# Patient Record
Sex: Male | Born: 1935
Health system: Southern US, Community
[De-identification: ages and names within clinical notes are randomized; demographics above are authoritative.]

## PROBLEM LIST (undated history)

## (undated) ENCOUNTER — Emergency Department (HOSPITAL_COMMUNITY): Discharge status: Absent without leave | Payer: Medicare Other

## (undated) DIAGNOSIS — I10 Essential (primary) hypertension: Secondary | ICD-10-CM

## (undated) DIAGNOSIS — I2510 Atherosclerotic heart disease of native coronary artery without angina pectoris: Secondary | ICD-10-CM

## (undated) DIAGNOSIS — I5022 Chronic systolic (congestive) heart failure: Secondary | ICD-10-CM

## (undated) DIAGNOSIS — I4819 Other persistent atrial fibrillation: Secondary | ICD-10-CM

## (undated) DIAGNOSIS — E785 Hyperlipidemia, unspecified: Secondary | ICD-10-CM

## (undated) DIAGNOSIS — Z9289 Personal history of other medical treatment: Secondary | ICD-10-CM

## (undated) DIAGNOSIS — J301 Allergic rhinitis due to pollen: Secondary | ICD-10-CM

## (undated) HISTORY — DX: Hyperlipidemia, unspecified: E78.5

## (undated) HISTORY — DX: Personal history of other medical treatment: Z92.89

## (undated) HISTORY — PX: CORONARY ANGIOPLASTY WITH STENT PLACEMENT: SHX49

---
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
## 2007-10-10 ENCOUNTER — Emergency Department (HOSPITAL_COMMUNITY): Admission: EM | Admit: 2007-10-10 | Discharge: 2007-10-10 | Payer: Self-pay | Admitting: Family Medicine

## 2010-12-20 ENCOUNTER — Ambulatory Visit: Payer: Self-pay | Admitting: Family Medicine

## 2011-01-04 ENCOUNTER — Ambulatory Visit (INDEPENDENT_AMBULATORY_CARE_PROVIDER_SITE_OTHER): Payer: Medicare Other | Admitting: Family Medicine

## 2011-01-04 ENCOUNTER — Encounter: Payer: Self-pay | Admitting: Family Medicine

## 2011-01-04 VITALS — BP 205/105 | HR 72 | Temp 98.0°F | Resp 12 | Ht 66.75 in | Wt 203.0 lb

## 2011-01-04 DIAGNOSIS — Z7189 Other specified counseling: Secondary | ICD-10-CM

## 2011-01-04 DIAGNOSIS — E785 Hyperlipidemia, unspecified: Secondary | ICD-10-CM

## 2011-01-04 DIAGNOSIS — R251 Tremor, unspecified: Secondary | ICD-10-CM

## 2011-01-04 DIAGNOSIS — R259 Unspecified abnormal involuntary movements: Secondary | ICD-10-CM

## 2011-01-04 DIAGNOSIS — I10 Essential (primary) hypertension: Secondary | ICD-10-CM

## 2011-01-04 LAB — LIPID PANEL
HDL: 60.8 mg/dL (ref >39.00)
Triglycerides: 75 mg/dL (ref 0.0–149.0)

## 2011-01-04 LAB — BASIC METABOLIC PANEL
CO2: 26 mEq/L (ref 19–32)
Calcium: 8.8 mg/dL (ref 8.4–10.5)
Sodium: 140 mEq/L (ref 135–145)

## 2011-01-04 LAB — TSH: TSH: 2.73 u[IU]/mL (ref 0.35–5.50)

## 2011-01-04 MED ORDER — LISINOPRIL-HYDROCHLOROTHIAZIDE 10-12.5 MG PO TABS: 1.0000 | ORAL_TABLET | Freq: Every day | ORAL | Status: DC

## 2011-01-04 NOTE — Patient Instructions (Signed)

## 2011-01-04 NOTE — Progress Notes (Signed)
  Subjective:    Patient ID: Ricardo Valencia, male    DOB: January 09, 1929, 77 y.o.   MRN: [id number]  HPI Patient is seen to establish care. Rarely sees a physician. Takes no medications. No prior surgeries other than appendectomy 1947. No known drug allergies.  Patient had fairly severe allergic reaction after exposure to hay about 2 months ago when visiting daughter. EMS was called. Had elevated blood pressure that time but never followed up. He is just now getting around to having followup. Never diagnosed with hypertension or treated for this. Denies any headaches or dizziness. No home blood pressure monitoring.  No lab work in several years apparently  History of tremor mostly involving upper extremities.  Present for several years.  Worse with fine motor skills.  ?family history of trremor.  Family history significant for father with colon cancer. Mother had breast cancer. Patient has never had colonoscopy screening. Drinks about 2-3 ounces of alcohol per night. Nonsmoker   Review of Systems  Constitutional: Negative for fever, chills, appetite change, fatigue and unexpected weight change.  HENT: Negative for congestion, sore throat, neck pain and neck stiffness.   Respiratory: Negative for cough and shortness of breath.   Cardiovascular: Negative for chest pain, palpitations and leg swelling.  Gastrointestinal: Negative for abdominal pain, diarrhea and constipation.  Genitourinary: Negative for dysuria.  Skin: Negative for rash.  Neurological: Positive for tremors. Negative for dizziness and headaches.  Hematological: Negative for adenopathy. Does not bruise/bleed easily.  Psychiatric/Behavioral: Negative for confusion and agitation.       Objective:   Physical Exam  Constitutional: He is oriented to person, place, and time. He appears well-developed and well-nourished. No distress.  HENT:  Right Ear: External ear normal.  Left Ear: External ear normal.  Mouth/Throat: Oropharynx  is clear and moist. No oropharyngeal exudate.  Eyes: Pupils are equal, round, and reactive to light.       Fundi benign. No hemorrhages or exudates  Neck: Neck supple. No thyromegaly present.  Cardiovascular: Normal rate and normal heart sounds.  Exam reveals no gallop.   Pulmonary/Chest: Effort normal and breath sounds normal. No respiratory distress. He has no wheezes. He has no rales.  Abdominal: Soft. He exhibits no distension. There is no tenderness. There is no rebound and no guarding.  Musculoskeletal: He exhibits no edema.  Lymphadenopathy:    He has no cervical adenopathy.  Neurological: He is alert and oriented to person, place, and time. No cranial nerve deficit.       Patient has resting tremor mostly upper extremities. No focal weakness  Skin: No rash noted.  Psychiatric: He has a normal mood and affect.          Assessment & Plan:  #1 new-onset hypertension. At least this has never been diagnosed previously. Start with lab work with basic metabolic panel, lipid panel, and TSH. Baseline EKG. Start lisinopril HCTZ 10/12.5 one daily and reassess 2 weeks. Dietary issues discussed. Alcohol in moderation. 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

## 2011-01-05 ENCOUNTER — Other Ambulatory Visit: Payer: Self-pay | Admitting: Family Medicine

## 2011-01-05 MED ORDER — ATORVASTATIN CALCIUM 20 MG PO TABS: 20.0000 mg | ORAL_TABLET | Freq: Every day | ORAL | Status: DC

## 2011-01-05 NOTE — Progress Notes (Signed)
Quick Note:  LMTCB, med sent to pt pharmacy ______

## 2011-01-09 NOTE — Progress Notes (Signed)
Quick Note:  Pt wife informed and she voiced her understanding. Will call for repeat lipid and hepatic panel in 6 weeks ______

## 2011-01-18 ENCOUNTER — Encounter: Payer: Self-pay | Admitting: Family Medicine

## 2011-01-18 ENCOUNTER — Ambulatory Visit (INDEPENDENT_AMBULATORY_CARE_PROVIDER_SITE_OTHER): Payer: Medicare Other | Admitting: Family Medicine

## 2011-01-18 DIAGNOSIS — E785 Hyperlipidemia, unspecified: Secondary | ICD-10-CM

## 2011-01-18 DIAGNOSIS — I10 Essential (primary) hypertension: Secondary | ICD-10-CM

## 2011-01-18 NOTE — Progress Notes (Signed)
  Subjective:    Patient ID: Ricardo Valencia, male    DOB: 1929-03-31, 77 y.o.   MRN: [id number]  HPI Patient seen for followup. Recently initiated care here with elevated blood pressure and fairly severe hyperlipidemia. Initiated Lipitor 20 mg daily and lisinopril HCTZ which he is tolerating with no side effects. Initially had some very mild lightheadedness which has resolved. No headaches or dizziness. No chest pains. No cough. Tolerating Lipitor with no myalgias. He has made diet changes with reduction of sodium and calories and has already lost about 7 pounds.   Review of Systems  Respiratory: Negative for cough and shortness of breath.   Cardiovascular: Negative for chest pain, palpitations and leg swelling.  Neurological: Negative for dizziness and headaches.       Objective:   Physical Exam  Constitutional: He appears well-developed and well-nourished.  Cardiovascular: Normal rate, regular rhythm and normal heart sounds.   Pulmonary/Chest: Effort normal and breath sounds normal. He has no wheezes. He has no rales.  Musculoskeletal: He exhibits no edema.          Assessment & Plan:  #1 hypertension improved. Not quite to goal but significant lifestyle changes. Reassess one month and titrate it if necessary. #2 hyperlipidemia. Recheck fasting lipid and hepatic at followup in one month

## 2011-02-22 ENCOUNTER — Ambulatory Visit: Payer: Medicare Other | Admitting: Family Medicine

## 2011-03-15 ENCOUNTER — Ambulatory Visit: Payer: Medicare Other | Admitting: Family Medicine

## 2011-04-05 ENCOUNTER — Ambulatory Visit: Payer: Medicare Other | Admitting: Family Medicine

## 2011-05-31 ENCOUNTER — Ambulatory Visit: Payer: Medicare Other | Admitting: Family Medicine

## 2011-07-05 ENCOUNTER — Ambulatory Visit: Payer: Medicare Other | Admitting: Family Medicine

## 2011-08-16 ENCOUNTER — Encounter: Payer: Self-pay | Admitting: Family Medicine

## 2011-08-16 ENCOUNTER — Ambulatory Visit (INDEPENDENT_AMBULATORY_CARE_PROVIDER_SITE_OTHER): Payer: Medicare Other | Admitting: Family Medicine

## 2011-08-16 VITALS — BP 160/90 | Temp 98.4°F | Wt 196.0 lb

## 2011-08-16 DIAGNOSIS — I10 Essential (primary) hypertension: Secondary | ICD-10-CM

## 2011-08-16 DIAGNOSIS — E785 Hyperlipidemia, unspecified: Secondary | ICD-10-CM

## 2011-08-16 MED ORDER — LISINOPRIL-HYDROCHLOROTHIAZIDE 10-12.5 MG PO TABS: 1.0000 | ORAL_TABLET | Freq: Every day | ORAL | Status: DC

## 2011-08-16 MED ORDER — ATORVASTATIN CALCIUM 20 MG PO TABS: 20.0000 mg | ORAL_TABLET | Freq: Every day | ORAL | Status: DC

## 2011-08-16 NOTE — Patient Instructions (Signed)
Get back on daily use of both medications. Let me know if you change your mind regarding flu vaccine.

## 2011-08-16 NOTE — Progress Notes (Signed)
  Subjective:    Patient ID: Ricardo Valencia, male    DOB: 1928-10-28, 77 y.o.   MRN: [id number]  HPI  Medical followup. Patient has not been compliant with medications. History of hyperlipidemia and hypertension. Stopped taking lisinopril HCTZ. Reportedly had some nausea though he had not reported  last visit. Has not taken in several months now. He is willing to consider repeat trial this time. Denies any cough or other side effects. Not clear why he quit taking Lipitor. Denied any side effects. No history of CAD or peripheral vascular disease. Nonsmoker. Denies any recent chest pains. Patient also takes aspirin and vitamin C. Denies any headaches or dizziness.  Past Medical History  Diagnosis Date  . Allergy     hay fever, allergies  . Hyperlipidemia    Past Surgical History  Procedure Date  . Appendectomy 1947    reports that he has never smoked. He does not have any smokeless tobacco history on file. His alcohol and drug histories not on file. family history includes Cancer in his father and mother. No Known Allergies    Review of Systems  Constitutional: Negative for fatigue.  HENT: Negative for trouble swallowing.   Eyes: Negative for visual disturbance.  Respiratory: Negative for cough, chest tightness and shortness of breath.   Cardiovascular: Negative for chest pain, palpitations and leg swelling.  Gastrointestinal: Negative for abdominal pain.  Genitourinary: Negative for dysuria.  Neurological: Negative for dizziness, syncope, weakness, light-headedness and headaches.       Objective:   Physical Exam  Constitutional: He is oriented to person, place, and time. He appears well-developed and well-nourished. No distress.  Neck: Neck supple. No thyromegaly present.  Cardiovascular: Normal rate and regular rhythm.   Pulmonary/Chest: Effort normal and breath sounds normal. No respiratory distress. He has no wheezes. He has no rales.  Musculoskeletal: He exhibits no  edema.  Neurological: He is alert and oriented to person, place, and time.  Psychiatric: He has a normal mood and affect.          Assessment & Plan:  #1 hypertension. Elevated today but patient not on medication. Get back on lisinopril HCTZ and reassess 6 weeks. If he has recurrent nausea discontinue medication and will start alternative medication #2 hyperlipidemia. Resume Lipitor 20 mg daily and repeat labs at followup  #3 health maintenance. Flu vaccine encouraged. Patient refuses.

## 2011-09-27 ENCOUNTER — Ambulatory Visit: Payer: Medicare Other | Admitting: Family Medicine

## 2011-10-18 ENCOUNTER — Ambulatory Visit: Payer: Medicare Other | Admitting: Family Medicine

## 2011-11-15 ENCOUNTER — Ambulatory Visit: Payer: Medicare Other | Admitting: Family Medicine

## 2011-12-13 ENCOUNTER — Ambulatory Visit: Payer: Medicare Other | Admitting: Family Medicine

## 2012-01-10 ENCOUNTER — Ambulatory Visit: Payer: Medicare Other | Admitting: Family Medicine

## 2012-02-14 ENCOUNTER — Ambulatory Visit: Payer: Medicare Other | Admitting: Family Medicine

## 2012-02-14 DIAGNOSIS — Z0289 Encounter for other administrative examinations: Secondary | ICD-10-CM

## 2014-01-07 ENCOUNTER — Ambulatory Visit (INDEPENDENT_AMBULATORY_CARE_PROVIDER_SITE_OTHER): Payer: Medicare Other | Admitting: Family Medicine

## 2014-01-07 DIAGNOSIS — Z23 Encounter for immunization: Secondary | ICD-10-CM

## 2014-11-07 ENCOUNTER — Inpatient Hospital Stay (HOSPITAL_COMMUNITY)
Admission: EM | Admit: 2014-11-07 | Discharge: 2014-11-12 | DRG: 246 | Disposition: A | Discharge status: Home / Self Care | Payer: Medicare Other | Attending: Cardiovascular Disease | Admitting: Cardiovascular Disease

## 2014-11-07 ENCOUNTER — Emergency Department (HOSPITAL_COMMUNITY): Payer: Medicare Other

## 2014-11-07 ENCOUNTER — Encounter (HOSPITAL_COMMUNITY): Payer: Self-pay | Admitting: *Deleted

## 2014-11-07 DIAGNOSIS — I472 Ventricular tachycardia: Secondary | ICD-10-CM | POA: Diagnosis not present

## 2014-11-07 DIAGNOSIS — Z955 Presence of coronary angioplasty implant and graft: Secondary | ICD-10-CM

## 2014-11-07 DIAGNOSIS — I4891 Unspecified atrial fibrillation: Secondary | ICD-10-CM | POA: Diagnosis not present

## 2014-11-07 DIAGNOSIS — I429 Cardiomyopathy, unspecified: Secondary | ICD-10-CM | POA: Diagnosis present

## 2014-11-07 DIAGNOSIS — J90 Pleural effusion, not elsewhere classified: Secondary | ICD-10-CM | POA: Diagnosis not present

## 2014-11-07 DIAGNOSIS — Z7982 Long term (current) use of aspirin: Secondary | ICD-10-CM

## 2014-11-07 DIAGNOSIS — R0602 Shortness of breath: Secondary | ICD-10-CM | POA: Diagnosis not present

## 2014-11-07 DIAGNOSIS — E785 Hyperlipidemia, unspecified: Secondary | ICD-10-CM | POA: Diagnosis present

## 2014-11-07 DIAGNOSIS — T82897A Other specified complication of cardiac prosthetic devices, implants and grafts, initial encounter: Secondary | ICD-10-CM | POA: Diagnosis not present

## 2014-11-07 DIAGNOSIS — I10 Essential (primary) hypertension: Secondary | ICD-10-CM | POA: Diagnosis not present

## 2014-11-07 DIAGNOSIS — I248 Other forms of acute ischemic heart disease: Secondary | ICD-10-CM | POA: Diagnosis not present

## 2014-11-07 DIAGNOSIS — I509 Heart failure, unspecified: Secondary | ICD-10-CM | POA: Diagnosis not present

## 2014-11-07 DIAGNOSIS — R7989 Other specified abnormal findings of blood chemistry: Secondary | ICD-10-CM | POA: Diagnosis not present

## 2014-11-07 DIAGNOSIS — I5021 Acute systolic (congestive) heart failure: Secondary | ICD-10-CM | POA: Diagnosis not present

## 2014-11-07 DIAGNOSIS — J811 Chronic pulmonary edema: Secondary | ICD-10-CM | POA: Diagnosis not present

## 2014-11-07 DIAGNOSIS — I340 Nonrheumatic mitral (valve) insufficiency: Secondary | ICD-10-CM | POA: Diagnosis present

## 2014-11-07 DIAGNOSIS — Y712 Prosthetic and other implants, materials and accessory cardiovascular devices associated with adverse incidents: Secondary | ICD-10-CM | POA: Diagnosis not present

## 2014-11-07 DIAGNOSIS — I2510 Atherosclerotic heart disease of native coronary artery without angina pectoris: Secondary | ICD-10-CM | POA: Diagnosis present

## 2014-11-07 DIAGNOSIS — Z9119 Patient's noncompliance with other medical treatment and regimen: Secondary | ICD-10-CM | POA: Diagnosis present

## 2014-11-07 DIAGNOSIS — Z79899 Other long term (current) drug therapy: Secondary | ICD-10-CM

## 2014-11-07 HISTORY — DX: Atherosclerotic heart disease of native coronary artery without angina pectoris: I25.10

## 2014-11-07 HISTORY — DX: Essential (primary) hypertension: I10

## 2014-11-07 LAB — CBC WITH DIFFERENTIAL/PLATELET
BASOS ABS: 0 10*3/uL (ref 0.0–0.1)
BASOS PCT: 0 % (ref 0–1)
EOS ABS: 0.1 10*3/uL (ref 0.0–0.7)
Eosinophils Relative: 2 % (ref 0–5)
HEMATOCRIT: 44.6 % (ref 39.0–52.0)
Hemoglobin: 15.1 g/dL (ref 13.0–17.0)
LYMPHS PCT: 10 % — AB (ref 12–46)
Lymphs Abs: 0.7 10*3/uL (ref 0.7–4.0)
MCH: 28.9 pg (ref 26.0–34.0)
MCHC: 33.9 g/dL (ref 30.0–36.0)
MCV: 85.4 fL (ref 78.0–100.0)
MONO ABS: 0.4 10*3/uL (ref 0.1–1.0)
MONOS PCT: 5 % (ref 3–12)
NEUTROS ABS: 5.5 10*3/uL (ref 1.7–7.7)
Neutrophils Relative %: 83 % — ABNORMAL HIGH (ref 43–77)
Platelets: 143 10*3/uL — ABNORMAL LOW (ref 150–400)
RBC: 5.22 MIL/uL (ref 4.22–5.81)
RDW: 14.4 % (ref 11.5–15.5)
WBC: 6.6 10*3/uL (ref 4.0–10.5)

## 2014-11-07 LAB — BASIC METABOLIC PANEL
ANION GAP: 7 (ref 5–15)
BUN: 8 mg/dL (ref 6–23)
CO2: 26 mmol/L (ref 19–32)
Calcium: 8.5 mg/dL (ref 8.4–10.5)
Chloride: 106 mmol/L (ref 96–112)
Creatinine, Ser: 1.24 mg/dL (ref 0.50–1.35)
GFR calc Af Amer: 60 mL/min — ABNORMAL LOW (ref >90)
GFR, EST NON AFRICAN AMERICAN: 52 mL/min — AB (ref >90)
GLUCOSE: 107 mg/dL — AB (ref 70–99)
POTASSIUM: 4 mmol/L (ref 3.5–5.1)
SODIUM: 139 mmol/L (ref 135–145)

## 2014-11-07 LAB — TROPONIN I
TROPONIN I: 0.06 ng/mL — AB (ref <0.031)
Troponin I: 0.08 ng/mL — ABNORMAL HIGH (ref <0.031)
Troponin I: 0.08 ng/mL — ABNORMAL HIGH (ref <0.031)

## 2014-11-07 LAB — TSH: TSH: 3.733 u[IU]/mL (ref 0.350–4.500)

## 2014-11-07 LAB — MAGNESIUM: Magnesium: 2 mg/dL (ref 1.5–2.5)

## 2014-11-07 LAB — HEPARIN LEVEL (UNFRACTIONATED): Heparin Unfractionated: 0.36 IU/mL (ref 0.30–0.70)

## 2014-11-07 LAB — APTT: APTT: 32 s (ref 24–37)

## 2014-11-07 LAB — PROTIME-INR
INR: 1.29 (ref 0.00–1.49)
PROTHROMBIN TIME: 16.2 s — AB (ref 11.6–15.2)

## 2014-11-07 LAB — BRAIN NATRIURETIC PEPTIDE: B Natriuretic Peptide: 464.5 pg/mL — ABNORMAL HIGH (ref 0.0–100.0)

## 2014-11-07 MED ORDER — VITAMIN C 250 MG PO TABS
250.0000 mg | ORAL_TABLET | Freq: Every day | ORAL | Status: DC
  Administered 2014-11-07 – 2014-11-12 (×6): 250 mg via ORAL
  Filled 2014-11-07 (×6): qty 1

## 2014-11-07 MED ORDER — VITAMIN C 100 MG PO TABS: 100.0000 mg | ORAL_TABLET | Freq: Every day | ORAL | Status: DC

## 2014-11-07 MED ORDER — DILTIAZEM HCL 25 MG/5ML IV SOLN
10.0000 mg | Freq: Once | INTRAVENOUS | Status: DC
  Filled 2014-11-07: qty 5

## 2014-11-07 MED ORDER — DILTIAZEM HCL 100 MG IV SOLR
5.0000 mg/h | INTRAVENOUS | Status: DC
  Administered 2014-11-07: 5 mg/h via INTRAVENOUS
  Filled 2014-11-07: qty 100

## 2014-11-07 MED ORDER — DILTIAZEM LOAD VIA INFUSION
10.0000 mg | Freq: Once | INTRAVENOUS | Status: AC
  Administered 2014-11-07: 10 mg via INTRAVENOUS
  Filled 2014-11-07: qty 10

## 2014-11-07 MED ORDER — HEPARIN (PORCINE) IN NACL 100-0.45 UNIT/ML-% IJ SOLN
1150.0000 [IU]/h | INTRAMUSCULAR | Status: DC
  Administered 2014-11-07: 1150 [IU]/h via INTRAVENOUS
  Filled 2014-11-07 (×2): qty 250

## 2014-11-07 MED ORDER — ONDANSETRON HCL 4 MG/2ML IJ SOLN
4.0000 mg | Freq: Four times a day (QID) | INTRAMUSCULAR | Status: DC | PRN
  Administered 2014-11-09: 4 mg via INTRAVENOUS
  Filled 2014-11-07: qty 2

## 2014-11-07 MED ORDER — DILTIAZEM HCL 60 MG PO TABS
60.0000 mg | ORAL_TABLET | Freq: Four times a day (QID) | ORAL | Status: DC
  Administered 2014-11-07 – 2014-11-08 (×3): 60 mg via ORAL
  Filled 2014-11-07 (×7): qty 1

## 2014-11-07 MED ORDER — HEPARIN BOLUS VIA INFUSION
4000.0000 [IU] | Freq: Once | INTRAVENOUS | Status: AC
  Administered 2014-11-07: 4000 [IU] via INTRAVENOUS
  Filled 2014-11-07: qty 4000

## 2014-11-07 MED ORDER — ACETAMINOPHEN 325 MG PO TABS: 650.0000 mg | ORAL_TABLET | ORAL | Status: DC | PRN

## 2014-11-07 MED ORDER — FUROSEMIDE 10 MG/ML IJ SOLN
20.0000 mg | Freq: Once | INTRAMUSCULAR | Status: AC
  Administered 2014-11-07: 20 mg via INTRAVENOUS
  Filled 2014-11-07: qty 2

## 2014-11-07 NOTE — ED Provider Notes (Signed)
Medical screening examination/treatment/procedure(s) were conducted as a shared visit with non-physician practitioner(s) and myself.  I personally evaluated the patient during the encounter.   EKG Interpretation   Date/Time:  Sunday November 07 2014 06:50:21 EST Ventricular Rate:  128 PR Interval:    QRS Duration: 95 QT Interval:  339 QTC Calculation: 495 R Axis:   98 Text Interpretation:  Age not entered, assumed to be  81 years old for  purpose of ECG interpretation Atrial fibrillation Borderline right axis  deviation Borderline low voltage, extremity leads Consider anterior  infarct No old tracing to compare Confirmed by Addilyne Backs,  DO, Careem Yasui ([id number])  on 11/07/2014 6:54:13 AM      Pt is a 81 y.o. male with history of hypertension, hyperlipidemia who presents to the emergency department shortness of breath that started yesterday worse with exertion worse with lying flat. Patient denies chest pain or chest discomfort. No fevers or cough. No lower extremity swelling or pain. On exam, patient is tachycardic, irregularly irregular, lung sounds are clear, abdomen soft, no lower extremity swelling or pain. His prior history of PE or DVT. No history of atrial fibrillation. States he is supposed to be a blood pressure medication has taken himself off. He is not anticoagulation. EKG shows atrial fibrillation with RVR.  He is also hypertensive. We'll obtain cardiac workup, chest x-ray. We'll start diltiazem drip and give aspirin. Anticipate admission.  [place], DO 11/07/14 [phone]

## 2014-11-07 NOTE — ED Notes (Addendum)
PA Tysinger at bedside with the patient.    Breakfast tray ordered for patient.

## 2014-11-07 NOTE — Progress Notes (Signed)
ANTICOAGULATION CONSULT NOTE - Initial Consult  Pharmacy Consult for Heparin Indication: atrial fibrillation  No Known Allergies  Patient Measurements: Height: 5\' 9"  (175.3 cm) Weight: 185 lb (83.915 kg) IBW/kg (Calculated) : 70.7  Vital Signs: Temp: 98.5 F (36.9 C) (02/21 0653) Temp Source: Oral (02/21 0653) BP: 106/82 mmHg (02/21 0915) Pulse Rate: 109 (02/21 0900)  Labs:  Recent Labs  11/07/14 0707  HGB 15.1  HCT 44.6  PLT 143*  CREATININE 1.24  TROPONINI 0.06*    Estimated Creatinine Clearance: 45.1 mL/min (by C-G formula based on Cr of 1.24).   Medical History: Past Medical History  Diagnosis Date  . Allergy     hay fever, allergies  . Hyperlipidemia   . Hypertension     Medications:  Infusions:  . diltiazem (CARDIZEM) infusion 15 mg/hr (11/07/14 0834)    Assessment: 81 year old male admitted with atrial fibrillation to begin anticoagulation with Heparin.  He is not taking any medications prior to admission.  Note his CHADS2-VASc score is 3 or 4 (depending on his EF) so I anticipate he will require long-term anticoagulation.  Goal of Therapy:  Heparin level 0.3-0.7 units/ml Monitor platelets by anticoagulation protocol: Yes   Plan:  Heparin 4000 units IV bolus Start Heparin infusion at 1150 units/hr Check heparin level in 8 hours Daily Heparin level and CBC Follow for long-term anticoagulation plans  Legrand Como, Pharm.D., BCPS, AAHIVP Clinical Pharmacist Phone: [phone] or [phone] 11/07/2014, 9:23 AM

## 2014-11-07 NOTE — Progress Notes (Signed)
Pharmacy: Re- heparin  Patient is an 81 y.o M on heparin for Afib.  First heparin level now back therapeutic at 0.36 (goal 0.3-0.7).  No bleeding documented.  Plan: - continue  Heparin drip at 1150 units/hr - f/u with AM labs and adjust rate if needed  Dia Sitter, PharmD, BCPS

## 2014-11-07 NOTE — ED Notes (Signed)
Pt arrived by [facility]. Reports sob since yesterday, had gotten progressively worse, difficulty lying down last night. afib rvr pta, rate 120-160. Given 324mg  asa pta.

## 2014-11-07 NOTE — H&P (Signed)
Patient ID: Ricardo Valencia MRN: [id number], DOB/AGE: 79-05-1929   Admit date: 11/07/2014   Primary Physician: Eulas Post, MD Primary Cardiologist: New  Pt. Profile:  Ricardo ARGUIJO is a 81 y.o. male with a history of HTN and HLD who presented to [facility] ED today with SOB and found to be in new onset afib with RVR.   Patient has a history of noncompliance and has not been seen by PCP and a couple years. He is not taking any medicines currently. He was in his usual state of health until yesterday when he started to feel mildly short of breath. This became worse and last night he could not sleep at all due to orthopnea and PND.  He also admits to fatigue and mild dizziness. No lower extremity swelling or chest pain. He has no history of exertional chest pain or shortness of breath. No family history of coronary artery disease. He has never been seen by cardiologist before.  In the emergency department he was noted to be in A. fib with RVR and started on diltiazem drip. CXR with pulm edema. BNP ~400s and troponin 0.06.    Problem List  Past Medical History  Diagnosis Date  . Allergy     hay fever, allergies  . Hyperlipidemia   . Hypertension     Past Surgical History  Procedure Laterality Date  . Appendectomy  1947     Allergies  No Known Allergies   Home Medications  Prior to Admission medications   Medication Sig Start Date End Date Taking? Authorizing Provider  Ascorbic Acid (VITAMIN C) 100 MG tablet Take 100 mg by mouth daily.     Yes Historical Provider, MD  aspirin 325 MG tablet Take 325 mg by mouth daily.     Yes Historical Provider, MD  atorvastatin (LIPITOR) 20 MG tablet Take 1 tablet (20 mg total) by mouth daily. Patient not taking: Reported on 11/07/2014 08/16/11 08/15/12  Eulas Post, MD  lisinopril-hydrochlorothiazide (PRINZIDE,ZESTORETIC) 10-12.5 MG per tablet Take 1 tablet by mouth daily. Patient not taking: Reported on 11/07/2014 08/16/11  08/15/12  Eulas Post, MD    Family History  Family History  Problem Relation Age of Onset  . Cancer Mother     breast  . Cancer Father     colon   Family Status  Relation Status Death Age  . Mother Deceased   . Father Deceased      Social History  History   Social History  . Marital Status: Married    Spouse Name: N/A  . Number of Children: N/A  . Years of Education: N/A   Occupational History  . Not on file.   Social History Main Topics  . Smoking status: Never Smoker   . Smokeless tobacco: Not on file  . Alcohol Use: Not on file  . Drug Use: Not on file  . Sexual Activity: Not on file   Other Topics Concern  . Not on file   Social History Narrative     All other systems reviewed and are otherwise negative except as noted above.  Physical Exam  Blood pressure 147/97, pulse 111, temperature 98.5 F (36.9 C), temperature source Oral, resp. rate 18, height 5\' 9"  (1.753 m), weight 185 lb (83.915 kg), SpO2 97 %.  General: Pleasant, NAD Psych: Normal affect. Neuro: Alert and oriented X 3. Moves all extremities spontaneously. HEENT: Normal  Neck: Supple without bruits or JVD. Lungs:  Mild crackles at bases  Heart: irreg irreg, tachy no s3, s4, or murmurs. Abdomen: Soft, non-tender, non-distended, BS + x 4.  Extremities: No clubbing, cyanosis or edema. DP/PT/Radials 2+ and equal bilaterally.  Labs   Recent Labs  11/07/14 0707  TROPONINI 0.06*   Lab Results  Component Value Date   WBC 6.6 11/07/2014   HGB 15.1 11/07/2014   HCT 44.6 11/07/2014   MCV 85.4 11/07/2014   PLT 143* 11/07/2014    Recent Labs Lab 11/07/14 0707  NA 139  K 4.0  CL 106  CO2 26  BUN 8  CREATININE 1.24  CALCIUM 8.5  GLUCOSE 107*     Radiology/Studies  Dg Chest 2 View  11/07/2014   CLINICAL DATA:  Hypertension.  Worsening shortness of Breath  EXAM: CHEST  2 VIEW  COMPARISON:  None.  FINDINGS: There is mild cardiac enlargement. Small bilateral pleural  effusions and mild interstitial edema is identified. Lung volumes are low.  IMPRESSION: Mild CHF.   Electronically Signed   By: Kerby Moors M.D.   On: 11/07/2014 07:57    ECG  HR 128 Atrial fibrillation Borderline right axis deviation Borderline low voltage, extremity leads  ASSESSMENT AND PLAN  Ricardo Valencia is a 81 y.o. male with a history of HTN and HLD who presented to [facility] ED today with SOB and found to be in new onset afib with RVR.   New onset afib with RVR- rates better controlled on dilt gtt -- Will start heparin gtt per pharmacy. He will require long term AC as his CHADSVASc score is at least 3 (HTN 1 age 58). Will order 2D ECHO to evaluated for CHF.  -- Hopefully he will spontaneously convert, but may need to consider TEE/DCCV if he remains symptomatic despite rate control .  -- Will order a TSH  Acute CHF- likely due to afib with RVR -- BNP slightly elevated at 465. CXR with mild CHF. Patient with SOB, orthopnea and PND. Will give one dose of 20mg  IV lasix and reassess later today.  -- Strict I/Os -- 2d ECHO when his HR is under control   Elevated troponin- mild. 0.06. Continue to cycle enzymes. -- Likely demand ischemia in the setting of afib with RVR but he does have RFs for CAD with HTN and HLD. May need ischemic work up at some point ( outpatient nuclear stress test)   HLD- will order a lipid panel   HTN- not on any meds at home. Continue dilt gtt for rate control .  Signed, Eileen Stanford, PA-C 11/07/2014, 8:30 AM  Pager [phone]  The patient was seen and examined, and I agree with the assessment and plan as documented above. 81 yr old male admitted with increasing dyspnea, orthopnea, and PND found to be in rapid atrial fibrillation. No known h/o heart or thyroid disease. Originally from [place] and moved to [place] 25 years ago. Currently works part time for [employer]. Now on diltiazem drip with rate in 90-110 bpm range. Will start  heparin and will eventually need factor Xa inhibitor. Obtain echocardiogram once HR is more optimized so as to assess LV systolic function, regional wall motion, and LA size. Consider TEE/DCCV if unable to control symptoms in spite of HR control.  Will give one time IV Lasix 20 and reassess.

## 2014-11-07 NOTE — ED Notes (Signed)
Pharmacy Tech at bedside with patient and family.

## 2014-11-07 NOTE — Progress Notes (Addendum)
    Was placed on dilt gtt at 60 in the ED. Dilt gtt ran out. Patient still in afib but HR in low 70s. WIll convert to po dilt 60mg  q6 for now.   Angelena Form PA-C  [facility]

## 2014-11-07 NOTE — ED Provider Notes (Signed)
CSN: [id number]     Arrival date & time 11/07/14  2683 History   First MD Initiated Contact with Patient 11/07/14 [phone]     Chief Complaint  Patient presents with  . Shortness of Breath   (Consider location/radiation/quality/duration/timing/severity/associated sxs/prior Treatment) HPI Ricardo Valencia is a 81 year old male presenting with report of shortness of breath. He states this first started yesterday morning he noticed he was more short of breath and this progressively worsened over the last 2 days. He states he originally thought shortness of breath was related to his musculoskeletal right arm pain that started after some strenuous yard work earlier in the week. Shortness of breath became worse last night when he was trying to sleep and the patient could not get comfortable or rest because of his difficulty breathing. He has a history of high blood pressure but has been avoiding regular medical checkups and has blood pressure medicines. EMS gave 324 ASA en route. He denies any chest pain, palpitations, diaphoresis, fever, or cough.   Past Medical History  Diagnosis Date  . Allergy     hay fever, allergies  . Hyperlipidemia   . Hypertension    Past Surgical History  Procedure Laterality Date  . Appendectomy  1947   Family History  Problem Relation Age of Onset  . Cancer Mother     breast  . Cancer Father     colon   History  Substance Use Topics  . Smoking status: Never Smoker   . Smokeless tobacco: Not on file  . Alcohol Use: Not on file    Review of Systems  Constitutional: Negative for fever and chills.  HENT: Negative for sore throat.   Eyes: Negative for visual disturbance.  Respiratory: Positive for shortness of breath. Negative for cough.   Cardiovascular: Negative for chest pain and leg swelling.  Gastrointestinal: Negative for nausea, vomiting and diarrhea.  Genitourinary: Negative for dysuria.  Musculoskeletal: Negative for myalgias.  Skin: Negative for  rash.  Neurological: Negative for weakness, numbness and headaches.    Allergies  Review of patient's allergies indicates no known allergies.  Home Medications   Prior to Admission medications   Medication Sig Start Date End Date Taking? Authorizing Provider  Ascorbic Acid (VITAMIN C) 100 MG tablet Take 100 mg by mouth daily.      Historical Provider, MD  aspirin 325 MG tablet Take 325 mg by mouth daily.      Historical Provider, MD  atorvastatin (LIPITOR) 20 MG tablet Take 1 tablet (20 mg total) by mouth daily. 08/16/11 08/15/12  Eulas Post, MD  lisinopril-hydrochlorothiazide (PRINZIDE,ZESTORETIC) 10-12.5 MG per tablet Take 1 tablet by mouth daily. 08/16/11 08/15/12  Eulas Post, MD   BP 164/100 mmHg  Pulse 123  Temp(Src) 98.5 F (36.9 C) (Oral)  Resp 27  SpO2 99% Physical Exam  Constitutional: He is oriented to person, place, and time. He appears well-developed and well-nourished. No distress.  HENT:  Head: Normocephalic and atraumatic.  Mouth/Throat: Oropharynx is clear and moist. No oropharyngeal exudate.  Eyes: Conjunctivae are normal.  Neck: Neck supple.  Cardiovascular: S1 normal, S2 normal and intact distal pulses.  An irregular rhythm present. Tachycardia present.  Exam reveals no gallop and no friction rub.   No murmur heard. Pulmonary/Chest: Effort normal. No respiratory distress. He has decreased breath sounds in the right lower field and the left lower field. He has no wheezes. He has no rhonchi. He has no rales. He exhibits no tenderness.  Abdominal:  Soft. There is no tenderness.  Musculoskeletal: He exhibits no tenderness.  Lymphadenopathy:    He has no cervical adenopathy.  Neurological: He is alert and oriented to person, place, and time. No cranial nerve deficit.  Skin: Skin is warm and dry. No rash noted. He is not diaphoretic.  Psychiatric: He has a normal mood and affect.  Nursing note and vitals reviewed.   ED Course  Procedures (including  critical care time) CRITICAL CARE Performed by: Britt Bottom   Total critical care time: 35 min  Critical care time was exclusive of separately billable procedures and treating other patients.  Critical care was necessary to treat or prevent imminent or life-threatening deterioration.  Critical care was time spent personally by me on the following activities: development of treatment plan with patient and/or surrogate as well as nursing, discussions with consultants, evaluation of patient's response to treatment, examination of patient, obtaining history from patient or surrogate, ordering and performing treatments and interventions, ordering and review of laboratory studies, ordering and review of radiographic studies, pulse oximetry and re-evaluation of patient's condition.   Labs Review Labs Reviewed  CBC WITH DIFFERENTIAL/PLATELET - Abnormal; Notable for the following:    Platelets 143 (*)    Neutrophils Relative % 83 (*)    Lymphocytes Relative 10 (*)    All other components within normal limits  BASIC METABOLIC PANEL - Abnormal; Notable for the following:    Glucose, Bld 107 (*)    GFR calc non Af Amer 52 (*)    GFR calc Af Amer 60 (*)    All other components within normal limits  TROPONIN I - Abnormal; Notable for the following:    Troponin I 0.06 (*)    All other components within normal limits  BRAIN NATRIURETIC PEPTIDE - Abnormal; Notable for the following:    B Natriuretic Peptide 464.5 (*)    All other components within normal limits    Imaging Review Dg Chest 2 View  11/07/2014   CLINICAL DATA:  Hypertension.  Worsening shortness of Breath  EXAM: CHEST  2 VIEW  COMPARISON:  None.  FINDINGS: There is mild cardiac enlargement. Small bilateral pleural effusions and mild interstitial edema is identified. Lung volumes are low.  IMPRESSION: Mild CHF.   Electronically Signed   By: Kerby Moors M.D.   On: 11/07/2014 07:57     EKG Interpretation   Date/Time:   Sunday November 07 2014 06:50:21 EST Ventricular Rate:  128 PR Interval:    QRS Duration: 95 QT Interval:  339 QTC Calculation: 495 R Axis:   98 Text Interpretation:  Age not entered, assumed to be  81 years old for  purpose of ECG interpretation Atrial fibrillation Borderline right axis  deviation Borderline low voltage, extremity leads Consider anterior  infarct No old tracing to compare Confirmed by WARD,  DO, KRISTEN ([id number])  on 11/07/2014 6:54:13 AM      MDM   Final diagnoses:  SOB (shortness of breath)  Atrial fibrillation with RVR   81 yo male with history of hypertension and hyperlipidemia, presenting with shortness of breath and afib with RVR on exam.  Discussed case with Dr. Leonides Schanz.  CBC, BMP, BNP, Troponin, EKG, CXR, Diltiazem bolus and gtt.    7:20 AM: On re-eval, pt reports shortness of breath has improved and he appears to be resting comfortably.  Heart rate is responding to diltiazem and is currently in low 100s.    8:00 AM: Labs and imaging reviewed: Troponin and BNP  elevated to 0.06 and 464.5 respectively.  CXR shows mild CHF.  Discussed all findings and plan with pt and family.    8:05 Consulted Dr. Bronson Ing (cardiology) regarding pt status for admission.  The patient appears reasonably stabilized for admission considering the current resources, flow, and capabilities available in the ED at this time, and I doubt any further screening and/or treatment in the ED prior to admission.    Filed Vitals:   11/07/14 0830 11/07/14 0845 11/07/14 0900 11/07/14 0915  BP: 132/94 149/86 147/81 106/82  Pulse: 95  109   Temp:      TempSrc:      Resp: 20 15 22 21   Height:      Weight:      SpO2: 98% 99% 98% 98%   Meds given in ED:  Medications  diltiazem (CARDIZEM) 1 mg/mL load via infusion 10 mg (10 mg Intravenous New Bag/Given 11/07/14 0703)    And  diltiazem (CARDIZEM) 100 mg in dextrose 5 % 100 mL (1 mg/mL) infusion (5 mg/hr Intravenous New Bag/Given 11/07/14 0702)     New Prescriptions   No medications on file       Britt Bottom, NP 11/07/14 1941

## 2014-11-08 DIAGNOSIS — I5031 Acute diastolic (congestive) heart failure: Secondary | ICD-10-CM | POA: Diagnosis not present

## 2014-11-08 DIAGNOSIS — I10 Essential (primary) hypertension: Secondary | ICD-10-CM | POA: Diagnosis not present

## 2014-11-08 DIAGNOSIS — I4891 Unspecified atrial fibrillation: Secondary | ICD-10-CM

## 2014-11-08 LAB — BASIC METABOLIC PANEL
ANION GAP: 10 (ref 5–15)
BUN: 8 mg/dL (ref 6–23)
CO2: 22 mmol/L (ref 19–32)
CREATININE: 1.07 mg/dL (ref 0.50–1.35)
Calcium: 8.1 mg/dL — ABNORMAL LOW (ref 8.4–10.5)
Chloride: 105 mmol/L (ref 96–112)
GFR calc Af Amer: 72 mL/min — ABNORMAL LOW (ref >90)
GFR calc non Af Amer: 62 mL/min — ABNORMAL LOW (ref >90)
Glucose, Bld: 104 mg/dL — ABNORMAL HIGH (ref 70–99)
Potassium: 3.6 mmol/L (ref 3.5–5.1)
Sodium: 137 mmol/L (ref 135–145)

## 2014-11-08 LAB — CBC
HCT: 40.1 % (ref 39.0–52.0)
Hemoglobin: 13.5 g/dL (ref 13.0–17.0)
MCH: 28.6 pg (ref 26.0–34.0)
MCHC: 33.7 g/dL (ref 30.0–36.0)
MCV: 85 fL (ref 78.0–100.0)
PLATELETS: 142 10*3/uL — AB (ref 150–400)
RBC: 4.72 MIL/uL (ref 4.22–5.81)
RDW: 14.5 % (ref 11.5–15.5)
WBC: 6.3 10*3/uL (ref 4.0–10.5)

## 2014-11-08 LAB — PROTIME-INR
INR: 1.32 (ref 0.00–1.49)
Prothrombin Time: 16.5 seconds — ABNORMAL HIGH (ref 11.6–15.2)

## 2014-11-08 LAB — LIPID PANEL
CHOL/HDL RATIO: 3.6 ratio
CHOLESTEROL: 195 mg/dL (ref 0–200)
HDL: 54 mg/dL (ref >39)
LDL CALC: 130 mg/dL — AB (ref 0–99)
Triglycerides: 54 mg/dL (ref <150)
VLDL: 11 mg/dL (ref 0–40)

## 2014-11-08 LAB — HEMOGLOBIN A1C
Hgb A1c MFr Bld: 5.4 % (ref 4.8–5.6)
Mean Plasma Glucose: 108 mg/dL

## 2014-11-08 LAB — TROPONIN I: Troponin I: 0.09 ng/mL — ABNORMAL HIGH (ref <0.031)

## 2014-11-08 LAB — HEPARIN LEVEL (UNFRACTIONATED): Heparin Unfractionated: 0.38 IU/mL (ref 0.30–0.70)

## 2014-11-08 MED ORDER — DILTIAZEM HCL ER COATED BEADS 240 MG PO CP24
240.0000 mg | ORAL_CAPSULE | Freq: Every day | ORAL | Status: DC
  Administered 2014-11-08 – 2014-11-12 (×5): 240 mg via ORAL
  Filled 2014-11-08 (×5): qty 1

## 2014-11-08 MED ORDER — APIXABAN 5 MG PO TABS
5.0000 mg | ORAL_TABLET | Freq: Two times a day (BID) | ORAL | Status: DC
  Administered 2014-11-08 (×2): 5 mg via ORAL
  Filled 2014-11-08 (×4): qty 1

## 2014-11-08 MED ORDER — FUROSEMIDE 10 MG/ML IJ SOLN
20.0000 mg | Freq: Once | INTRAMUSCULAR | Status: AC
  Administered 2014-11-08: 20 mg via INTRAVENOUS
  Filled 2014-11-08: qty 2

## 2014-11-08 MED ORDER — ATORVASTATIN CALCIUM 40 MG PO TABS
40.0000 mg | ORAL_TABLET | Freq: Every day | ORAL | Status: DC
  Administered 2014-11-09 – 2014-11-11 (×2): 40 mg via ORAL
  Filled 2014-11-08 (×5): qty 1

## 2014-11-08 MED ORDER — METOPROLOL SUCCINATE ER 25 MG PO TB24
25.0000 mg | ORAL_TABLET | Freq: Every day | ORAL | Status: DC
  Administered 2014-11-08 – 2014-11-09 (×2): 25 mg via ORAL
  Filled 2014-11-08 (×2): qty 1

## 2014-11-08 NOTE — Progress Notes (Signed)
Patient Profile: Ricardo Valencia is a 81 y.o. male with a history of HTN and HLD who presented to [facility] ED 11/07/14 with SOB and found to be in new onset afib with RVR and acute CHF. CHA2DS2 VASc score of 3 (HTN, Age > 75).    Subjective: Symptoms improved but still using supplemental O2 via [place].   Objective: Vital signs in last 24 hours: Temp:  [97.6 F (36.4 C)-98.4 F (36.9 C)] 98.1 F (36.7 C) (02/22 0603) Pulse Rate:  [61-123] 99 (02/22 0603) Resp:  [15-23] 20 (02/22 0603) BP: (106-149)/(57-97) 142/94 mmHg (02/22 0603) SpO2:  [97 %-99 %] 99 % (02/22 0603) Weight:  [184 lb 15.5 oz (83.9 kg)-201 lb 12.8 oz (91.536 kg)] 201 lb 12.8 oz (91.536 kg) (02/22 0603) Last BM Date: 11/07/14  Intake/Output from previous day: 02/21 0701 - 02/22 0700 In: 360 [P.O.:360] Out: 2050 [Urine:2050] Intake/Output this shift:    Medications Current Facility-Administered Medications  Medication Dose Route Frequency Provider Last Rate Last Dose  . acetaminophen (TYLENOL) tablet 650 mg  650 mg Oral Q4H PRN Eileen Stanford, PA-C      . diltiazem (CARDIZEM) tablet 60 mg  60 mg Oral 4 times per day Eileen Stanford, PA-C   60 mg at 11/08/14 0370  . heparin ADULT infusion 100 units/mL (25000 units/250 mL)  1,150 Units/hr Intravenous Continuous Norva Riffle, RPH 11.5 mL/hr at 11/07/14 1007 1,150 Units/hr at 11/07/14 1007  . ondansetron (ZOFRAN) injection 4 mg  4 mg Intravenous Q6H PRN Eileen Stanford, PA-C      . vitamin C (ASCORBIC ACID) tablet 250 mg  250 mg Oral Daily Herminio Commons, MD   250 mg at 11/07/14 1501    PE: General appearance: alert, cooperative and no distress Neck: no carotid bruit and no JVD Lungs: right sided basilar rales present Heart: irregularly irregular rhythm Extremities: trace bilateral LEE Pulses: 2+ and symmetric Skin: warm and dry Neurologic: Grossly normal  Lab Results:   Recent Labs  11/07/14 0707 11/08/14 0410  WBC 6.6 6.3  HGB 15.1 13.5    HCT 44.6 40.1  PLT 143* 142*   BMET  Recent Labs  11/07/14 0707 11/08/14 0410  NA 139 137  K 4.0 3.6  CL 106 105  CO2 26 22  GLUCOSE 107* 104*  BUN 8 8  CREATININE 1.24 1.07  CALCIUM 8.5 8.1*   PT/INR  Recent Labs  11/07/14 0707 11/08/14 0410  LABPROT 16.2* 16.5*  INR 1.29 1.32   Cholesterol  Recent Labs  11/08/14 0500  CHOL 195   Cardiac Panel (last 3 results)  Recent Labs  11/07/14 1622 11/07/14 2235 11/08/14 0410  TROPONINI 0.08* 0.08* 0.09*   Lipid Panel     Component Value Date/Time   CHOL 195 11/08/2014 0500   TRIG 54 11/08/2014 0500   HDL 54 11/08/2014 0500   CHOLHDL 3.6 11/08/2014 0500   VLDL 11 11/08/2014 0500   LDLCALC 130* 11/08/2014 0500   LDLDIRECT 191.1 01/04/2011 1218    Studies/Results: 2D echo- pending   Assessment/Plan  Active Problems:   Atrial fibrillation with rapid ventricular response  1. New onset afib with RVR- still in Afib but rate is better controlled in the 90s. --Continue PO Cardizem, 60 mg Q6H -- On IV heparin. He will require long term AC as his CHADSVASc score is at least 3 (HTN 1 age 7). Convert to NOAC today. Eliquis 5 mg BID may be best option. Scr is <1.5 and  weight is > 60 kg -- Plan on possible TEE Cardioversion this admission vs OP cardioversion after 3 weeks of uninterrupted anticoagulation, if no spontaneous conversion to NSR --2D echo pending --ACS less likely with flat troponin trend and no h/o chest pain --TSH normal    2. Acute CHF- likely due to afib with RVR -- BNP slightly elevated at 465. CXR with mild CHF. Patient with SOB, orthopnea and PND on admission. Was given 20 mg IV Lasix x 1. Good diuresis -2L out. Net negative -1.69. Symptoms improved but still with extra fluid with pulmonic rales and trace LEE.  -- Will give another dose of 20 mg IV Lasix x 1. Renal function, BP and electrolytes are ok. -- Strict I/Os -- 2d ECHO pending to assess LVF.    3. Elevated troponin- mild w/ flat  trend. 0.08 -->0.08--> 0.09 -- Likely demand ischemia in the setting of afib with RVR but he does have RFs for CAD with HTN and HLD. May need ischemic work up at some point (outpatient nuclear stress test)   4. HLD- fasting lipid shows elevated LDL of 130.  --Start Lipitor for LDL reduction.  --Check LFTs --Repeat fasting lipid and LFTs in 6 weeks  5. HTN- moderately elevated in the 364W systolic -- continue PO Cardizem -- Will possibly add a second agent. Choice will depend on echo finds.   [name] 11/08/2014 8:02 AM  I have personally seen and examined this patient with Lyda Jester, PA-CI agree with the assessment and plan as outlined above. He has new onset atrial fibrillation with uncertain duration. He is on heparin. Given CHADSVASC 3, will need long term anticoagulation. Will stop heparin drip today and start Eliquis 5 mg po BID. Rate is controlled with Cardizem. Will change to Cardizem CD 240 mg po Qdaily today and add Toprol 25 mg po Qdaily. Echo later today. IV Lasix for diuresis. Will likely plan 4 weeks of anticoagulation before DCCV as he seems to be symptomatically improved with rate control.   MCALHANY,CHRISTOPHER 11/08/2014 9:32 AM

## 2014-11-08 NOTE — Progress Notes (Signed)
  Echocardiogram 2D Echocardiogram has been performed.  Ricardo Valencia 11/08/2014, 11:55 AM

## 2014-11-08 NOTE — Progress Notes (Signed)
UR completed 

## 2014-11-09 DIAGNOSIS — I429 Cardiomyopathy, unspecified: Secondary | ICD-10-CM | POA: Diagnosis present

## 2014-11-09 DIAGNOSIS — I472 Ventricular tachycardia: Secondary | ICD-10-CM | POA: Diagnosis not present

## 2014-11-09 DIAGNOSIS — R7989 Other specified abnormal findings of blood chemistry: Secondary | ICD-10-CM | POA: Diagnosis not present

## 2014-11-09 DIAGNOSIS — I25119 Atherosclerotic heart disease of native coronary artery with unspecified angina pectoris: Secondary | ICD-10-CM | POA: Diagnosis not present

## 2014-11-09 DIAGNOSIS — Z7982 Long term (current) use of aspirin: Secondary | ICD-10-CM | POA: Diagnosis not present

## 2014-11-09 DIAGNOSIS — Z79899 Other long term (current) drug therapy: Secondary | ICD-10-CM | POA: Diagnosis not present

## 2014-11-09 DIAGNOSIS — I25110 Atherosclerotic heart disease of native coronary artery with unstable angina pectoris: Secondary | ICD-10-CM | POA: Diagnosis not present

## 2014-11-09 DIAGNOSIS — I420 Dilated cardiomyopathy: Secondary | ICD-10-CM | POA: Diagnosis not present

## 2014-11-09 DIAGNOSIS — T82897A Other specified complication of cardiac prosthetic devices, implants and grafts, initial encounter: Secondary | ICD-10-CM | POA: Diagnosis not present

## 2014-11-09 DIAGNOSIS — I2510 Atherosclerotic heart disease of native coronary artery without angina pectoris: Secondary | ICD-10-CM | POA: Diagnosis present

## 2014-11-09 DIAGNOSIS — I340 Nonrheumatic mitral (valve) insufficiency: Secondary | ICD-10-CM | POA: Diagnosis present

## 2014-11-09 DIAGNOSIS — E785 Hyperlipidemia, unspecified: Secondary | ICD-10-CM | POA: Diagnosis not present

## 2014-11-09 DIAGNOSIS — I5021 Acute systolic (congestive) heart failure: Secondary | ICD-10-CM | POA: Diagnosis not present

## 2014-11-09 DIAGNOSIS — I248 Other forms of acute ischemic heart disease: Secondary | ICD-10-CM | POA: Diagnosis not present

## 2014-11-09 DIAGNOSIS — I4891 Unspecified atrial fibrillation: Secondary | ICD-10-CM | POA: Diagnosis not present

## 2014-11-09 DIAGNOSIS — Y712 Prosthetic and other implants, materials and accessory cardiovascular devices associated with adverse incidents: Secondary | ICD-10-CM | POA: Diagnosis not present

## 2014-11-09 DIAGNOSIS — R0602 Shortness of breath: Secondary | ICD-10-CM | POA: Diagnosis not present

## 2014-11-09 DIAGNOSIS — Z9119 Patient's noncompliance with other medical treatment and regimen: Secondary | ICD-10-CM | POA: Diagnosis present

## 2014-11-09 DIAGNOSIS — I10 Essential (primary) hypertension: Secondary | ICD-10-CM | POA: Diagnosis not present

## 2014-11-09 LAB — BASIC METABOLIC PANEL
ANION GAP: 5 (ref 5–15)
BUN: 8 mg/dL (ref 6–23)
CALCIUM: 8.2 mg/dL — AB (ref 8.4–10.5)
CHLORIDE: 103 mmol/L (ref 96–112)
CO2: 28 mmol/L (ref 19–32)
Creatinine, Ser: 1.1 mg/dL (ref 0.50–1.35)
GFR calc Af Amer: 70 mL/min — ABNORMAL LOW (ref >90)
GFR calc non Af Amer: 60 mL/min — ABNORMAL LOW (ref >90)
Glucose, Bld: 113 mg/dL — ABNORMAL HIGH (ref 70–99)
POTASSIUM: 3.7 mmol/L (ref 3.5–5.1)
SODIUM: 136 mmol/L (ref 135–145)

## 2014-11-09 LAB — APTT
APTT: 34 s (ref 24–37)
aPTT: 51 seconds — ABNORMAL HIGH (ref 24–37)

## 2014-11-09 LAB — CBC
HCT: 40.9 % (ref 39.0–52.0)
Hemoglobin: 13.6 g/dL (ref 13.0–17.0)
MCH: 28.7 pg (ref 26.0–34.0)
MCHC: 33.3 g/dL (ref 30.0–36.0)
MCV: 86.3 fL (ref 78.0–100.0)
PLATELETS: 146 10*3/uL — AB (ref 150–400)
RBC: 4.74 MIL/uL (ref 4.22–5.81)
RDW: 14.5 % (ref 11.5–15.5)
WBC: 6.2 10*3/uL (ref 4.0–10.5)

## 2014-11-09 LAB — HEPATIC FUNCTION PANEL
ALBUMIN: 3.2 g/dL — AB (ref 3.5–5.2)
ALT: 19 U/L (ref 0–53)
AST: 19 U/L (ref 0–37)
Alkaline Phosphatase: 62 U/L (ref 39–117)
Bilirubin, Direct: 0.2 mg/dL (ref 0.0–0.5)
Indirect Bilirubin: 0.8 mg/dL (ref 0.3–0.9)
Total Bilirubin: 1 mg/dL (ref 0.3–1.2)
Total Protein: 5.6 g/dL — ABNORMAL LOW (ref 6.0–8.3)

## 2014-11-09 LAB — HEPARIN LEVEL (UNFRACTIONATED)

## 2014-11-09 MED ORDER — SODIUM CHLORIDE 0.9 % IJ SOLN
3.0000 mL | Freq: Two times a day (BID) | INTRAMUSCULAR | Status: DC
  Administered 2014-11-09 – 2014-11-10 (×2): 3 mL via INTRAVENOUS

## 2014-11-09 MED ORDER — ASPIRIN 81 MG PO CHEW
81.0000 mg | CHEWABLE_TABLET | ORAL | Status: AC
  Administered 2014-11-10: 81 mg via ORAL
  Filled 2014-11-09: qty 1

## 2014-11-09 MED ORDER — SODIUM CHLORIDE 0.9 % IJ SOLN: 3.0000 mL | INTRAMUSCULAR | Status: DC | PRN

## 2014-11-09 MED ORDER — SODIUM CHLORIDE 0.9 % IV SOLN
250.0000 mL | INTRAVENOUS | Status: DC | PRN
  Administered 2014-11-09: 250 mL via INTRAVENOUS

## 2014-11-09 MED ORDER — FUROSEMIDE 40 MG PO TABS
40.0000 mg | ORAL_TABLET | Freq: Every day | ORAL | Status: DC
  Administered 2014-11-09 – 2014-11-10 (×2): 40 mg via ORAL
  Filled 2014-11-09 (×3): qty 1

## 2014-11-09 MED ORDER — HEPARIN (PORCINE) IN NACL 100-0.45 UNIT/ML-% IJ SOLN
1350.0000 [IU]/h | INTRAMUSCULAR | Status: DC
  Administered 2014-11-09: 1150 [IU]/h via INTRAVENOUS
  Filled 2014-11-09 (×4): qty 250

## 2014-11-09 MED ORDER — OFF THE BEAT BOOK
Freq: Once | Status: AC
  Administered 2014-11-09: 09:00:00
  Filled 2014-11-09: qty 1

## 2014-11-09 MED ORDER — METOPROLOL SUCCINATE ER 50 MG PO TB24
50.0000 mg | ORAL_TABLET | Freq: Every day | ORAL | Status: DC
  Administered 2014-11-10 – 2014-11-12 (×3): 50 mg via ORAL
  Filled 2014-11-09 (×3): qty 1

## 2014-11-09 MED ORDER — SODIUM CHLORIDE 0.9 % IV SOLN
INTRAVENOUS | Status: AC
  Administered 2014-11-10: 04:00:00 via INTRAVENOUS

## 2014-11-09 NOTE — Progress Notes (Signed)
ANTICOAGULATION CONSULT NOTE - Follow Up Consult  Pharmacy Consult for Heparin Indication: atrial fibrillation  No Known Allergies  Patient Measurements: Height: 5\' 9"  (175.3 cm) Weight: 202 lb 9.6 oz (91.899 kg) IBW/kg (Calculated) : 70.7 Heparin Dosing Weight: 89 kg  Vital Signs: Temp: 98.4 F (36.9 C) (02/23 1406) Temp Source: Oral (02/23 1406) BP: 123/74 mmHg (02/23 1406) Pulse Rate: 75 (02/23 1406)  Labs:  Recent Labs  11/07/14 0707 11/07/14 1622 11/07/14 1821 11/07/14 2235 11/08/14 0410 11/09/14 0458 11/09/14 1105 11/09/14 1956  HGB 15.1  --   --   --  13.5 13.6  --   --   HCT 44.6  --   --   --  40.1 40.9  --   --   PLT 143*  --   --   --  142* 146*  --   --   APTT 32  --   --   --   --   --  34 51*  LABPROT 16.2*  --   --   --  16.5*  --   --   --   INR 1.29  --   --   --  1.32  --   --   --   HEPARINUNFRC  --   --  0.36  --  0.38  --  >2.20*  --   CREATININE 1.24  --   --   --  1.07 1.10  --   --   TROPONINI 0.06* 0.08*  --  0.08* 0.09*  --   --   --     Estimated Creatinine Clearance: 57 mL/min (by C-G formula based on Cr of 1.1).   Medications:  Heparin at 1150 units/hr (11.5 ml/hr)  Assessment: 66 YOM admitted with new onset afib (CHADSVASC = 3). He was initially started on IV heparin and then transitioned to eliquis on 2/22 and back to Heparin earlier today for plans for a cardiac cath. No bleeding noted per RN report.  Goal of Therapy:  Heparin level 0.3-0.7 units/ml aPTT 66-102 seconds Monitor platelets by anticoagulation protocol: Yes   Plan:  1. Increase Heparin to 1250 units/hr (12.5 ml/hr) 2. Will continue to monitor for any signs/symptoms of bleeding and will follow up with aPTT/HL level in 8 hours  Alycia Rossetti, PharmD, BCPS Clinical Pharmacist Pager: [phone] 11/09/2014 8:35 PM

## 2014-11-09 NOTE — Discharge Instructions (Addendum)
Stroke Prevention Some medical conditions and behaviors are associated with an increased chance of having a stroke. You may prevent a stroke by making healthy choices and managing medical conditions. HOW CAN I REDUCE MY RISK OF HAVING A STROKE?   Stay physically active. Get at least 30 minutes of activity on most or all days.  Do not smoke. It may also be helpful to avoid exposure to secondhand smoke.  Limit alcohol use. Moderate alcohol use is considered to be:  No more than 2 drinks per day for men.  No more than 1 drink per day for nonpregnant women.  Eat healthy foods. This involves:  Eating 5 or more servings of fruits and vegetables a day.  Making dietary changes that address high blood pressure (hypertension), high cholesterol, diabetes, or obesity.  Manage your cholesterol levels.  Making food choices that are high in fiber and low in saturated fat, trans fat, and cholesterol may control cholesterol levels.  Take any prescribed medicines to control cholesterol as directed by your health care provider.  Manage your diabetes.  Controlling your carbohydrate and sugar intake is recommended to manage diabetes.  Take any prescribed medicines to control diabetes as directed by your health care provider.  Control your hypertension.  Making food choices that are low in salt (sodium), saturated fat, trans fat, and cholesterol is recommended to manage hypertension.  Take any prescribed medicines to control hypertension as directed by your health care provider.  Maintain a healthy weight.  Reducing calorie intake and making food choices that are low in sodium, saturated fat, trans fat, and cholesterol are recommended to manage weight.  Stop drug abuse.  Avoid taking birth control pills.  Talk to your health care provider about the risks of taking birth control pills if you are over 7 years old, smoke, get migraines, or have ever had a blood clot.  Get evaluated for sleep  disorders (sleep apnea).  Talk to your health care provider about getting a sleep evaluation if you snore a lot or have excessive sleepiness.  Take medicines only as directed by your health care provider.  For some people, aspirin or blood thinners (anticoagulants) are helpful in reducing the risk of forming abnormal blood clots that can lead to stroke. If you have the irregular heart rhythm of atrial fibrillation, you should be on a blood thinner unless there is a good reason you cannot take them.  Understand all your medicine instructions.  Make sure that other conditions (such as anemia or atherosclerosis) are addressed. SEEK IMMEDIATE MEDICAL CARE IF:   You have sudden weakness or numbness of the face, arm, or leg, especially on one side of the body.  Your face or eyelid droops to one side.  You have sudden confusion.  You have trouble speaking (aphasia) or understanding.  You have sudden trouble seeing in one or both eyes.  You have sudden trouble walking.  You have dizziness.  You have a loss of balance or coordination.  You have a sudden, severe headache with no known cause.  You have new chest pain or an irregular heartbeat. Any of these symptoms may represent a serious problem that is an emergency. Do not wait to see if the symptoms will go away. Get medical help at once. Call your local emergency services (911 in [place] Do not drive yourself to the hospital. Document Released: 10/11/2004 Document Revised: 01/18/2014 Document Reviewed: 03/06/2013 [facility] Patient Information 2015 [place], [place]. This information is not intended to replace advice given  to you by your health care provider. Make sure you discuss any questions you have with your health care provider. Information on my medicine - ELIQUIS (apixaban)  This medication education was reviewed with me or my healthcare representative as part of my discharge preparation.  The pharmacist that spoke with me during my  hospital stay was:  Deboraha Sprang, [facility]  Why was Eliquis prescribed for you? Eliquis was prescribed for you to reduce the risk of a blood clot forming that can cause a stroke if you have a medical condition called atrial fibrillation (a type of irregular heartbeat).  What do You need to know about Eliquis ? Take your Eliquis TWICE DAILY - one tablet in the morning and one tablet in the evening with or without food. If you have difficulty swallowing the tablet whole please discuss with your pharmacist how to take the medication safely.  Take Eliquis exactly as prescribed by your doctor and DO NOT stop taking Eliquis without talking to the doctor who prescribed the medication.  Stopping may increase your risk of developing a stroke.  Refill your prescription before you run out.  After discharge, you should have regular check-up appointments with your healthcare provider that is prescribing your Eliquis.  In the future your dose may need to be changed if your kidney function or weight changes by a significant amount or as you get older.  What do you do if you miss a dose? If you miss a dose, take it as soon as you remember on the same day and resume taking twice daily.  Do not take more than one dose of ELIQUIS at the same time to make up a missed dose.  Important Safety Information A possible side effect of Eliquis is bleeding. You should call your healthcare provider right away if you experience any of the following: ? Bleeding from an injury or your nose that does not stop. ? Unusual colored urine (red or dark brown) or unusual colored stools (red or black). ? Unusual bruising for unknown reasons. ? A serious fall or if you hit your head (even if there is no bleeding).  Some medicines may interact with Eliquis and might increase your risk of bleeding or clotting while on Eliquis. To help avoid this, consult your healthcare provider or pharmacist prior to using any new  prescription or non-prescription medications, including herbals, vitamins, non-steroidal anti-inflammatory drugs (NSAIDs) and supplements.  This website has more information on Eliquis (apixaban): http://www.eliquis.com/eliquis/home Atrial Fibrillation Atrial fibrillation is a type of irregular heart rhythm (arrhythmia). During atrial fibrillation, the upper chambers of the heart (atria) quiver continuously in a chaotic pattern. This causes an irregular and often rapid heart rate.  Atrial fibrillation is the result of the heart becoming overloaded with disorganized signals that tell it to beat. These signals are normally released one at a time by a part of the right atrium called the sinoatrial node. They then travel from the atria to the lower chambers of the heart (ventricles), causing the atria and ventricles to contract and pump blood as they pass. In atrial fibrillation, parts of the atria outside of the sinoatrial node also release these signals. This results in two problems. First, the atria receive so many signals that they do not have time to fully contract. Second, the ventricles, which can only receive one signal at a time, beat irregularly and out of rhythm with the atria.  There are three types of atrial fibrillation:   Paroxysmal. Paroxysmal atrial fibrillation  starts suddenly and stops on its own within a week.  Persistent. Persistent atrial fibrillation lasts for more than a week. It may stop on its own or with treatment.  Permanent. Permanent atrial fibrillation does not go away. Episodes of atrial fibrillation may lead to permanent atrial fibrillation. Atrial fibrillation can prevent your heart from pumping blood normally. It increases your risk of stroke and can lead to heart failure.  CAUSES   Heart conditions, including a heart attack, heart failure, coronary artery disease, and heart valve conditions.   Inflammation of the sac that surrounds the heart  (pericarditis).  Blockage of an artery in the lungs (pulmonary embolism).  Pneumonia or other infections.  Chronic lung disease.  Thyroid problems, especially if the thyroid is overactive (hyperthyroidism).  Caffeine, excessive alcohol use, and use of some illegal drugs.   Use of some medicines, including certain decongestants and diet pills.  Heart surgery.   Birth defects.  Sometimes, no cause can be found. When this happens, the atrial fibrillation is called lone atrial fibrillation. The risk of complications from atrial fibrillation increases if you have lone atrial fibrillation and you are age 60 years or older. RISK FACTORS  Heart failure.  Coronary artery disease.  Diabetes mellitus.   High blood pressure (hypertension).   Obesity.   Other arrhythmias.   Increased age. SIGNS AND SYMPTOMS   A feeling that your heart is beating rapidly or irregularly.   A feeling of discomfort or pain in your chest.   Shortness of breath.   Sudden light-headedness or weakness.   Getting tired easily when exercising.   Urinating more often than normal (mainly when atrial fibrillation first begins).  In paroxysmal atrial fibrillation, symptoms may start and suddenly stop. DIAGNOSIS  Your health care provider may be able to detect atrial fibrillation when taking your pulse. Your health care provider may have you take a test called an ambulatory electrocardiogram (ECG). An ECG records your heartbeat patterns over a 24-hour period. You may also have other tests, such as:  Transthoracic echocardiogram (TTE). During echocardiography, sound waves are used to evaluate how blood flows through your heart.  Transesophageal echocardiogram (TEE).  Stress test. There is more than one type of stress test. If a stress test is needed, ask your health care provider about which type is best for you.  Chest X-ray exam.  Blood tests.  Computed tomography (CT). TREATMENT   Treatment may include:  Treating any underlying conditions. For example, if you have an overactive thyroid, treating the condition may correct atrial fibrillation.  Taking medicine. Medicines may be given to control a rapid heart rate or to prevent blood clots, heart failure, or a stroke.  Having a procedure to correct the rhythm of the heart:  Electrical cardioversion. During electrical cardioversion, a controlled, low-energy shock is delivered to the heart through your skin. If you have chest pain, very low blood pressure, or sudden heart failure, this procedure may need to be done as an emergency.  Catheter ablation. During this procedure, heart tissues that send the signals that cause atrial fibrillation are destroyed.  Surgical ablation. During this surgery, thin lines of heart tissue that carry the abnormal signals are destroyed. This procedure can either be an open-heart surgery or a minimally invasive surgery. With the minimally invasive surgery, small cuts are made to access the heart instead of a large opening.  Pulmonary venous isolation. During this surgery, tissue around the veins that carry blood from the lungs (pulmonary veins) is destroyed.  This tissue is thought to carry the abnormal signals. HOME CARE INSTRUCTIONS   Take medicines only as directed by your health care provider. Some medicines can make atrial fibrillation worse or recur.  If blood thinners were prescribed by your health care provider, take them exactly as directed. Too much blood-thinning medicine can cause bleeding. If you take too little, you will not have the needed protection against stroke and other problems.  Perform blood tests at home if directed by your health care provider. Perform blood tests exactly as directed.  Quit smoking if you smoke.  Do not drink alcohol.  Do not drink caffeinated beverages such as coffee, soda, and some teas. You may drink decaffeinated coffee, soda, or tea.    Maintain a healthy weight.Do not use diet pills unless your health care provider approves. They may make heart problems worse.   Follow diet instructions as directed by your health care provider.  Exercise regularly as directed by your health care provider.  Keep all follow-up visits as directed by your health care provider. This is important. PREVENTION  The following substances can cause atrial fibrillation to recur:   Caffeinated beverages.  Alcohol.  Certain medicines, especially those used for breathing problems.  Certain herbs and herbal medicines, such as those containing ephedra or ginseng.  Illegal drugs, such as cocaine and amphetamines. Sometimes medicines are given to prevent atrial fibrillation from recurring. Proper treatment of any underlying condition is also important in helping prevent recurrence.  SEEK MEDICAL CARE IF:  You notice a change in the rate, rhythm, or strength of your heartbeat.  You suddenly begin urinating more frequently.  You tire more easily when exerting yourself or exercising. SEEK IMMEDIATE MEDICAL CARE IF:   You have chest pain, abdominal pain, sweating, or weakness.  You feel nauseous.  You have shortness of breath.  You suddenly have swollen feet and ankles.  You feel dizzy.  Your face or limbs feel numb or weak.  You have a change in your vision or speech. MAKE SURE YOU:   Understand these instructions.  Will watch your condition.  Will get help right away if you are not doing well or get worse. Document Released: 09/03/2005 Document Revised: 01/18/2014 Document Reviewed: 10/14/2012 [facility] Patient Information 2015 [place], [place]. This information is not intended to replace advice given to you by your health care provider. Make sure you discuss any questions you have with your health care provider.

## 2014-11-09 NOTE — Progress Notes (Signed)
Patient Profile: Ricardo Valencia is a 81 y.o. male with a history of HTN and HLD who presented to [facility] ED 11/07/14 with SOB and found to be in new onset afib with RVR and acute CHF. CHA2DS2 VASc score of 3 (HTN, Age > 75).   Subjective: Asymptomatic. Denies CP, palpitations, dyspnea. Denies past h/o CP.   Objective: Vital signs in last 24 hours: Temp:  [97.8 F (36.6 C)-98.1 F (36.7 C)] 98.1 F (36.7 C) (02/23 0547) Pulse Rate:  [80-98] 80 (02/23 0547) Resp:  [18-20] 18 (02/23 0547) BP: (126-145)/(75-88) 126/75 mmHg (02/23 0547) SpO2:  [95 %-97 %] 95 % (02/23 0547) Weight:  [202 lb 9.6 oz (91.899 kg)] 202 lb 9.6 oz (91.899 kg) (02/23 0547) Last BM Date: 11/07/14  Intake/Output from previous day: 02/22 0701 - 02/23 0700 In: 720 [P.O.:720] Out: 300 [Urine:300] Intake/Output this shift:    Medications Current Facility-Administered Medications  Medication Dose Route Frequency Provider Last Rate Last Dose  . acetaminophen (TYLENOL) tablet 650 mg  650 mg Oral Q4H PRN Eileen Stanford, PA-C      . apixaban Arne Cleveland) tablet 5 mg  5 mg Oral BID Burnell Blanks, MD   5 mg at 11/08/14 2112  . atorvastatin (LIPITOR) tablet 40 mg  40 mg Oral q1800 Brittainy M Simmons, PA-C      . diltiazem (CARDIZEM CD) 24 hr capsule 240 mg  240 mg Oral Daily Burnell Blanks, MD   240 mg at 11/08/14 1030  . metoprolol succinate (TOPROL-XL) 24 hr tablet 25 mg  25 mg Oral Daily Burnell Blanks, MD   25 mg at 11/08/14 1030  . off the beat book   Does not apply Once Louie Bun, Therapist, sports      . ondansetron (ZOFRAN) injection 4 mg  4 mg Intravenous Q6H PRN Eileen Stanford, PA-C   4 mg at 11/09/14 0234  . vitamin C (ASCORBIC ACID) tablet 250 mg  250 mg Oral Daily Herminio Commons, MD   250 mg at 11/08/14 1000    PE: General appearance: alert, cooperative and no distress Neck: no carotid bruit and no JVD Lungs:CTAB Heart: irregularly irregular rhythm Extremities: No LEE Pulses:  2+ and symmetric Skin: warm and dry Neurologic: Grossly normal  Lab Results:   Recent Labs  11/07/14 0707 11/08/14 0410 11/09/14 0458  WBC 6.6 6.3 6.2  HGB 15.1 13.5 13.6  HCT 44.6 40.1 40.9  PLT 143* 142* 146*   BMET  Recent Labs  11/07/14 0707 11/08/14 0410 11/09/14 0458  NA 139 137 136  K 4.0 3.6 3.7  CL 106 105 103  CO2 26 22 28   GLUCOSE 107* 104* 113*  BUN 8 8 8   CREATININE 1.24 1.07 1.10  CALCIUM 8.5 8.1* 8.2*   PT/INR  Recent Labs  11/07/14 0707 11/08/14 0410  LABPROT 16.2* 16.5*  INR 1.29 1.32   Cholesterol  Recent Labs  11/08/14 0500  CHOL 195   Cardiac Panel (last 3 results)  Recent Labs  11/07/14 1622 11/07/14 2235 11/08/14 0410  TROPONINI 0.08* 0.08* 0.09*   Lipid Panel     Component Value Date/Time   CHOL 195 11/08/2014 0500   TRIG 54 11/08/2014 0500   HDL 54 11/08/2014 0500   CHOLHDL 3.6 11/08/2014 0500   VLDL 11 11/08/2014 0500   LDLCALC 130* 11/08/2014 0500   LDLDIRECT 191.1 01/04/2011 1218    Studies/Results: 2D echo- 11/08/14 Study Conclusions  - Left ventricle: The cavity size was normal.  Wall thickness was increased in a pattern of mild LVH. Systolic function was severely reduced. The estimated ejection fraction was in the range of 25% to 30%. Diffuse hypokinesis. - Aortic valve: There was trivial regurgitation. - Mitral valve: There was mild to moderate regurgitation. - Left atrium: The atrium was mildly dilated. - Right atrium: The atrium was mildly dilated.  Impressions:  - Severe global reduction in LV function; mild LAE; mild to moderate MR; trace AI.  Assessment/Plan  Active Problems:   Atrial fibrillation with rapid ventricular response  1. New onset afib with RVR- still in Afib but rate is better controlled in the 90s. -- Given low EF, recommend discontinuation of Cardizem. Can use Toprol XL or Coreg.  He will require long term AC as his CHADSVASc score is at least 3 (HTN 1 age 72).  Continue Eliquis 5 mg BID  -- Plan on 3 weeks of uninterrupted anticoagulation with Eliquis and OP Cardioversion, if no spontaneous conversion to  NSR --2D echo shows low EF of 25-30% --ACS less likely with flat troponin trend and no h/o chest pain --TSH normal    2. Acute CHF- improved. likely due to afib with RVR -- BNP slightly elevated at 465 on admit. CXR with mild CHF on admit.  -- Now euvolemic on physical exam -- With low EF, will need low dose PO Lasix to prevent recurrent acute exacerbations  3. Systolic Dysfunction: - severe. Echo shows EF of 25-30%  -- this may possibly be tachy medicated from rapid Atrial fibrillation, however cannot fully r/o ischemia. He will need an ischemic evaluation, either NST vs LHC.  -- Change Cardizem to BB for HF. -- Add ACE/ARB if BP tolerates.   3. Elevated troponin- mild w/ flat trend. 0.08 -->0.08--> 0.09 -- Likely demand ischemia in the setting of afib with RVR but he does have RFs for CAD with HTN and HLD. With seve reduction in EF to 25-30%, he will need ischemic eval  4. HLD- fasting lipid shows elevated LDL of 130.  --Start Lipitor for LDL reduction.  -- LFTs are ok --Repeat fasting lipid and LFTs in 6 weeks  5. HTN- Controlled at 126/75 -- continue PO Cardizem -- Will possibly add a second agent. Choice will depend on echo finds.   [name] 11/09/2014 8:37 AM   I have personally seen and examined this patient with Ricardo Jester, PA-C. I agree with the assessment and plan as outlined above. He presented with atrial fib with RVR. Rate is now controlled on Cardizem and Toprol. Will ultimately try to transition to beta blocker alone but will continue Cardizem today. Increase Toprol to 50 mg daily. Cardiomyopathy may be tachycardia mediated vs other including obstructive CAD vs idiopathic. Troponin mildly elevated with flat trend. This may be due to demand ischemia from his elevated HR. I have discussed cardiac cath  to define coronary anatomy. This will be best done as an inpatient before he has been established on Eliquis. Will arrange cardiac cath tomorrow (left heart cath only). Risks and benefits reviewed with pt. Will stop Eliquis. Last dose last night. Start heparin per pharmacy today while awaiting cardiac cath and plan to resume Eliquis post cath. NPO at midnight.  MCALHANY,CHRISTOPHER 10:27 AM 11/09/2014

## 2014-11-09 NOTE — Progress Notes (Signed)
UR completed 

## 2014-11-09 NOTE — Progress Notes (Signed)
ANTICOAGULATION CONSULT NOTE - Follow Up Consult  Pharmacy Consult for Eliquis-->Heparin Indication: atrial fibrillation  No Known Allergies  Patient Measurements: Height: 5\' 9"  (175.3 cm) Weight: 202 lb 9.6 oz (91.899 kg) IBW/kg (Calculated) : 70.7 Heparin Dosing Weight: ~89kg  Vital Signs: Temp: 98.1 F (36.7 C) (02/23 0547) Temp Source: Oral (02/23 0547) BP: 128/83 mmHg (02/23 1005) Pulse Rate: 71 (02/23 1005)  Labs:  Recent Labs  11/07/14 0707 11/07/14 1622 11/07/14 1821 11/07/14 2235 11/08/14 0410 11/09/14 0458  HGB 15.1  --   --   --  13.5 13.6  HCT 44.6  --   --   --  40.1 40.9  PLT 143*  --   --   --  142* 146*  APTT 32  --   --   --   --   --   LABPROT 16.2*  --   --   --  16.5*  --   INR 1.29  --   --   --  1.32  --   HEPARINUNFRC  --   --  0.36  --  0.38  --   CREATININE 1.24  --   --   --  1.07 1.10  TROPONINI 0.06* 0.08*  --  0.08* 0.09*  --     Estimated Creatinine Clearance: 57 mL/min (by C-G formula based on Cr of 1.1).  Assessment: 85yom admitted with new onset afib (CHADSVASC = 3). He was initially started on IV heparin and then transitioned to eliquis yesterday. He now needs a cardiac cath to help determine the etiology of his afib so eliquis will be transitioned back to IV heparin. Last eliquis dose 2/22 @ 2112. Heparin was previously therapeutic on 1150 units/hr. Will  Use aPTTs to guide initial heparin dosing as eliquis falsely elevates heparin levels. Plan is for cath tomorrow.  Goal of Therapy:  APTT 66-102 seconds Heparin level 0.3-0.7 units/ml Monitor platelets by anticoagulation protocol: Yes   Plan:  1) Obtain baseline aPTT and heparin level 2) Begin heparin at 1150 units/hr with NO bolus 3) Check 8 hour aPTT  Deboraha Sprang 11/09/2014,10:37 AM

## 2014-11-09 NOTE — Progress Notes (Signed)
CARE MANAGEMENT NOTE 11/09/2014  Patient:  Ricardo Valencia, Ricardo Valencia   Account Number:  [id number]  Date Initiated:  11/08/2014  Documentation initiated by:  Marvetta Gibbons  Subjective/Objective Assessment:   Pt admitted with afib     Action/Plan:   PTA pt lived at  home with wife   Anticipated DC Date:  11/09/2014   Anticipated DC Plan:  [place]  CM consult  Medication Assistance      Choice offered to / List presented to:             Status of service:  In process, will continue to follow Medicare Important Message given?   (If response is "NO", the following Medicare IM given date fields will be blank) Date Medicare IM given:   Medicare IM given by:   Date Additional Medicare IM given:   Additional Medicare IM given by:    Discharge Disposition:    Per UR Regulation:  Reviewed for med. necessity/level of care/duration of stay  If discussed at [place] of Stay Meetings, dates discussed:    Comments:  11/08/14- 1700- Marvetta Gibbons RN, BSN [phone] Referral for Eliquis benefit check- attempted check- could not find pt's drug coverage- spoke with pt at bedside- at see if pt had info on his drug coverage- per pt he does have drug coverage benefits but does not have the info or card on him here at hospital- pt states that he would prefer to f/u on this when he gets home- instructed pt on the importance of finding out coverage copay cost and if any MD authorization needed for Eliquis- pt reports that he will f/u on this with his insurance and MD office- pt given 30 day free card for Eliquis.

## 2014-11-10 ENCOUNTER — Encounter (HOSPITAL_COMMUNITY)
Admission: EM | Disposition: A | Discharge status: Home / Self Care | Payer: Medicare Other | Source: Home / Self Care | Attending: Cardiovascular Disease

## 2014-11-10 ENCOUNTER — Encounter (HOSPITAL_COMMUNITY): Payer: Self-pay | Admitting: Cardiovascular Disease

## 2014-11-10 DIAGNOSIS — I2510 Atherosclerotic heart disease of native coronary artery without angina pectoris: Secondary | ICD-10-CM

## 2014-11-10 HISTORY — PX: PERCUTANEOUS CORONARY STENT INTERVENTION (PCI-S): SHX5485

## 2014-11-10 HISTORY — PX: LEFT HEART CATHETERIZATION WITH CORONARY ANGIOGRAM: SHX5451

## 2014-11-10 LAB — CBC
HEMATOCRIT: 41.4 % (ref 39.0–52.0)
Hemoglobin: 13.9 g/dL (ref 13.0–17.0)
MCH: 28.3 pg (ref 26.0–34.0)
MCHC: 33.6 g/dL (ref 30.0–36.0)
MCV: 84.3 fL (ref 78.0–100.0)
PLATELETS: 151 10*3/uL (ref 150–400)
RBC: 4.91 MIL/uL (ref 4.22–5.81)
RDW: 14.4 % (ref 11.5–15.5)
WBC: 6.2 10*3/uL (ref 4.0–10.5)

## 2014-11-10 LAB — PROTIME-INR
INR: 1.29 (ref 0.00–1.49)
Prothrombin Time: 16.2 seconds — ABNORMAL HIGH (ref 11.6–15.2)

## 2014-11-10 LAB — BASIC METABOLIC PANEL
ANION GAP: 7 (ref 5–15)
BUN: 10 mg/dL (ref 6–23)
CALCIUM: 8.3 mg/dL — AB (ref 8.4–10.5)
CO2: 25 mmol/L (ref 19–32)
CREATININE: 1.19 mg/dL (ref 0.50–1.35)
Chloride: 103 mmol/L (ref 96–112)
GFR, EST AFRICAN AMERICAN: 63 mL/min — AB (ref >90)
GFR, EST NON AFRICAN AMERICAN: 55 mL/min — AB (ref >90)
Glucose, Bld: 95 mg/dL (ref 70–99)
Potassium: 3.8 mmol/L (ref 3.5–5.1)
Sodium: 135 mmol/L (ref 135–145)

## 2014-11-10 LAB — HEPARIN LEVEL (UNFRACTIONATED): HEPARIN UNFRACTIONATED: 1.82 [IU]/mL — AB (ref 0.30–0.70)

## 2014-11-10 LAB — APTT: aPTT: 58 seconds — ABNORMAL HIGH (ref 24–37)

## 2014-11-10 LAB — POCT ACTIVATED CLOTTING TIME: Activated Clotting Time: 521 seconds

## 2014-11-10 SURGERY — LEFT HEART CATHETERIZATION WITH CORONARY ANGIOGRAM

## 2014-11-10 MED ORDER — VERAPAMIL HCL 2.5 MG/ML IV SOLN
INTRAVENOUS | Status: AC
  Filled 2014-11-10: qty 2

## 2014-11-10 MED ORDER — APIXABAN 5 MG PO TABS
5.0000 mg | ORAL_TABLET | Freq: Two times a day (BID) | ORAL | Status: DC
  Administered 2014-11-11 – 2014-11-12 (×3): 5 mg via ORAL
  Filled 2014-11-10 (×4): qty 1

## 2014-11-10 MED ORDER — SODIUM CHLORIDE 0.9 % IV SOLN: INTRAVENOUS | Status: AC

## 2014-11-10 MED ORDER — FENTANYL CITRATE 0.05 MG/ML IJ SOLN
INTRAMUSCULAR | Status: AC
  Filled 2014-11-10: qty 2

## 2014-11-10 MED ORDER — CLOPIDOGREL BISULFATE 75 MG PO TABS
75.0000 mg | ORAL_TABLET | Freq: Every day | ORAL | Status: DC
  Administered 2014-11-11 – 2014-11-12 (×2): 75 mg via ORAL
  Filled 2014-11-10 (×2): qty 1

## 2014-11-10 MED ORDER — MIDAZOLAM HCL 2 MG/2ML IJ SOLN
INTRAMUSCULAR | Status: AC
  Filled 2014-11-10: qty 2

## 2014-11-10 MED ORDER — ASPIRIN 81 MG PO CHEW
CHEWABLE_TABLET | ORAL | Status: AC
  Filled 2014-11-10: qty 3

## 2014-11-10 MED ORDER — HEPARIN (PORCINE) IN NACL 2-0.9 UNIT/ML-% IJ SOLN
INTRAMUSCULAR | Status: AC
  Filled 2014-11-10: qty 1000

## 2014-11-10 MED ORDER — HEPARIN SODIUM (PORCINE) 1000 UNIT/ML IJ SOLN
INTRAMUSCULAR | Status: AC
  Filled 2014-11-10: qty 1

## 2014-11-10 MED ORDER — BIVALIRUDIN 250 MG IV SOLR
INTRAVENOUS | Status: AC
  Filled 2014-11-10: qty 250

## 2014-11-10 MED ORDER — LIDOCAINE HCL (PF) 1 % IJ SOLN
INTRAMUSCULAR | Status: AC
  Filled 2014-11-10: qty 30

## 2014-11-10 MED ORDER — NITROGLYCERIN 1 MG/10 ML FOR IR/CATH LAB
INTRA_ARTERIAL | Status: AC
  Filled 2014-11-10: qty 10

## 2014-11-10 NOTE — Progress Notes (Signed)
ANTICOAGULATION CONSULT NOTE - Follow Up Consult  Pharmacy Consult for Heparin Indication: atrial fibrillation  No Known Allergies  Patient Measurements: Height: 5\' 9"  (175.3 cm) Weight: 202 lb 14.4 oz (92.035 kg) IBW/kg (Calculated) : 70.7 Heparin Dosing Weight: 89 kg  Vital Signs: Temp: 98.5 F (36.9 C) (02/24 0423) Temp Source: Oral (02/24 0423) BP: 130/79 mmHg (02/24 0423) Pulse Rate: 75 (02/24 0423)  Labs:  Recent Labs  11/07/14 0707 11/07/14 1622 11/07/14 1821 11/07/14 2235 11/08/14 0410 11/09/14 0458 11/09/14 1105 11/09/14 1956 11/10/14 0527  HGB 15.1  --   --   --  13.5 13.6  --   --  13.9  HCT 44.6  --   --   --  40.1 40.9  --   --  41.4  PLT 143*  --   --   --  142* 146*  --   --  151  APTT 32  --   --   --   --   --  34 51* 58*  LABPROT 16.2*  --   --   --  16.5*  --   --   --  16.2*  INR 1.29  --   --   --  1.32  --   --   --  1.29  HEPARINUNFRC  --   --  0.36  --  0.38  --  >2.20*  --   --   CREATININE 1.24  --   --   --  1.07 1.10  --   --  1.19  TROPONINI 0.06* 0.08*  --  0.08* 0.09*  --   --   --   --     Estimated Creatinine Clearance: 52.7 mL/min (by C-G formula based on Cr of 1.19).  Assessment: 81 YO male with Afib, Eliquis on hold, for heparin. PTT subtherapeutic this morning  Goal of Therapy:  Heparin level 0.3-0.7 units/ml aPTT 66-102 seconds Monitor platelets by anticoagulation protocol: Yes   Plan:  Increase Heparin 1350 units/hr F/U after cath  Phillis Knack, PharmD, BCPS   11/10/2014 6:50 AM

## 2014-11-10 NOTE — CV Procedure (Signed)
Cardiac Catheterization Procedure Note  Name: Ricardo Valencia MRN: [id number] DOB: Apr 25, 1929  Procedure: Left Heart Cath, Selective Coronary Angiography, LV angiography, PTCA and stenting of the mid LAD  Indication: Newly diagnosed cardiomyopathy with mildly elevated troponin in the setting of atrial fibrillation.  Medications:  Sedation:  1 mg IV Versed, 25 mcg IV Fentanyl  Contrast:  120 ml Omnipaque  Procedural Details: The right wrist was prepped, draped, and anesthetized with 1% lidocaine. Using the modified Seldinger technique, a 5 French Slender sheath was introduced into the right radial artery. 3 mg of verapamil was administered through the sheath, weight-based unfractionated heparin was administered intravenously. A Jackie catheter was used for selective coronary angiography and left ventriculography. Catheter exchanges were performed over an exchange length guidewire. There were no immediate procedural complications.  Procedural Findings:  Hemodynamics: AO:  136/71   mmHg LV:  134/10    mmHg LVEDP: 17  mmHg  Coronary angiography: Coronary dominance: Right   Left Main:  Normal  Left Anterior Descending (LAD):  Normal in size with minor irregularities in the proximal segment. There is a 90% discrete stenosis in the midsegment just before the origin of the second diagonal. The rest of the vessel has minor irregularities and wraps around the apex.  1st diagonal (D1):  Normal in size with 40% proximal stenosis.  2nd diagonal (D2):  Normal in size with 20% ostial stenosis.  3rd diagonal (D3):  Small in size with minor irregularities.  Circumflex (LCx):  Normal in size with minor irregularities.  1st obtuse marginal:  Small in size with minor irregularities.  2nd obtuse marginal:  Medium in size with minor irregularities.  3rd obtuse marginal:  Medium in size with minor irregularities.    Right Coronary Artery: Large in size and dominant. The vessel has minor  irregularities. There is 50% stenosis distally before the bifurcation  Posterior descending artery: Large in size with no significant disease.  Left ventriculography: Left ventricular systolic function is moderately to severely reduced , LVEF is estimated at 30-35 %, there is no significant mitral regurgitation   PCI Note:  Following the diagnostic procedure, the decision was made to proceed with PCI.  Weight-based bivalirudin was given for anticoagulation. Once a therapeutic ACT was achieved, a 6 [place] XB LAD 3.5 guide catheter was inserted.  A run through coronary guidewire was used to cross the lesion.  The lesion was predilated with a 2.5 x 12 balloon.  The lesion was then stented with a 2.75 x 18 mm Xience  drug-eluting stent.  The stent was postdilated with a 3.0 x 12 mm noncompliant balloon. The stent jailed the second diagonal but did not affect ostial stenosis. There was normal flow. Following PCI, there was 0% residual stenosis and TIMI-3 flow. Final angiography confirmed an excellent result. The patient tolerated the procedure well. There were no immediate procedural complications. A TR band was used for radial hemostasis. The patient was transferred to the post catheterization recovery area for further monitoring.  PCI Data: Vessel - LAD/Segment - mid Percent Stenosis (pre)  90% eccentric TIMI-flow 3 Stent : 2.75 x 18 mm Xience  drug-eluting stent Percent Stenosis (post) 0% TIMI-flow (post) 3   Final Conclusions:  1. Severe one-vessel coronary artery disease. 2. Moderately to severely reduced LV systolic function. 3. Mildly elevated left ventricular end-diastolic pressure. 4. Successful angioplasty and drug-eluting stent placement to the mid LAD  Recommendations:  Given that the patient has atrial fibrillation, recommend treatment with Plavix and Eliquis  without aspirin. Treatment with Eliquis can be started tomorrow morning if no bleeding complications. Continue rate control for  atrial fibrillation and proceed with cardioversion in 3-4 weeks as planned.  Kathlyn Sacramento MD, [facility] 11/10/2014, 5:59 PM

## 2014-11-10 NOTE — Progress Notes (Signed)
SUBJECTIVE: No chest pain or SOB this am.   BP 130/79 mmHg  Pulse 75  Temp(Src) 98.5 F (36.9 C) (Oral)  Resp 18  Ht 5\' 9"  (1.753 m)  Wt 202 lb 14.4 oz (92.035 kg)  BMI 29.95 kg/m2  SpO2 95%  Intake/Output Summary (Last 24 hours) at 11/10/14 0719 Last data filed at 11/09/14 2040  Gross per 24 hour  Intake    960 ml  Output    901 ml  Net     59 ml    PHYSICAL EXAM General: Well developed, well nourished, in no acute distress. Alert and oriented x 3.  Psych:  Good affect, responds appropriately Neck: No JVD. No masses noted.  Lungs: Clear bilaterally with no wheezes or rhonci noted.  Heart: Irreg irreg with no murmurs noted. Abdomen: Bowel sounds are present. Soft, non-tender.  Extremities: No lower extremity edema.   LABS: Basic Metabolic Panel:  Recent Labs  11/09/14 0458 11/10/14 0527  NA 136 135  K 3.7 3.8  CL 103 103  CO2 28 25  GLUCOSE 113* 95  BUN 8 10  CREATININE 1.10 1.19  CALCIUM 8.2* 8.3*   CBC:  Recent Labs  11/09/14 0458 11/10/14 0527  WBC 6.2 6.2  HGB 13.6 13.9  HCT 40.9 41.4  MCV 86.3 84.3  PLT 146* 151   Cardiac Enzymes:  Recent Labs  11/07/14 1622 11/07/14 2235 11/08/14 0410  TROPONINI 0.08* 0.08* 0.09*   Fasting Lipid Panel:  Recent Labs  11/08/14 0500  CHOL 195  HDL 54  LDLCALC 130*  TRIG 54  CHOLHDL 3.6    Current Meds: . atorvastatin  40 mg Oral q1800  . diltiazem  240 mg Oral Daily  . furosemide  40 mg Oral Daily  . metoprolol succinate  50 mg Oral Daily  . sodium chloride  3 mL Intravenous Q12H  . vitamin C  250 mg Oral Daily   Echo 11/08/12: Left ventricle: The cavity size was normal. Wall thickness was increased in a pattern of mild LVH. Systolic function was severely reduced. The estimated ejection fraction was in the range of 25% to 30%. Diffuse hypokinesis. - Aortic valve: There was trivial regurgitation. - Mitral valve: There was mild to moderate regurgitation. - Left atrium: The  atrium was mildly dilated. - Right atrium: The atrium was mildly dilated.  Impressions:  - Severe global reduction in LV function; mild LAE; mild to moderate MR; trace AI.  ASSESSMENT AND PLAN:  1. Atrial fibrillation, new onset: Presented with RVR.  Rate is controlled on Cardizem and Metoprolol. Will consider stopping Cardizem given LV dysfunction and starting amiodarone. He will require long term AC as his CHADSVASc score is at least 3 (HTN 1 age 6). Currently on heparin drip. He will be started on Eliquis following his cardiac cath. Plan on 4 weeks of uninterrupted anticoagulation with Eliquis and OP Cardioversion, if no spontaneous conversion to NSR. TSH normal   2. Acute systolic CHF: Volume status is much better. Diuresed well with Lasix.   3. Cardiomyopathy: Echo shows EF of 25-30% Presumed to be non-ischemic especially given presentation with atrial fib with RVR but will plan cardiac cath today (left heart cath only) to exclude CAD. Will add ACE/ARB if BP tolerates.   3. Elevated troponin- mild w/ flat trend. 0.08 -->0.08--> 0.09. Likely demand ischemia in the setting of afib with RVR but he does have RFs for CAD with HTN and HLD. With severe reduction in LVEF to  25-30%, we will plan cardiac cath today.   4. HLD: Continue statin.   5. HTN: BP controlled.   6. Mitral regurgitation: Mild to moderate by echo. Will follow.   Ricardo Valencia  2/24/20167:19 AM

## 2014-11-10 NOTE — Progress Notes (Signed)
PHARMACY NOTE  Pharmacy Consult :  81 y.o. male is s/p cardiac cath who is to restart Apixaban in the morning for Non-valvular atrial fibrillation.  Patient's home dose of Apixaban was 5 mg BID.Marland Kitchen   Dosing Wt :  92 kg  Hematology :   11/08/14 0410 11/10/14 0527  HGB 13.5 13.9  HCT 40.1 41.4  PLT 142* 151  APTT  --  58*  LABPROT 16.5* 16.2*  INR 1.32 1.29  HEPARINUNFRC 0.38 1.82*  CREATININE 1.07 1.19   Estimated Creatinine Clearance: 52.7 mL/min (by C-G formula based on Cr of 1.19).   Current Medication[s] Include: Medication PTA: Prescriptions prior to admission  Medication Sig Dispense Refill Last Dose  . Ascorbic Acid (VITAMIN C) 100 MG tablet Take 100 mg by mouth daily.     11/06/2014 at Unknown time  . aspirin 325 MG tablet Take 325 mg by mouth daily.     11/05/2014  . atorvastatin (LIPITOR) 20 MG tablet Take 1 tablet (20 mg total) by mouth daily. (Patient not taking: Reported on 11/07/2014) 30 tablet 11   . lisinopril-hydrochlorothiazide (PRINZIDE,ZESTORETIC) 10-12.5 MG per tablet Take 1 tablet by mouth daily. (Patient not taking: Reported on 11/07/2014) 30 tablet 11    Scheduled:  Scheduled:  . [START ON 11/11/2014] apixaban  5 mg Oral BID  . atorvastatin  40 mg Oral q1800  . [START ON 11/11/2014] clopidogrel  75 mg Oral Q breakfast  . diltiazem  240 mg Oral Daily  . furosemide  40 mg Oral Daily  . metoprolol succinate  50 mg Oral Daily  . vitamin C  250 mg Oral Daily   Infusion[s]: Infusions:  . Heparin  discontinued    Assessment :  No evidence of bleeding complications observed while on Heparin or following cath.  Goal : VTE prophylaxis with Apixaban.  Plan : 1. Restart Apixaban in the AM at previous dose of 5 mg BID.  Seann Genther, Craig Guess,  Pharm.D  11/10/2014  6:28 PM

## 2014-11-10 NOTE — Interval H&P Note (Signed)
History and Physical Interval Note:  11/10/2014 4:58 PM  Ricardo Valencia  has presented today for surgery, with the diagnosis of cp  The various methods of treatment have been discussed with the patient and family. After consideration of risks, benefits and other options for treatment, the patient has consented to  Procedure(s): LEFT HEART CATHETERIZATION WITH CORONARY ANGIOGRAM (N/A) as a surgical intervention .  The patient's history has been reviewed, patient examined, no change in status, stable for surgery.  I have reviewed the patient's chart and labs.  Questions were answered to the patient's satisfaction.     Kathlyn Sacramento

## 2014-11-10 NOTE — H&P (View-Only) (Signed)
SUBJECTIVE: No chest pain or SOB this am.   BP 130/79 mmHg  Pulse 75  Temp(Src) 98.5 F (36.9 C) (Oral)  Resp 18  Ht 5\' 9"  (1.753 m)  Wt 202 lb 14.4 oz (92.035 kg)  BMI 29.95 kg/m2  SpO2 95%  Intake/Output Summary (Last 24 hours) at 11/10/14 0719 Last data filed at 11/09/14 2040  Gross per 24 hour  Intake    960 ml  Output    901 ml  Net     59 ml    PHYSICAL EXAM General: Well developed, well nourished, in no acute distress. Alert and oriented x 3.  Psych:  Good affect, responds appropriately Neck: No JVD. No masses noted.  Lungs: Clear bilaterally with no wheezes or rhonci noted.  Heart: Irreg irreg with no murmurs noted. Abdomen: Bowel sounds are present. Soft, non-tender.  Extremities: No lower extremity edema.   LABS: Basic Metabolic Panel:  Recent Labs  11/09/14 0458 11/10/14 0527  NA 136 135  K 3.7 3.8  CL 103 103  CO2 28 25  GLUCOSE 113* 95  BUN 8 10  CREATININE 1.10 1.19  CALCIUM 8.2* 8.3*   CBC:  Recent Labs  11/09/14 0458 11/10/14 0527  WBC 6.2 6.2  HGB 13.6 13.9  HCT 40.9 41.4  MCV 86.3 84.3  PLT 146* 151   Cardiac Enzymes:  Recent Labs  11/07/14 1622 11/07/14 2235 11/08/14 0410  TROPONINI 0.08* 0.08* 0.09*   Fasting Lipid Panel:  Recent Labs  11/08/14 0500  CHOL 195  HDL 54  LDLCALC 130*  TRIG 54  CHOLHDL 3.6    Current Meds: . atorvastatin  40 mg Oral q1800  . diltiazem  240 mg Oral Daily  . furosemide  40 mg Oral Daily  . metoprolol succinate  50 mg Oral Daily  . sodium chloride  3 mL Intravenous Q12H  . vitamin C  250 mg Oral Daily   Echo 11/08/12: Left ventricle: The cavity size was normal. Wall thickness was increased in a pattern of mild LVH. Systolic function was severely reduced. The estimated ejection fraction was in the range of 25% to 30%. Diffuse hypokinesis. - Aortic valve: There was trivial regurgitation. - Mitral valve: There was mild to moderate regurgitation. - Left atrium: The  atrium was mildly dilated. - Right atrium: The atrium was mildly dilated.  Impressions:  - Severe global reduction in LV function; mild LAE; mild to moderate MR; trace AI.  ASSESSMENT AND PLAN:  1. Atrial fibrillation, new onset: Presented with RVR.  Rate is controlled on Cardizem and Metoprolol. Will consider stopping Cardizem given LV dysfunction and starting amiodarone. He will require long term AC as his CHADSVASc score is at least 3 (HTN 1 age 81). Currently on heparin drip. He will be started on Eliquis following his cardiac cath. Plan on 4 weeks of uninterrupted anticoagulation with Eliquis and OP Cardioversion, if no spontaneous conversion to NSR. TSH normal   2. Acute systolic CHF: Volume status is much better. Diuresed well with Lasix.   3. Cardiomyopathy: Echo shows EF of 25-30% Presumed to be non-ischemic especially given presentation with atrial fib with RVR but will plan cardiac cath today (left heart cath only) to exclude CAD. Will add ACE/ARB if BP tolerates.   3. Elevated troponin- mild w/ flat trend. 0.08 -->0.08--> 0.09. Likely demand ischemia in the setting of afib with RVR but he does have RFs for CAD with HTN and HLD. With severe reduction in LVEF to  25-30%, we will plan cardiac cath today.   4. HLD: Continue statin.   5. HTN: BP controlled.   6. Mitral regurgitation: Mild to moderate by echo. Will follow.   Ricardo Valencia  2/24/20167:19 AM

## 2014-11-11 ENCOUNTER — Encounter (HOSPITAL_COMMUNITY): Payer: Self-pay | Admitting: Physician Assistant

## 2014-11-11 DIAGNOSIS — I2510 Atherosclerotic heart disease of native coronary artery without angina pectoris: Secondary | ICD-10-CM | POA: Diagnosis present

## 2014-11-11 DIAGNOSIS — E785 Hyperlipidemia, unspecified: Secondary | ICD-10-CM

## 2014-11-11 DIAGNOSIS — I25119 Atherosclerotic heart disease of native coronary artery with unspecified angina pectoris: Secondary | ICD-10-CM

## 2014-11-11 LAB — BASIC METABOLIC PANEL
Anion gap: 13 (ref 5–15)
BUN: 10 mg/dL (ref 6–23)
CALCIUM: 8.4 mg/dL (ref 8.4–10.5)
CHLORIDE: 100 mmol/L (ref 96–112)
CO2: 24 mmol/L (ref 19–32)
CREATININE: 1.2 mg/dL (ref 0.50–1.35)
GFR calc Af Amer: 63 mL/min — ABNORMAL LOW (ref >90)
GFR calc non Af Amer: 54 mL/min — ABNORMAL LOW (ref >90)
GLUCOSE: 98 mg/dL (ref 70–99)
Potassium: 3.7 mmol/L (ref 3.5–5.1)
Sodium: 137 mmol/L (ref 135–145)

## 2014-11-11 LAB — CBC
HCT: 41.2 % (ref 39.0–52.0)
Hemoglobin: 13.6 g/dL (ref 13.0–17.0)
MCH: 28.2 pg (ref 26.0–34.0)
MCHC: 33 g/dL (ref 30.0–36.0)
MCV: 85.3 fL (ref 78.0–100.0)
Platelets: 159 10*3/uL (ref 150–400)
RBC: 4.83 MIL/uL (ref 4.22–5.81)
RDW: 14.4 % (ref 11.5–15.5)
WBC: 7.1 10*3/uL (ref 4.0–10.5)

## 2014-11-11 LAB — APTT: aPTT: 38 seconds — ABNORMAL HIGH (ref 24–37)

## 2014-11-11 MED ORDER — FUROSEMIDE 10 MG/ML IJ SOLN
40.0000 mg | Freq: Every day | INTRAMUSCULAR | Status: DC
  Administered 2014-11-11 – 2014-11-12 (×2): 40 mg via INTRAVENOUS
  Filled 2014-11-11 (×3): qty 4

## 2014-11-11 MED ORDER — FUROSEMIDE 10 MG/ML IJ SOLN
40.0000 mg | Freq: Once | INTRAMUSCULAR | Status: AC
Start: 2014-11-11 — End: 2014-11-11
  Administered 2014-11-11: 16:00:00 40 mg via INTRAVENOUS

## 2014-11-11 MED FILL — Sodium Chloride IV Soln 0.9%: INTRAVENOUS | Qty: 50 | Status: AC

## 2014-11-11 NOTE — Progress Notes (Signed)
Notified MD that patient is having frequent pauses a symptomatic and BP 130/80 continues to be Afib . I will continue to monitor.

## 2014-11-11 NOTE — Progress Notes (Addendum)
Patient Name: Ricardo Valencia Date of Encounter: 11/11/2014  Primary Cardiologist: new   Principal Problem:   Atrial fibrillation with rapid ventricular response Active Problems:   Hypertension   Hyperlipidemia   CAD (coronary artery disease)    SUBJECTIVE  Denies any CP, had some SOB last night requiring elevation of the bed  CURRENT MEDS . apixaban  5 mg Oral BID  . atorvastatin  40 mg Oral q1800  . clopidogrel  75 mg Oral Q breakfast  . diltiazem  240 mg Oral Daily  . furosemide  40 mg Oral Daily  . metoprolol succinate  50 mg Oral Daily  . vitamin C  250 mg Oral Daily    OBJECTIVE  Filed Vitals:   11/10/14 2005 11/10/14 2306 11/11/14 0022 11/11/14 0627  BP: 123/50 145/68  153/69  Pulse: 70 62  86  Temp: 97.2 F (36.2 C) 98.5 F (36.9 C)  98.5 F (36.9 C)  TempSrc: Oral Oral  Oral  Resp: 16 16  18   Height:      Weight:   203 lb 14.8 oz (92.5 kg)   SpO2: 98% 99%  98%    Intake/Output Summary (Last 24 hours) at 11/11/14 0649 Last data filed at 11/10/14 2215  Gross per 24 hour  Intake  712.5 ml  Output    100 ml  Net  612.5 ml   Filed Weights   11/09/14 0547 11/10/14 0300 11/11/14 0022  Weight: 202 lb 9.6 oz (91.899 kg) 202 lb 14.4 oz (92.035 kg) 203 lb 14.8 oz (92.5 kg)    PHYSICAL EXAM  General: Pleasant, NAD. Neuro: Alert and oriented X 3. Moves all extremities spontaneously. Psych: Normal affect. HEENT:  Normal  Neck: Supple without bruits  +JVD. Lungs:  Resp regular and unlabored. Bibasilar rale. Heart: RRR no s3, s4, or murmurs. Abdomen: Soft, non-tender, non-distended, BS + x 4.  Extremities: No clubbing, cyanosis. DP/PT/Radials 2+ and equal bilaterally. 1+ pitting edema in bilateral LE  Accessory Clinical Findings  CBC  Recent Labs  11/10/14 0527 11/11/14 0241  WBC 6.2 7.1  HGB 13.9 13.6  HCT 41.4 41.2  MCV 84.3 85.3  PLT 151 127   Basic Metabolic Panel  Recent Labs  11/10/14 0527 11/11/14 0241  NA 135 137  K 3.8  3.7  CL 103 100  CO2 25 24  GLUCOSE 95 98  BUN 10 10  CREATININE 1.19 1.20  CALCIUM 8.3* 8.4   Liver Function Tests  Recent Labs  11/09/14 0458  AST 19  ALT 19  ALKPHOS 62  BILITOT 1.0  PROT 5.6*  ALBUMIN 3.2*    TELE A-fib with HR 70-80s    ECG  A-fib with HR 70s  Echocardiogram 11/08/2014  LV EF: 25% -  30%  ------------------------------------------------------------------- Indications:   Atrial fibrillation - 427.31.  ------------------------------------------------------------------- History:  Risk factors: Hypertension. Dyslipidemia.  ------------------------------------------------------------------- Study Conclusions  - Left ventricle: The cavity size was normal. Wall thickness was increased in a pattern of mild LVH. Systolic function was severely reduced. The estimated ejection fraction was in the range of 25% to 30%. Diffuse hypokinesis. - Aortic valve: There was trivial regurgitation. - Mitral valve: There was mild to moderate regurgitation. - Left atrium: The atrium was mildly dilated. - Right atrium: The atrium was mildly dilated.  Impressions:  - Severe global reduction in LV function; mild LAE; mild to moderate MR; trace AI.    Radiology/Studies  Dg Chest 2 View  11/07/2014   CLINICAL DATA:  Hypertension.  Worsening shortness of Breath  EXAM: CHEST  2 VIEW  COMPARISON:  None.  FINDINGS: There is mild cardiac enlargement. Small bilateral pleural effusions and mild interstitial edema is identified. Lung volumes are low.  IMPRESSION: Mild CHF.   Electronically Signed   By: Kerby Moors M.D.   On: 11/07/2014 07:57    ASSESSMENT AND PLAN  1. New onset a-fib  - CHADSVASc score is at least 3 (HTN 1 age 81)  - start Eliquis this morning. DC IV heparin. Will consider DCCV in 3-4 weeks after place on eliquis  2. Acute systolic HF  - still appear to be fluid overloaded, + JVD and 1+ pitting edema, consider restart IV lasix for  further diuresis.  3. Cardiomyopathy  - cath 11/10/2014 DES (2.75 x 18 mm Xience) to 90% mid LAD  - post cath, started on plavix and eliquis without ASA.   - Echo EF 25-30%, likely related to combination of a-fib with RVR and underlying ischemia  - per Dr. Angelena Form, may consider d/c diltiazem given LV dysfunction and place on amiodarone at some point  - need recheck Echo in 3 month, ?if need LifeVest  4. Elevated trop  5. HTN  6. HLD 7. Mild to moderate MR  Signed, Woodward Ku Pager: [phone]  I have examined the patient and reviewed assessment and plan and discussed with patient.  Agree with above as stated.  S/p PCI.  Still had some sx of heart failure last night. Low EF.  He felt better sitting up.  He had some SHOB in the middle of the night.  WIll plan on diuresing today.  Watch renal function. Will given an additional dose of IV Lasix this afternoon.  Minimally elevated troponin.  Would hold off on Lifevest at this time.  Continue to watch on tele and reconsider based on results. EF should improve after PCI and with rate control.   Charde Macfarlane S.

## 2014-11-11 NOTE — Progress Notes (Signed)
CARDIAC REHAB PHASE I   PRE:  Rate/Rhythm: 87 afib with PVC    BP: sitting 159/87    SaO2:   MODE:  Ambulation: 350 ft   POST:  Rate/Rhythm: 109 afib with PVC    BP: sitting 141/75     SaO2:   Tolerated fairly well, slightly unsteady. Denied SOB today. To recliner. Discussed stent, HF management, low sodium, ex, NTG and CRPII. Pt voiced understanding and requests his name be sent to [facility]. Would be beneficial to do some ed with wife as well. [phone]   Josephina Shih Muldraugh CES, ACSM 11/11/2014 8:36 AM

## 2014-11-12 DIAGNOSIS — I25110 Atherosclerotic heart disease of native coronary artery with unstable angina pectoris: Secondary | ICD-10-CM

## 2014-11-12 LAB — BASIC METABOLIC PANEL
ANION GAP: 7 (ref 5–15)
BUN: 10 mg/dL (ref 6–23)
CHLORIDE: 100 mmol/L (ref 96–112)
CO2: 29 mmol/L (ref 19–32)
Calcium: 8.4 mg/dL (ref 8.4–10.5)
Creatinine, Ser: 1.27 mg/dL (ref 0.50–1.35)
GFR calc Af Amer: 59 mL/min — ABNORMAL LOW (ref >90)
GFR calc non Af Amer: 50 mL/min — ABNORMAL LOW (ref >90)
Glucose, Bld: 89 mg/dL (ref 70–99)
POTASSIUM: 4 mmol/L (ref 3.5–5.1)
Sodium: 136 mmol/L (ref 135–145)

## 2014-11-12 LAB — CBC
HCT: 41.6 % (ref 39.0–52.0)
Hemoglobin: 14.6 g/dL (ref 13.0–17.0)
MCH: 30.6 pg (ref 26.0–34.0)
MCHC: 35.1 g/dL (ref 30.0–36.0)
MCV: 87.2 fL (ref 78.0–100.0)
Platelets: 170 10*3/uL (ref 150–400)
RBC: 4.77 MIL/uL (ref 4.22–5.81)
RDW: 14.5 % (ref 11.5–15.5)
WBC: 6.8 10*3/uL (ref 4.0–10.5)

## 2014-11-12 LAB — APTT: APTT: 33 s (ref 24–37)

## 2014-11-12 MED ORDER — FUROSEMIDE 20 MG PO TABS: 20.0000 mg | ORAL_TABLET | Freq: Every day | ORAL | Status: DC | PRN

## 2014-11-12 MED ORDER — APIXABAN 5 MG PO TABS
5.0000 mg | ORAL_TABLET | Freq: Two times a day (BID) | ORAL | Status: DC
Start: 2014-11-12 — End: 2014-12-10

## 2014-11-12 MED ORDER — METOPROLOL SUCCINATE ER 50 MG PO TB24: 50.0000 mg | ORAL_TABLET | Freq: Every day | ORAL | Status: DC

## 2014-11-12 MED ORDER — CLOPIDOGREL BISULFATE 75 MG PO TABS
75.0000 mg | ORAL_TABLET | Freq: Every day | ORAL | Status: DC
Start: 2014-11-12 — End: 2015-11-15

## 2014-11-12 MED ORDER — ATORVASTATIN CALCIUM 40 MG PO TABS: 40.0000 mg | ORAL_TABLET | Freq: Every day | ORAL | Status: DC

## 2014-11-12 MED ORDER — LISINOPRIL 5 MG PO TABS
5.0000 mg | ORAL_TABLET | Freq: Every day | ORAL | Status: DC
  Administered 2014-11-12: 5 mg via ORAL
  Filled 2014-11-12: qty 1

## 2014-11-12 MED ORDER — LISINOPRIL 5 MG PO TABS: 5.0000 mg | ORAL_TABLET | Freq: Every day | ORAL | Status: DC

## 2014-11-12 MED ORDER — DILTIAZEM HCL ER COATED BEADS 240 MG PO CP24: 240.0000 mg | ORAL_CAPSULE | Freq: Every day | ORAL | Status: DC

## 2014-11-12 NOTE — Progress Notes (Signed)
Subjective: He had the best night of sleep since being here.  No orthopnea.  Ambulated yesterday in the hall without difficulty.    Objective: Vital signs in last 24 hours: Temp:  [97.4 F (36.3 C)-98.8 F (37.1 C)] 98.8 F (37.1 C) (02/26 0356) Pulse Rate:  [76-87] 85 (02/26 0356) Resp:  [16-18] 18 (02/26 0356) BP: (125-148)/(65-94) 125/94 mmHg (02/26 0356) SpO2:  [96 %-99 %] 96 % (02/26 0356) Weight:  [196 lb 6.4 oz (89.086 kg)] 196 lb 6.4 oz (89.086 kg) (02/26 0702) Last BM Date: 11/11/14  Intake/Output from previous day: 02/25 0701 - 02/26 0700 In: 680 [P.O.:680] Out: 800 [Urine:800] Intake/Output this shift:    Medications Current Facility-Administered Medications  Medication Dose Route Frequency Provider Last Rate Last Dose  . acetaminophen (TYLENOL) tablet 650 mg  650 mg Oral Q4H PRN Eileen Stanford, PA-C      . apixaban Arne Cleveland) tablet 5 mg  5 mg Oral BID Wellington Hampshire, MD   5 mg at 11/11/14 2159  . atorvastatin (LIPITOR) tablet 40 mg  40 mg Oral q1800 Brittainy Erie Noe, PA-C   40 mg at 11/11/14 1750  . clopidogrel (PLAVIX) tablet 75 mg  75 mg Oral Q breakfast Wellington Hampshire, MD   75 mg at 11/11/14 1058  . diltiazem (CARDIZEM CD) 24 hr capsule 240 mg  240 mg Oral Daily Burnell Blanks, MD   240 mg at 11/11/14 1100  . furosemide (LASIX) injection 40 mg  40 mg Intravenous Daily Almyra Deforest, PA   40 mg at 11/11/14 1100  . metoprolol succinate (TOPROL-XL) 24 hr tablet 50 mg  50 mg Oral Daily Burnell Blanks, MD   50 mg at 11/11/14 1100  . ondansetron (ZOFRAN) injection 4 mg  4 mg Intravenous Q6H PRN Eileen Stanford, PA-C   4 mg at 11/09/14 0234  . vitamin C (ASCORBIC ACID) tablet 250 mg  250 mg Oral Daily Herminio Commons, MD   250 mg at 11/11/14 1100    PE: General appearance: alert, cooperative and no distress Lungs: clear to auscultation bilaterally Heart: irregularly irregular rhythm and No MRG Extremities: 1+ pitting ankle  edema Pulses: 2+ and symmetric Skin: Warm and dry Neurologic: Grossly normal  Lab Results:   Recent Labs  11/10/14 0527 11/11/14 0241 11/12/14 0357  WBC 6.2 7.1 6.8  HGB 13.9 13.6 14.6  HCT 41.4 41.2 41.6  PLT 151 159 170   BMET  Recent Labs  11/10/14 0527 11/11/14 0241 11/12/14 0357  NA 135 137 136  K 3.8 3.7 4.0  CL 103 100 100  CO2 25 24 29   GLUCOSE 95 98 89  BUN 10 10 10   CREATININE 1.19 1.20 1.27  CALCIUM 8.3* 8.4 8.4   PT/INR  Recent Labs  11/10/14 0527  LABPROT 16.2*  INR 1.29   Lipid Panel     Component Value Date/Time   CHOL 195 11/08/2014 0500   TRIG 54 11/08/2014 0500   HDL 54 11/08/2014 0500   CHOLHDL 3.6 11/08/2014 0500   VLDL 11 11/08/2014 0500   LDLCALC 130* 11/08/2014 0500   LDLDIRECT 191.1 01/04/2011 1218      Assessment/Plan  1. New onset a-fib - CHADSVASc score is at least 3 (HTN 1 age 81) - On Eliquis as of yesterday. Will consider DCCV in 3-4 weeks after place on eliquis  - Continues in Afib with well controlled rate on cardizem 240mg , toprol 50  2. Acute systolic HF -Net  Fluids:  -0.1L/-0.4L.  i do not think they are accurate.  Given IV Lasix yesterday.  Labs good.  SCr WNL  3. Cardiomyopathy - cath 11/10/2014 DES (2.75 x 18 mm Xience) to 90% mid LAD - post cath, started on plavix and eliquis without ASA.  - Echo EF 25-30%, likely related to combination of a-fib with RVR and underlying ischemia - consider d/c diltiazem given LV dysfunction but appears stable on it.  - need recheck Echo in 3 month, ?if need LifeVest  3 beat NSVT x 1.  Should be on ACE or ARB.  BP will tolerate. Lisinopril 5.   PRN lasix.  Daily weight monitoring and low sodium diet.   4. Elevated trop   5. HTN  Adding ACE 6. HLD  On lipitor 7. Mild to moderate MR  Follow with echo 8. CAD   DC home today after ambulation.      LOS: 3 days     HAGER, BRYAN PA-C 11/12/2014 7:24 AM  I have personally seen and examined this patient with Tarri Fuller, PA-C. I agree with the assessment and plan as outlined above. He is doing well this am. NO SOB. S/p DES mid LAD. On Plavix ( no ASA with use of Plavix and Eliquis). Atrial fib is rate controlled. Plan rate control and anti-coagulation. Repeat echo in 3 months. May consider DCCV in several months. Discharge today and f/u with office APP in 1 week and then me after that.   MCALHANY,CHRISTOPHER  11/12/2014 8:21 AM

## 2014-11-12 NOTE — Progress Notes (Signed)
CARDIAC REHAB PHASE I   PRE:  Rate/Rhythm: 65 afib with PVCs   BP: sitting 105/58    SaO2:   MODE:  Ambulation: 500 ft   POST:  Rate/Rhythm: 86 afib    BP: sitting 120/70     SaO2:   Tolerated well, steadier today. No c/o. Pt able to do teach back of ed from yesterday.  [phone]   Darrick Meigs CES, ACSM 11/12/2014 9:16 AM

## 2014-11-12 NOTE — Discharge Summary (Signed)
Physician Discharge Summary    Cardiologist:  McAlhany   Patient ID: Ricardo Valencia MRN: [id number] DOB/AGE: 79-Nov-1930 79 y.o.  Admit date: 11/07/2014 Discharge date: 11/12/2014  Admission Diagnoses:  Afib RVR, CAD  Discharge Diagnoses:  Principal Problem:   Atrial fibrillation with rapid ventricular response Active Problems:   Hypertension   Hyperlipidemia   CAD (coronary artery disease)   Elevated troponin   Acute systolic HF   Combined Ischemic/nonischemic cardiomyopathy   Discharged Condition: stable  Hospital Course:   Ricardo Valencia is a 81 y.o. male with a history of HTN and HLD who presented to [facility] ED with SOB and found to be in new onset afib with RVR.  He has a history of noncompliance and has not been seen by PCP and a couple years. He is not taking any medicines currently. He was in his usual state of health when he started to feel mildly short of breath. This became worse and last night he could not sleep at all due to orthopnea and PND. He also admits to fatigue and mild dizziness. No lower extremity swelling or chest pain. He has no history of exertional chest pain or shortness of breath. No family history of coronary artery disease. He has never been seen by cardiologist before.  In the emergency department he was noted to be in A. fib with RVR and started on diltiazem drip. CXR with pulm edema. BNP ~400s and troponin 0.06.   He was admitted and underwent LHC revealing severe one-vessel coronary artery disease.  Moderately to severely reduced LV systolic function.  Mildly elevated left ventricular end-diastolic pressure. He underwent successful angioplasty and drug-eluting stent placement to the mid LAD.  Plavix only with eliquis.  Diltiazem and toprol added.  He continued in Afib with good rate control.  We will reevaluate for DCCV in the office.  He was given IV lasix for acute chf and responded well.  Orthopnea resolved.  EF is 25-30%. With diffuse hypokinesis.   Mild to moderate MR.  Lisinopril added for AL reduction.  Titrate as BP will allow for optimal control.  He had a 3 beat NSVT on telemetry.  He will be discharged on PRN lasix with instructions for weight monitoring and low sodium diet.  He ambulated well with CR.  The patient was seen by Dr. Angelena Form who felt he was stable for DC home.   Consults: None  Significant Diagnostic Studies:   Echo Study Conclusions  - Left ventricle: The cavity size was normal. Wall thickness was increased in a pattern of mild LVH. Systolic function was severely reduced. The estimated ejection fraction was in the range of 25% to 30%. Diffuse hypokinesis. - Aortic valve: There was trivial regurgitation. - Mitral valve: There was mild to moderate regurgitation. - Left atrium: The atrium was mildly dilated. - Right atrium: The atrium was mildly dilated.  Impressions:  - Severe global reduction in LV function; mild LAE; mild to moderate MR; trace AI.  Cardiac Catheterization Procedure Note  Name: Ricardo Valencia MRN: [id number] DOB: 08/11/1929  Procedure: Left Heart Cath, Selective Coronary Angiography, LV angiography, PTCA and stenting of the mid LAD  Indication: Newly diagnosed cardiomyopathy with mildly elevated troponin in the setting of atrial fibrillation.  Medications: Sedation: 1 mg IV Versed, 25 mcg IV Fentanyl Contrast: 120 ml Omnipaque  Procedural Details: The right wrist was prepped, draped, and anesthetized with 1% lidocaine. Using the modified Seldinger technique, a 5 French Slender sheath was introduced into the  right radial artery. 3 mg of verapamil was administered through the sheath, weight-based unfractionated heparin was administered intravenously. A Jackie catheter was used for selective coronary angiography and left ventriculography. Catheter exchanges were performed over an exchange length guidewire. There were no immediate procedural  complications.  Procedural Findings:  Hemodynamics: AO: 136/71 mmHg LV: 134/10 mmHg LVEDP: 17 mmHg  Coronary angiography: Coronary dominance: Right   Left Main: Normal  Left Anterior Descending (LAD): Normal in size with minor irregularities in the proximal segment. There is a 90% discrete stenosis in the midsegment just before the origin of the second diagonal. The rest of the vessel has minor irregularities and wraps around the apex.  1st diagonal (D1): Normal in size with 40% proximal stenosis.  2nd diagonal (D2): Normal in size with 20% ostial stenosis.  3rd diagonal (D3): Small in size with minor irregularities.  Circumflex (LCx): Normal in size with minor irregularities.  1st obtuse marginal: Small in size with minor irregularities.  2nd obtuse marginal: Medium in size with minor irregularities.  3rd obtuse marginal: Medium in size with minor irregularities.   Right Coronary Artery: Large in size and dominant. The vessel has minor irregularities. There is 50% stenosis distally before the bifurcation  Posterior descending artery: Large in size with no significant disease.  Left ventriculography: Left ventricular systolic function is moderately to severely reduced , LVEF is estimated at 30-35 %, there is no significant mitral regurgitation   PCI Note: Following the diagnostic procedure, the decision was made to proceed with PCI. Weight-based bivalirudin was given for anticoagulation. Once a therapeutic ACT was achieved, a 6 [place] XB LAD 3.5 guide catheter was inserted. A run through coronary guidewire was used to cross the lesion. The lesion was predilated with a 2.5 x 12 balloon. The lesion was then stented with a 2.75 x 18 mm Xience drug-eluting stent. The stent was postdilated with a 3.0 x 12 mm noncompliant balloon. The stent jailed the second diagonal but did not affect ostial stenosis. There was normal flow. Following PCI, there was  0% residual stenosis and TIMI-3 flow. Final angiography confirmed an excellent result. The patient tolerated the procedure well. There were no immediate procedural complications. A TR band was used for radial hemostasis. The patient was transferred to the post catheterization recovery area for further monitoring.  PCI Data: Vessel - LAD/Segment - mid Percent Stenosis (pre) 90% eccentric TIMI-flow 3 Stent : 2.75 x 18 mm Xience drug-eluting stent Percent Stenosis (post) 0% TIMI-flow (post) 3   Final Conclusions:  1. Severe one-vessel coronary artery disease. 2. Moderately to severely reduced LV systolic function. 3. Mildly elevated left ventricular end-diastolic pressure. 4. Successful angioplasty and drug-eluting stent placement to the mid LAD  Recommendations:  Given that the patient has atrial fibrillation, recommend treatment with Plavix and Eliquis without aspirin. Treatment with Eliquis can be started tomorrow morning if no bleeding complications. Continue rate control for atrial fibrillation and proceed with cardioversion in 3-4 weeks as planned.  Kathlyn Sacramento MD, [facility] 11/10/2014, 5:59 PM  Treatments: See above  Discharge Exam: Blood pressure 140/69, pulse 61, temperature 98.9 F (37.2 C), temperature source Oral, resp. rate 18, height 5\' 9"  (1.753 m), weight 196 lb 6.4 oz (89.086 kg), SpO2 97 %.   Disposition: Final discharge disposition not confirmed      Discharge Instructions    Amb Referral to Cardiac Rehabilitation    Complete by:  As directed             Medication  List    STOP taking these medications        aspirin 325 MG tablet     lisinopril-hydrochlorothiazide 10-12.5 MG per tablet  Commonly known as:  PRINZIDE,ZESTORETIC      TAKE these medications        apixaban 5 MG Tabs tablet  Commonly known as:  ELIQUIS  Take 1 tablet (5 mg total) by mouth 2 (two) times daily.     atorvastatin 40 MG tablet  Commonly known as:  LIPITOR  Take 1  tablet (40 mg total) by mouth daily at 6 PM.     clopidogrel 75 MG tablet  Commonly known as:  PLAVIX  Take 1 tablet (75 mg total) by mouth daily with breakfast.     diltiazem 240 MG 24 hr capsule  Commonly known as:  CARDIZEM CD  Take 1 capsule (240 mg total) by mouth daily.     furosemide 20 MG tablet  Commonly known as:  LASIX  Take 1 tablet (20 mg total) by mouth daily as needed.     lisinopril 5 MG tablet  Commonly known as:  PRINIVIL,ZESTRIL  Take 1 tablet (5 mg total) by mouth daily.     metoprolol succinate 50 MG 24 hr tablet  Commonly known as:  TOPROL-XL  Take 1 tablet (50 mg total) by mouth daily. Take with or immediately following a meal.     vitamin C 100 MG tablet  Take 100 mg by mouth daily.       Follow-up Information    Follow up with Richardson Dopp, PA-C On 11/26/2014.   Specialty:  Physician Assistant   Why:  11:30 AM   Contact information:   [address] [place] [address] [phone]      Greater than 30 minutes was spent completing the patient's discharge.    SignedTarri Fuller, [place] 11/12/2014, 9:03 AM

## 2014-11-12 NOTE — Care Management Note (Signed)
    Page 1 of 1   11/12/2014     4:57:27 PM CARE MANAGEMENT NOTE 11/12/2014  Patient:  Ricardo Valencia, Ricardo Valencia   Account Number:  [id number]  Date Initiated:  11/08/2014  Documentation initiated by:  Marvetta Gibbons  Subjective/Objective Assessment:   Pt admitted with afib     Action/Plan:   PTA pt lived at  home with wife   Anticipated DC Date:  11/09/2014   Anticipated DC Plan:  [place]  CM consult  Medication Assistance      Choice offered to / List presented to:             Status of service:  Completed, signed off Medicare Important Message given?  YES (If response is "NO", the following Medicare IM given date fields will be blank) Date Medicare IM given:  11/12/2014 Medicare IM given by:  Maille Halliwell Date Additional Medicare IM given:   Additional Medicare IM given by:    Discharge Disposition:  HOME/SELF CARE  Per UR Regulation:  Reviewed for med. necessity/level of care/duration of stay  If discussed at [place] of Stay Meetings, dates discussed:    Comments:  11/08/14- 1700- Marvetta Gibbons RN, BSN [phone] Referral for Eliquis benefit check- attempted check- could not find pt's drug coverage- spoke with pt at bedside- at see if pt had info on his drug coverage- per pt he does have drug coverage benefits but does not have the info or card on him here at hospital- pt states that he would prefer to f/u on this when he gets home- instructed pt on the importance of finding out coverage copay cost and if any MD authorization needed for Eliquis- pt reports that he will f/u on this with his insurance and MD office- pt given 30 day free card for Eliquis.

## 2014-11-16 ENCOUNTER — Telehealth: Payer: Self-pay | Admitting: Physician Assistant

## 2014-11-16 ENCOUNTER — Telehealth: Payer: Self-pay | Admitting: *Deleted

## 2014-11-16 NOTE — Telephone Encounter (Signed)
Wife calling stating Mr. Nedeau was d/c from hospital last Friday 2/26.  States since d/c he has been tired.  Denies SOB, CP,and  no swelling in legs. States he is walking 10-15 min 3 x a day.  Advised that it is not unusual for him to still be experiencing being tired due to his AFib and having cath. Has an appointment with Margaret Pyle on 3/11 at 11:30.  She verbalizes understanding regarding all of his meds.  Advised if she has further concerns to call back.

## 2014-11-16 NOTE — Telephone Encounter (Signed)
Sent message to Triage to evaluate pt.

## 2014-11-16 NOTE — Telephone Encounter (Signed)
New message      Pt was discharged from hosp on last Friday.  Pt is very tired.  Is this normal?

## 2014-11-16 NOTE — Telephone Encounter (Signed)
-----   Message from Michae Kava, [place] sent at 11/16/2014  4:19 PM EST ----- Regarding: PT CALL  Hi Ladies,  So this message below that was sent to me today. Pt has never seen [facility] yet. They have an upcoming appt.They just had a cath looks like with Arida. I think this probably should have gone to Triage. Let me know if I can do anything to help on this.   Thank you Arbie Cookey   Call Documentation     Earnestine Mealing at 11/16/2014 3:18 PM    Status: Signed      Expand All Collapse All    New message Pt was discharged from [place] on last Friday. Pt is very tired. Is this normal?

## 2014-11-25 ENCOUNTER — Encounter: Payer: Self-pay | Admitting: Physician Assistant

## 2014-11-25 ENCOUNTER — Ambulatory Visit (INDEPENDENT_AMBULATORY_CARE_PROVIDER_SITE_OTHER): Payer: Medicare Other | Admitting: Physician Assistant

## 2014-11-25 VITALS — BP 124/72 | HR 81 | Ht 69.0 in | Wt 194.0 lb

## 2014-11-25 DIAGNOSIS — I2583 Coronary atherosclerosis due to lipid rich plaque: Secondary | ICD-10-CM

## 2014-11-25 DIAGNOSIS — I10 Essential (primary) hypertension: Secondary | ICD-10-CM | POA: Diagnosis not present

## 2014-11-25 DIAGNOSIS — I4819 Other persistent atrial fibrillation: Secondary | ICD-10-CM

## 2014-11-25 DIAGNOSIS — I2510 Atherosclerotic heart disease of native coronary artery without angina pectoris: Secondary | ICD-10-CM

## 2014-11-25 DIAGNOSIS — I481 Persistent atrial fibrillation: Secondary | ICD-10-CM

## 2014-11-25 DIAGNOSIS — I4891 Unspecified atrial fibrillation: Secondary | ICD-10-CM | POA: Insufficient documentation

## 2014-11-25 DIAGNOSIS — E785 Hyperlipidemia, unspecified: Secondary | ICD-10-CM

## 2014-11-25 DIAGNOSIS — I255 Ischemic cardiomyopathy: Secondary | ICD-10-CM

## 2014-11-25 LAB — BASIC METABOLIC PANEL
BUN: 17 mg/dL (ref 6–23)
CALCIUM: 8.9 mg/dL (ref 8.4–10.5)
CO2: 30 mEq/L (ref 19–32)
CREATININE: 1.39 mg/dL (ref 0.40–1.50)
Chloride: 102 mEq/L (ref 96–112)
GFR: 51.83 mL/min — ABNORMAL LOW (ref >60.00)
Glucose, Bld: 89 mg/dL (ref 70–99)
Potassium: 4.4 mEq/L (ref 3.5–5.1)
Sodium: 136 mEq/L (ref 135–145)

## 2014-11-25 NOTE — Assessment & Plan Note (Addendum)
Pedis post drug eluting stent to the mid LAD 11/10/2014.  Patient is here for follow-up.  No complaints of CP but has experienced a little bit of dizziness periodically after taking medications.   Continue plavix and eliquis.  No ASA.

## 2014-11-25 NOTE — Assessment & Plan Note (Signed)
Patient continues in atrial fibrillation his rate is well controlled and appears asymptomatic. Repeated echocardiogram in about 2 months. Consider DC CV at that time.

## 2014-11-25 NOTE — Patient Instructions (Signed)
Your physician has recommended you make the following change in your medication:  1) TAKE Lasix as needed if you are up 3lbs in one day or 5lbs in 1 week  Lab Today: Bmet  Your physician has requested that you have an echocardiogram. Echocardiography is a painless test that uses sound waves to create images of your heart. It provides your doctor with information about the size and shape of your heart and how well your heart's chambers and valves are working. This procedure takes approximately one hour. There are no restrictions for this procedure. (To be scheduled around 01/06/15)  Your physician recommends that you return for a FASTING lipid profile and lft in 3weeks  Your physician recommends that you schedule a follow-up appointment after echo with Dr.Mcalhany  Your physician recommends that you weigh, daily, at the same time every day, and in the same amount of clothing. Please record your daily weights .  If you are having dizziness measure your blood pressure and call the office

## 2014-11-25 NOTE — Assessment & Plan Note (Signed)
Continue statin. LFTs and lipid panel in 3 weeks

## 2014-11-25 NOTE — Assessment & Plan Note (Signed)
Well-controlled. No change in therapy. The patient has another episode of dizziness he will check his blood pressure.

## 2014-11-25 NOTE — Assessment & Plan Note (Signed)
Patient appears euvolemic, he's been experiencing some dizziness periodically but not necessarily with position change. He has been taken Lasix daily instead of as needed like we discussed in the hospital. Will decrease it to as needed.  We discussed daily weight monitoring, low-sodium diet. He will take Lasix if begins 3 pounds in 24 hours or 5 pounds in one week.  Check a basic metabolic panel today.  Echocardio gram in 2 months.

## 2014-11-25 NOTE — Progress Notes (Signed)
Patient ID: Ricardo Valencia, male   DOB: [birth year], 81 y.o.   MRN: [id number]    Date:  11/25/2014   ID:  Ricardo Valencia, DOB 06-Jun-1929, MRN [id number]  PCP:  Eulas Post, MD  Primary Cardiologist:  Angelena Form   Chief complaint: Posthospital follow-up   History of Present Illness: Ricardo Valencia is a 81 y.o. male with a history of HTN and HLD who presented to [facility] ED on Nov 07, 2014 with SOB and found to be in new onset afib with RVR. He has a history of noncompliance and has not been seen by PCP and a couple years. He was not taking any medicines. He was in his usual state of health when he started to feel mildly short of breath. This became worse with orthopnea and PND. He also admited to fatigue and mild dizziness. No lower extremity swelling or chest pain. He has no history of exertional chest pain or shortness of breath. No family history of coronary artery disease. He has never been seen by cardiologist before. In the emergency department he was noted to be in A. fib with RVR and started on diltiazem drip. CXR with pulm edema. BNP ~400s and troponin 0.06.  He was admitted and underwent LHC revealing severe one-vessel coronary artery disease. Moderately to severely reduced LV systolic function. Mildly elevated left ventricular end-diastolic pressure. He underwent successful angioplasty and drug-eluting stent placement to the mid LAD. Plavix only with eliquis. Diltiazem and toprol added. He continued in Afib with good rate control. He was given IV lasix for acute chf and responded well. Orthopnea resolved. EF is 25-30%. With diffuse hypokinesis. Mild to moderate MR. Lisinopril added for afterload reduction. Titrate as BP will allow for optimal control. He had a 3 beat NSVT on telemetry. He was discharged on PRN lasix with instructions for weight monitoring and low sodium diet.   He presents today for posthospital follow-up.  He has been taking the Lasix on  a daily basis instead of as needed. He has reported some occasional dizziness particularly after taking medications, but not necessarily with position change. He has been taking all other medications as directed. He is weighing himself daily and his weight has been stable.  He is drinking a lot of water  The patient currently denies nausea, vomiting, fever, chest pain, shortness of breath, orthopnea, PND, cough, congestion, abdominal pain, hematochezia, melena, lower extremity edema, claudication.  Wt Readings from Last 3 Encounters:  11/25/14 194 lb (87.998 kg)  11/12/14 196 lb 6.4 oz (89.086 kg)  08/16/11 196 lb (88.905 kg)     Past Medical History  Diagnosis Date  . Allergy     hay fever, allergies  . Hyperlipidemia   . Hypertension   . CAD (coronary artery disease)     DES to mid LAD 11/10/2014  . Atrial fibrillation 11/07/2014    Current Outpatient Prescriptions  Medication Sig Dispense Refill  . apixaban (ELIQUIS) 5 MG TABS tablet Take 1 tablet (5 mg total) by mouth 2 (two) times daily. 60 tablet 11  . Ascorbic Acid (VITAMIN C) 100 MG tablet Take 100 mg by mouth daily.      Marland Kitchen atorvastatin (LIPITOR) 40 MG tablet Take 1 tablet (40 mg total) by mouth daily at 6 PM. 30 tablet 11  . clopidogrel (PLAVIX) 75 MG tablet Take 1 tablet (75 mg total) by mouth daily with breakfast. 30 tablet 11  . diltiazem (CARDIZEM CD) 240 MG 24 hr capsule Take 1 capsule (240  mg total) by mouth daily. 30 capsule 11  . furosemide (LASIX) 20 MG tablet Take 1 tablet (20 mg total) by mouth daily as needed. 30 tablet 11  . lisinopril (PRINIVIL,ZESTRIL) 5 MG tablet Take 1 tablet (5 mg total) by mouth daily. 30 tablet 11  . metoprolol succinate (TOPROL-XL) 50 MG 24 hr tablet Take 1 tablet (50 mg total) by mouth daily. Take with or immediately following a meal. 30 tablet 11   No current facility-administered medications for this visit.    Allergies:   No Known Allergies  Social History:  The patient  reports  that he has never smoked. He does not have any smokeless tobacco history on file.   Family history:   Family History  Problem Relation Age of Onset  . Cancer Mother     breast  . Cancer Father     colon    ROS:  Please see the history of present illness.  All other systems reviewed and negative.   PHYSICAL EXAM: VS:  BP 124/72 mmHg  Pulse 81  Ht 5\' 9"  (1.753 m)  Wt 194 lb (87.998 kg)  BMI 28.64 kg/m2 Well nourished, well developed, in no acute distress HEENT: Pupils are equal round react to light accommodation extraocular movements are intact.  Neck: no JVDNo cervical lymphadenopathy. Cardiac: Irregular rate and rhythm without murmurs rubs or gallops. Lungs:  clear to auscultation bilaterally, no wheezing, rhonchi or rales Abd: soft, nontender, positive bowel sounds all quadrants, no hepatosplenomegaly Ext: no lower extremity edema.  2+ radial and dorsalis pedis pulses. Skin: warm and dry Neuro:  Grossly normal  EKG:  Atrophic relation with a rate of 81 bpm   ASSESSMENT AND PLAN:  Problem List Items Addressed This Visit    Ischemic cardiomyopathy - Primary    Patient appears euvolemic, he's been experiencing some dizziness periodically but not necessarily with position change. He has been taken Lasix daily instead of as needed like we discussed in the hospital. Will decrease it to as needed.  We discussed daily weight monitoring, low-sodium diet. He will take Lasix if begins 3 pounds in 24 hours or 5 pounds in one week.  Check a basic metabolic panel today.  Echocardio gram in 2 months.      Relevant Orders   EKG 12-Lead   2D Echocardiogram without contrast   Basic metabolic panel (Completed)   Hypertension    Well-controlled. No change in therapy. The patient has another episode of dizziness he will check his blood pressure.      Hyperlipidemia    Continue statin. LFTs and lipid panel in 3 weeks      Relevant Orders   Lipid panel   Hepatic function panel   CAD  (coronary artery disease)    Pedis post drug eluting stent to the mid LAD 11/10/2014.  Patient is here for follow-up.  No complaints of CP but has experienced a little bit of dizziness periodically after taking medications.   Continue plavix and eliquis.  No ASA.        Atrial fibrillation    Patient continues in atrial fibrillation his rate is well controlled and appears asymptomatic. Repeated echocardiogram in about 2 months. Consider DC CV at that time.

## 2014-11-26 ENCOUNTER — Encounter: Payer: Medicare Other | Admitting: Physician Assistant

## 2014-12-01 ENCOUNTER — Telehealth: Payer: Self-pay | Admitting: Cardiovascular Disease

## 2014-12-01 NOTE — Telephone Encounter (Signed)
lmtcb

## 2014-12-01 NOTE — Telephone Encounter (Signed)
New Msg       Please return call to pt's daughter Amy.   Daughter is concerned that father isn't clear on what he should do since he's had stint placed.

## 2014-12-02 NOTE — Telephone Encounter (Addendum)
Pt signed DPR 3/16 okay to talk to daughter. Spoke with daughter, Amy as she is worried that father is not sharing all of his symptoms with the Dr. When he comes for his visits.  Pt has c/o to her that he is having issues with SOB at night and not able to sleep in bed having to sleep sitting up.  Daughter will be in town next week and would like to come to the office with the pt and talk through the symptoms with the Dr. Jaymes Graff nurse.  During the visit she want to talk about his diet and medication assistance programs. Amy will be in town Thursday 3/24 and Friday 3/25 if we can schedule appt during that time.  Tried to contact pt on home and cell numbers, left message to call back.  Will try again later.   Spoke with Amy made aware that we need to speak to her dad in order to best evaluate his SOB and sluggishness with medications.  Instructed her that we can setup appointment sooner than end of April once we talk with him and evaluate symptoms. Going to send Dietary education to pt home on 3/21 for review by pt and and daughter. Also mailed paper work for medication assistance programs.

## 2014-12-02 NOTE — Telephone Encounter (Signed)
Follow up      Returning a nurses call.   Please call before 10:30 this am

## 2014-12-06 ENCOUNTER — Telehealth: Payer: Self-pay | Admitting: Cardiovascular Disease

## 2014-12-06 ENCOUNTER — Telehealth: Payer: Self-pay | Admitting: Family Medicine

## 2014-12-06 NOTE — Telephone Encounter (Signed)
Spoke with pt's daughter who states she lives in [place] and will be here this Thursday afternoon and Friday. She is concerned pt does not understand treatment plan.  She also has multiple questions she would like to discuss with provider. Requesting appt for Thursday or Friday.  She states pt is nauseated at times and has shortness of breath at times.  She is also asking who Dr. Angelena Form would recommend for primary care.  I told her he often recommends Dr. Ronnald Ramp at [facility] made for pt to see Cecilie Kicks, PA on December 10, 2014 at 9:00.  Pt is scheduled for fasting lab work on 12/16/14 and daughter would like to have done at 3/25 appt.  Pt will come to this appt fasting.

## 2014-12-06 NOTE — Telephone Encounter (Signed)
Thanks, chris 

## 2014-12-06 NOTE — Telephone Encounter (Signed)
New Message  Pt daughter calling to speak w/ Rn about pt's condition. Please call back and discuss.

## 2014-12-10 ENCOUNTER — Encounter: Payer: Self-pay | Admitting: Cardiology

## 2014-12-10 ENCOUNTER — Ambulatory Visit (INDEPENDENT_AMBULATORY_CARE_PROVIDER_SITE_OTHER): Payer: Medicare Other | Admitting: Cardiology

## 2014-12-10 VITALS — BP 132/70 | HR 100 | Ht 69.0 in | Wt 188.8 lb

## 2014-12-10 DIAGNOSIS — I2510 Atherosclerotic heart disease of native coronary artery without angina pectoris: Secondary | ICD-10-CM

## 2014-12-10 DIAGNOSIS — I10 Essential (primary) hypertension: Secondary | ICD-10-CM | POA: Diagnosis not present

## 2014-12-10 DIAGNOSIS — I481 Persistent atrial fibrillation: Secondary | ICD-10-CM | POA: Diagnosis not present

## 2014-12-10 DIAGNOSIS — I4819 Other persistent atrial fibrillation: Secondary | ICD-10-CM

## 2014-12-10 DIAGNOSIS — I255 Ischemic cardiomyopathy: Secondary | ICD-10-CM

## 2014-12-10 DIAGNOSIS — E785 Hyperlipidemia, unspecified: Secondary | ICD-10-CM | POA: Diagnosis not present

## 2014-12-10 DIAGNOSIS — I5040 Unspecified combined systolic (congestive) and diastolic (congestive) heart failure: Secondary | ICD-10-CM

## 2014-12-10 LAB — LIPID PANEL
Cholesterol: 124 mg/dL (ref 0–200)
HDL: 53 mg/dL (ref >=40)
LDL CALC: 56 mg/dL (ref 0–99)
Total CHOL/HDL Ratio: 2.3 Ratio
Triglycerides: 74 mg/dL (ref <150)
VLDL: 15 mg/dL (ref 0–40)

## 2014-12-10 LAB — HEPATIC FUNCTION PANEL
ALT: 60 U/L — AB (ref 0–53)
AST: 37 U/L (ref 0–37)
Albumin: 3.8 g/dL (ref 3.5–5.2)
Alkaline Phosphatase: 73 U/L (ref 39–117)
BILIRUBIN DIRECT: 0.2 mg/dL (ref 0.0–0.3)
BILIRUBIN INDIRECT: 0.8 mg/dL (ref 0.2–1.2)
BILIRUBIN TOTAL: 1 mg/dL (ref 0.2–1.2)
Total Protein: 6.8 g/dL (ref 6.0–8.3)

## 2014-12-10 MED ORDER — APIXABAN 5 MG PO TABS: 5.0000 mg | ORAL_TABLET | Freq: Two times a day (BID) | ORAL | Status: DC

## 2014-12-10 NOTE — Patient Instructions (Signed)
Your physician recommends that you continue on your current medications as directed. Please refer to the Current Medication list given to you today.   LAB WORK TODAY   NEEDS APPT WITH COUMADIN TO DISCUSS ELIQUIS AND COST

## 2014-12-10 NOTE — Progress Notes (Signed)
Cardiology Office Note   Date:  12/10/2014   ID:  Ricardo Valencia, DOB December 24, 1928, MRN [id number]  PCP:  No PCP Per Patient  Cardiologist:  Dr. Angelena Form     Chief Complaint  Patient presents with  . Atrial Fibrillation    asked to be seen to discuss, a fib, CHF, CAD.      History of Present Illness: Ricardo PETTET is a 81 y.o. male who presents for evaluation and discussion on his a fib, CHF with decreased EF and CAD.  Recent admit for a fib with RVR, and CHF with EF 25-30%, treated with diuresis, control of a fib and cardiac cath with stent to mLAD.  He has not needed lasix since discharge.  No chest pain and no SOB.  He was feeling weak after his plavix and eliquis.  He is concerned about his meds and the cost of eliquis is $500.00 per month.   We discussed BP and meds in detail.  He will see pharmacy here to discuss options, OK to go to coumadin if need to.  He is fatigued which may be more to his a fib which we discussed.  No chest pain and no SOB.    Past Medical History  Diagnosis Date  . Allergy     hay fever, allergies  . Hyperlipidemia   . Hypertension   . CAD (coronary artery disease)     DES to mid LAD 11/10/2014  . Atrial fibrillation 11/07/2014    Past Surgical History  Procedure Laterality Date  . Appendectomy  1947  . Left heart catheterization with coronary angiogram N/A 11/10/2014    Procedure: LEFT HEART CATHETERIZATION WITH CORONARY ANGIOGRAM;  Surgeon: Wellington Hampshire, MD;  Location: [facility] CATH LAB;  Service: Cardiovascular;  Laterality: N/A;  . Percutaneous coronary stent intervention (pci-s)  11/10/2014    Procedure: PERCUTANEOUS CORONARY STENT INTERVENTION (PCI-S);  Surgeon: Wellington Hampshire, MD;  Location: [facility] CATH LAB;  Service: Cardiovascular;;     Current Outpatient Prescriptions  Medication Sig Dispense Refill  . apixaban (ELIQUIS) 5 MG TABS tablet Take 1 tablet (5 mg total) by mouth 2 (two) times daily. 60 tablet 0  . Ascorbic Acid  (VITAMIN C) 100 MG tablet Take 100 mg by mouth daily.      Marland Kitchen atorvastatin (LIPITOR) 40 MG tablet Take 1 tablet (40 mg total) by mouth daily at 6 PM. 30 tablet 11  . clopidogrel (PLAVIX) 75 MG tablet Take 1 tablet (75 mg total) by mouth daily with breakfast. 30 tablet 11  . diltiazem (CARDIZEM CD) 240 MG 24 hr capsule Take 1 capsule (240 mg total) by mouth daily. 30 capsule 11  . furosemide (LASIX) 20 MG tablet Take 1 tablet (20 mg total) by mouth daily as needed. 30 tablet 11  . lisinopril (PRINIVIL,ZESTRIL) 5 MG tablet Take 1 tablet (5 mg total) by mouth daily. 30 tablet 11  . metoprolol succinate (TOPROL-XL) 50 MG 24 hr tablet Take 1 tablet (50 mg total) by mouth daily. Take with or immediately following a meal. 30 tablet 11   No current facility-administered medications for this visit.    Allergies:   Review of patient's allergies indicates no known allergies.    Social History:  The patient  reports that he has never smoked. He does not have any smokeless tobacco history on file.   Family History:  The patient's family history includes Cancer in his father and mother.    ROS:  General:no colds or  fevers, + weight loss Skin:no rashes or ulcers HEENT:no blurred vision, no congestion CV:see HPI PUL:see HPI GI:no diarrhea constipation or melena, no indigestion GU:no hematuria, no dysuria MS:no joint pain, no claudication Neuro:no syncope, no lightheadedness Endo:no diabetes, no thyroid disease    Wt Readings from Last 3 Encounters:  12/10/14 188 lb 12.8 oz (85.639 kg)  11/25/14 194 lb (87.998 kg)  11/12/14 196 lb 6.4 oz (89.086 kg)     PHYSICAL EXAM: VS:  BP 132/70 mmHg  Pulse 100  Ht 5\' 9"  (1.753 m)  Wt 188 lb 12.8 oz (85.639 kg)  BMI 27.87 kg/m2 , BMI Body mass index is 27.87 kg/(m^2). General:Pleasant affect, NAD Skin:Warm and dry, brisk capillary refill HEENT:normocephalic, sclera clear, mucus membranes moist Neck:supple, no JVD, no bruits  Heart:S1S2 RRR without  murmur, gallup, rub or click Lungs:clear without rales, rhonchi, or wheezes YIR:SWNI, non tender, + BS, do not palpate liver spleen or masses Ext:no lower ext edema, 2+ pedal pulses, 2+ radial pulses Neuro:alert and oriented X 3, MAE, follows commands, + facial symmetry    EKG:  EKG is not ordered today.    Recent Labs: 11/07/2014: B Natriuretic Peptide 464.5*; Magnesium 2.0; TSH 3.733 11/12/2014: Hemoglobin 14.6; Platelets 170 11/25/2014: BUN 17; Creatinine 1.39; Potassium 4.4; Sodium 136 12/10/2014: ALT 60*    Lipid Panel    Component Value Date/Time   CHOL 124 12/10/2014 1043   TRIG 74 12/10/2014 1043   HDL 53 12/10/2014 1043   CHOLHDL 2.3 12/10/2014 1043   VLDL 15 12/10/2014 1043   LDLCALC 56 12/10/2014 1043   LDLDIRECT 191.1 01/04/2011 1218       Other studies Reviewed: Additional studies/ records that were reviewed today include: cardiac cath, labs, hospital note and OV.   ASSESSMENT AND PLAN:   Atrial fibrillation with rapid ventricular response, now rate controlled, no EKG today but irreg irreg.  Last progress notes stated DCCV in 3-4 weeks post op.  Pt has not missed eliquis and we discussed importance of not missing especially with plan for DCCV.   I believe he would have improved energy back in SR.   Anticoagulation- eliquis, CHA2DS2VASc score 5. Continue   Hypertension- they will call if systolic BP > 627 Consistently   Hyperlipidemia- treated on lipitor 40 mg   CAD (coronary artery disease)- new stent to LAD- stable and no chest pain.    Acute systolic HF- resolved euvolemic today   Combined Ischemic/nonischemic cardiomyopathy -EF 25-30% with mild to moderate MR  At cath 30-35%.  Will need repeat Echo in 3 months but  EF should improve with SR.  Will send Dr. Angelena Form note asking if we can plan DCCV in next 1-2 weeks.  He has been anticoagulated since 11/12/14 .  Pt and family had good understanding of meds at end of visit. Also discussed no ETOH  for now at least, ice cream is ok but small amounts.    Current medicines are reviewed with the patient today.  The patient Has no concerns regarding medicines.  The following changes have been made:  See above Labs/ tests ordered today include:see above  Disposition:   FU:  see above  Lennie Muckle, NP  12/10/2014 3:08 PM    [facility] [address], [place], [place], [place] Phone: [phone]; Fax: [phone]

## 2014-12-14 ENCOUNTER — Ambulatory Visit (INDEPENDENT_AMBULATORY_CARE_PROVIDER_SITE_OTHER): Payer: Medicare Other | Admitting: Pharmacist

## 2014-12-14 ENCOUNTER — Other Ambulatory Visit (INDEPENDENT_AMBULATORY_CARE_PROVIDER_SITE_OTHER): Payer: Medicare Other | Admitting: *Deleted

## 2014-12-14 ENCOUNTER — Telehealth: Payer: Self-pay | Admitting: *Deleted

## 2014-12-14 ENCOUNTER — Encounter: Payer: Self-pay | Admitting: *Deleted

## 2014-12-14 DIAGNOSIS — Z79899 Other long term (current) drug therapy: Secondary | ICD-10-CM

## 2014-12-14 DIAGNOSIS — I4891 Unspecified atrial fibrillation: Secondary | ICD-10-CM | POA: Diagnosis not present

## 2014-12-14 LAB — CBC WITH DIFFERENTIAL/PLATELET
Basophils Absolute: 0.1 10*3/uL (ref 0.0–0.1)
Basophils Relative: 1 % (ref 0.0–3.0)
EOS ABS: 0.3 10*3/uL (ref 0.0–0.7)
EOS PCT: 5 % (ref 0.0–5.0)
HEMATOCRIT: 42.9 % (ref 39.0–52.0)
HEMOGLOBIN: 14.6 g/dL (ref 13.0–17.0)
LYMPHS ABS: 1.5 10*3/uL (ref 0.7–4.0)
Lymphocytes Relative: 22.9 % (ref 12.0–46.0)
MCHC: 34.1 g/dL (ref 30.0–36.0)
MCV: 85.3 fl (ref 78.0–100.0)
Monocytes Absolute: 0.5 10*3/uL (ref 0.1–1.0)
Monocytes Relative: 7.1 % (ref 3.0–12.0)
NEUTROS ABS: 4.2 10*3/uL (ref 1.4–7.7)
NEUTROS PCT: 64 % (ref 43.0–77.0)
Platelets: 169 10*3/uL (ref 150.0–400.0)
RBC: 5.03 Mil/uL (ref 4.22–5.81)
RDW: 15.5 % (ref 11.5–15.5)
WBC: 6.5 10*3/uL (ref 4.0–10.5)

## 2014-12-14 LAB — BASIC METABOLIC PANEL
BUN: 12 mg/dL (ref 6–23)
CALCIUM: 8.7 mg/dL (ref 8.4–10.5)
CO2: 30 mEq/L (ref 19–32)
Chloride: 103 mEq/L (ref 96–112)
Creatinine, Ser: 1.26 mg/dL (ref 0.40–1.50)
GFR: 58.04 mL/min — ABNORMAL LOW (ref >60.00)
Glucose, Bld: 107 mg/dL — ABNORMAL HIGH (ref 70–99)
Potassium: 3.9 mEq/L (ref 3.5–5.1)
Sodium: 137 mEq/L (ref 135–145)

## 2014-12-14 NOTE — Telephone Encounter (Signed)
Thanks Pat

## 2014-12-14 NOTE — Patient Instructions (Signed)
Continue Eliquis 5mg  twice a day until we see you again.  At that time, we will talk about switching to Coumadin to help with the cost.   If your other medications are too expensive, the other one that may be able to change would be metoprolol succinate.  Just let us know if you need [place] to change this.

## 2014-12-14 NOTE — Telephone Encounter (Signed)
-----   Message from Isaiah Serge, NP sent at 12/10/2014  4:58 PM EDT ----- Cholesterol improved, will need to recheck hepatic level in 2 weeks, one level elevated so will want to see if comes down.  Thanks.

## 2014-12-14 NOTE — Telephone Encounter (Signed)
Pt to be scheduled for cardioversion.  This was discussed with pt at recent office visit with Cecilie Kicks, NP.  I spoke with pt and he would like to proceed.  Cardioversion scheduled for December 21, 2014 with Dr. Johnsie Cancel.  Pt to have CBC and BMP done on December 16, 2014.  I verbally went over all instructions with pt and will leave printed copy of instructions at front desk for him to pick up when here for lab work.

## 2014-12-15 ENCOUNTER — Telehealth: Payer: Self-pay | Admitting: Cardiovascular Disease

## 2014-12-15 NOTE — Progress Notes (Signed)
HPI  Mr. Ricardo Valencia is an 81 yo pt of Dr. Angelena Form who came in today to discuss his medications and see if there were any cheaper alternatives.  He is accompanied by his wife.  He has Medicare part A and B but does not have any prescription medication coverage.  He was hospitalized at the end of February with new onset Afib and PCI to the mid LAD. He was on no medications prior to this but was then discharged with 7 new prescriptions.  Pt states to get all of his prescriptions other than the Eliquis, it was only ~$140 and this was doable however the Eliquis was >$400 a month.  He was given a card for the first 30 days free but has concerns moving forward.    Current Outpatient Prescriptions  Medication Sig Dispense Refill  . apixaban (ELIQUIS) 5 MG TABS tablet Take 1 tablet (5 mg total) by mouth 2 (two) times daily. 60 tablet 0  . Ascorbic Acid (VITAMIN C) 100 MG tablet Take 100 mg by mouth daily.      Marland Kitchen atorvastatin (LIPITOR) 40 MG tablet Take 1 tablet (40 mg total) by mouth daily at 6 PM. 30 tablet 11  . clopidogrel (PLAVIX) 75 MG tablet Take 1 tablet (75 mg total) by mouth daily with breakfast. 30 tablet 11  . diltiazem (CARDIZEM CD) 240 MG 24 hr capsule Take 1 capsule (240 mg total) by mouth daily. 30 capsule 11  . furosemide (LASIX) 20 MG tablet Take 1 tablet (20 mg total) by mouth daily as needed. 30 tablet 11  . lisinopril (PRINIVIL,ZESTRIL) 5 MG tablet Take 1 tablet (5 mg total) by mouth daily. 30 tablet 11  . metoprolol succinate (TOPROL-XL) 50 MG 24 hr tablet Take 1 tablet (50 mg total) by mouth daily. Take with or immediately following a meal. 30 tablet 11   No current facility-administered medications for this visit.   Assessment and Plan  1.  Atrial Fibrillation- Pt has a DCCV planned for April 5th.  Will not be able to change Eliquis to Coumadin for the next 5 weeks.  He will not qualify for patient assistance given that he is eligible for insurance but just did not sign up for it.  I  have given him another 4 weeks of samples to get him through the month after cardioversion then we will switch to Coumadin.  He is agreeable to this plan.  Only other cost saving method may be to change metoprolol succinate to metoprolol tartrate.  Pt states he will keep it like it is for now then consider changing in the future if cost becomes an issue.  Suggested he follow up with Social Security office about getting a part D plan.

## 2014-12-15 NOTE — Telephone Encounter (Signed)
New Message    Patient has an out patient procedure that is scheduled for Tuesday April 5 @ 1pm and patient states that there is a conflict of interest for him and want to r/s for 12/23/14 or 12/24/14/ . Please give patient a call.

## 2014-12-15 NOTE — Telephone Encounter (Signed)
Pt calls today to reschedule his cardioversion due to a scheduling conflict. There were no appointments available for 4/7 or 4/8 for the time pt specified. He stated anytime after those dates were fine.  Rescheduled pt DCCV for 12/30/14 at 1:30 pm with Dr. Marlou Porch Pt is aware & states he does not need a new letter sent to him  Horton Chin RN

## 2014-12-16 ENCOUNTER — Other Ambulatory Visit: Payer: Medicare Other

## 2014-12-16 ENCOUNTER — Other Ambulatory Visit: Payer: Self-pay | Admitting: *Deleted

## 2014-12-16 DIAGNOSIS — I25709 Atherosclerosis of coronary artery bypass graft(s), unspecified, with unspecified angina pectoris: Secondary | ICD-10-CM

## 2014-12-16 NOTE — Telephone Encounter (Signed)
-----   Message from Isaiah Serge, NP sent at 12/10/2014  4:58 PM EDT ----- Cholesterol improved, will need to recheck hepatic level in 2 weeks, one level elevated so will want to see if comes down.  Thanks.

## 2014-12-22 ENCOUNTER — Other Ambulatory Visit (INDEPENDENT_AMBULATORY_CARE_PROVIDER_SITE_OTHER): Payer: Medicare Other | Admitting: *Deleted

## 2014-12-22 DIAGNOSIS — I25709 Atherosclerosis of coronary artery bypass graft(s), unspecified, with unspecified angina pectoris: Secondary | ICD-10-CM

## 2014-12-22 LAB — HEPATIC FUNCTION PANEL
ALK PHOS: 88 U/L (ref 39–117)
ALT: 39 U/L (ref 0–53)
AST: 28 U/L (ref 0–37)
Albumin: 3.8 g/dL (ref 3.5–5.2)
Bilirubin, Direct: 0.2 mg/dL (ref 0.0–0.3)
Total Bilirubin: 0.8 mg/dL (ref 0.2–1.2)
Total Protein: 7.1 g/dL (ref 6.0–8.3)

## 2014-12-23 ENCOUNTER — Encounter: Payer: Self-pay | Admitting: Family Medicine

## 2014-12-23 ENCOUNTER — Ambulatory Visit (INDEPENDENT_AMBULATORY_CARE_PROVIDER_SITE_OTHER): Payer: Medicare Other | Admitting: Family Medicine

## 2014-12-23 VITALS — BP 134/82 | HR 69 | Temp 97.7°F | Ht 66.54 in | Wt 191.0 lb

## 2014-12-23 DIAGNOSIS — I4891 Unspecified atrial fibrillation: Secondary | ICD-10-CM | POA: Diagnosis not present

## 2014-12-23 DIAGNOSIS — L989 Disorder of the skin and subcutaneous tissue, unspecified: Secondary | ICD-10-CM

## 2014-12-23 DIAGNOSIS — E785 Hyperlipidemia, unspecified: Secondary | ICD-10-CM

## 2014-12-23 DIAGNOSIS — I10 Essential (primary) hypertension: Secondary | ICD-10-CM

## 2014-12-23 DIAGNOSIS — I255 Ischemic cardiomyopathy: Secondary | ICD-10-CM | POA: Diagnosis not present

## 2014-12-23 DIAGNOSIS — Z23 Encounter for immunization: Secondary | ICD-10-CM

## 2014-12-23 NOTE — Progress Notes (Signed)
Subjective:    Patient ID: Ricardo Valencia, male    DOB: [birth year], 81 y.o.   MRN: [id number]  HPI Patient here to reestablish care. Basically, he had been lost to primary care follow-up. We have not seen him in over 3 years. He was admitted on 11/07/2014 with shortness of breath and found to be in new onset atrial fibrillation with rapid ventricular spots. He had history of hypertension but has not been taking any medications. Patient had left heart cath revealing severe one-vessel CAD and underwent angioplasty and drug-eluting stent placement to mid LAD. He's done well since then. He has scheduled DC cardioversion in about 2 weeks.  He had ejection fraction 25-30%. Diffuse hypokinesis. Orthopnea resolved. Patient's current medications reviewed He takes Plavix, atorvastatin, diltiazem, lisinopril, metoprolol, and Eliquis. No bleeding issues other than some mild bruising.  Compliant with all medications.  Follow-up lipids per cardiology 2 weeks ago much improved. Minimally elevated ALTs of 60 which on follow-up yesterday is back to normal.  Several skin lesions noted on exam today-face, neck , back.  They could not be specific how long present.  Wife states that he has been "picking" at left neck lesion for at least couple of years.  No itching or bleeding.  Past Medical History  Diagnosis Date  . Allergy     hay fever, allergies  . Hyperlipidemia   . Hypertension   . CAD (coronary artery disease)     DES to mid LAD 11/10/2014  . Atrial fibrillation 11/07/2014   Past Surgical History  Procedure Laterality Date  . Appendectomy  1947  . Left heart catheterization with coronary angiogram N/A 11/10/2014    Procedure: LEFT HEART CATHETERIZATION WITH CORONARY ANGIOGRAM;  Surgeon: Wellington Hampshire, MD;  Location: [facility];  Service: Cardiovascular;  Laterality: N/A;  . Percutaneous coronary stent intervention (pci-s)  11/10/2014    Procedure: PERCUTANEOUS CORONARY STENT INTERVENTION  (PCI-S);  Surgeon: Wellington Hampshire, MD;  Location: [facility] CATH LAB;  Service: Cardiovascular;;    reports that he has never smoked. He does not have any smokeless tobacco history on file. His alcohol and drug histories are not on file. family history includes Cancer in his father and mother. No Known Allergies    Review of Systems  Constitutional: Negative for fatigue and unexpected weight change.  HENT: Negative for trouble swallowing.   Eyes: Negative for visual disturbance.  Respiratory: Negative for cough, chest tightness and shortness of breath.   Cardiovascular: Negative for chest pain, palpitations and leg swelling.  Gastrointestinal: Negative for abdominal pain.  Endocrine: Negative for polydipsia and polyuria.  Genitourinary: Negative for dysuria.  Skin:       Several skin lesions- see PE  Neurological: Negative for dizziness, syncope, weakness, light-headedness and headaches.  Hematological: Bruises/bleeds easily.  Psychiatric/Behavioral: Negative for confusion.       Objective:   Physical Exam  Constitutional: He is oriented to person, place, and time. He appears well-developed and well-nourished.  HENT:  Head: Normocephalic and atraumatic.  Mouth/Throat: Oropharynx is clear and moist.  Neck: Neck supple. No JVD present.  Cardiovascular: Normal rate.   Irregular rhythm rate controlled  Pulmonary/Chest: Effort normal and breath sounds normal. No respiratory distress. He has no wheezes. He has no rales.  Abdominal: Soft. There is no tenderness.  Musculoskeletal: He exhibits no edema.  Neurological: He is alert and oriented to person, place, and time.  Skin:  He has several concerning skin lesions as follows:  Right upper  back - 7 mm darkly pigmented lesion with somewhat irregular bordered, some asymmetry. He has some color variegation. Left face inferior orbital region laterally-nodular lesion with some mild ulceration. Very concerning for probable basal cell  carcinoma Large greater than one and 1/2 cm nodular lesion left neck Left forehead crusted ulcerative lesion  Psychiatric: He has a normal mood and affect. His behavior is normal.          Assessment & Plan:  #1 recent new onset atrial fibrillation in the setting of coronary artery disease. Patient currently rate controlled and treated with Eliquis with pending cardioversion #2 CAD with recent angioplasty and stent as above. Symptomatically stable #3 hyperlipidemia improved on atorvastatin #4 health maintenance. Patient does not have a documented history of Pneumovax or Prevnar. Prevnar 13 given. Consider Pneumovax in 1 year #5 several concerning skin lesions as above. Particularly concerned about right upper back lesion which could represent melanoma. He has very likely large basal cell carcinoma left inferior orbit region and basal cell versus squamous cell left forehead and possible squamous cell carcinoma left neck. Set up referral to skin surgery Center

## 2014-12-23 NOTE — Progress Notes (Signed)
Pre visit review using our clinic review tool, if applicable. No additional management support is needed unless otherwise documented below in the visit note. 

## 2014-12-29 ENCOUNTER — Telehealth: Payer: Self-pay | Admitting: Cardiovascular Disease

## 2014-12-29 NOTE — Telephone Encounter (Signed)
Left message to call back  

## 2014-12-29 NOTE — Telephone Encounter (Signed)
Wife calling to verify time and place and instructions for CV tomorrow.  Advised he is scheduled for 1:30 so will need to go to main entrance  [facility] by 12:30.  Advised to take all morning medications including Eliquis with a sip of water but otherwise should not eat or drink anything after midnight. Advised that she can drop him off at entrance then go park car.  She verbalizes understanding and will follow instructions.

## 2014-12-29 NOTE — Telephone Encounter (Signed)
New Message  Pt wife called.req a call back to discuss surgery on 12/30/2014. What time to arrive and if there are any special instructions

## 2014-12-30 ENCOUNTER — Encounter (HOSPITAL_COMMUNITY)
Admission: RE | Disposition: A | Discharge status: Home / Self Care | Payer: Self-pay | Source: Ambulatory Visit | Attending: Cardiovascular Disease

## 2014-12-30 ENCOUNTER — Ambulatory Visit (HOSPITAL_COMMUNITY): Payer: Medicare Other | Admitting: Certified Registered Nurse Anesthetist

## 2014-12-30 ENCOUNTER — Encounter (HOSPITAL_COMMUNITY): Payer: Self-pay | Admitting: *Deleted

## 2014-12-30 ENCOUNTER — Ambulatory Visit (HOSPITAL_COMMUNITY)
Admission: RE | Admit: 2014-12-30 | Discharge: 2014-12-30 | Disposition: A | Discharge status: Home / Self Care | Payer: Medicare Other | Source: Ambulatory Visit | Attending: Cardiovascular Disease | Admitting: Cardiovascular Disease

## 2014-12-30 DIAGNOSIS — I4891 Unspecified atrial fibrillation: Secondary | ICD-10-CM | POA: Diagnosis not present

## 2014-12-30 DIAGNOSIS — I255 Ischemic cardiomyopathy: Secondary | ICD-10-CM | POA: Insufficient documentation

## 2014-12-30 DIAGNOSIS — Z7902 Long term (current) use of antithrombotics/antiplatelets: Secondary | ICD-10-CM | POA: Diagnosis not present

## 2014-12-30 DIAGNOSIS — Z7901 Long term (current) use of anticoagulants: Secondary | ICD-10-CM | POA: Diagnosis not present

## 2014-12-30 DIAGNOSIS — I2510 Atherosclerotic heart disease of native coronary artery without angina pectoris: Secondary | ICD-10-CM | POA: Diagnosis not present

## 2014-12-30 DIAGNOSIS — Z79899 Other long term (current) drug therapy: Secondary | ICD-10-CM | POA: Insufficient documentation

## 2014-12-30 DIAGNOSIS — I10 Essential (primary) hypertension: Secondary | ICD-10-CM | POA: Diagnosis not present

## 2014-12-30 DIAGNOSIS — Z955 Presence of coronary angioplasty implant and graft: Secondary | ICD-10-CM | POA: Diagnosis not present

## 2014-12-30 DIAGNOSIS — E785 Hyperlipidemia, unspecified: Secondary | ICD-10-CM | POA: Diagnosis not present

## 2014-12-30 DIAGNOSIS — I5021 Acute systolic (congestive) heart failure: Secondary | ICD-10-CM | POA: Insufficient documentation

## 2014-12-30 HISTORY — PX: CARDIOVERSION: SHX1299

## 2014-12-30 SURGERY — CARDIOVERSION
Anesthesia: Monitor Anesthesia Care

## 2014-12-30 MED ORDER — PROPOFOL 10 MG/ML IV BOLUS
INTRAVENOUS | Status: DC | PRN
  Administered 2014-12-30: 80 mg via INTRAVENOUS

## 2014-12-30 MED ORDER — SODIUM CHLORIDE 0.9 % IV SOLN
INTRAVENOUS | Status: DC
  Administered 2014-12-30: 13:00:00 via INTRAVENOUS

## 2014-12-30 MED ORDER — LIDOCAINE HCL (CARDIAC) 20 MG/ML IV SOLN
INTRAVENOUS | Status: DC | PRN
  Administered 2014-12-30: 40 mg via INTRAVENOUS

## 2014-12-30 NOTE — Transfer of Care (Signed)
Immediate Anesthesia Transfer of Care Note  Patient: Ricardo Valencia  Procedure(s) Performed: Procedure(s): CARDIOVERSION (N/A)  Patient Location: Endoscopy Unit  Anesthesia Type:MAC  Level of Consciousness: awake, alert  and oriented  Airway & Oxygen Therapy: Patient Spontanous Breathing and Patient connected to nasal cannula oxygen  Post-op Assessment: Report given to RN and Post -op Vital signs reviewed and stable  Post vital signs: Reviewed and stable  Last Vitals:  Filed Vitals:   12/30/14 1230  BP: 188/80  Pulse: 75  Resp: 23    Complications: No apparent anesthesia complications

## 2014-12-30 NOTE — Anesthesia Postprocedure Evaluation (Signed)
  Anesthesia Post-op Note  Patient: Ricardo Valencia  Procedure(s) Performed: Procedure(s): CARDIOVERSION (N/A)  Patient Location: Endoscopy Unit  Anesthesia Type:MAC  Level of Consciousness: awake, alert  and oriented  Airway and Oxygen Therapy: Patient Spontanous Breathing  Post-op Pain: none  Post-op Assessment: Post-op Vital signs reviewed, Patient's Cardiovascular Status Stable, Respiratory Function Stable, Patent Airway, No signs of Nausea or vomiting and Pain level controlled  Post-op Vital Signs: Reviewed and stable  Last Vitals:  Filed Vitals:   12/30/14 1327  BP:   Pulse:   Temp: 36.6 C  Resp:     Complications: No apparent anesthesia complications

## 2014-12-30 NOTE — Anesthesia Postprocedure Evaluation (Signed)
  Anesthesia Post-op Note  Patient: Ricardo Valencia  Procedure(s) Performed: Procedure(s): CARDIOVERSION (N/A)  Patient Location: Endoscopy Unit  Anesthesia Type:MAC  Level of Consciousness: awake, alert  and oriented  Airway and Oxygen Therapy: Patient Spontanous Breathing  Post-op Pain: none  Post-op Assessment: Post-op Vital signs reviewed, Patient's Cardiovascular Status Stable, Respiratory Function Stable, Patent Airway and No signs of Nausea or vomiting  Post-op Vital Signs: Reviewed and stable  Last Vitals:  Filed Vitals:   12/30/14 1230  BP: 188/80  Pulse: 75  Resp: 23    Complications: No apparent anesthesia complications

## 2014-12-30 NOTE — Interval H&P Note (Signed)
History and Physical Interval Note:  12/30/2014 1:02 PM  Ricardo Valencia  has presented today for surgery, with the diagnosis of AFIB  The various methods of treatment have been discussed with the patient and family. After consideration of risks, benefits and other options for treatment, the patient has consented to  Procedure(s): CARDIOVERSION (N/A) as a surgical intervention .  The patient's history has been reviewed, patient examined, no change in status, stable for surgery.  I have reviewed the patient's chart and labs.  Questions were answered to the patient's satisfaction.     SKAINS, MARK

## 2014-12-30 NOTE — CV Procedure (Signed)
    Electrical Cardioversion Procedure Note Ricardo Valencia [id number] Sep 22, 1928  Procedure: Electrical Cardioversion Indications:  Atrial Fibrillation  Time Out: Verified patient identification, verified procedure,medications/allergies/relevent history reviewed, required imaging and test results available.  Performed  Procedure Details  The patient was NPO after midnight. Anesthesia was administered at the beside  by Dr. Ermalene Postin with propofol.  Cardioversion was performed with synchronized biphasic defibrillation via AP pads with 120, 150 joules.  2 attempt(s) were performed.  The patient converted to normal sinus rhythm. The patient tolerated the procedure well   IMPRESSION:  Successful cardioversion of atrial fibrillation after 2 attempts.   Eros Montour, [place] 12/30/2014, 1:20 PM

## 2014-12-30 NOTE — Anesthesia Preprocedure Evaluation (Addendum)
Anesthesia Evaluation  Patient identified by MRN, date of birth, ID band  Reviewed: Allergy & Precautions, NPO status , Patient's Chart, lab work & pertinent test results  History of Anesthesia Complications Negative for: history of anesthetic complications  Airway Mallampati: II       Dental  (+) Lower Dentures, Upper Dentures   Pulmonary neg pulmonary ROS,  breath sounds clear to auscultation        Cardiovascular hypertension, Pt. on medications and Pt. on home beta blockers + CAD + dysrhythmias Atrial Fibrillation Rhythm:Irregular     Neuro/Psych negative neurological ROS  negative psych ROS   GI/Hepatic negative GI ROS, Neg liver ROS,   Endo/Other  negative endocrine ROS  Renal/GU negative Renal ROS     Musculoskeletal   Abdominal   Peds  Hematology negative hematology ROS (+)   Anesthesia Other Findings   Reproductive/Obstetrics                            Anesthesia Physical Anesthesia Plan  ASA: III  Anesthesia Plan: MAC and General   Post-op Pain Management:    Induction: Intravenous  Airway Management Planned: Mask  Additional Equipment:   Intra-op Plan:   Post-operative Plan:   Informed Consent: I have reviewed the patients History and Physical, chart, labs and discussed the procedure including the risks, benefits and alternatives for the proposed anesthesia with the patient or authorized representative who has indicated his/her understanding and acceptance.   Dental advisory given  Plan Discussed with: CRNA, Anesthesiologist and Surgeon  Anesthesia Plan Comments:         Anesthesia Quick Evaluation

## 2014-12-30 NOTE — H&P (View-Only) (Signed)
Cardiology Office Note   Date:  12/10/2014   ID:  LAWERNCE Valencia, DOB August 10, 1929, MRN [id number]  PCP:  No PCP Per Patient  Cardiologist:  Dr. Angelena Form     Chief Complaint  Patient presents with  . Atrial Fibrillation    asked to be seen to discuss, a fib, CHF, CAD.      History of Present Illness: Ricardo Valencia is a 81 y.o. male who presents for evaluation and discussion on his a fib, CHF with decreased EF and CAD.  Recent admit for a fib with RVR, and CHF with EF 25-30%, treated with diuresis, control of a fib and cardiac cath with stent to mLAD.  He has not needed lasix since discharge.  No chest pain and no SOB.  He was feeling weak after his plavix and eliquis.  He is concerned about his meds and the cost of eliquis is $500.00 per month.   We discussed BP and meds in detail.  He will see pharmacy here to discuss options, OK to go to coumadin if need to.  He is fatigued which may be more to his a fib which we discussed.  No chest pain and no SOB.    Past Medical History  Diagnosis Date  . Allergy     hay fever, allergies  . Hyperlipidemia   . Hypertension   . CAD (coronary artery disease)     DES to mid LAD 11/10/2014  . Atrial fibrillation 11/07/2014    Past Surgical History  Procedure Laterality Date  . Appendectomy  1947  . Left heart catheterization with coronary angiogram N/A 11/10/2014    Procedure: LEFT HEART CATHETERIZATION WITH CORONARY ANGIOGRAM;  Surgeon: Wellington Hampshire, MD;  Location: [facility];  Service: Cardiovascular;  Laterality: N/A;  . Percutaneous coronary stent intervention (pci-s)  11/10/2014    Procedure: PERCUTANEOUS CORONARY STENT INTERVENTION (PCI-S);  Surgeon: Wellington Hampshire, MD;  Location: [facility] CATH LAB;  Service: Cardiovascular;;     Current Outpatient Prescriptions  Medication Sig Dispense Refill  . apixaban (ELIQUIS) 5 MG TABS tablet Take 1 tablet (5 mg total) by mouth 2 (two) times daily. 60 tablet 0  . Ascorbic Acid  (VITAMIN C) 100 MG tablet Take 100 mg by mouth daily.      Marland Kitchen atorvastatin (LIPITOR) 40 MG tablet Take 1 tablet (40 mg total) by mouth daily at 6 PM. 30 tablet 11  . clopidogrel (PLAVIX) 75 MG tablet Take 1 tablet (75 mg total) by mouth daily with breakfast. 30 tablet 11  . diltiazem (CARDIZEM CD) 240 MG 24 hr capsule Take 1 capsule (240 mg total) by mouth daily. 30 capsule 11  . furosemide (LASIX) 20 MG tablet Take 1 tablet (20 mg total) by mouth daily as needed. 30 tablet 11  . lisinopril (PRINIVIL,ZESTRIL) 5 MG tablet Take 1 tablet (5 mg total) by mouth daily. 30 tablet 11  . metoprolol succinate (TOPROL-XL) 50 MG 24 hr tablet Take 1 tablet (50 mg total) by mouth daily. Take with or immediately following a meal. 30 tablet 11   No current facility-administered medications for this visit.    Allergies:   Review of patient's allergies indicates no known allergies.    Social History:  The patient  reports that he has never smoked. He does not have any smokeless tobacco history on file.   Family History:  The patient's family history includes Cancer in his father and mother.    ROS:  General:no colds or  fevers, + weight loss Skin:no rashes or ulcers HEENT:no blurred vision, no congestion CV:see HPI PUL:see HPI GI:no diarrhea constipation or melena, no indigestion GU:no hematuria, no dysuria MS:no joint pain, no claudication Neuro:no syncope, no lightheadedness Endo:no diabetes, no thyroid disease    Wt Readings from Last 3 Encounters:  12/10/14 188 lb 12.8 oz (85.639 kg)  11/25/14 194 lb (87.998 kg)  11/12/14 196 lb 6.4 oz (89.086 kg)     PHYSICAL EXAM: VS:  BP 132/70 mmHg  Pulse 100  Ht 5\' 9"  (1.753 m)  Wt 188 lb 12.8 oz (85.639 kg)  BMI 27.87 kg/m2 , BMI Body mass index is 27.87 kg/(m^2). General:Pleasant affect, NAD Skin:Warm and dry, brisk capillary refill HEENT:normocephalic, sclera clear, mucus membranes moist Neck:supple, no JVD, no bruits  Heart:S1S2 RRR without  murmur, gallup, rub or click Lungs:clear without rales, rhonchi, or wheezes XBD:ZHGD, non tender, + BS, do not palpate liver spleen or masses Ext:no lower ext edema, 2+ pedal pulses, 2+ radial pulses Neuro:alert and oriented X 3, MAE, follows commands, + facial symmetry    EKG:  EKG is not ordered today.    Recent Labs: 11/07/2014: B Natriuretic Peptide 464.5*; Magnesium 2.0; TSH 3.733 11/12/2014: Hemoglobin 14.6; Platelets 170 11/25/2014: BUN 17; Creatinine 1.39; Potassium 4.4; Sodium 136 12/10/2014: ALT 60*    Lipid Panel    Component Value Date/Time   CHOL 124 12/10/2014 1043   TRIG 74 12/10/2014 1043   HDL 53 12/10/2014 1043   CHOLHDL 2.3 12/10/2014 1043   VLDL 15 12/10/2014 1043   LDLCALC 56 12/10/2014 1043   LDLDIRECT 191.1 01/04/2011 1218       Other studies Reviewed: Additional studies/ records that were reviewed today include: cardiac cath, labs, hospital note and OV.   ASSESSMENT AND PLAN:   Atrial fibrillation with rapid ventricular response, now rate controlled, no EKG today but irreg irreg.  Last progress notes stated DCCV in 3-4 weeks post op.  Pt has not missed eliquis and we discussed importance of not missing especially with plan for DCCV.   I believe he would have improved energy back in SR.   Anticoagulation- eliquis, CHA2DS2VASc score 5. Continue   Hypertension- they will call if systolic BP > 924 Consistently   Hyperlipidemia- treated on lipitor 40 mg   CAD (coronary artery disease)- new stent to LAD- stable and no chest pain.    Acute systolic HF- resolved euvolemic today   Combined Ischemic/nonischemic cardiomyopathy -EF 25-30% with mild to moderate MR  At cath 30-35%.  Will need repeat Echo in 3 months but  EF should improve with SR.  Will send Dr. Angelena Form note asking if we can plan DCCV in next 1-2 weeks.  He has been anticoagulated since 11/12/14 .  Pt and family had good understanding of meds at end of visit. Also discussed no ETOH  for now at least, ice cream is ok but small amounts.    Current medicines are reviewed with the patient today.  The patient Has no concerns regarding medicines.  The following changes have been made:  See above Labs/ tests ordered today include:see above  Disposition:   FU:  see above  Lennie Muckle, NP  12/10/2014 3:08 PM    [facility], [address], [place], [place] Phone: [phone]; Fax: [phone]

## 2014-12-30 NOTE — Discharge Instructions (Signed)

## 2014-12-31 ENCOUNTER — Encounter (HOSPITAL_COMMUNITY): Payer: Self-pay | Admitting: Cardiology

## 2015-01-04 DIAGNOSIS — C44319 Basal cell carcinoma of skin of other parts of face: Secondary | ICD-10-CM | POA: Diagnosis not present

## 2015-01-04 DIAGNOSIS — L570 Actinic keratosis: Secondary | ICD-10-CM | POA: Diagnosis not present

## 2015-01-04 DIAGNOSIS — D485 Neoplasm of uncertain behavior of skin: Secondary | ICD-10-CM | POA: Diagnosis not present

## 2015-01-04 DIAGNOSIS — L821 Other seborrheic keratosis: Secondary | ICD-10-CM | POA: Diagnosis not present

## 2015-01-10 NOTE — Telephone Encounter (Signed)
-----   Message from Isaiah Serge, NP sent at 12/22/2014  6:05 PM EDT ----- Labs back to normal on liver panel.

## 2015-01-13 ENCOUNTER — Ambulatory Visit (HOSPITAL_COMMUNITY): Payer: Medicare Other | Attending: Internal Medicine

## 2015-01-13 DIAGNOSIS — I255 Ischemic cardiomyopathy: Secondary | ICD-10-CM | POA: Insufficient documentation

## 2015-01-13 NOTE — Progress Notes (Signed)
2D Echo completed. 01/13/2015 

## 2015-01-14 ENCOUNTER — Ambulatory Visit (INDEPENDENT_AMBULATORY_CARE_PROVIDER_SITE_OTHER): Payer: Medicare Other | Admitting: Cardiovascular Disease

## 2015-01-14 ENCOUNTER — Encounter: Payer: Self-pay | Admitting: Cardiovascular Disease

## 2015-01-14 VITALS — BP 140/80 | HR 59 | Ht 69.0 in | Wt 188.4 lb

## 2015-01-14 DIAGNOSIS — I2510 Atherosclerotic heart disease of native coronary artery without angina pectoris: Secondary | ICD-10-CM | POA: Diagnosis not present

## 2015-01-14 DIAGNOSIS — I10 Essential (primary) hypertension: Secondary | ICD-10-CM

## 2015-01-14 DIAGNOSIS — I255 Ischemic cardiomyopathy: Secondary | ICD-10-CM | POA: Diagnosis not present

## 2015-01-14 DIAGNOSIS — E785 Hyperlipidemia, unspecified: Secondary | ICD-10-CM | POA: Diagnosis not present

## 2015-01-14 DIAGNOSIS — I4891 Unspecified atrial fibrillation: Secondary | ICD-10-CM | POA: Diagnosis not present

## 2015-01-14 MED ORDER — WARFARIN SODIUM 5 MG PO TABS: ORAL_TABLET | ORAL | Status: DC

## 2015-01-14 NOTE — Progress Notes (Signed)
Chief Complaint  Patient presents with  . Coronary Artery Disease     History of Present Illness: 81 yo male with history of CAD, HTN, HLD, PAF who is here today for cardiac follow up. He was admitted to [facility] 11/07/14 with atrial fib with RVR. LV systolic function was decreased. LVEF noted to be 25-30% with moderate MR by echo 11/07/14. Cardiac cath 11/10/14 with severe LAD stenosis. This was treated with a 2.75 x 18 mm Xience DES x 1. Moderate disease in the RCA. He was brought back for DCCV 12/30/14 and converted to sinus. Repeat echo 01/13/15 with LVEF=45-50%. No MR noted.   He is here today for follow up. He feels great. No chest pain, SOB, palpitations, dizziness, near syncope. Tolerating Eliquis but wants to change to coumadin due to cost.   Primary Care Physician: Burchette  Last Lipid Profile:Lipid Panel     Component Value Date/Time   CHOL 124 12/10/2014 1043   TRIG 74 12/10/2014 1043   HDL 53 12/10/2014 1043   CHOLHDL 2.3 12/10/2014 1043   VLDL 15 12/10/2014 1043   LDLCALC 56 12/10/2014 1043     Past Medical History  Diagnosis Date  . Allergy     hay fever, allergies  . Hyperlipidemia   . Hypertension   . CAD (coronary artery disease)     DES to mid LAD 11/10/2014  . Atrial fibrillation 11/07/2014    Past Surgical History  Procedure Laterality Date  . Appendectomy  1947  . Left heart catheterization with coronary angiogram N/A 11/10/2014    Procedure: LEFT HEART CATHETERIZATION WITH CORONARY ANGIOGRAM;  Surgeon: Wellington Hampshire, MD;  Location: [facility] CATH LAB;  Service: Cardiovascular;  Laterality: N/A;  . Percutaneous coronary stent intervention (pci-s)  11/10/2014    Procedure: PERCUTANEOUS CORONARY STENT INTERVENTION (PCI-S);  Surgeon: Wellington Hampshire, MD;  Location: [facility] CATH LAB;  Service: Cardiovascular;;  . Cardioversion N/A 12/30/2014    Procedure: CARDIOVERSION;  Surgeon: Jerline Pain, MD;  Location: [facility] ENDOSCOPY;  Service: Cardiovascular;  Laterality: N/A;     Current Outpatient Prescriptions  Medication Sig Dispense Refill  . apixaban (ELIQUIS) 5 MG TABS tablet Take 1 tablet (5 mg total) by mouth 2 (two) times daily. 60 tablet 0  . Ascorbic Acid (VITAMIN C) 100 MG tablet Take 100 mg by mouth daily.      Marland Kitchen atorvastatin (LIPITOR) 40 MG tablet Take 1 tablet (40 mg total) by mouth daily at 6 PM. 30 tablet 11  . clopidogrel (PLAVIX) 75 MG tablet Take 1 tablet (75 mg total) by mouth daily with breakfast. 30 tablet 11  . diltiazem (CARDIZEM CD) 240 MG 24 hr capsule Take 1 capsule (240 mg total) by mouth daily. 30 capsule 11  . lisinopril (PRINIVIL,ZESTRIL) 5 MG tablet Take 1 tablet (5 mg total) by mouth daily. 30 tablet 11  . metoprolol succinate (TOPROL-XL) 50 MG 24 hr tablet Take 1 tablet (50 mg total) by mouth daily. Take with or immediately following a meal. 30 tablet 11   No current facility-administered medications for this visit.    No Known Allergies  History   Social History  . Marital Status: Married    Spouse Name: N/A  . Number of Children: N/A  . Years of Education: N/A   Occupational History  . Not on file.   Social History Main Topics  . Smoking status: Never Smoker   . Smokeless tobacco: Not on file  . Alcohol Use: No  .  Drug Use: No  . Sexual Activity: Not on file   Other Topics Concern  . Not on file   Social History Narrative    Family History  Problem Relation Age of Onset  . Cancer Mother     breast  . Cancer Father     colon    Review of Systems:  As stated in the HPI and otherwise negative.   BP 140/80 mmHg  Pulse 59  Ht 5\' 9"  (1.753 m)  Wt 188 lb 6.4 oz (85.458 kg)  BMI 27.81 kg/m2  Physical Examination: General: Well developed, well nourished, NAD HEENT: OP clear, mucus membranes moist SKIN: warm, dry. No rashes. Neuro: No focal deficits Musculoskeletal: Muscle strength 5/5 all ext Psychiatric: Mood and affect normal Neck: No JVD, no carotid bruits, no thyromegaly, no  lymphadenopathy. Lungs:Clear bilaterally, no wheezes, rhonci, crackles Cardiovascular: Regular rate and rhythm. No murmurs, gallops or rubs. Abdomen:Soft. Bowel sounds present. Non-tender.  Extremities: No lower extremity edema. Pulses are 2 + in the bilateral DP/PT.  Echo 01/13/15: Left ventricle: The cavity size was normal. Wall thickness was increased in a pattern of mild LVH. Systolic function was mildly reduced. The estimated ejection fraction was in the range of 45% to 50%. Images were inadequate for LV wall motion assessment. The study is not technically sufficient to allow evaluation of LV diastolic function. - Left atrium: Moderately dilated at 45 ml/m2. - Right atrium: Severely dilated at 30 cm2. Impressions: - LVEF 45-50%, global hypokinesis with regional variation, Moderate LAE and severe RAE. Compared to the prior study in 10/2014, LV function has improved from 25-30% up to current value.  Cardiac cath 11/10/14: Coronary dominance: Right   Left Main: Normal  Left Anterior Descending (LAD): Normal in size with minor irregularities in the proximal segment. There is a 90% discrete stenosis in the midsegment just before the origin of the second diagonal. The rest of the vessel has minor irregularities and wraps around the apex.  1st diagonal (D1): Normal in size with 40% proximal stenosis.  2nd diagonal (D2): Normal in size with 20% ostial stenosis.  3rd diagonal (D3): Small in size with minor irregularities.  Circumflex (LCx): Normal in size with minor irregularities.  1st obtuse marginal: Small in size with minor irregularities.  2nd obtuse marginal: Medium in size with minor irregularities.  3rd obtuse marginal: Medium in size with minor irregularities.   Right Coronary Artery: Large in size and dominant. The vessel has minor irregularities. There is 50% stenosis distally before the bifurcation  Posterior descending artery:  Large in size with no significant disease.  Left ventriculography: Left ventricular systolic function is moderately to severely reduced , LVEF is estimated at 30-35 %, there is no significant mitral regurgitation   EKG:  EKG is ordered today. The ekg ordered today demonstrates Sinus brady, rate 59 bpm. T wave inversions inferior and anterolateral leads.   Recent Labs: 11/07/2014: B Natriuretic Peptide 464.5*; Magnesium 2.0; TSH 3.733 12/14/2014: BUN 12; Creatinine 1.26; Hemoglobin 14.6; Platelets 169.0; Potassium 3.9; Sodium 137 12/22/2014: ALT 39   Lipid Panel    Component Value Date/Time   CHOL 124 12/10/2014 1043   TRIG 74 12/10/2014 1043   HDL 53 12/10/2014 1043   CHOLHDL 2.3 12/10/2014 1043   VLDL 15 12/10/2014 1043   LDLCALC 56 12/10/2014 1043   LDLDIRECT 191.1 01/04/2011 1218     Wt Readings from Last 3 Encounters:  01/14/15 188 lb 6.4 oz (85.458 kg)  12/23/14 191 lb (86.637 kg)  12/10/14 188 lb 12.8 oz (85.639 kg)     Other studies Reviewed: Additional studies/ records that were reviewed today include:. Review of the above records demonstrates:    Assessment and Plan:   1. CAD: Stable. Will continue Plavix at least until February 2017 with DES placement. Continue statin and beta blocker.   2. HTN: BP controlled. No changes  3. Hyperlipidemia: Continue statin.   4. Atrial fibrillation, paroxysmal: He is in sinus today. Will continue Cardizem and Toprol. He wishes to change from Eliquis to coumadin. Will allow 4 weeks post DCCV and then change to coumadin. Appreciate assistance of coumadin clinic.  5. Ischemic cardiomyopathy: LVEF is near normal. Continue medical therapy.

## 2015-01-14 NOTE — Patient Instructions (Signed)
Medication Instructions:  Your physician recommends that you continue on your current medications as directed. Please refer to the Current Medication list given to you today. We will schedule appointment with coumadin clinic in our office to make change from Eliquis to Coumadin.    Labwork: none  Testing/Procedures: none  Follow-Up: Your physician recommends that you schedule a follow-up appointment in:  3-4 months with Dr. Azucena Freed for May 09, 2015 at 8:45

## 2015-01-24 ENCOUNTER — Telehealth: Payer: Self-pay | Admitting: Cardiovascular Disease

## 2015-02-10 NOTE — Telephone Encounter (Signed)
Error

## 2015-02-15 ENCOUNTER — Ambulatory Visit (INDEPENDENT_AMBULATORY_CARE_PROVIDER_SITE_OTHER): Payer: Medicare Other | Admitting: *Deleted

## 2015-02-15 DIAGNOSIS — Z5181 Encounter for therapeutic drug level monitoring: Secondary | ICD-10-CM | POA: Insufficient documentation

## 2015-02-15 DIAGNOSIS — I4891 Unspecified atrial fibrillation: Secondary | ICD-10-CM

## 2015-02-15 LAB — POCT INR: INR: 2.2

## 2015-02-15 MED ORDER — DILTIAZEM HCL ER BEADS 240 MG PO CP24: 240.0000 mg | ORAL_CAPSULE | Freq: Every day | ORAL | Status: DC

## 2015-02-15 NOTE — Patient Instructions (Signed)

## 2015-02-22 ENCOUNTER — Ambulatory Visit (INDEPENDENT_AMBULATORY_CARE_PROVIDER_SITE_OTHER): Payer: Medicare Other | Admitting: *Deleted

## 2015-02-22 DIAGNOSIS — Z5181 Encounter for therapeutic drug level monitoring: Secondary | ICD-10-CM

## 2015-02-22 DIAGNOSIS — I4891 Unspecified atrial fibrillation: Secondary | ICD-10-CM

## 2015-02-22 LAB — POCT INR: INR: 1.8

## 2015-03-01 ENCOUNTER — Ambulatory Visit (INDEPENDENT_AMBULATORY_CARE_PROVIDER_SITE_OTHER): Payer: Medicare Other

## 2015-03-01 DIAGNOSIS — Z5181 Encounter for therapeutic drug level monitoring: Secondary | ICD-10-CM

## 2015-03-01 DIAGNOSIS — I4891 Unspecified atrial fibrillation: Secondary | ICD-10-CM | POA: Diagnosis not present

## 2015-03-01 LAB — POCT INR: INR: 1.7

## 2015-03-08 ENCOUNTER — Ambulatory Visit (INDEPENDENT_AMBULATORY_CARE_PROVIDER_SITE_OTHER): Payer: Medicare Other

## 2015-03-08 DIAGNOSIS — I4891 Unspecified atrial fibrillation: Secondary | ICD-10-CM

## 2015-03-08 DIAGNOSIS — Z5181 Encounter for therapeutic drug level monitoring: Secondary | ICD-10-CM | POA: Diagnosis not present

## 2015-03-08 LAB — POCT INR: INR: 2

## 2015-03-15 ENCOUNTER — Ambulatory Visit (INDEPENDENT_AMBULATORY_CARE_PROVIDER_SITE_OTHER): Payer: Medicare Other | Admitting: *Deleted

## 2015-03-15 DIAGNOSIS — Z5181 Encounter for therapeutic drug level monitoring: Secondary | ICD-10-CM | POA: Diagnosis not present

## 2015-03-15 DIAGNOSIS — I4891 Unspecified atrial fibrillation: Secondary | ICD-10-CM | POA: Diagnosis not present

## 2015-03-15 LAB — POCT INR: INR: 2.7

## 2015-03-24 ENCOUNTER — Ambulatory Visit (INDEPENDENT_AMBULATORY_CARE_PROVIDER_SITE_OTHER): Payer: Medicare Other | Admitting: Family Medicine

## 2015-03-24 ENCOUNTER — Encounter: Payer: Self-pay | Admitting: Family Medicine

## 2015-03-24 VITALS — BP 132/80 | HR 88 | Temp 98.4°F | Resp 12 | Wt 185.0 lb

## 2015-03-24 DIAGNOSIS — L989 Disorder of the skin and subcutaneous tissue, unspecified: Secondary | ICD-10-CM | POA: Diagnosis not present

## 2015-03-24 DIAGNOSIS — I255 Ischemic cardiomyopathy: Secondary | ICD-10-CM

## 2015-03-24 DIAGNOSIS — I10 Essential (primary) hypertension: Secondary | ICD-10-CM

## 2015-03-24 DIAGNOSIS — I4891 Unspecified atrial fibrillation: Secondary | ICD-10-CM

## 2015-03-24 NOTE — Progress Notes (Signed)
   Subjective:    Patient ID: Ricardo Valencia, male    DOB: [birth year], 81 y.o.   MRN: [id number]  HPI Patient recently seen here to establish care. He has history of CAD, atrial fibrillation, hypertension, hyperlipidemia. He was recently changed from Eliquis to Coumadin for cost issues. He had DCCV little over month ago. He has been asymptomatic. No chest pains. No dizziness. No dyspnea. No palpitations.  Appears to be taking all of his medications as prescribed.    We noted several concerning skin lesions. He went to skin surgery Center had multiple biopsies and we have no report of findings but he was scheduled comeback but has not seen them yet.  He is getting his pro times followed through the Coumadin clinic with cardiology.  Past Medical History  Diagnosis Date  . Allergy     hay fever, allergies  . Hyperlipidemia   . Hypertension   . CAD (coronary artery disease)     DES to mid LAD 11/10/2014  . Atrial fibrillation 11/07/2014   Past Surgical History  Procedure Laterality Date  . Appendectomy  1947  . Left heart catheterization with coronary angiogram N/A 11/10/2014    Procedure: LEFT HEART CATHETERIZATION WITH CORONARY ANGIOGRAM;  Surgeon: Wellington Hampshire, MD;  Location: [facility] CATH LAB;  Service: Cardiovascular;  Laterality: N/A;  . Percutaneous coronary stent intervention (pci-s)  11/10/2014    Procedure: PERCUTANEOUS CORONARY STENT INTERVENTION (PCI-S);  Surgeon: Wellington Hampshire, MD;  Location: [facility] CATH LAB;  Service: Cardiovascular;;  . Cardioversion N/A 12/30/2014    Procedure: CARDIOVERSION;  Surgeon: Jerline Pain, MD;  Location: [facility] ENDOSCOPY;  Service: Cardiovascular;  Laterality: N/A;    reports that he has never smoked. He does not have any smokeless tobacco history on file. He reports that he does not drink alcohol or use illicit drugs. family history includes Cancer in his father and mother. No Known Allergies    Review of Systems  Constitutional: Negative for  fatigue.  Eyes: Negative for visual disturbance.  Respiratory: Negative for cough, chest tightness and shortness of breath.   Cardiovascular: Negative for chest pain, palpitations and leg swelling.  Neurological: Negative for dizziness, syncope, weakness, light-headedness and headaches.  Hematological: Negative for adenopathy.  Psychiatric/Behavioral: Negative for confusion.       Objective:   Physical Exam  Constitutional: He appears well-developed and well-nourished.  Neck: Neck supple. No JVD present.  Cardiovascular: Normal rate.   Irregularly irregular rhythm but rate controlled  Pulmonary/Chest: Effort normal and breath sounds normal. No respiratory distress. He has no wheezes. He has no rales.  Musculoskeletal: He exhibits no edema.  Skin:  Nodular skin lesions (as previously noted) left face and left neck.          Assessment & Plan:  #1 history of atrial fibrillation. Recent DC cardioversion. He appears to be back in atrial fibrillation today clinically-but rate controlled. Continue Coumadin and follow-up with Coumadin clinic and cardiologist as scheduled  #2 hypertension stable and at goal #3 multiple concerning skin lesions biopsied per dermatology. He is encouraged to keep close follow-up with them.  He had questions regarding whether he could have dermatologic procedures on coumadin and we explained as long as he is "therapeutic" this should not pose any major problem with minor skin surgery such as biopsies.

## 2015-03-24 NOTE — Progress Notes (Signed)
Pre visit review using our clinic review tool, if applicable. No additional management support is needed unless otherwise documented below in the visit note. 

## 2015-03-25 ENCOUNTER — Ambulatory Visit (INDEPENDENT_AMBULATORY_CARE_PROVIDER_SITE_OTHER): Payer: Medicare Other | Admitting: *Deleted

## 2015-03-25 DIAGNOSIS — Z5181 Encounter for therapeutic drug level monitoring: Secondary | ICD-10-CM

## 2015-03-25 DIAGNOSIS — I4891 Unspecified atrial fibrillation: Secondary | ICD-10-CM

## 2015-03-25 LAB — POCT INR: INR: 2.2

## 2015-04-04 ENCOUNTER — Other Ambulatory Visit: Payer: Self-pay | Admitting: Cardiovascular Disease

## 2015-04-08 ENCOUNTER — Ambulatory Visit (INDEPENDENT_AMBULATORY_CARE_PROVIDER_SITE_OTHER): Payer: Medicare Other | Admitting: *Deleted

## 2015-04-08 DIAGNOSIS — I4891 Unspecified atrial fibrillation: Secondary | ICD-10-CM | POA: Diagnosis not present

## 2015-04-08 DIAGNOSIS — Z5181 Encounter for therapeutic drug level monitoring: Secondary | ICD-10-CM

## 2015-04-08 LAB — POCT INR: INR: 2.2

## 2015-04-29 ENCOUNTER — Ambulatory Visit (INDEPENDENT_AMBULATORY_CARE_PROVIDER_SITE_OTHER): Payer: Medicare Other

## 2015-04-29 DIAGNOSIS — Z5181 Encounter for therapeutic drug level monitoring: Secondary | ICD-10-CM

## 2015-04-29 DIAGNOSIS — I4891 Unspecified atrial fibrillation: Secondary | ICD-10-CM | POA: Diagnosis not present

## 2015-04-29 LAB — POCT INR: INR: 2.2

## 2015-05-09 ENCOUNTER — Ambulatory Visit (INDEPENDENT_AMBULATORY_CARE_PROVIDER_SITE_OTHER): Payer: Medicare Other | Admitting: Cardiovascular Disease

## 2015-05-09 ENCOUNTER — Encounter: Payer: Self-pay | Admitting: Cardiovascular Disease

## 2015-05-09 VITALS — BP 140/80 | HR 88 | Ht 69.0 in | Wt 184.2 lb

## 2015-05-09 DIAGNOSIS — E785 Hyperlipidemia, unspecified: Secondary | ICD-10-CM

## 2015-05-09 DIAGNOSIS — I10 Essential (primary) hypertension: Secondary | ICD-10-CM

## 2015-05-09 DIAGNOSIS — I4819 Other persistent atrial fibrillation: Secondary | ICD-10-CM

## 2015-05-09 DIAGNOSIS — I255 Ischemic cardiomyopathy: Secondary | ICD-10-CM

## 2015-05-09 DIAGNOSIS — I481 Persistent atrial fibrillation: Secondary | ICD-10-CM

## 2015-05-09 DIAGNOSIS — I2510 Atherosclerotic heart disease of native coronary artery without angina pectoris: Secondary | ICD-10-CM | POA: Diagnosis not present

## 2015-05-09 NOTE — Progress Notes (Signed)
Chief Complaint  Patient presents with  . Fatigue     History of Present Illness: 81 yo male with history of CAD, HTN, HLD, PAF who is here today for cardiac follow up. He was admitted to [facility] 11/07/14 with atrial fib with RVR. LV systolic function was decreased. LVEF noted to be 25-30% with moderate MR by echo 11/07/14. Cardiac cath 11/10/14 with severe LAD stenosis. This was treated with a 2.75 x 18 mm Xience DES x 1. Moderate disease in the RCA. He was brought back for DCCV 12/30/14 and converted to sinus. Repeat echo 01/13/15 with LVEF=45-50%. No MR noted.   He is here today for follow up. He feels great. No chest pain, SOB, palpitations, dizziness, near syncope. Tolerating coumadin.  No bleeding.   Primary Care Physician: Burchette  Last Lipid Profile:Lipid Panel     Component Value Date/Time   CHOL 124 12/10/2014 1043   TRIG 74 12/10/2014 1043   HDL 53 12/10/2014 1043   CHOLHDL 2.3 12/10/2014 1043   VLDL 15 12/10/2014 1043   LDLCALC 56 12/10/2014 1043     Past Medical History  Diagnosis Date  . Allergy     hay fever, allergies  . Hyperlipidemia   . Hypertension   . CAD (coronary artery disease)     DES to mid LAD 11/10/2014  . Atrial fibrillation 11/07/2014    Past Surgical History  Procedure Laterality Date  . Appendectomy  1947  . Left heart catheterization with coronary angiogram N/A 11/10/2014    Procedure: LEFT HEART CATHETERIZATION WITH CORONARY ANGIOGRAM;  Surgeon: Wellington Hampshire, MD;  Location: [place] [facility] CATH LAB;  Service: Cardiovascular;  Laterality: N/A;  . Percutaneous coronary stent intervention (pci-s)  11/10/2014    Procedure: PERCUTANEOUS CORONARY STENT INTERVENTION (PCI-S);  Surgeon: Wellington Hampshire, MD;  Location: [facility] CATH LAB;  Service: Cardiovascular;;  . Cardioversion N/A 12/30/2014    Procedure: CARDIOVERSION;  Surgeon: Jerline Pain, MD;  Location: [facility] ENDOSCOPY;  Service: Cardiovascular;  Laterality: N/A;    Current Outpatient Prescriptions    Medication Sig Dispense Refill  . Ascorbic Acid (VITAMIN C) 100 MG tablet Take 100 mg by mouth daily.      Marland Kitchen atorvastatin (LIPITOR) 40 MG tablet Take 1 tablet (40 mg total) by mouth daily at 6 PM. 30 tablet 11  . clopidogrel (PLAVIX) 75 MG tablet Take 1 tablet (75 mg total) by mouth daily with breakfast. 30 tablet 11  . diltiazem (CARDIZEM CD) 240 MG 24 hr capsule Take 1 capsule (240 mg total) by mouth daily. 30 capsule 11  . lisinopril (PRINIVIL,ZESTRIL) 5 MG tablet Take 1 tablet (5 mg total) by mouth daily. 30 tablet 11  . metoprolol succinate (TOPROL-XL) 50 MG 24 hr tablet Take 1 tablet (50 mg total) by mouth daily. Take with or immediately following a meal. 30 tablet 11  . warfarin (COUMADIN) 5 MG tablet TAKE AS DIRECTED BY COUMADIN CLINIC 35 tablet 3   No current facility-administered medications for this visit.    No Known Allergies  Social History   Social History  . Marital Status: Married    Spouse Name: N/A  . Number of Children: N/A  . Years of Education: N/A   Occupational History  . Not on file.   Social History Main Topics  . Smoking status: Never Smoker   . Smokeless tobacco: Not on file  . Alcohol Use: No  . Drug Use: No  . Sexual Activity: Not on file   Other  Topics Concern  . Not on file   Social History Narrative    Family History  Problem Relation Age of Onset  . Cancer Mother     breast  . Cancer Father     colon    Review of Systems:  As stated in the HPI and otherwise negative.   BP 140/80 mmHg  Pulse 88  Ht 5\' 9"  (1.753 m)  Wt 184 lb 3.2 oz (83.553 kg)  BMI 27.19 kg/m2  SpO2 98%  Physical Examination: General: Well developed, well nourished, NAD HEENT: OP clear, mucus membranes moist SKIN: warm, dry. No rashes. Neuro: No focal deficits Musculoskeletal: Muscle strength 5/5 all ext Psychiatric: Mood and affect normal Neck: No JVD, no carotid bruits, no thyromegaly, no lymphadenopathy. Lungs:Clear bilaterally, no wheezes, rhonci,  crackles Cardiovascular: Irreg irreg. No murmurs, gallops or rubs. Abdomen:Soft. Bowel sounds present. Non-tender.  Extremities: No lower extremity edema. Pulses are 2 + in the bilateral DP/PT.  Echo 01/13/15: Left ventricle: The cavity size was normal. Wall thickness was increased in a pattern of mild LVH. Systolic function was mildly reduced. The estimated ejection fraction was in the range of 45% to 50%. Images were inadequate for LV wall motion assessment. The study is not technically sufficient to allow evaluation of LV diastolic function. - Left atrium: Moderately dilated at 45 ml/m2. - Right atrium: Severely dilated at 30 cm2. Impressions: - LVEF 45-50%, global hypokinesis with regional variation, Moderate LAE and severe RAE.   Cardiac cath 11/10/14: Coronary dominance: Right   Left Main: Normal  Left Anterior Descending (LAD): Normal in size with minor irregularities in the proximal segment. There is a 90% discrete stenosis in the midsegment just before the origin of the second diagonal. The rest of the vessel has minor irregularities and wraps around the apex.  1st diagonal (D1): Normal in size with 40% proximal stenosis.  2nd diagonal (D2): Normal in size with 20% ostial stenosis.  3rd diagonal (D3): Small in size with minor irregularities.  Circumflex (LCx): Normal in size with minor irregularities.  1st obtuse marginal: Small in size with minor irregularities.  2nd obtuse marginal: Medium in size with minor irregularities.  3rd obtuse marginal: Medium in size with minor irregularities.   Right Coronary Artery: Large in size and dominant. The vessel has minor irregularities. There is 50% stenosis distally before the bifurcation  Posterior descending artery: Large in size with no significant disease.  Left ventriculography: Left ventricular systolic function is moderately to severely reduced , LVEF is estimated at 30-35 %, there  is no significant mitral regurgitation   EKG:  EKG is not ordered today. The ekg ordered today demonstrates  Recent Labs: 11/07/2014: B Natriuretic Peptide 464.5*; Magnesium 2.0; TSH 3.733 12/14/2014: BUN 12; Creatinine, Ser 1.26; Hemoglobin 14.6; Platelets 169.0; Potassium 3.9; Sodium 137 12/22/2014: ALT 39   Lipid Panel    Component Value Date/Time   CHOL 124 12/10/2014 1043   TRIG 74 12/10/2014 1043   HDL 53 12/10/2014 1043   CHOLHDL 2.3 12/10/2014 1043   VLDL 15 12/10/2014 1043   LDLCALC 56 12/10/2014 1043   LDLDIRECT 191.1 01/04/2011 1218     Wt Readings from Last 3 Encounters:  05/09/15 184 lb 3.2 oz (83.553 kg)  03/24/15 185 lb (83.915 kg)  01/14/15 188 lb 6.4 oz (85.458 kg)     Other studies Reviewed: Additional studies/ records that were reviewed today include:. Review of the above records demonstrates:    Assessment and Plan:   1. CAD: Stable.  Will continue Plavix at least until February 2017 with DES placement. Continue statin and beta blocker.   2. HTN: BP controlled. No changes  3. Hyperlipidemia: Continue statin.   4. Atrial fibrillation, paroxysmal: He is in atrial fib. Will continue Cardizem and Toprol. He was changed to Coumadin and has tolerated it well.   5. Ischemic cardiomyopathy: LVEF is near normal. Continue medical therapy.    Current medicines are reviewed at length with the patient today.  The patient does not have concerns regarding medicines.  The following changes have been made:  no change  Labs/ tests ordered today include:  No orders of the defined types were placed in this encounter.    Disposition:   FU with me in 6 months  Signed, Lauree Chandler, MD 05/09/2015 2:01 PM    [facility] [employer], [place], [place]  [address] Phone: [phone]; Fax: [phone]

## 2015-05-09 NOTE — Patient Instructions (Signed)
Medication Instructions:  Your physician recommends that you continue on your current medications as directed. Please refer to the Current Medication list given to you today.   Labwork: none  Testing/Procedures: none  Follow-Up: Your physician wants you to follow-up in: 6 months.  You will receive a reminder letter in the mail two months in advance. If you don't receive a letter, please call our office to schedule the follow-up appointment.       

## 2015-06-01 ENCOUNTER — Ambulatory Visit (INDEPENDENT_AMBULATORY_CARE_PROVIDER_SITE_OTHER): Payer: Medicare Other | Admitting: Pharmacist

## 2015-06-01 DIAGNOSIS — I4891 Unspecified atrial fibrillation: Secondary | ICD-10-CM | POA: Diagnosis not present

## 2015-06-01 DIAGNOSIS — Z5181 Encounter for therapeutic drug level monitoring: Secondary | ICD-10-CM

## 2015-06-01 DIAGNOSIS — I481 Persistent atrial fibrillation: Secondary | ICD-10-CM | POA: Diagnosis not present

## 2015-06-01 DIAGNOSIS — I4819 Other persistent atrial fibrillation: Secondary | ICD-10-CM

## 2015-06-01 LAB — POCT INR: INR: 1.7

## 2015-06-27 ENCOUNTER — Ambulatory Visit (INDEPENDENT_AMBULATORY_CARE_PROVIDER_SITE_OTHER): Payer: Medicare Other | Admitting: *Deleted

## 2015-06-27 DIAGNOSIS — I4891 Unspecified atrial fibrillation: Secondary | ICD-10-CM | POA: Diagnosis not present

## 2015-06-27 DIAGNOSIS — I481 Persistent atrial fibrillation: Secondary | ICD-10-CM

## 2015-06-27 DIAGNOSIS — Z5181 Encounter for therapeutic drug level monitoring: Secondary | ICD-10-CM | POA: Diagnosis not present

## 2015-06-27 DIAGNOSIS — I4819 Other persistent atrial fibrillation: Secondary | ICD-10-CM

## 2015-06-27 LAB — POCT INR: INR: 2.1

## 2015-06-28 ENCOUNTER — Telehealth: Payer: Self-pay | Admitting: Cardiovascular Disease

## 2015-06-28 NOTE — Telephone Encounter (Signed)
New message    Wife calling    Pt c/o medication issue:  1. Name of Medication: lipitor 40 mg   2. How are you currently taking this medication (dosage and times per day)? Once a day at 6 pm   3. Are you having a reaction (difficulty breathing--STAT)? No   4. What is your medication issue? In the office on yesterday - when should he have another blood test or stop medication.

## 2015-06-28 NOTE — Telephone Encounter (Signed)
Spoke with pt's wife and told him pt would be due to have lipid and liver profiles in March 2017.  Pt is due to see Dr. Angelena Form about the same time.  Wife made aware pt should continue same dose atorvastatin.

## 2015-07-29 ENCOUNTER — Ambulatory Visit (INDEPENDENT_AMBULATORY_CARE_PROVIDER_SITE_OTHER): Payer: Medicare Other | Admitting: Pharmacist

## 2015-07-29 DIAGNOSIS — I481 Persistent atrial fibrillation: Secondary | ICD-10-CM

## 2015-07-29 DIAGNOSIS — I4819 Other persistent atrial fibrillation: Secondary | ICD-10-CM

## 2015-07-29 DIAGNOSIS — Z5181 Encounter for therapeutic drug level monitoring: Secondary | ICD-10-CM

## 2015-07-29 DIAGNOSIS — I4891 Unspecified atrial fibrillation: Secondary | ICD-10-CM | POA: Diagnosis not present

## 2015-07-29 LAB — POCT INR: INR: 1.6

## 2015-08-17 ENCOUNTER — Other Ambulatory Visit: Payer: Self-pay | Admitting: Cardiovascular Disease

## 2015-08-23 ENCOUNTER — Ambulatory Visit (INDEPENDENT_AMBULATORY_CARE_PROVIDER_SITE_OTHER): Payer: Medicare Other | Admitting: Pharmacist

## 2015-08-23 DIAGNOSIS — I4891 Unspecified atrial fibrillation: Secondary | ICD-10-CM

## 2015-08-23 DIAGNOSIS — Z5181 Encounter for therapeutic drug level monitoring: Secondary | ICD-10-CM | POA: Diagnosis not present

## 2015-08-23 DIAGNOSIS — I4819 Other persistent atrial fibrillation: Secondary | ICD-10-CM

## 2015-08-23 DIAGNOSIS — I481 Persistent atrial fibrillation: Secondary | ICD-10-CM | POA: Diagnosis not present

## 2015-08-23 LAB — POCT INR: INR: 2.7

## 2015-09-16 ENCOUNTER — Ambulatory Visit (INDEPENDENT_AMBULATORY_CARE_PROVIDER_SITE_OTHER): Payer: Medicare Other | Admitting: Pharmacist

## 2015-09-16 DIAGNOSIS — Z5181 Encounter for therapeutic drug level monitoring: Secondary | ICD-10-CM | POA: Diagnosis not present

## 2015-09-16 DIAGNOSIS — I481 Persistent atrial fibrillation: Secondary | ICD-10-CM | POA: Diagnosis not present

## 2015-09-16 DIAGNOSIS — I4891 Unspecified atrial fibrillation: Secondary | ICD-10-CM

## 2015-09-16 DIAGNOSIS — I4819 Other persistent atrial fibrillation: Secondary | ICD-10-CM

## 2015-09-16 LAB — POCT INR: INR: 2.1

## 2015-10-08 ENCOUNTER — Other Ambulatory Visit: Payer: Self-pay | Admitting: Cardiovascular Disease

## 2015-10-10 ENCOUNTER — Emergency Department (HOSPITAL_COMMUNITY): Payer: Medicare Other

## 2015-10-10 ENCOUNTER — Encounter (HOSPITAL_COMMUNITY): Payer: Self-pay | Admitting: Family Medicine

## 2015-10-10 ENCOUNTER — Inpatient Hospital Stay (HOSPITAL_COMMUNITY)
Admission: EM | Admit: 2015-10-10 | Discharge: 2015-10-14 | DRG: 309 | Disposition: A | Discharge status: Home / Self Care | Payer: Medicare Other | Attending: Internal Medicine | Admitting: Internal Medicine

## 2015-10-10 DIAGNOSIS — Z853 Personal history of malignant neoplasm of breast: Secondary | ICD-10-CM | POA: Diagnosis not present

## 2015-10-10 DIAGNOSIS — I482 Chronic atrial fibrillation: Secondary | ICD-10-CM

## 2015-10-10 DIAGNOSIS — I480 Paroxysmal atrial fibrillation: Secondary | ICD-10-CM | POA: Diagnosis not present

## 2015-10-10 DIAGNOSIS — E785 Hyperlipidemia, unspecified: Secondary | ICD-10-CM | POA: Diagnosis present

## 2015-10-10 DIAGNOSIS — Z79899 Other long term (current) drug therapy: Secondary | ICD-10-CM

## 2015-10-10 DIAGNOSIS — I481 Persistent atrial fibrillation: Principal | ICD-10-CM

## 2015-10-10 DIAGNOSIS — I130 Hypertensive heart and chronic kidney disease with heart failure and stage 1 through stage 4 chronic kidney disease, or unspecified chronic kidney disease: Secondary | ICD-10-CM | POA: Diagnosis present

## 2015-10-10 DIAGNOSIS — I255 Ischemic cardiomyopathy: Secondary | ICD-10-CM | POA: Diagnosis present

## 2015-10-10 DIAGNOSIS — I5022 Chronic systolic (congestive) heart failure: Secondary | ICD-10-CM | POA: Diagnosis present

## 2015-10-10 DIAGNOSIS — B029 Zoster without complications: Secondary | ICD-10-CM | POA: Diagnosis not present

## 2015-10-10 DIAGNOSIS — E871 Hypo-osmolality and hyponatremia: Secondary | ICD-10-CM | POA: Diagnosis not present

## 2015-10-10 DIAGNOSIS — Z7901 Long term (current) use of anticoagulants: Secondary | ICD-10-CM | POA: Diagnosis not present

## 2015-10-10 DIAGNOSIS — Z9049 Acquired absence of other specified parts of digestive tract: Secondary | ICD-10-CM | POA: Diagnosis not present

## 2015-10-10 DIAGNOSIS — Z955 Presence of coronary angioplasty implant and graft: Secondary | ICD-10-CM | POA: Diagnosis not present

## 2015-10-10 DIAGNOSIS — I4891 Unspecified atrial fibrillation: Secondary | ICD-10-CM | POA: Diagnosis not present

## 2015-10-10 DIAGNOSIS — Z8619 Personal history of other infectious and parasitic diseases: Secondary | ICD-10-CM

## 2015-10-10 DIAGNOSIS — R0602 Shortness of breath: Secondary | ICD-10-CM | POA: Diagnosis not present

## 2015-10-10 DIAGNOSIS — N182 Chronic kidney disease, stage 2 (mild): Secondary | ICD-10-CM | POA: Diagnosis present

## 2015-10-10 DIAGNOSIS — B028 Zoster with other complications: Secondary | ICD-10-CM | POA: Diagnosis not present

## 2015-10-10 DIAGNOSIS — Z800 Family history of malignant neoplasm of digestive organs: Secondary | ICD-10-CM | POA: Diagnosis not present

## 2015-10-10 DIAGNOSIS — I2510 Atherosclerotic heart disease of native coronary artery without angina pectoris: Secondary | ICD-10-CM | POA: Diagnosis present

## 2015-10-10 DIAGNOSIS — I517 Cardiomegaly: Secondary | ICD-10-CM | POA: Diagnosis not present

## 2015-10-10 LAB — PROTIME-INR
INR: 1.86 — ABNORMAL HIGH (ref 0.00–1.49)
PROTHROMBIN TIME: 21.4 s — AB (ref 11.6–15.2)

## 2015-10-10 LAB — BASIC METABOLIC PANEL
ANION GAP: 12 (ref 5–15)
BUN: 11 mg/dL (ref 6–20)
CALCIUM: 9 mg/dL (ref 8.9–10.3)
CHLORIDE: 98 mmol/L — AB (ref 101–111)
CO2: 25 mmol/L (ref 22–32)
Creatinine, Ser: 1.34 mg/dL — ABNORMAL HIGH (ref 0.61–1.24)
GFR calc non Af Amer: 47 mL/min — ABNORMAL LOW (ref >60)
GFR, EST AFRICAN AMERICAN: 54 mL/min — AB (ref >60)
Glucose, Bld: 115 mg/dL — ABNORMAL HIGH (ref 65–99)
Potassium: 4 mmol/L (ref 3.5–5.1)
SODIUM: 135 mmol/L (ref 135–145)

## 2015-10-10 LAB — CBC
HCT: 48.3 % (ref 39.0–52.0)
HEMOGLOBIN: 17.1 g/dL — AB (ref 13.0–17.0)
MCH: 30.5 pg (ref 26.0–34.0)
MCHC: 35.4 g/dL (ref 30.0–36.0)
MCV: 86.3 fL (ref 78.0–100.0)
PLATELETS: 116 10*3/uL — AB (ref 150–400)
RBC: 5.6 MIL/uL (ref 4.22–5.81)
RDW: 14.6 % (ref 11.5–15.5)
WBC: 9 10*3/uL (ref 4.0–10.5)

## 2015-10-10 LAB — I-STAT TROPONIN, ED: TROPONIN I, POC: 0.06 ng/mL (ref 0.00–0.08)

## 2015-10-10 MED ORDER — DILTIAZEM LOAD VIA INFUSION
20.0000 mg | Freq: Once | INTRAVENOUS | Status: AC
  Administered 2015-10-10: 20 mg via INTRAVENOUS
  Filled 2015-10-10: qty 20

## 2015-10-10 MED ORDER — WARFARIN SODIUM 7.5 MG PO TABS
7.5000 mg | ORAL_TABLET | Freq: Once | ORAL | Status: AC
  Administered 2015-10-10: 7.5 mg via ORAL
  Filled 2015-10-10 (×2): qty 1

## 2015-10-10 MED ORDER — VALACYCLOVIR HCL 500 MG PO TABS
1000.0000 mg | ORAL_TABLET | Freq: Once | ORAL | Status: AC
  Administered 2015-10-10: 1000 mg via ORAL
  Filled 2015-10-10: qty 2

## 2015-10-10 MED ORDER — ACETAMINOPHEN 325 MG PO TABS
650.0000 mg | ORAL_TABLET | Freq: Once | ORAL | Status: AC
  Administered 2015-10-10: 650 mg via ORAL
  Filled 2015-10-10: qty 2

## 2015-10-10 MED ORDER — ATORVASTATIN CALCIUM 40 MG PO TABS
40.0000 mg | ORAL_TABLET | Freq: Every day | ORAL | Status: DC
  Administered 2015-10-11 – 2015-10-13 (×3): 40 mg via ORAL
  Filled 2015-10-10 (×3): qty 1

## 2015-10-10 MED ORDER — MORPHINE SULFATE (PF) 2 MG/ML IV SOLN: 1.0000 mg | INTRAVENOUS | Status: DC | PRN

## 2015-10-10 MED ORDER — DILTIAZEM HCL ER COATED BEADS 240 MG PO CP24: 240.0000 mg | ORAL_CAPSULE | Freq: Every day | ORAL | Status: DC

## 2015-10-10 MED ORDER — ACETAMINOPHEN 325 MG PO TABS
650.0000 mg | ORAL_TABLET | ORAL | Status: DC | PRN
  Administered 2015-10-10 – 2015-10-14 (×6): 650 mg via ORAL
  Filled 2015-10-10 (×6): qty 2

## 2015-10-10 MED ORDER — CLOPIDOGREL BISULFATE 75 MG PO TABS
75.0000 mg | ORAL_TABLET | Freq: Every day | ORAL | Status: DC
  Administered 2015-10-11 – 2015-10-14 (×4): 75 mg via ORAL
  Filled 2015-10-10 (×4): qty 1

## 2015-10-10 MED ORDER — METOPROLOL SUCCINATE ER 50 MG PO TB24: 50.0000 mg | ORAL_TABLET | Freq: Every day | ORAL | Status: DC

## 2015-10-10 MED ORDER — DILTIAZEM HCL 100 MG IV SOLR
5.0000 mg/h | INTRAVENOUS | Status: DC
  Administered 2015-10-10: 5 mg/h via INTRAVENOUS
  Administered 2015-10-11: 10 mg/h via INTRAVENOUS
  Administered 2015-10-11: 14 mg/h via INTRAVENOUS
  Administered 2015-10-11: 15 mg/h via INTRAVENOUS
  Filled 2015-10-10 (×6): qty 100

## 2015-10-10 MED ORDER — VALACYCLOVIR HCL 500 MG PO TABS
1000.0000 mg | ORAL_TABLET | Freq: Two times a day (BID) | ORAL | Status: DC
  Administered 2015-10-11: 1000 mg via ORAL
  Filled 2015-10-10 (×2): qty 2

## 2015-10-10 MED ORDER — WARFARIN - PHARMACIST DOSING INPATIENT
Freq: Every day | Status: DC
  Administered 2015-10-11: 18:00:00

## 2015-10-10 MED ORDER — SODIUM CHLORIDE 0.9 % IV SOLN: INTRAVENOUS | Status: AC

## 2015-10-10 MED ORDER — LISINOPRIL 5 MG PO TABS
5.0000 mg | ORAL_TABLET | Freq: Every day | ORAL | Status: DC
  Administered 2015-10-11 – 2015-10-14 (×4): 5 mg via ORAL
  Filled 2015-10-10 (×4): qty 1

## 2015-10-10 NOTE — ED Provider Notes (Addendum)
CSN: [id number]     Arrival date & time 10/10/15  1105 History   First MD Initiated Contact with Patient 10/10/15 1147     Chief Complaint  Patient presents with  . Tachycardia     (Consider location/radiation/quality/duration/timing/severity/associated sxs/prior Treatment) HPI Comments: 82 year-old male with history of high blood pressure height lipids, atrial fibrillation, CAD, cardiomyopathy follows with cardiology, on warfarin presents with fatigue and shortness of breath. Patient is felt generally unwell the past 3 or 4 days. Patient's also had herpes zoster like rash in the right neck and face for which his been itching. Patient does not feel any comes in the atrial fibrillation. Patient missed a dose of blood pressure medicines yesterday. Patient on Coumadin. Primary concern currently is fatigue. Intermittent shortness of breath sometimes with exertion. No current chest pain.  The history is provided by the patient and medical records.    Past Medical History  Diagnosis Date  . Allergy     hay fever, allergies  . Hyperlipidemia   . Hypertension   . CAD (coronary artery disease)     DES to mid LAD 11/10/2014  . Atrial fibrillation ([place]) 11/07/2014   Past Surgical History  Procedure Laterality Date  . Appendectomy  1947  . Left heart catheterization with coronary angiogram N/A 11/10/2014    Procedure: LEFT HEART CATHETERIZATION WITH CORONARY ANGIOGRAM;  Surgeon: Wellington Hampshire, MD;  Location: [facility] CATH LAB;  Service: Cardiovascular;  Laterality: N/A;  . Percutaneous coronary stent intervention (pci-s)  11/10/2014    Procedure: PERCUTANEOUS CORONARY STENT INTERVENTION (PCI-S);  Surgeon: Wellington Hampshire, MD;  Location: [facility] CATH LAB;  Service: Cardiovascular;;  . Cardioversion N/A 12/30/2014    Procedure: CARDIOVERSION;  Surgeon: Jerline Pain, MD;  Location: [facility] ENDOSCOPY;  Service: Cardiovascular;  Laterality: N/A;   Family History  Problem Relation Age of Onset  . Cancer Mother      breast  . Cancer Father     colon   Social History  Substance Use Topics  . Smoking status: Never Smoker   . Smokeless tobacco: None  . Alcohol Use: No    Review of Systems  Constitutional: Negative for fever and chills.  HENT: Negative for congestion.   Eyes: Negative for visual disturbance.  Respiratory: Positive for shortness of breath.   Cardiovascular: Negative for chest pain.  Gastrointestinal: Negative for vomiting and abdominal pain.  Genitourinary: Negative for dysuria and flank pain.  Musculoskeletal: Negative for back pain, neck pain and neck stiffness.  Skin: Positive for rash.  Neurological: Negative for light-headedness and headaches.      Allergies  Review of patient's allergies indicates no known allergies.  Home Medications   Prior to Admission medications   Medication Sig Start Date End Date Taking? Authorizing Provider  acetaminophen (TYLENOL) 500 MG tablet Take 500 mg by mouth every 4 (four) hours as needed for fever (pain).   Yes Historical Provider, MD  Ascorbic Acid (VITAMIN C) 1000 MG tablet Take 1,000 mg by mouth daily.   Yes Historical Provider, MD  atorvastatin (LIPITOR) 40 MG tablet Take 1 tablet (40 mg total) by mouth daily at 6 PM. Patient taking differently: Take 40 mg by mouth daily after supper.  11/12/14  Yes Brett Canales, PA-C  clopidogrel (PLAVIX) 75 MG tablet Take 1 tablet (75 mg total) by mouth daily with breakfast. 11/12/14  Yes Brett Canales, PA-C  diltiazem (CARDIZEM CD) 240 MG 24 hr capsule Take 1 capsule (240 mg total) by mouth  daily. Patient taking differently: Take 240 mg by mouth daily at 2 PM. 2pm 11/12/14  Yes Brett Canales, PA-C  lisinopril (PRINIVIL,ZESTRIL) 5 MG tablet Take 1 tablet (5 mg total) by mouth daily. Patient taking differently: Take 5 mg by mouth daily with breakfast.  11/12/14  Yes Brett Canales, PA-C  metoprolol succinate (TOPROL-XL) 50 MG 24 hr tablet Take 1 tablet (50 mg total) by mouth daily. Take with or  immediately following a meal. Patient taking differently: Take 50 mg by mouth daily after supper. Take with or immediately following a meal. 11/12/14  Yes Brett Canales, PA-C  warfarin (COUMADIN) 5 MG tablet TAKE AS DIRECTED BY COUMADIN CLINIC Patient taking differently: TAKE 1 1/2 TABLET BY MOUTH  ON FRIDAYS AT BEDTIME, TAKE 1 TABLET DAILY AT BEDTIME ON ALL OTHER DAYS OR AS DIRECTED BY COUMADIN CLINIC 08/17/15  Yes Burnell Blanks, MD   BP 139/80 mmHg  Pulse 110  Temp(Src) 98.3 F (36.8 C) (Oral)  Resp 16  SpO2 98% Physical Exam  Constitutional: He is oriented to person, place, and time. He appears well-developed and well-nourished.  HENT:  Head: Normocephalic and atraumatic.  Patient has vesicles and macules with excoriation right lateral lower face and neck. Does not cross midline. Mild dry mix membranes.  Eyes: Right eye exhibits no discharge. Left eye exhibits no discharge.  Neck: Normal range of motion. Neck supple. No tracheal deviation present.  Cardiovascular: An irregularly irregular rhythm present. Tachycardia present.   Pulmonary/Chest: Effort normal and breath sounds normal.  Abdominal: Soft. He exhibits no distension. There is no tenderness. There is no guarding.  Musculoskeletal: He exhibits no edema.  Neurological: He is alert and oriented to person, place, and time. No cranial nerve deficit.  No arm drift equal strength in extremities 5+.  Skin: Skin is warm. No rash noted.  Psychiatric: He has a normal mood and affect.  Nursing note and vitals reviewed.   ED Course  Procedures (including critical care time) CRITICAL CARE Performed by: Mariea Clonts   Total critical care time: 35 minutes  Critical care time was exclusive of separately billable procedures and treating other patients.  Critical care was necessary to treat or prevent imminent or life-threatening deterioration.  Critical care was time spent personally by me on the following activities:  development of treatment plan with patient and/or surrogate as well as nursing, discussions with consultants, evaluation of patient's response to treatment, examination of patient, obtaining history from patient or surrogate, ordering and performing treatments and interventions, ordering and review of laboratory studies, ordering and review of radiographic studies, pulse oximetry and re-evaluation of patient's condition.  Labs Review Labs Reviewed  BASIC METABOLIC PANEL - Abnormal; Notable for the following:    Chloride 98 (*)    Glucose, Bld 115 (*)    Creatinine, Ser 1.34 (*)    GFR calc non Af Amer 47 (*)    GFR calc Af Amer 54 (*)    All other components within normal limits  CBC - Abnormal; Notable for the following:    Hemoglobin 17.1 (*)    Platelets 116 (*)    All other components within normal limits  PROTIME-INR - Abnormal; Notable for the following:    Prothrombin Time 21.4 (*)    INR 1.86 (*)    All other components within normal limits  I-STAT TROPOININ, ED    Imaging Review Dg Chest 2 View  10/10/2015  CLINICAL DATA:  Shortness of breath for 2  days EXAM: CHEST  2 VIEW COMPARISON:  October 10, 2015 FINDINGS: There is patchy left base atelectasis. There is no edema or consolidation. Heart is upper normal in size with pulmonary vascularity within normal limits. No adenopathy. No bone lesions. IMPRESSION: Left base atelectasis.  No frank edema or consolidation. Electronically Signed   By: Lowella Grip III M.D.   On: 10/10/2015 15:16   Dg Chest Portable 1 View  10/10/2015  CLINICAL DATA:  Forty history of heart probable EXAM: PORTABLE CHEST 1 VIEW COMPARISON:  11/07/2014. FINDINGS: 1245 hours. No focal airspace consolidation or pulmonary edema. No pleural effusion. Wedge-shaped opacity over the right upper lung is probably related to clothing or material seen over the soft tissues of the right shoulder. The cardio pericardial silhouette is enlarged. The visualized bony  structures of the thorax are intact. Telemetry leads overlie the chest. IMPRESSION: Wedge-shaped opacity over the right apex. Repeat PA and lateral chest x-ray recommended, when the patient is able, without overlying clothing. Electronically Signed   By: Misty Stanley M.D.   On: 10/10/2015 12:55   I have personally reviewed and evaluated these images and lab results as part of my medical decision-making.   EKG Interpretation   Date/Time:  Monday October 10 2015 11:09:11 EST Ventricular Rate:  143 PR Interval:    QRS Duration: 74 QT Interval:  320 QTC Calculation: 493 R Axis:   -108 Text Interpretation:  Afib/ flutter Poor baseline, nonspecific ST changes,  tachycardia Confirmed by Kelliann Pendergraph  MD, Laverle Pillard ([id number]) on 10/10/2015 11:56:05  AM      MDM   Final diagnoses:  Atrial fibrillation with rapid ventricular response (HCC)  Herpes zoster   Patient presents with shortness of breath and clinically atrial fibrillation rapid ventricular response. Patient has clinically herpes zoster with no obvious sign of infection however mild erythema and drainage. No fever, normal white blood cell count.  HR improved with titration.  Cardiology evaluated recommended medicine admit. Heart rate improved in the ER. Patient has contact and resp precautions for zoster. Cards recs pending. Cardizem bolus and drip with titration ordered. Cardiology consult to discuss cardioversion versus admission to the hospital.  Filed Vitals:   10/10/15 1403 10/10/15 1445 10/10/15 1525 10/10/15 1526  BP:  133/87 139/80   Pulse: 112 105  110  Temp:      TempSrc:      Resp: 15 20  16   SpO2: 97% 99%  98%       Elnora Morrison, MD 10/10/15 [place], MD 10/10/15 1544

## 2015-10-10 NOTE — ED Notes (Signed)
Pt here for 2 days of SOB and rash to face and neck. Wounds open, bleeding and oozing.. Pt tachy at 145 on the monitor.,sts the rash is burning.

## 2015-10-10 NOTE — H&P (Addendum)
History and Physical  Ricardo Valencia [id number] DOB: March 05, 1929 DOA: 10/10/2015  PCP: Eulas Post, MD   Chief Complaint: dyspnea, rash.  History of Present Illness:  Patient is a 82 yo male with history of HTN, HLD, CAD s/p stent to mid LAD in Feb 2016, PAF on coumadin, cardiomyopathy who presented with cc of dyspnea and dizziness that occurred sporadically over the past 2 days with feeling of presyncope. No chest pain/cough/fever/chills. He also has had rash described as itchy and painful on the right side of his face/neck/upper chest that has gotten progressively worse with vesicles bursting open. The rash appeared first on Thu/Fri. No other complaints. No N/V/D/C/abd pain/dysuria.  Review of Systems:  CONSTITUTIONAL:  No night sweats.  No fatigue, malaise, lethargy.  No fever or chills. Eyes:  No visual changes.  No eye pain.  No eye discharge.   ENT:    No epistaxis.  No sinus pain.  No sore throat.  No ear pain.  No congestion. RESPIRATORY:  No cough.  No wheeze.  No hemoptysis.  +shortness of breath. CARDIOVASCULAR:  No chest pains.  +palpitations. GASTROINTESTINAL:  No abdominal pain.  No nausea or vomiting.  No diarrhea or constipation.  No hematemesis.  No hematochezia.  No melena. GENITOURINARY:  No urgency.  No frequency.  No dysuria.  No hematuria.  No obstructive symptoms.  No discharge.  No pain.  No significant abnormal bleeding. MUSCULOSKELETAL:  No musculoskeletal pain.  No joint swelling.  No arthritis. NEUROLOGICAL:  No confusion.  No weakness. No headache. No seizure. PSYCHIATRIC:  No depression. No anxiety. No suicidal ideation. SKIN:  +rashes.  +lesions.  +wounds. ENDOCRINE:  No unexplained weight loss.  No polydipsia.  No polyuria.  No polyphagia. HEMATOLOGIC:  No anemia.  No purpura.  No petechiae.  No bleeding.  ALLERGIC AND IMMUNOLOGIC:  +pruritus.  No swelling Other:  Past Medical and Surgical History:   Past Medical History  Diagnosis  Date  . Allergy     hay fever, allergies  . Hyperlipidemia   . Hypertension   . CAD (coronary artery disease)     DES to mid LAD 11/10/2014  . Atrial fibrillation ([place]) 11/07/2014   Past Surgical History  Procedure Laterality Date  . Appendectomy  1947  . Left heart catheterization with coronary angiogram N/A 11/10/2014    Procedure: LEFT HEART CATHETERIZATION WITH CORONARY ANGIOGRAM;  Surgeon: Wellington Hampshire, MD;  Location: [facility] CATH LAB;  Service: Cardiovascular;  Laterality: N/A;  . Percutaneous coronary stent intervention (pci-s)  11/10/2014    Procedure: PERCUTANEOUS CORONARY STENT INTERVENTION (PCI-S);  Surgeon: Wellington Hampshire, MD;  Location: [facility] CATH LAB;  Service: Cardiovascular;;  . Cardioversion N/A 12/30/2014    Procedure: CARDIOVERSION;  Surgeon: Jerline Pain, MD;  Location: [facility] ENDOSCOPY;  Service: Cardiovascular;  Laterality: N/A;    Social History:   reports that he has never smoked. He does not have any smokeless tobacco history on file. He reports that he does not drink alcohol or use illicit drugs.   No Known Allergies  Family History  Problem Relation Age of Onset  . Cancer Mother     breast  . Cancer Father     colon      Prior to Admission medications   Medication Sig Start Date End Date Taking? Authorizing Provider  acetaminophen (TYLENOL) 500 MG tablet Take 500 mg by mouth every 4 (four) hours as needed for fever (pain).   Yes Historical Provider, MD  Ascorbic Acid (  VITAMIN C) 1000 MG tablet Take 1,000 mg by mouth daily.   Yes Historical Provider, MD  atorvastatin (LIPITOR) 40 MG tablet Take 1 tablet (40 mg total) by mouth daily at 6 PM. Patient taking differently: Take 40 mg by mouth daily after supper.  11/12/14  Yes Brett Canales, PA-C  clopidogrel (PLAVIX) 75 MG tablet Take 1 tablet (75 mg total) by mouth daily with breakfast. 11/12/14  Yes Brett Canales, PA-C  lisinopril (PRINIVIL,ZESTRIL) 5 MG tablet Take 1 tablet (5 mg total) by mouth daily. Patient  taking differently: Take 5 mg by mouth daily with breakfast.  11/12/14  Yes Brett Canales, PA-C  metoprolol succinate (TOPROL-XL) 50 MG 24 hr tablet Take 1 tablet (50 mg total) by mouth daily. Take with or immediately following a meal. Patient taking differently: Take 50 mg by mouth daily after supper. Take with or immediately following a meal. 11/12/14  Yes Brett Canales, PA-C  warfarin (COUMADIN) 5 MG tablet TAKE AS DIRECTED BY COUMADIN CLINIC Patient taking differently: TAKE 1 1/2 TABLET BY MOUTH  ON FRIDAYS AT BEDTIME, TAKE 1 TABLET DAILY AT BEDTIME ON ALL OTHER DAYS OR AS DIRECTED BY COUMADIN CLINIC 08/17/15  Yes Burnell Blanks, MD  CARTIA XT 240 MG 24 hr capsule TAKE ONE CAPSULE BY MOUTH ONCE DAILY 10/10/15   Burnell Blanks, MD    Physical Exam: BP 159/97 mmHg  Pulse 78  Temp(Src) 98.3 F (36.8 C) (Oral)  Resp 21  SpO2 97%  GENERAL : Well developed, well nourished, alert and cooperative, and appears to be in no acute distress. EYES: PERRL, EOMI.  EARS:  hearing grossly intact. NOSE: No nasal discharge. THROAT: Oral cavity and pharynx normal. NECK: Neck supple, CARDIAC: irregulary R/R. No murmurs.  There is no peripheral edema,  LUNGS: Clear to auscultation  ABDOMEN: Positive bowel sounds. Soft, nondistended, nontender. No guarding or rebound. No masses. NEUROLOGICAL: The mental examination revealed the patient was oriented to person, place, and time.CN II-XII intact. S SKIN: skin abrasions with crusting over the right side of face/neck. PSYCHIATRIC:  The patient was able to demonstrate good judgement and reason, without hallucinations, abnormal affect or abnormal behaviors during the examination. Patient is not suicidal.          Labs on Admission:  Reviewed.   Radiological Exams on Admission: Dg Chest 2 View  10/10/2015  CLINICAL DATA:  Shortness of breath for 2 days EXAM: CHEST  2 VIEW COMPARISON:  October 10, 2015 FINDINGS: There is patchy left base  atelectasis. There is no edema or consolidation. Heart is upper normal in size with pulmonary vascularity within normal limits. No adenopathy. No bone lesions. IMPRESSION: Left base atelectasis.  No frank edema or consolidation. Electronically Signed   By: Lowella Grip III M.D.   On: 10/10/2015 15:16   Dg Chest Portable 1 View  10/10/2015  CLINICAL DATA:  Forty history of heart probable EXAM: PORTABLE CHEST 1 VIEW COMPARISON:  11/07/2014. FINDINGS: 1245 hours. No focal airspace consolidation or pulmonary edema. No pleural effusion. Wedge-shaped opacity over the right upper lung is probably related to clothing or material seen over the soft tissues of the right shoulder. The cardio pericardial silhouette is enlarged. The visualized bony structures of the thorax are intact. Telemetry leads overlie the chest. IMPRESSION: Wedge-shaped opacity over the right apex. Repeat PA and lateral chest x-ray recommended, when the patient is able, without overlying clothing. Electronically Signed   By: Misty Stanley M.D.   On:  10/10/2015 12:55    EKG:  Independently reviewed. Afib/RVR  Assessment/Plan  Afib w/RVR: Placed on cardizem drip Restart PO meds: Toprolol while titrating down drip. Hold CD per cardio No Chest pain. Appreciate cardio recs. Initial trop#1 neg On coumadin, INR subtheraputic, pharmacy consulted for comadin dosing CHADSVASC score of at least 5 (CHF 1, HTN 1, Age 45, CAD 1).   Herpes zoster: started on valcyclovir 1 gm BID, consult to wound care and ID, morphine prn pain, isolation precautions.  HTN: cont home meds  CAD: cont statin and plavix.  CKD: Cr at baseline.   DVT prophylaxis:  On coumadin Consultants: Cardio / ID Code Status: Full    Gennaro Africa M.D [facility]

## 2015-10-10 NOTE — Consult Note (Signed)
CARDIOLOGY CONSULT NOTE   Patient ID: Ricardo Valencia MRN: [id number], DOB/AGE: 03/08/1929   Admit date: 10/10/2015 Date of Consult: 10/10/2015   Primary Physician: Eulas Post, MD Primary Cardiologist: Dr. Angelena Form  Pt. 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 Allergy     hay fever, allergies  . Hyperlipidemia   . Hypertension   . CAD (coronary artery disease)     DES to mid LAD 11/10/2014  . Atrial fibrillation ([place]) 11/07/2014    Past Surgical History  Procedure Laterality Date  . Appendectomy  1947  . Left heart catheterization with coronary angiogram N/A 11/10/2014    Procedure: LEFT HEART CATHETERIZATION WITH CORONARY ANGIOGRAM;  Surgeon: Wellington Hampshire, MD;  Location: [facility] CATH LAB;  Service: Cardiovascular;  Laterality: N/A;  . Percutaneous coronary stent intervention (pci-s)  11/10/2014    Procedure: PERCUTANEOUS CORONARY STENT INTERVENTION (PCI-S);  Surgeon: Wellington Hampshire, MD;  Location: [facility] CATH LAB;  Service: Cardiovascular;;  . 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 He was admitted to [facility] in February 2016 with A. fib with RVR. Echocardiogram obtained during the admission showed EF 25-30% with moderate MR. He underwent cardiac catheterization on 11/10/2014 which showed severe  mid LAD stenosis treated with 2.75 x 16 mm Xience DES, moderate disease in RCA. He was initially placed on eliquis and Plavix, eliquis was later changed to Coumadin. He came back and underwent DC cardioversion in April 2016. Repeat echocardiogram in April showed EF has went back to 45-50%, no significant MR noted. He was last seen by Dr. Angelena Form August 2016, at which time he was back in afib by physical exam but otherwise doing well from cardiology standpoint.  Since last Thursday, he started having rash across the right jaw, right lips, and the right shoulder. They formed blisters and several popped. The blister appears to be pruritic but no pain. He lives with his wife, however his wife did not have similar symptom. He has a history of chickenpox when he was young. He denies any other fever, chill, or cough. He denies any recent lower extremity edema, orthopnea or paroxysmal nocturnal dyspnea. On the same day, he started noticing shortness breath during activity. It eventually worsened to the degree he decided to seek medical attention at [facility] on 10/10/2015. On arrival he was noted to be in atrial fibrillation with RVR. INR was subtherapeutic at 1.8. He states he has been compliant with his medications, the only medication he missed was a single tablet yesterday. Cardiology has been consulted for A. fib with RVR. Initial chest x-ray showed a wedge shaped opacity in the right upper lobe, however repeat PA lateral x-ray did not mention any opacity.   No current facility-administered medications on file prior to encounter.   Current Outpatient Prescriptions on  File Prior to Encounter  Medication Sig Dispense Refill  . atorvastatin (LIPITOR) 40 MG tablet Take 1 tablet (40 mg total) by mouth daily at 6 PM. (Patient taking differently: Take 40 mg by mouth daily after supper. ) 30 tablet 11  . clopidogrel (PLAVIX) 75 MG tablet Take 1 tablet (75 mg total) by mouth daily with breakfast. 30 tablet  11  . diltiazem (CARDIZEM CD) 240 MG 24 hr capsule Take 1 capsule (240 mg total) by mouth daily. (Patient taking differently: Take 240 mg by mouth daily at 2 PM. 2pm) 30 capsule 11  . lisinopril (PRINIVIL,ZESTRIL) 5 MG tablet Take 1 tablet (5 mg total) by mouth daily. (Patient taking differently: Take 5 mg by mouth daily with breakfast. ) 30 tablet 11  . metoprolol succinate (TOPROL-XL) 50 MG 24 hr tablet Take 1 tablet (50 mg total) by mouth daily. Take with or immediately following a meal. (Patient taking differently: Take 50 mg by mouth daily after supper. Take with or immediately following a meal.) 30 tablet 11  . warfarin (COUMADIN) 5 MG tablet TAKE AS DIRECTED BY COUMADIN CLINIC (Patient taking differently: TAKE 1 1/2 TABLET BY MOUTH  ON FRIDAYS AT BEDTIME, TAKE 1 TABLET DAILY AT BEDTIME ON ALL OTHER DAYS OR AS DIRECTED BY COUMADIN CLINIC) 35 tablet 3        Family History Family History  Problem Relation Age of Onset  . Cancer Mother     breast  . Cancer Father     colon     Social History Social History   Social History  . Marital Status: Married    Spouse Name: N/A  . Number of Children: N/A  . Years of Education: N/A   Occupational History  . Not on file.   Social History Main Topics  . Smoking status: Never Smoker   . Smokeless tobacco: Not on file  . Alcohol Use: No  . Drug Use: No  . Sexual Activity: Not on file   Other Topics Concern  . Not on file   Social History Narrative     Review of Systems  General:  No chills, fever, night sweats or weight changes.  Cardiovascular:  No chest pain, edema, orthopnea, palpitations, paroxysmal nocturnal dyspnea. + dyspnea on exertion Dermatological: +pruritic rash and blisters on R face and jaw Respiratory: No cough, dyspnea Urologic: No hematuria, dysuria Abdominal:   No nausea, vomiting, diarrhea, bright red blood per rectum, melena, or hematemesis Neurologic:  No visual changes, wkns, changes in mental  status. All other systems reviewed and are otherwise negative except as noted above.  Physical Exam  Blood pressure 137/85, pulse 112, temperature 98.3 F (36.8 C), temperature source Oral, resp. rate 15, SpO2 97 %.  General: Pleasant, NAD Psych: Normal affect. Neuro: Alert and oriented X 3. Moves all extremities spontaneously. HEENT: Normal  Neck: Supple without bruits or JVD. Lungs:  Resp regular and unlabored, CTA. Heart: irregularly irregular. no s3, s4, or murmurs. Abdomen: Soft, non-tender, non-distended, BS + x 4.  Extremities: No clubbing, cyanosis or edema. DP/PT/Radials 2+ and equal bilaterally.  Labs  No results for input(s): CKTOTAL, CKMB, TROPONINI in the last 72 hours. Lab Results  Component Value Date   WBC 9.0 10/10/2015   HGB 17.1* 10/10/2015   HCT 48.3 10/10/2015   MCV 86.3 10/10/2015   PLT 116* 10/10/2015    Recent Labs Lab 10/10/15 1138  NA 135  K 4.0  CL 98*  CO2 25  BUN 11  CREATININE  1.34*  CALCIUM 9.0  GLUCOSE 115*   Lab Results  Component Value Date   CHOL 124 12/10/2014   HDL 53 12/10/2014   LDLCALC 56 12/10/2014   TRIG 74 12/10/2014   No results found for: DDIMER  Radiology/Studies  Dg Chest Portable 1 View  10/10/2015  CLINICAL DATA:  Forty history of heart probable EXAM: PORTABLE CHEST 1 VIEW COMPARISON:  11/07/2014. FINDINGS: 1245 hours. No focal airspace consolidation or pulmonary edema. No pleural effusion. Wedge-shaped opacity over the right upper lung is probably related to clothing or material seen over the soft tissues of the right shoulder. The cardio pericardial silhouette is enlarged. The visualized bony structures of the thorax are intact. Telemetry leads overlie the chest. IMPRESSION: Wedge-shaped opacity over the right apex. Repeat PA and lateral chest x-ray recommended, when the patient is able, without overlying clothing. Electronically Signed   By: Misty Stanley M.D.   On: 10/10/2015 12:55    ECG  afib with  RVR  ASSESSMENT AND PLAN  1. Persistent afib with RVR  - INR subtherapeutic on arrival  - s/p DCCV in Apr 2016, went back into afib by physical exam on office visit in Aug 2016. Moderate dilated LA on last echo 01/13/2015  - CHA2DS2-Vasc score score 5 (HTN, age, CAD, HF)  - continue Toprol XL, titrate IV diltiazem. Potentially increase Toprol XL tomorrow if HR better controlled, given LV dysfunction, will try not to increase PO diltiazem  - no sign of HF on exam, will hold off on repeat echo  2. Possible herpes zoster breakout: per IM  3. mixed ICM/NICM cardiomyopathy with improved EF  - EF 25-30% on echo 10/2014, s/p DES to mid LAD in 10/2014 and DCCV in 12/2014, EF 45-50% on echo 12/2014  4. CAD s/p DES to mid LAD 10/2014  5. HTN 6. HLD  Signed, Almyra Deforest, PA-C 10/10/2015, 3:01 PM  Personally seen and examined. Agree with above.  83 year old with persistent atrial fibrillation, rate control who presents with shingles and shortness of breath with rapid ventricular response.  Atrial fibrillation with rapid ventricular response  - Overall feels better with IV diltiazem  - on 240mg  CD at home.   - However, optimal to use increased Toprol given prior decreased EF.  - Once adequate rate control, consider increase Toprol to 100mg  QD  - No angina.   - No cardioversion - tried 12/2014 and shortly after he was back in AFIB and rate controlled.   CAD  - Able to stop Plavix in Feb. (one year post DES)  - coumadin  Shingles  - per primary team  - exacerbating AFIB  SKAINS, MARK, MD

## 2015-10-10 NOTE — Progress Notes (Signed)
ANTICOAGULATION CONSULT NOTE - Initial Consult  Pharmacy Consult for Warfarin Indication: atrial fibrillation  No Known Allergies  Patient Measurements:  Total Body Weight: 83.6kg  Vital Signs: Temp: 98.3 F (36.8 C) (01/23 1123) Temp Source: Oral (01/23 1123) BP: 159/97 mmHg (01/23 1700) Pulse Rate: 78 (01/23 1700)  Labs:  Recent Labs  10/10/15 1138  HGB 17.1*  HCT 48.3  PLT 116*  LABPROT 21.4*  INR 1.86*  CREATININE 1.34*    CrCl cannot be calculated (Unknown ideal weight.).   Medical History: Past Medical History  Diagnosis Date  . Allergy     hay fever, allergies  . Hyperlipidemia   . Hypertension   . CAD (coronary artery disease)     DES to mid LAD 11/10/2014  . Atrial fibrillation ([place]) 11/07/2014    Medications:   (Not in a hospital admission) Scheduled:  . atorvastatin  40 mg Oral QPC supper  . [START ON 10/11/2015] clopidogrel  75 mg Oral Q breakfast  . diltiazem  240 mg Oral Daily  . [START ON 10/11/2015] lisinopril  5 mg Oral Q breakfast  . metoprolol succinate  50 mg Oral QPC supper  . [START ON 10/11/2015] valACYclovir  1,000 mg Oral BID   Infusions:  . sodium chloride    . 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 Presents w/ fatigue and shortness of breath, generally unwell the past 3-4 days, herpes zoster like rash on R neck and face that itches. Missed dose of BP meds yesterday. Pharmacy consulted to dose warfarin for Afib. BNP 464.5,  sCr 1.34, NCrCl ~42, Hgb 17.1, Plts 116, no s/sx of bleeding. INR 1.86 (subtherapeutic)  PTA warfarin: Takes 1.5 tabs (7.5mg ) po on Friday at bedtime, Takes 1 tab (5mg ) po daily at bedtime all other days. (Last dose on 1/22)  Goal of Therapy:  INR 2-3 Monitor platelets by anticoagulation protocol: Yes   Plan:  Coumadin  7.5mg  po x1 tonight Daily INR  Georgiann Mohs, PharmD Student   I agree with the above assessment and plan.  Nena Jordan, PharmD, BCPS 10/10/2015, 7:19 PM

## 2015-10-11 LAB — COMPREHENSIVE METABOLIC PANEL
ALBUMIN: 3.2 g/dL — AB (ref 3.5–5.0)
ALT: 19 U/L (ref 17–63)
AST: 22 U/L (ref 15–41)
Alkaline Phosphatase: 70 U/L (ref 38–126)
Anion gap: 12 (ref 5–15)
BUN: 13 mg/dL (ref 6–20)
CHLORIDE: 97 mmol/L — AB (ref 101–111)
CO2: 25 mmol/L (ref 22–32)
CREATININE: 1.36 mg/dL — AB (ref 0.61–1.24)
Calcium: 8.6 mg/dL — ABNORMAL LOW (ref 8.9–10.3)
GFR calc Af Amer: 53 mL/min — ABNORMAL LOW (ref >60)
GFR, EST NON AFRICAN AMERICAN: 46 mL/min — AB (ref >60)
GLUCOSE: 115 mg/dL — AB (ref 65–99)
POTASSIUM: 4.1 mmol/L (ref 3.5–5.1)
Sodium: 134 mmol/L — ABNORMAL LOW (ref 135–145)
Total Bilirubin: 2 mg/dL — ABNORMAL HIGH (ref 0.3–1.2)
Total Protein: 6.1 g/dL — ABNORMAL LOW (ref 6.5–8.1)

## 2015-10-11 LAB — CBC WITH DIFFERENTIAL/PLATELET
Basophils Absolute: 0.1 10*3/uL (ref 0.0–0.1)
Basophils Relative: 1 %
EOS ABS: 0.2 10*3/uL (ref 0.0–0.7)
EOS PCT: 3 %
HCT: 46.4 % (ref 39.0–52.0)
Hemoglobin: 15.7 g/dL (ref 13.0–17.0)
LYMPHS ABS: 0.8 10*3/uL (ref 0.7–4.0)
LYMPHS PCT: 11 %
MCH: 28.9 pg (ref 26.0–34.0)
MCHC: 33.8 g/dL (ref 30.0–36.0)
MCV: 85.5 fL (ref 78.0–100.0)
MONO ABS: 0.6 10*3/uL (ref 0.1–1.0)
MONOS PCT: 8 %
Neutro Abs: 5.8 10*3/uL (ref 1.7–7.7)
Neutrophils Relative %: 78 %
PLATELETS: 102 10*3/uL — AB (ref 150–400)
RBC: 5.43 MIL/uL (ref 4.22–5.81)
RDW: 14.8 % (ref 11.5–15.5)
WBC: 7.5 10*3/uL (ref 4.0–10.5)

## 2015-10-11 LAB — MRSA PCR SCREENING: MRSA by PCR: NEGATIVE

## 2015-10-11 LAB — PROTIME-INR
INR: 1.89 — ABNORMAL HIGH (ref 0.00–1.49)
PROTHROMBIN TIME: 21.6 s — AB (ref 11.6–15.2)

## 2015-10-11 MED ORDER — SODIUM CHLORIDE 0.9 % IV SOLN
INTRAVENOUS | Status: DC
  Administered 2015-10-11 (×2): via INTRAVENOUS

## 2015-10-11 MED ORDER — VALACYCLOVIR HCL 500 MG PO TABS
1000.0000 mg | ORAL_TABLET | Freq: Three times a day (TID) | ORAL | Status: DC
  Filled 2015-10-11 (×3): qty 2

## 2015-10-11 MED ORDER — VALACYCLOVIR HCL 500 MG PO TABS
1000.0000 mg | ORAL_TABLET | Freq: Two times a day (BID) | ORAL | Status: DC
  Administered 2015-10-11 – 2015-10-14 (×6): 1000 mg via ORAL
  Filled 2015-10-11 (×8): qty 2

## 2015-10-11 MED ORDER — METOPROLOL SUCCINATE ER 100 MG PO TB24
100.0000 mg | ORAL_TABLET | Freq: Every day | ORAL | Status: DC
  Administered 2015-10-11 – 2015-10-13 (×3): 100 mg via ORAL
  Filled 2015-10-11 (×3): qty 1

## 2015-10-11 MED ORDER — WARFARIN SODIUM 7.5 MG PO TABS
7.5000 mg | ORAL_TABLET | Freq: Once | ORAL | Status: AC
  Administered 2015-10-11: 7.5 mg via ORAL
  Filled 2015-10-11: qty 1

## 2015-10-11 NOTE — Discharge Instructions (Addendum)
Information on my medicine - Coumadin®   (Warfarin) ° °This medication education was reviewed with me or my healthcare representative as part of my discharge preparation.  The pharmacist that spoke with me during my hospital stay was:  Carney, Jessica C, RPH ° °Why was Coumadin prescribed for you? °Coumadin was prescribed for you because you have a blood clot or a medical condition that can cause an increased risk of forming blood clots. Blood clots can cause serious health problems by blocking the flow of blood to the heart, lung, or brain. Coumadin can prevent harmful blood clots from forming. °As a reminder your indication for Coumadin is:   Stroke Prevention Because Of Atrial Fibrillation ° °What test will check on my response to Coumadin? °While on Coumadin (warfarin) you will need to have an INR test regularly to ensure that your dose is keeping you in the desired range. The INR (international normalized ratio) number is calculated from the result of the laboratory test called prothrombin time (PT). ° °If an INR APPOINTMENT HAS NOT ALREADY BEEN MADE FOR YOU please schedule an appointment to have this lab work done by your health care provider within 7 days. °Your INR goal is usually a number between:  2 to 3 or your provider may give you a more narrow range like 2-2.5.  Ask your health care provider during an office visit what your goal INR is. ° °What  do you need to  know  About  COUMADIN? °Take Coumadin (warfarin) exactly as prescribed by your healthcare provider about the same time each day.  DO NOT stop taking without talking to the doctor who prescribed the medication.  Stopping without other blood clot prevention medication to take the place of Coumadin may increase your risk of developing a new clot or stroke.  Get refills before you run out. ° °What do you do if you miss a dose? °If you miss a dose, take it as soon as you remember on the same day then continue your regularly scheduled regimen the next  day.  Do not take two doses of Coumadin at the same time. ° °Important Safety Information °A possible side effect of Coumadin (Warfarin) is an increased risk of bleeding. You should call your healthcare provider right away if you experience any of the following: °? Bleeding from an injury or your nose that does not stop. °? Unusual colored urine (red or dark brown) or unusual colored stools (red or black). °? Unusual bruising for unknown reasons. °? A serious fall or if you hit your head (even if there is no bleeding). ° °Some foods or medicines interact with Coumadin® (warfarin) and might alter your response to warfarin. To help avoid this: °? Eat a balanced diet, maintaining a consistent amount of Vitamin K. °? Notify your provider about major diet changes you plan to make. °? Avoid alcohol or limit your intake to 1 drink for women and 2 drinks for men per day. °(1 drink is 5 oz. wine, 12 oz. beer, or 1.5 oz. liquor.) ° °Make sure that ANY health care provider who prescribes medication for you knows that you are taking Coumadin (warfarin).  Also make sure the healthcare provider who is monitoring your Coumadin knows when you have started a new medication including herbals and non-prescription products. ° °Coumadin® (Warfarin)  Major Drug Interactions  °Increased Warfarin Effect Decreased Warfarin Effect  °Alcohol (large quantities) °Antibiotics (esp. Septra/Bactrim, Flagyl, Cipro) °Amiodarone (Cordarone) °Aspirin (ASA) °Cimetidine (Tagamet) °Megestrol (Megace) °NSAIDs (ibuprofen,   naproxen, etc.) Piroxicam (Feldene) Propafenone (Rythmol SR) Propranolol (Inderal) Isoniazid (INH) Posaconazole (Noxafil) Barbiturates (Phenobarbital) Carbamazepine (Tegretol) Chlordiazepoxide (Librium) Cholestyramine (Questran) Griseofulvin Oral Contraceptives Rifampin Sucralfate (Carafate) Vitamin K   Coumadin (Warfarin) Major Herbal Interactions  Increased Warfarin Effect Decreased Warfarin Effect   Garlic Ginseng Ginkgo biloba Coenzyme Q10 Green tea St. Johns wort    Coumadin (Warfarin) FOOD Interactions  Eat a consistent number of servings per week of foods HIGH in Vitamin K (1 serving =  cup)  Collards (cooked, or boiled & drained) Kale (cooked, or boiled & drained) Mustard greens (cooked, or boiled & drained) Parsley *serving size only =  cup Spinach (cooked, or boiled & drained) Swiss chard (cooked, or boiled & drained) Turnip greens (cooked, or boiled & drained)  Eat a consistent number of servings per week of foods MEDIUM-HIGH in Vitamin K (1 serving = 1 cup)  Asparagus (cooked, or boiled & drained) Broccoli (cooked, boiled & drained, or raw & chopped) Brussel sprouts (cooked, or boiled & drained) *serving size only =  cup Lettuce, raw (green leaf, endive, romaine) Spinach, raw Turnip greens, raw & chopped   These websites have more information on Coumadin (warfarin):  FailFactory.se; VeganReport.com.au;   Atrial Fibrillation Atrial fibrillation is a type of irregular or rapid heartbeat (arrhythmia). In atrial fibrillation, the heart quivers continuously in a chaotic pattern. This occurs when parts of the heart receive disorganized signals that make the heart unable to pump blood normally. This can increase the risk for stroke, heart failure, and other heart-related conditions. There are different types of atrial fibrillation, including:  Paroxysmal atrial fibrillation. This type starts suddenly, and it usually stops on its own shortly after it starts.  Persistent atrial fibrillation. This type often lasts longer than a week. It may stop on its own or with treatment.  Long-lasting persistent atrial fibrillation. This type lasts longer than 12 months.  Permanent atrial fibrillation. This type does not go away. Talk with your health care provider to learn about the type of atrial fibrillation that you have. CAUSES This condition is caused by  some heart-related conditions or procedures, including:  A heart attack.  Coronary artery disease.  Heart failure.  Heart valve conditions.  High blood pressure.  Inflammation of the sac that surrounds the heart (pericarditis).  Heart surgery.  Certain heart rhythm disorders, such as Wolf-Parkinson-White syndrome. Other causes include:  Pneumonia.  Obstructive sleep apnea.  Blockage of an artery in the lungs (pulmonary embolism, or PE).  Lung cancer.  Chronic lung disease.  Thyroid problems, especially if the thyroid is overactive (hyperthyroidism).  Caffeine.  Excessive alcohol use or illegal drug use.  Use of some medicines, including certain decongestants and diet pills. Sometimes, the cause cannot be found. RISK FACTORS This condition is more likely to develop in:  People who are older in age.  People who smoke.  People who have diabetes mellitus.  People who are overweight (obese).  Athletes who exercise vigorously. SYMPTOMS Symptoms of this condition include:  A feeling that your heart is beating rapidly or irregularly.  A feeling of discomfort or pain in your chest.  Shortness of breath.  Sudden light-headedness or weakness.  Getting tired easily during exercise. In some cases, there are no symptoms. DIAGNOSIS Your health care provider may be able to detect atrial fibrillation when taking your pulse. If detected, this condition may be diagnosed with:  An electrocardiogram (ECG).  A Holter monitor test that records your heartbeat patterns over a 24-hour period.  Transthoracic  echocardiogram (TTE) to evaluate how blood flows through your heart.  Transesophageal echocardiogram (TEE) to view more detailed images of your heart.  A stress test.  Imaging tests, such as a CT scan or chest X-ray.  Blood tests. TREATMENT The main goals of treatment are to prevent blood clots from forming and to keep your heart beating at a normal rate and  rhythm. The type of treatment that you receive depends on many factors, such as your underlying medical conditions and how you feel when you are experiencing atrial fibrillation. This condition may be treated with:  Medicine to slow down the heart rate, bring the heart's rhythm back to normal, or prevent clots from forming.  Electrical cardioversion. This is a procedure that resets your heart's rhythm by delivering a controlled, low-energy shock to the heart through your skin.  Different types of ablation, such as catheter ablation, catheter ablation with pacemaker, or surgical ablation. These procedures destroy the heart tissues that send abnormal signals. When the pacemaker is used, it is placed under your skin to help your heart beat in a regular rhythm. HOME CARE INSTRUCTIONS  Take over-the counter and prescription medicines only as told by your health care provider.  If your health care provider prescribed a blood-thinning medicine (anticoagulant), take it exactly as told. Taking too much blood-thinning medicine can cause bleeding. If you do not take enough blood-thinning medicine, you will not have the protection that you need against stroke and other problems.  Do not use tobacco products, including cigarettes, chewing tobacco, and e-cigarettes. If you need help quitting, ask your health care provider.  If you have obstructive sleep apnea, manage your condition as told by your health care provider.  Do not drink alcohol.  Do not drink beverages that contain caffeine, such as coffee, soda, and tea.  Maintain a healthy weight. Do not use diet pills unless your health care provider approves. Diet pills may make heart problems worse.  Follow diet instructions as told by your health care provider.  Exercise regularly as told by your health care provider.  Keep all follow-up visits as told by your health care provider. This is important. PREVENTION  Avoid drinking beverages that  contain caffeine or alcohol.  Avoid certain medicines, especially medicines that are used for breathing problems.  Avoid certain herbs and herbal medicines, such as those that contain ephedra or ginseng.  Do not use illegal drugs, such as cocaine and amphetamines.  Do not smoke.  Manage your high blood pressure. SEEK MEDICAL CARE IF:  You notice a change in the rate, rhythm, or strength of your heartbeat.  You are taking an anticoagulant and you notice increased bruising.  You tire more easily when you exercise or exert yourself. SEEK IMMEDIATE MEDICAL CARE IF:  You have chest pain, abdominal pain, sweating, or weakness.  You feel nauseous.  You notice blood in your vomit, bowel movement, or urine.  You have shortness of breath.  You suddenly have swollen feet and ankles.  You feel dizzy.  You have sudden weakness or numbness of the face, arm, or leg, especially on one side of the body.  You have trouble speaking, trouble understanding, or both (aphasia).  Your face or your eyelid droops on one side. These symptoms may represent a serious problem that is an emergency. Do not wait to see if the symptoms will go away. Get medical help right away. Call your local emergency services (911 in the [place] Do not drive yourself to the hospital.  This information is not intended to replace advice given to you by your health care provider. Make sure you discuss any questions you have with your health care provider. °  °Document Released: 09/03/2005 Document Revised: 05/25/2015 Document Reviewed: 12/29/2014 °Elsevier Interactive Patient Education ©2016 Elsevier Inc. ° °

## 2015-10-11 NOTE — Care Management Note (Signed)
Case Management Note  Patient Details  Name: Ricardo Valencia MRN: [id number] Date of Birth: 11-Sep-1929  Subjective/Objective:   Adm w at fib                Action/Plan: lives w wife, pcp dr burchette   Expected Discharge Date:                  Expected Discharge Plan:     In-House Referral:     Discharge planning Services     Post Acute Care Choice:    Choice offered to:     DME Arranged:    DME Agency:     HH Arranged:    [place] Agency:     Status of Service:     Medicare Important Message Given:    Date Medicare IM Given:    Medicare IM give by:    Date Additional Medicare IM Given:    Additional Medicare Important Message give by:     If discussed at [place] of Stay Meetings, dates discussed:    Additional Comments: ur review done  Lacretia Leigh, RN 10/11/2015, 7:53 AM

## 2015-10-11 NOTE — Progress Notes (Signed)
[place] for Warfarin Indication: atrial fibrillation  No Known Allergies  Patient Measurements: Height: 5\' 9"  (175.3 cm) Weight: 197 lb 8.5 oz (89.6 kg) IBW/kg (Calculated) : 70.7Total Body Weight: 83.6kg  Vital Signs: Temp: 100 F (37.8 C) (01/24 0725) Temp Source: Oral (01/24 0725) BP: 128/71 mmHg (01/24 0800) Pulse Rate: 99 (01/24 0800)  Labs:  Recent Labs  10/10/15 1138 10/11/15 0240  HGB 17.1* 15.7  HCT 48.3 46.4  PLT 116* 102*  LABPROT 21.4* 21.6*  INR 1.86* 1.89*  CREATININE 1.34* 1.36*    Estimated Creatinine Clearance: 44.8 mL/min (by C-G formula based on Cr of 1.36).   Medical History: Past Medical History  Diagnosis Date  . Allergy     hay fever, allergies  . Hyperlipidemia   . Hypertension   . CAD (coronary artery disease)     DES to mid LAD 11/10/2014  . Atrial fibrillation ([place]) 11/07/2014    Medications:   Scheduled:  . sodium chloride   Intravenous STAT  . atorvastatin  40 mg Oral QPC supper  . clopidogrel  75 mg Oral Q breakfast  . lisinopril  5 mg Oral Q breakfast  . metoprolol succinate  50 mg Oral QPC supper  . valACYclovir  1,000 mg Oral BID  . Warfarin - Pharmacist Dosing Inpatient   Does not apply q1800   Infusions:  . 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 Presents w/ fatigue and shortness of breath, generally unwell the past 3-4 days, herpes zoster like rash on R neck and face that itches. Missed dose of BP meds yesterday. Pharmacy consulted to dose warfarin for Afib. BNP 464.5,  sCr 1.34, NCrCl ~42, Hgb 17.1, Plts 116, no s/sx of bleeding.  Today's INR remains slightly subtherapeutic.  No bleeding or complications noted.  PTA warfarin: Takes 1.5 tabs (7.5mg ) po on Friday at bedtime, Takes 1 tab (5mg )  po daily at bedtime all other days. (Last dose on 1/22)  Goal of Therapy:  INR 2-3 Monitor platelets by anticoagulation protocol: Yes   Plan:  Repeat Coumadin 7.5mg  po x1 tonight Daily INR  Uvaldo Rising, BCPS  Clinical Pharmacist Pager [phone]  10/11/2015 8:24 AM

## 2015-10-11 NOTE — Progress Notes (Signed)
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 He was admitted  in February 2016 with A. fib with RVR. Echocardiogram obtained during the admission showed EF 25-30% with moderate MR. He underwent cardiac catheterization on 11/10/2014 which showed severe mid LAD stenosis treated with 2.75 x 16 mm Xience DES, moderate disease in RCA. He was initially placed on eliquis and Plavix, eliquis was later changed to Coumadin. He came back and underwent DC cardioversion in April 2016. Repeat echocardiogram in April showed EF has went back to 45-50%, no significant MR noted. He was last seen by Dr. Angelena Form August 2016, at which time he was back in afib by physical exam but otherwise doing well from cardiology standpoint.  Since last Thursday, he started having rash across the right jaw, right lips, and the right shoulder. They formed blisters and several popped. The blister appears to be pruritic but no pain. He lives with his wife, however his wife did not have similar symptom.   On the same day, he started noticing shortness breath during activity. It eventually worsened to the degree he decided to seek medical attention at [facility] on 10/10/2015. On arrival he was noted to be in atrial fibrillation with RVR. INR was subtherapeutic at 1.8.  Cardiology has been consulted for A. fib with RVR. Initial chest x-ray showed a wedge shaped opacity in the right upper lobe, however repeat PA lateral x-ray did not mention any opacity.  Assessment and plan Atrial fibrillation with rapid ventricular response, initiated on  Cardizem drip on admission Status post cardioversion in April 2016, Patient on Plavix and Coumadin, also on Cardizem 240 mg a day, metoprolol 50 mg a day Cardiology recommends to hold off on repeat echo Taper off Cardizem drip and adjust by mouth medications [start PO Cardizem] No cardioversion - tried 12/2014 and shortly after he was back in AFIB and rate controlled. Start IVF as it may help HR in the setting of fever   Coronary artery disease Continue Coumadin, patient can start Plavix in February 2017  Shingles-currently on Valtrex thousand milligrams PO TID, adjust based on his renal function?  Chronic kidney disease, stage 2, creatinine at baseline  Ischemic cardiomyopathy-without exacerbation, last EF 45-50% of 01/13/15, continue beta blocker, ACE inhibitor   DVT prophylaxsis Coumadin   Code Status:      Code Status Orders        Start     Ordered   10/10/15 1753  Full code   Continuous     10/10/15 1752       Family Communication: Discussed in detail with the patient, all imaging results, lab results explained to the patient   Disposition Plan:  As above       Consultants:  Cardiology   Procedures:  None   Antibiotics: Anti-infectives    Start     Dose/Rate Route Frequency Ordered Stop   10/11/15 1000  valACYclovir (VALTREX) tablet 1,000 mg     1,000 mg Oral 2 times daily 10/10/15 1751     10/10/15 1600  valACYclovir (VALTREX) tablet 1,000 mg  1,000 mg Oral  Once 10/10/15 1547 10/10/15 1652         HPI/Subjective: Hr still up in the 110 range, some right facial pain  Objective: Filed Vitals:   10/11/15 0400 10/11/15 0423 10/11/15 0725 10/11/15 0800  BP: 126/65  125/77 128/71  Pulse: 94  98 99  Temp:  99.8 F (37.7 C) 100 F (37.8 C)   TempSrc:  Oral Oral   Resp: 20  27 24   Height:      Weight:      SpO2: 97%  94% 95%    Intake/Output Summary (Last 24 hours) at 10/11/15 0850 Last data filed at 10/11/15 0800  Gross per 24 hour   Intake 164.25 ml  Output      0 ml  Net 164.25 ml    Exam:  General: Pleasant, NAD Psych: Normal affect. Neuro: Alert and oriented X 3. Moves all extremities spontaneously. HEENT:rash right face  Neck: Supple without bruits or JVD. Lungs: Resp regular and unlabored, CTA. Heart: irregularly irregular. no s3, s4, or murmurs. Abdomen: Soft, non-tender, non-distended, BS + x 4.  Extremities: No clubbing, cyanosis or edema. DP/PT/Radials 2+ and equal bilaterally.  Data Review   Micro Results Recent Results (from the past 240 hour(s))  MRSA PCR Screening     Status: None   Collection Time: 10/10/15  9:25 PM  Result Value Ref Range Status   MRSA by PCR NEGATIVE NEGATIVE Final    Comment:        The GeneXpert MRSA Assay (FDA approved for NASAL specimens only), is one component of a comprehensive MRSA colonization surveillance program. It is not intended to diagnose MRSA infection nor to guide or monitor treatment for MRSA infections.     Radiology Reports Dg Chest 2 View  10/10/2015  CLINICAL DATA:  Shortness of breath for 2 days EXAM: CHEST  2 VIEW COMPARISON:  October 10, 2015 FINDINGS: There is patchy left base atelectasis. There is no edema or consolidation. Heart is upper normal in size with pulmonary vascularity within normal limits. No adenopathy. No bone lesions. IMPRESSION: Left base atelectasis.  No frank edema or consolidation. Electronically Signed   By: Lowella Grip III M.D.   On: 10/10/2015 15:16   Dg Chest Portable 1 View  10/10/2015  CLINICAL DATA:  Forty history of heart probable EXAM: PORTABLE CHEST 1 VIEW COMPARISON:  11/07/2014. FINDINGS: 1245 hours. No focal airspace consolidation or pulmonary edema. No pleural effusion. Wedge-shaped opacity over the right upper lung is probably related to clothing or material seen over the soft tissues of the right shoulder. The cardio pericardial silhouette is enlarged. The visualized bony structures of the  thorax are intact. Telemetry leads overlie the chest. IMPRESSION: Wedge-shaped opacity over the right apex. Repeat PA and lateral chest x-ray recommended, when the patient is able, without overlying clothing. Electronically Signed   By: Misty Stanley M.D.   On: 10/10/2015 12:55     CBC  Recent Labs Lab 10/10/15 1138 10/11/15 0240  WBC 9.0 7.5  HGB 17.1* 15.7  HCT 48.3 46.4  PLT 116* 102*  MCV 86.3 85.5  MCH 30.5 28.9  MCHC 35.4 33.8  RDW 14.6 14.8  LYMPHSABS  --  0.8  MONOABS  --  0.6  EOSABS  --  0.2  BASOSABS  --  0.1    Chemistries   Recent Labs Lab 10/10/15 1138 10/11/15 0240  NA 135 134*  K 4.0 4.1  CL 98* 97*  CO2 25 25  GLUCOSE 115* 115*  BUN 11 13  CREATININE 1.34* 1.36*  CALCIUM 9.0 8.6*  AST  --  22  ALT  --  19  ALKPHOS  --  70  BILITOT  --  2.0*   ------------------------------------------------------------------------------------------------------------------ estimated creatinine clearance is 44.8 mL/min (by C-G formula based on Cr of 1.36). ------------------------------------------------------------------------------------------------------------------ No results for input(s): HGBA1C in the last 72 hours. ------------------------------------------------------------------------------------------------------------------ No results for input(s): CHOL, HDL, LDLCALC, TRIG, CHOLHDL, LDLDIRECT in the last 72 hours. ------------------------------------------------------------------------------------------------------------------ No results for input(s): TSH, T4TOTAL, T3FREE, THYROIDAB in the last 72 hours.  Invalid input(s): FREET3 ------------------------------------------------------------------------------------------------------------------ No results for input(s): VITAMINB12, FOLATE, FERRITIN, TIBC, IRON, RETICCTPCT in the last 72 hours.  Coagulation profile  Recent Labs Lab 10/10/15 1138 10/11/15 0240  INR 1.86* 1.89*    No results for  input(s): DDIMER in the last 72 hours.  Cardiac Enzymes No results for input(s): CKMB, TROPONINI, MYOGLOBIN in the last 168 hours.  Invalid input(s): CK ------------------------------------------------------------------------------------------------------------------ Invalid input(s): POCBNP   CBG: No results for input(s): GLUCAP in the last 168 hours.     Studies: Dg Chest 2 View  10/10/2015  CLINICAL DATA:  Shortness of breath for 2 days EXAM: CHEST  2 VIEW COMPARISON:  October 10, 2015 FINDINGS: There is patchy left base atelectasis. There is no edema or consolidation. Heart is upper normal in size with pulmonary vascularity within normal limits. No adenopathy. No bone lesions. IMPRESSION: Left base atelectasis.  No frank edema or consolidation. Electronically Signed   By: Lowella Grip III M.D.   On: 10/10/2015 15:16   Dg Chest Portable 1 View  10/10/2015  CLINICAL DATA:  Forty history of heart probable EXAM: PORTABLE CHEST 1 VIEW COMPARISON:  11/07/2014. FINDINGS: 1245 hours. No focal airspace consolidation or pulmonary edema. No pleural effusion. Wedge-shaped opacity over the right upper lung is probably related to clothing or material seen over the soft tissues of the right shoulder. The cardio pericardial silhouette is enlarged. The visualized bony structures of the thorax are intact. Telemetry leads overlie the chest. IMPRESSION: Wedge-shaped opacity over the right apex. Repeat PA and lateral chest x-ray recommended, when the patient is able, without overlying clothing. Electronically Signed   By: Misty Stanley M.D.   On: 10/10/2015 12:55      Lab Results  Component Value Date   HGBA1C 5.4 11/07/2014   Lab Results  Component Value Date   LDLCALC 56 12/10/2014   CREATININE 1.36* 10/11/2015       Scheduled Meds: . sodium chloride   Intravenous STAT  . atorvastatin  40 mg Oral QPC supper  . clopidogrel  75 mg Oral Q breakfast  . lisinopril  5 mg Oral Q breakfast   . metoprolol succinate  50 mg Oral QPC supper  . valACYclovir  1,000 mg Oral BID  . warfarin  7.5 mg Oral ONCE-1800  . Warfarin - Pharmacist Dosing Inpatient   Does not apply q1800   Continuous Infusions: . diltiazem (CARDIZEM) infusion 14 mg/hr (10/11/15 0617)    Active Problems:   Atrial fibrillation ([place])   A-fib ([facility])    Time spent: 76 minutes   [facility] Pager [phone]. If 7PM-7AM, please contact night-coverage at [email or web address], password [email or web address] [facility] 10/11/2015, 8:50 AM  LOS: 1 day

## 2015-10-11 NOTE — Progress Notes (Signed)
ANTIBIOTIC CONSULT NOTE - INITIAL  Pharmacy Consult for valacyclovir (Valtrex) Indication: herpes zoster  No Known Allergies  Patient Measurements: Height: 5\' 9"  (175.3 cm) Weight: 197 lb 8.5 oz (89.6 kg) IBW/kg (Calculated) : 70.7 Adjusted Body Weight: n/a    Recent Labs  10/10/15 1138 10/11/15 0240  WBC 9.0 7.5  HGB 17.1* 15.7  PLT 116* 102*  CREATININE 1.34* 1.36*   Estimated Creatinine Clearance: 44.8 mL/min (by C-G formula based on Cr of 1.36). No results for input(s): VANCOTROUGH, VANCOPEAK, VANCORANDOM, GENTTROUGH, GENTPEAK, GENTRANDOM, TOBRATROUGH, TOBRAPEAK, TOBRARND, AMIKACINPEAK, AMIKACINTROU, AMIKACIN in the last 72 hours.    Assessment: 82 yo male with rash suspicious for herpes zoster.  Pharmacy asked to assist with valacyclovir dosing.  Estimated CrCl ~ 44 ml/min.    Goal of Therapy:  Resolution of infection  Plan:  1. Decrease valacyclovir to 1g BID due to renal function.  Usual course of therapy 7 days.  Uvaldo Rising, BCPS  Clinical Pharmacist Pager [phone]  10/11/2015 9:24 AM

## 2015-10-11 NOTE — Progress Notes (Addendum)
Cardiologist: Dr. Angelena Form  Subjective:  Feels better. Less SOB.   Objective:  Vital Signs in the last 24 hours: Temp:  [98.4 F (36.9 C)-100.8 F (38.2 C)] 100 F (37.8 C) (01/24 0725) Pulse Rate:  [78-129] 99 (01/24 0800) Resp:  [15-27] 24 (01/24 0800) BP: (104-159)/(65-111) 128/71 mmHg (01/24 0800) SpO2:  [92 %-100 %] 95 % (01/24 0800) Weight:  [197 lb 8.5 oz (89.6 kg)] 197 lb 8.5 oz (89.6 kg) (01/23 2100)  Intake/Output from previous day: 01/23 0701 - 01/24 0700 In: 164.3 [I.V.:164.3] Out: -    Physical Exam: General: Well developed, well nourished, in no acute distress. Head:  Normocephalic and atraumatic. Zoster noted right neck Lungs: Clear to auscultation and percussion. Heart: Irreg irreg.  No murmur, rubs or gallops.  Abdomen: soft, non-tender, positive bowel sounds. Extremities: No clubbing or cyanosis. No edema. Neurologic: Alert and oriented x 3.    Lab Results:  Recent Labs  10/10/15 1138 10/11/15 0240  WBC 9.0 7.5  HGB 17.1* 15.7  PLT 116* 102*    Recent Labs  10/10/15 1138 10/11/15 0240  NA 135 134*  K 4.0 4.1  CL 98* 97*  CO2 25 25  GLUCOSE 115* 115*  BUN 11 13  CREATININE 1.34* 1.36*   No results for input(s): TROPONINI in the last 72 hours.  Invalid input(s): CK, MB Hepatic Function Panel  Recent Labs  10/11/15 0240  PROT 6.1*  ALBUMIN 3.2*  AST 22  ALT 19  ALKPHOS 70  BILITOT 2.0*   No results for input(s): CHOL in the last 72 hours. No results for input(s): PROTIME in the last 72 hours.  Imaging: Dg Chest 2 View  10/10/2015  CLINICAL DATA:  Shortness of breath for 2 days EXAM: CHEST  2 VIEW COMPARISON:  October 10, 2015 FINDINGS: There is patchy left base atelectasis. There is no edema or consolidation. Heart is upper normal in size with pulmonary vascularity within normal limits. No adenopathy. No bone lesions. IMPRESSION: Left base atelectasis.  No frank edema or consolidation. Electronically Signed   By: Lowella Grip III M.D.   On: 10/10/2015 15:16   Dg Chest Portable 1 View  10/10/2015  CLINICAL DATA:  Forty history of heart probable EXAM: PORTABLE CHEST 1 VIEW COMPARISON:  11/07/2014. FINDINGS: 1245 hours. No focal airspace consolidation or pulmonary edema. No pleural effusion. Wedge-shaped opacity over the right upper lung is probably related to clothing or material seen over the soft tissues of the right shoulder. The cardio pericardial silhouette is enlarged. The visualized bony structures of the thorax are intact. Telemetry leads overlie the chest. IMPRESSION: Wedge-shaped opacity over the right apex. Repeat PA and lateral chest x-ray recommended, when the patient is able, without overlying clothing. Electronically Signed   By: Misty Stanley M.D.   On: 10/10/2015 12:55   Personally viewed.   Telemetry: AFIB 90-120 Personally viewed.   EKG:  AFIB Personally viewed.  Cardiac Studies:  ECHO 4/16 - EF 45-50%  Meds: Scheduled Meds: . sodium chloride   Intravenous STAT  . atorvastatin  40 mg Oral QPC supper  . clopidogrel  75 mg Oral Q breakfast  . lisinopril  5 mg Oral Q breakfast  . metoprolol succinate  50 mg Oral QPC supper  . valACYclovir  1,000 mg Oral BID  . warfarin  7.5 mg Oral ONCE-1800  . Warfarin - Pharmacist Dosing Inpatient   Does not apply q1800   Continuous Infusions: . sodium chloride    .  diltiazem (CARDIZEM) infusion 14 mg/hr (10/11/15 0617)   PRN Meds:.acetaminophen, morphine injection  Assessment/Plan:  Active Problems:   Atrial fibrillation ([facility])   A-fib ([place])  82 year old with Zoster, AFIB persistent with RVR, prior SOB.   AFIB with RVR  - persistent  - previously failed cardioversion  - Heart rate increased because of concomitant viral infection  - I will increase Toprol to 100 mg once a day.   - Hopefully be able to wean off diltiazem. Discussed with nursing  - It makes sense for him to utilize Toprol given his prior cardiomyopathy.  - This patients  CHA2DS2-VASc Score and unadjusted Ischemic Stroke Rate (% per year) is equal to 4.8 % stroke rate/year from a score of 4  Above score calculated as 1 point each if present [CHF, HTN, DM, Vascular=MI/PAD/Aortic Plaque, Age if 65-74, or Male] Above score calculated as 2 points each if present [Age > 75, or Stroke/TIA/TE]    CAD  - DES to mid LAD 10/2014  - We will be able to stop Plavix in February 2016  - Continue Plavix with Coumadin  Ischemic cardiomyopathy  - EF improved post PCI.  - EF was as low as 25%, most recent 45%  - Continue low-dose lisinopril  - Increasing Toprol  Herpes zoster/singles  -Valacyclovir  - Pain management per primary team  Dyspnea  - Improved  We will follow along.      SKAINS, [place] 10/11/2015, 11:40 AM

## 2015-10-12 DIAGNOSIS — I480 Paroxysmal atrial fibrillation: Secondary | ICD-10-CM

## 2015-10-12 DIAGNOSIS — I255 Ischemic cardiomyopathy: Secondary | ICD-10-CM

## 2015-10-12 DIAGNOSIS — I4891 Unspecified atrial fibrillation: Secondary | ICD-10-CM

## 2015-10-12 LAB — COMPREHENSIVE METABOLIC PANEL
ALBUMIN: 3 g/dL — AB (ref 3.5–5.0)
ALT: 19 U/L (ref 17–63)
AST: 23 U/L (ref 15–41)
Alkaline Phosphatase: 64 U/L (ref 38–126)
Anion gap: 16 — ABNORMAL HIGH (ref 5–15)
BUN: 12 mg/dL (ref 6–20)
CHLORIDE: 94 mmol/L — AB (ref 101–111)
CO2: 21 mmol/L — AB (ref 22–32)
CREATININE: 1.08 mg/dL (ref 0.61–1.24)
Calcium: 7.4 mg/dL — ABNORMAL LOW (ref 8.9–10.3)
GFR calc Af Amer: >60 mL/min (ref >60)
GLUCOSE: 115 mg/dL — AB (ref 65–99)
POTASSIUM: 3.7 mmol/L (ref 3.5–5.1)
SODIUM: 131 mmol/L — AB (ref 135–145)
Total Bilirubin: 2.1 mg/dL — ABNORMAL HIGH (ref 0.3–1.2)
Total Protein: 5.8 g/dL — ABNORMAL LOW (ref 6.5–8.1)

## 2015-10-12 LAB — CBC
HCT: 44.1 % (ref 39.0–52.0)
Hemoglobin: 15.9 g/dL (ref 13.0–17.0)
MCH: 31.4 pg (ref 26.0–34.0)
MCHC: 36.1 g/dL — ABNORMAL HIGH (ref 30.0–36.0)
MCV: 87 fL (ref 78.0–100.0)
PLATELETS: 105 10*3/uL — AB (ref 150–400)
RBC: 5.07 MIL/uL (ref 4.22–5.81)
RDW: 15 % (ref 11.5–15.5)
WBC: 10 10*3/uL (ref 4.0–10.5)

## 2015-10-12 LAB — PROTIME-INR
INR: 2.19 — AB (ref 0.00–1.49)
Prothrombin Time: 24.2 seconds — ABNORMAL HIGH (ref 11.6–15.2)

## 2015-10-12 MED ORDER — WARFARIN SODIUM 7.5 MG PO TABS: 7.5000 mg | ORAL_TABLET | ORAL | Status: DC

## 2015-10-12 MED ORDER — GUAIFENESIN ER 600 MG PO TB12
1200.0000 mg | ORAL_TABLET | Freq: Two times a day (BID) | ORAL | Status: DC
  Administered 2015-10-12 – 2015-10-14 (×6): 1200 mg via ORAL
  Filled 2015-10-12 (×6): qty 2

## 2015-10-12 MED ORDER — GUAIFENESIN ER 600 MG PO TB12: 1200.0000 mg | ORAL_TABLET | Freq: Two times a day (BID) | ORAL | Status: DC

## 2015-10-12 MED ORDER — MENTHOL 3 MG MT LOZG: 1.0000 | LOZENGE | OROMUCOSAL | Status: DC | PRN

## 2015-10-12 MED ORDER — HYDROCOD POLST-CPM POLST ER 10-8 MG/5ML PO SUER
5.0000 mL | Freq: Once | ORAL | Status: AC
  Administered 2015-10-12: 5 mL via ORAL
  Filled 2015-10-12: qty 5

## 2015-10-12 MED ORDER — WARFARIN SODIUM 5 MG PO TABS
5.0000 mg | ORAL_TABLET | ORAL | Status: DC
  Administered 2015-10-12 – 2015-10-13 (×2): 5 mg via ORAL
  Filled 2015-10-12 (×2): qty 1

## 2015-10-12 NOTE — Progress Notes (Signed)
Cardiologist: Dr. Angelena Form  Subjective:  Feels better. No SOB. Rash.   Objective:  Vital Signs in the last 24 hours: Temp:  [98.2 F (36.8 C)-99.5 F (37.5 C)] 98.4 F (36.9 C) (01/25 0807) Pulse Rate:  [17-99] 91 (01/25 0807) Resp:  [16-31] 17 (01/25 0807) BP: (106-146)/(51-98) 125/63 mmHg (01/25 0807) SpO2:  [91 %-100 %] 97 % (01/25 0807)  Intake/Output from previous day: 01/24 0701 - 01/25 0700 In: 2398.2 [P.O.:830; I.V.:1568.2] Out: 700 [Urine:700]   Physical Exam: General: Well developed, well nourished, in no acute distress. Head:  Normocephalic and atraumatic. Zoster noted right neck Lungs: Clear to auscultation and percussion. Heart: Irreg irreg.  No murmur, rubs or gallops.  Abdomen: soft, non-tender, positive bowel sounds. Extremities: No clubbing or cyanosis. No edema. Neurologic: Alert and oriented x 3.    Lab Results:  Recent Labs  10/11/15 0240 10/12/15 0433  WBC 7.5 10.0  HGB 15.7 15.9  PLT 102* 105*    Recent Labs  10/11/15 0240 10/12/15 0433  NA 134* 131*  K 4.1 3.7  CL 97* 94*  CO2 25 21*  GLUCOSE 115* 115*  BUN 13 12  CREATININE 1.36* 1.08   No results for input(s): TROPONINI in the last 72 hours.  Invalid input(s): CK, MB Hepatic Function Panel  Recent Labs  10/12/15 0433  PROT 5.8*  ALBUMIN 3.0*  AST 23  ALT 19  ALKPHOS 64  BILITOT 2.1*   No results for input(s): CHOL in the last 72 hours. No results for input(s): PROTIME in the last 72 hours.  Imaging: Dg Chest 2 View  10/10/2015  CLINICAL DATA:  Shortness of breath for 2 days EXAM: CHEST  2 VIEW COMPARISON:  October 10, 2015 FINDINGS: There is patchy left base atelectasis. There is no edema or consolidation. Heart is upper normal in size with pulmonary vascularity within normal limits. No adenopathy. No bone lesions. IMPRESSION: Left base atelectasis.  No frank edema or consolidation. Electronically Signed   By: Lowella Grip III M.D.   On: 10/10/2015 15:16    Dg Chest Portable 1 View  10/10/2015  CLINICAL DATA:  Forty history of heart probable EXAM: PORTABLE CHEST 1 VIEW COMPARISON:  11/07/2014. FINDINGS: 1245 hours. No focal airspace consolidation or pulmonary edema. No pleural effusion. Wedge-shaped opacity over the right upper lung is probably related to clothing or material seen over the soft tissues of the right shoulder. The cardio pericardial silhouette is enlarged. The visualized bony structures of the thorax are intact. Telemetry leads overlie the chest. IMPRESSION: Wedge-shaped opacity over the right apex. Repeat PA and lateral chest x-ray recommended, when the patient is able, without overlying clothing. Electronically Signed   By: Misty Stanley M.D.   On: 10/10/2015 12:55   Personally viewed.   Telemetry: AFIB 80-90, with brief NSR with first degree AVB. Personally viewed.   EKG:  AFIB Personally viewed.  Cardiac Studies:  ECHO 4/16 - EF 45-50%  Meds: Scheduled Meds: . atorvastatin  40 mg Oral QPC supper  . clopidogrel  75 mg Oral Q breakfast  . guaiFENesin  1,200 mg Oral BID  . lisinopril  5 mg Oral Q breakfast  . metoprolol succinate  100 mg Oral QPC supper  . valACYclovir  1,000 mg Oral BID  . warfarin  5 mg Oral Once per day on Sun Mon Tue Wed Thu Sat  . [START ON 10/14/2015] warfarin  7.5 mg Oral Once per day on Fri  . Warfarin - Pharmacist Dosing Inpatient  Does not apply q1800   Continuous Infusions:   PRN Meds:.acetaminophen, morphine injection  Assessment/Plan:  Active Problems:   Atrial fibrillation ([facility])   A-fib ([place])  82 year old with Zoster, AFIB persistent with RVR, prior SOB.   AFIB with RVR  - parox  - previously failed cardioversion  - Heart rate increased because of concomitant viral infection  - Increased Toprol to 100 mg once a day.   - Off diltiazem.  - It makes sense for him to utilize Toprol given his prior cardiomyopathy.  - This patients CHA2DS2-VASc Score and unadjusted Ischemic Stroke  Rate (% per year) is equal to 4.8 % stroke rate/year from a score of 4  Above score calculated as 1 point each if present [CHF, HTN, DM, Vascular=MI/PAD/Aortic Plaque, Age if 65-74, or Male] Above score calculated as 2 points each if present [Age > 75, or Stroke/TIA/TE]    CAD  - DES to mid LAD 10/2014  - We will be able to stop Plavix in February 2016  - Continue Plavix with Coumadin  Ischemic cardiomyopathy  - EF improved post PCI.  - EF was as low as 25%, most recent 45%  - Continue low-dose lisinopril  - Toprol  Herpes zoster/singles  -Valacyclovir  - Pain management per primary team  Dyspnea  - Improved  We will sign off. Please call if ?   Ricardo Valencia, [place] 10/12/2015, 10:37 AM

## 2015-10-12 NOTE — Progress Notes (Signed)
Dr. Sloan Leiter notified of pt confusion, and that family has also noticed that pt is confused, and they are concerned.  Dr. Sloan Leiter stated he will come recheck patient.  Will continue to monitor pt closely.

## 2015-10-12 NOTE — Progress Notes (Signed)
[place] [facility] for Warfarin Indication: atrial fibrillation  No Known Allergies  Patient Measurements: Height: 5\' 9"  (175.3 cm) Weight: 197 lb 8.5 oz (89.6 kg) IBW/kg (Calculated) : 70.7Total Body Weight: 83.6kg  Vital Signs: Temp: 98.4 F (36.9 C) (01/25 0807) Temp Source: Oral (01/25 0807) BP: 125/63 mmHg (01/25 0807) Pulse Rate: 91 (01/25 0807)  Labs:  Recent Labs  10/10/15 1138 10/11/15 0240 10/12/15 0433  HGB 17.1* 15.7 15.9  HCT 48.3 46.4 44.1  PLT 116* 102* 105*  LABPROT 21.4* 21.6* 24.2*  INR 1.86* 1.89* 2.19*  CREATININE 1.34* 1.36* 1.08    Estimated Creatinine Clearance: 56.4 mL/min (by C-G formula based on Cr of 1.08).   Medical History: Past Medical History  Diagnosis Date  . Allergy     hay fever, allergies  . Hyperlipidemia   . Hypertension   . CAD (coronary artery disease)     DES to mid LAD 11/10/2014  . Atrial fibrillation ([place]) 11/07/2014    Medications:   Scheduled:  . atorvastatin  40 mg Oral QPC supper  . clopidogrel  75 mg Oral Q breakfast  . guaiFENesin  1,200 mg Oral BID  . lisinopril  5 mg Oral Q breakfast  . metoprolol succinate  100 mg Oral QPC supper  . valACYclovir  1,000 mg Oral BID  . warfarin  5 mg Oral Once per day on Sun Mon Tue Wed Thu Sat  . [START ON 10/14/2015] warfarin  7.5 mg Oral Once per day on Fri  . Warfarin - Pharmacist Dosing Inpatient   Does not apply q1800   Infusions:  . 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 Presents w/ fatigue and shortness of breath, generally unwell the past 3-4 days, herpes zoster like rash on R neck and face that itches. Missed dose of BP meds yesterday. Pharmacy consulted to dose warfarin for Afib. BNP 464.5,  sCr 1.34, NCrCl ~42, Hgb 17.1, Plts 116, no  s/sx of bleeding.  Today's INR 2.19 at goal after 2 doses warfarin 7.5mg .  No bleeding or complications noted.  PTA warfarin: Takes 1.5 tabs (7.5mg ) po on Friday at bedtime, Takes 1 tab (5mg ) po daily at bedtime all other days.  Goal of Therapy:  INR 2-3 Monitor platelets by anticoagulation protocol: Yes   Plan:  Restart home dose warfarin 5mg  daily except 7.5mg  on Fri Daily INR  Bonnita Nasuti Pharm.D. CPP, BCPS Clinical Pharmacist [phone] 10/12/2015 10:05 AM

## 2015-10-12 NOTE — Progress Notes (Signed)
PATIENT DETAILS Name: Ricardo Valencia Age: 82 y.o. Sex: male Date of Birth: 1929/06/15 Admit Date: 10/10/2015 Admitting Physician Gennaro Africa, MD MJ:[id number] W, MD  Subjective: Feels better-de-roofing of vesicles in the right side of the face. Some are crusted.  Assessment/Plan: Active Problems: A. fib RVR: Continue to titrate off Cardizem infusion, continue Toprol.CHA2DS2-VASc 4. INR slightly subtherapeutic, pharmacy managing. Cardiology continues to follow.  Herpes zoster: Mostly involving the right side of his face. Some vesicles/crusting on the auricle as well. However no evidence of facial paralysis at this point. Continue Valtrex. Maintain on airborne isolation until lesions are crusted.  Hyponatremia: Mild, stop IV fluids. Euvolemic and exam. Follow.  History of CAD: Without chest pain or shortness of breath. History of PCI with DES to mid LAD 10/2014. Per cardiology-continue Plavix and Coumadin for now, okay to stop Plavix in February 2016.  Chronic systolic heart failure: Likely ischemic cardiomyopathy, EF has improved post-PCI. Clinically euvolemic. Continue lisinopril and Toprol. Follow.  Dyslipidemia: Continue statin.  Disposition: Remain inpatient-transfer to telemetry went off Cardizem infusion  Antimicrobial agents  See below  Anti-infectives    Start     Dose/Rate Route Frequency Ordered Stop   10/11/15 2200  valACYclovir (VALTREX) tablet 1,000 mg     1,000 mg Oral 2 times daily 10/11/15 0925     10/11/15 1000  valACYclovir (VALTREX) tablet 1,000 mg  Status:  Discontinued     1,000 mg Oral 2 times daily 10/10/15 1751 10/11/15 0903   10/11/15 1000  valACYclovir (VALTREX) tablet 1,000 mg  Status:  Discontinued     1,000 mg Oral 3 times daily 10/11/15 0903 10/11/15 0925   10/10/15 1600  valACYclovir (VALTREX) tablet 1,000 mg     1,000 mg Oral  Once 10/10/15 1547 10/10/15 1652      DVT Prophylaxis: Coumadin  Code Status: Full  code   Family Communication None at bedside  Procedures: None  CONSULTS:  cardiology  Time spent 30 minutes-Greater than 50% of this time was spent in counseling, explanation of diagnosis, planning of further management, and coordination of care.  MEDICATIONS: Scheduled Meds: . atorvastatin  40 mg Oral QPC supper  . clopidogrel  75 mg Oral Q breakfast  . guaiFENesin  1,200 mg Oral BID  . lisinopril  5 mg Oral Q breakfast  . metoprolol succinate  100 mg Oral QPC supper  . valACYclovir  1,000 mg Oral BID  . warfarin  5 mg Oral Once per day on Sun Mon Tue Wed Thu Sat  . [START ON 10/14/2015] warfarin  7.5 mg Oral Once per day on Fri  . Warfarin - Pharmacist Dosing Inpatient   Does not apply q1800   Continuous Infusions:  PRN Meds:.acetaminophen, morphine injection    PHYSICAL EXAM: Vital signs in last 24 hours: Filed Vitals:   10/12/15 0655 10/12/15 0700 10/12/15 0800 10/12/15 0807  BP: 135/82   125/63  Pulse: 78 78 88 91  Temp:    98.4 F (36.9 C)  TempSrc:    Oral  Resp: 23  25 17   Height:      Weight:      SpO2: 98%  94% 97%    Weight change:  Filed Weights   10/10/15 2100  Weight: 89.6 kg (197 lb 8.5 oz)   Body mass index is 29.16 kg/(m^2).   Gen Exam: Awake and alert with clear speech.   Neck: Supple, No JVD.  Chest: B/L Clear.   CVS: S1 S2 Regular, no murmurs.  Abdomen: soft, BS +, non tender, non distended.  Extremities: no edema, lower extremities warm to touch. Neurologic: Non Focal.   Skin: Vesicular rash-with a lot of de-roofing-some crusting seen on the right side of the face including the auricles.  Intake/Output from previous day:  Intake/Output Summary (Last 24 hours) at 10/12/15 1059 Last data filed at 10/12/15 1000  Gross per 24 hour  Intake 2676.2 ml  Output    500 ml  Net 2176.2 ml     LAB RESULTS: CBC  Recent Labs Lab 10/10/15 1138 10/11/15 0240 10/12/15 0433  WBC 9.0 7.5 10.0  HGB 17.1* 15.7 15.9  HCT 48.3 46.4 44.1    PLT 116* 102* 105*  MCV 86.3 85.5 87.0  MCH 30.5 28.9 31.4  MCHC 35.4 33.8 36.1*  RDW 14.6 14.8 15.0  LYMPHSABS  --  0.8  --   MONOABS  --  0.6  --   EOSABS  --  0.2  --   BASOSABS  --  0.1  --     Chemistries   Recent Labs Lab 10/10/15 1138 10/11/15 0240 10/12/15 0433  NA 135 134* 131*  K 4.0 4.1 3.7  CL 98* 97* 94*  CO2 25 25 21*  GLUCOSE 115* 115* 115*  BUN 11 13 12   CREATININE 1.34* 1.36* 1.08  CALCIUM 9.0 8.6* 7.4*    CBG: No results for input(s): GLUCAP in the last 168 hours.  GFR Estimated Creatinine Clearance: 56.4 mL/min (by C-G formula based on Cr of 1.08).  Coagulation profile  Recent Labs Lab 10/10/15 1138 10/11/15 0240 10/12/15 0433  INR 1.86* 1.89* 2.19*    Cardiac Enzymes No results for input(s): CKMB, TROPONINI, MYOGLOBIN in the last 168 hours.  Invalid input(s): CK  Invalid input(s): POCBNP No results for input(s): DDIMER in the last 72 hours. No results for input(s): HGBA1C in the last 72 hours. No results for input(s): CHOL, HDL, LDLCALC, TRIG, CHOLHDL, LDLDIRECT in the last 72 hours. No results for input(s): TSH, T4TOTAL, T3FREE, THYROIDAB in the last 72 hours.  Invalid input(s): FREET3 No results for input(s): VITAMINB12, FOLATE, FERRITIN, TIBC, IRON, RETICCTPCT in the last 72 hours. No results for input(s): LIPASE, AMYLASE in the last 72 hours.  Urine Studies No results for input(s): UHGB, CRYS in the last 72 hours.  Invalid input(s): UACOL, UAPR, USPG, UPH, UTP, UGL, UKET, UBIL, UNIT, UROB, ULEU, UEPI, UWBC, URBC, UBAC, CAST, UCOM, BILUA  MICROBIOLOGY: Recent Results (from the past 240 hour(s))  MRSA PCR Screening     Status: None   Collection Time: 10/10/15  9:25 PM  Result Value Ref Range Status   MRSA by PCR NEGATIVE NEGATIVE Final    Comment:        The GeneXpert MRSA Assay (FDA approved for NASAL specimens only), is one component of a comprehensive MRSA colonization surveillance program. It is not intended to  diagnose MRSA infection nor to guide or monitor treatment for MRSA infections.     RADIOLOGY STUDIES/RESULTS: Dg Chest 2 View  10/10/2015  CLINICAL DATA:  Shortness of breath for 2 days EXAM: CHEST  2 VIEW COMPARISON:  October 10, 2015 FINDINGS: There is patchy left base atelectasis. There is no edema or consolidation. Heart is upper normal in size with pulmonary vascularity within normal limits. No adenopathy. No bone lesions. IMPRESSION: Left base atelectasis.  No frank edema or consolidation. Electronically Signed   By: Lowella Grip III M.D.  On: 10/10/2015 15:16   Dg Chest Portable 1 View  10/10/2015  CLINICAL DATA:  Forty history of heart probable EXAM: PORTABLE CHEST 1 VIEW COMPARISON:  11/07/2014. FINDINGS: 1245 hours. No focal airspace consolidation or pulmonary edema. No pleural effusion. Wedge-shaped opacity over the right upper lung is probably related to clothing or material seen over the soft tissues of the right shoulder. The cardio pericardial silhouette is enlarged. The visualized bony structures of the thorax are intact. Telemetry leads overlie the chest. IMPRESSION: Wedge-shaped opacity over the right apex. Repeat PA and lateral chest x-ray recommended, when the patient is able, without overlying clothing. Electronically Signed   By: Misty Stanley M.D.   On: 10/10/2015 12:55    Oren Binet, MD  [facility] Pager:[phone]  If 7PM-7AM, please contact night-coverage [email or web address] Password [email or web address] 10/12/2015, 10:59 AM   LOS: 2 days

## 2015-10-13 DIAGNOSIS — E871 Hypo-osmolality and hyponatremia: Secondary | ICD-10-CM

## 2015-10-13 LAB — BASIC METABOLIC PANEL
ANION GAP: 11 (ref 5–15)
BUN: 10 mg/dL (ref 6–20)
CHLORIDE: 94 mmol/L — AB (ref 101–111)
CO2: 24 mmol/L (ref 22–32)
Calcium: 8 mg/dL — ABNORMAL LOW (ref 8.9–10.3)
Creatinine, Ser: 0.93 mg/dL (ref 0.61–1.24)
GFR calc non Af Amer: >60 mL/min (ref >60)
Glucose, Bld: 89 mg/dL (ref 65–99)
POTASSIUM: 4.1 mmol/L (ref 3.5–5.1)
Sodium: 129 mmol/L — ABNORMAL LOW (ref 135–145)

## 2015-10-13 LAB — PROTIME-INR
INR: 2.33 — ABNORMAL HIGH (ref 0.00–1.49)
Prothrombin Time: 25.4 seconds — ABNORMAL HIGH (ref 11.6–15.2)

## 2015-10-13 MED ORDER — METOPROLOL SUCCINATE ER 50 MG PO TB24: 150.0000 mg | ORAL_TABLET | Freq: Every day | ORAL | Status: DC

## 2015-10-13 MED ORDER — FUROSEMIDE 10 MG/ML IJ SOLN
20.0000 mg | Freq: Once | INTRAMUSCULAR | Status: AC
  Administered 2015-10-13: 20 mg via INTRAVENOUS
  Filled 2015-10-13: qty 2

## 2015-10-13 NOTE — Progress Notes (Signed)
PATIENT DETAILS Name: Ricardo Valencia Age: 82 y.o. Sex: male Date of Birth: 11/27/1928 Admit Date: 10/10/2015 Admitting Physician Gennaro Africa, MD MJ:[id number] W, MD  Subjective: No complaints-some of the vesicles have now started to crust. Completely awake and alert this morning  Assessment/Plan: Active Problems: A. fib RVR: Ttitrated off Cardizem infusion, now rate controlled with Toprol.CHA2DS2-VASc 4. INR therapeutic, pharmacy managing.   Herpes zoster: Mostly involving the right side of his face. Most of the lesions have now started to crust. No evidence of facial paralysis at this point. Continue Valtrex. Maintain on airborne isolation until all lesions are crusted.  Hyponatremia: Mild, asymptomatic-1 dose of IV Lasix today and follow electrolytes in a.m. If it drops further-will start workup.  History of CAD: Without chest pain or shortness of breath. History of PCI with DES to mid LAD 10/2014. Per cardiology-continue Plavix and Coumadin for now, okay to stop Plavix in February 2016.  Chronic systolic heart failure: Likely ischemic cardiomyopathy, EF has improved post-PCI. Clinically euvolemic. Continue lisinopril and Toprol. Follow.  Dyslipidemia: Continue statin.  Mild delirium: Suspect mild sundowning-likely has underlying mild cognitive dysfunction at baseline. Currently awake and alert.  Disposition: Remain inpatient-transfer to telemetry  Antimicrobial agents  See below  Anti-infectives    Start     Dose/Rate Route Frequency Ordered Stop   10/11/15 2200  valACYclovir (VALTREX) tablet 1,000 mg     1,000 mg Oral 2 times daily 10/11/15 0925     10/11/15 1000  valACYclovir (VALTREX) tablet 1,000 mg  Status:  Discontinued     1,000 mg Oral 2 times daily 10/10/15 1751 10/11/15 0903   10/11/15 1000  valACYclovir (VALTREX) tablet 1,000 mg  Status:  Discontinued     1,000 mg Oral 3 times daily 10/11/15 0903 10/11/15 0925   10/10/15 1600   valACYclovir (VALTREX) tablet 1,000 mg     1,000 mg Oral  Once 10/10/15 1547 10/10/15 1652      DVT Prophylaxis: Coumadin  Code Status: Full code   Family Communication None at bedside  Procedures: None  CONSULTS:  cardiology  Time spent 25 minutes-Greater than 50% of this time was spent in counseling, explanation of diagnosis, planning of further management, and coordination of care.  MEDICATIONS: Scheduled Meds: . atorvastatin  40 mg Oral QPC supper  . clopidogrel  75 mg Oral Q breakfast  . guaiFENesin  1,200 mg Oral BID  . lisinopril  5 mg Oral Q breakfast  . metoprolol succinate  100 mg Oral QPC supper  . valACYclovir  1,000 mg Oral BID  . warfarin  5 mg Oral Once per day on Sun Mon Tue Wed Thu Sat  . [START ON 10/14/2015] warfarin  7.5 mg Oral Once per day on Fri  . Warfarin - Pharmacist Dosing Inpatient   Does not apply q1800   Continuous Infusions:  PRN Meds:.acetaminophen, menthol-cetylpyridinium, morphine injection    PHYSICAL EXAM: Vital signs in last 24 hours: Filed Vitals:   10/12/15 2339 10/13/15 0000 10/13/15 0326 10/13/15 0729  BP: 149/94 149/90 164/93 148/98  Pulse:      Temp: 99 F (37.2 C) 99.4 F (37.4 C) 99.5 F (37.5 C) 98.1 F (36.7 C)  TempSrc: Oral Oral Oral Oral  Resp: 12 21 17 21   Height:      Weight:      SpO2: 98% 97%  98%    Weight change:  [employer]  10/10/15 2100  Weight: 89.6 kg (197 lb 8.5 oz)   Body mass index is 29.16 kg/(m^2).   Gen Exam: Awake and alert with clear speech.   Neck: Supple, No JVD.  Chest: B/L Clear.   CVS: S1 S2 irregular, no murmurs.  Abdomen: soft, BS +, non tender, non distended.  Extremities: no edema, lower extremities warm to touch. Neurologic: Non Focal.   Skin: Most of the rash on the right side of his face have crusted..  Intake/Output from previous day:  Intake/Output Summary (Last 24 hours) at 10/13/15 1004 Last data filed at 10/13/15 0900  Gross per 24 hour  Intake    440  ml  Output   1400 ml  Net   -960 ml     LAB RESULTS: CBC  Recent Labs Lab 10/10/15 1138 10/11/15 0240 10/12/15 0433  WBC 9.0 7.5 10.0  HGB 17.1* 15.7 15.9  HCT 48.3 46.4 44.1  PLT 116* 102* 105*  MCV 86.3 85.5 87.0  MCH 30.5 28.9 31.4  MCHC 35.4 33.8 36.1*  RDW 14.6 14.8 15.0  LYMPHSABS  --  0.8  --   MONOABS  --  0.6  --   EOSABS  --  0.2  --   BASOSABS  --  0.1  --     Chemistries   Recent Labs Lab 10/10/15 1138 10/11/15 0240 10/12/15 0433 10/13/15 0315  NA 135 134* 131* 129*  K 4.0 4.1 3.7 4.1  CL 98* 97* 94* 94*  CO2 25 25 21* 24  GLUCOSE 115* 115* 115* 89  BUN 11 13 12 10   CREATININE 1.34* 1.36* 1.08 0.93  CALCIUM 9.0 8.6* 7.4* 8.0*    CBG: No results for input(s): GLUCAP in the last 168 hours.  GFR Estimated Creatinine Clearance: 65.5 mL/min (by C-G formula based on Cr of 0.93).  Coagulation profile  Recent Labs Lab 10/10/15 1138 10/11/15 0240 10/12/15 0433 10/13/15 0315  INR 1.86* 1.89* 2.19* 2.33*    Cardiac Enzymes No results for input(s): CKMB, TROPONINI, MYOGLOBIN in the last 168 hours.  Invalid input(s): CK  Invalid input(s): POCBNP No results for input(s): DDIMER in the last 72 hours. No results for input(s): HGBA1C in the last 72 hours. No results for input(s): CHOL, HDL, LDLCALC, TRIG, CHOLHDL, LDLDIRECT in the last 72 hours. No results for input(s): TSH, T4TOTAL, T3FREE, THYROIDAB in the last 72 hours.  Invalid input(s): FREET3 No results for input(s): VITAMINB12, FOLATE, FERRITIN, TIBC, IRON, RETICCTPCT in the last 72 hours. No results for input(s): LIPASE, AMYLASE in the last 72 hours.  Urine Studies No results for input(s): UHGB, CRYS in the last 72 hours.  Invalid input(s): UACOL, UAPR, USPG, UPH, UTP, UGL, UKET, UBIL, UNIT, UROB, ULEU, UEPI, UWBC, URBC, UBAC, CAST, UCOM, BILUA  MICROBIOLOGY: Recent Results (from the past 240 hour(s))  MRSA PCR Screening     Status: None   Collection Time: 10/10/15  9:25 PM    Result Value Ref Range Status   MRSA by PCR NEGATIVE NEGATIVE Final    Comment:        The GeneXpert MRSA Assay (FDA approved for NASAL specimens only), is one component of a comprehensive MRSA colonization surveillance program. It is not intended to diagnose MRSA infection nor to guide or monitor treatment for MRSA infections.     RADIOLOGY STUDIES/RESULTS: Dg Chest 2 View  10/10/2015  CLINICAL DATA:  Shortness of breath for 2 days EXAM: CHEST  2 VIEW COMPARISON:  October 10, 2015 FINDINGS: There is  patchy left base atelectasis. There is no edema or consolidation. Heart is upper normal in size with pulmonary vascularity within normal limits. No adenopathy. No bone lesions. IMPRESSION: Left base atelectasis.  No frank edema or consolidation. Electronically Signed   By: Lowella Grip III M.D.   On: 10/10/2015 15:16   Dg Chest Portable 1 View  10/10/2015  CLINICAL DATA:  Forty history of heart probable EXAM: PORTABLE CHEST 1 VIEW COMPARISON:  11/07/2014. FINDINGS: 1245 hours. No focal airspace consolidation or pulmonary edema. No pleural effusion. Wedge-shaped opacity over the right upper lung is probably related to clothing or material seen over the soft tissues of the right shoulder. The cardio pericardial silhouette is enlarged. The visualized bony structures of the thorax are intact. Telemetry leads overlie the chest. IMPRESSION: Wedge-shaped opacity over the right apex. Repeat PA and lateral chest x-ray recommended, when the patient is able, without overlying clothing. Electronically Signed   By: Misty Stanley M.D.   On: 10/10/2015 12:55    Oren Binet, MD  [facility] Pager:[phone]  If 7PM-7AM, please contact night-coverage [email or web address] Password [email or web address] 10/13/2015, 10:04 AM   LOS: 3 days

## 2015-10-13 NOTE — Progress Notes (Signed)
[place] for Warfarin Indication: atrial fibrillation  No Known Allergies  Patient Measurements: Height: 5\' 9"  (175.3 cm) Weight: 197 lb 8.5 oz (89.6 kg) IBW/kg (Calculated) : 70.7Total Body Weight: 83.6kg  Vital Signs: Temp: 99 F (37.2 C) (01/26 1128) Temp Source: Oral (01/26 1128) BP: 132/85 mmHg (01/26 1128) Pulse Rate: 85 (01/26 1128)  Labs:  Recent Labs  10/11/15 0240 10/12/15 0433 10/13/15 0315  HGB 15.7 15.9  --   HCT 46.4 44.1  --   PLT 102* 105*  --   LABPROT 21.6* 24.2* 25.4*  INR 1.89* 2.19* 2.33*  CREATININE 1.36* 1.08 0.93    Estimated Creatinine Clearance: 65.5 mL/min (by C-G formula based on Cr of 0.93).   Medical History: Past Medical History  Diagnosis Date  . Allergy     hay fever, allergies  . Hyperlipidemia   . Hypertension   . CAD (coronary artery disease)     DES to mid LAD 11/10/2014  . Atrial fibrillation ([place]) 11/07/2014    Medications:   Scheduled:  . atorvastatin  40 mg Oral QPC supper  . clopidogrel  75 mg Oral Q breakfast  . guaiFENesin  1,200 mg Oral BID  . lisinopril  5 mg Oral Q breakfast  . metoprolol succinate  100 mg Oral QPC supper  . valACYclovir  1,000 mg Oral BID  . warfarin  5 mg Oral Once per day on Sun Mon Tue Wed Thu Sat  . [START ON 10/14/2015] warfarin  7.5 mg Oral Once per day on Fri  . 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 Presents w/ fatigue and shortness of breath, generally unwell the past 3-4 days, herpes zoster like rash on R neck and face that itches. Pharmacy consulted to dose warfarin for Afib. Hgb 15.9.1, Plts 105, no bleeding noted.  Today's INR 2.33 at goal   PTA warfarin: Takes 1.5 tabs (7.5mg ) po on Friday at bedtime, Takes 1 tab (5mg )  po daily at bedtime all other days.  Goal of Therapy:  INR 2-3 Monitor platelets by anticoagulation protocol: Yes   Plan:  Continue home dose warfarin 5mg  daily except 7.5mg  on Fri If patient remains stable consider decreasing INR monitoring, will continue daily INR for now Monitor CBC   Darl Pikes, PharmD Clinical Pharmacist- Resident Pager: [phone]  10/13/2015 1:36 PM

## 2015-10-13 NOTE — Evaluation (Signed)
Physical Therapy Evaluation Patient Details Name: Ricardo Valencia MRN: [id number] DOB: 11/29/1928 Today's Date: 10/13/2015   History of Present Illness  Patient is a 82 yo male with history of HTN, HLD, CAD s/p stent to mid LAD in Feb 2016, PAF on coumadin, cardiomyopathy who presented with cc of dyspnea and dizziness that occurred sporadically over the past 2 days with feeling of presyncope. No chest pain/cough/fever/chills. He also has had rash described as itchy and painful on the right side of his face/neck/upper chest that has gotten progressively worse with vesicles bursting open. The rash appeared first on Thu/Fri  Clinical Impression  Pt with above history eager to get out of bed today. Initial ambulation attempt requiring Min Guard for safety due to balance deficits. Lacking insight into deficits as he states he will be going back to work this week. Pt presents with decreased strength and overall decreased mobility due to cardiopulmonary status. HR increased to 129 with increased activity, decreased with rest break. Will continue to follow pt acutely before D/C home with wife for intermittent supervision/assistance with use of RW for balance.     Follow Up Recommendations Supervision - Intermittent-declining HHPT    Equipment Recommendations  None recommended by PT    Recommendations for Other Services       Precautions / Restrictions Precautions Precautions: Fall Precaution Comments: contract, airborne Restrictions Weight Bearing Restrictions: No      Mobility  Bed Mobility Overal bed mobility: Modified Independent             General bed mobility comments: HOB slightly elevated, achieved sitting EOB w/o difficulty  Transfers Overall transfer level: Needs assistance Equipment used: 1 person hand held assist Transfers: Sit to/from Stand Sit to Stand: Min guard         General transfer comment: Min guard for balance, stable upon initially  standing  Ambulation/Gait Ambulation/Gait assistance: Min guard Ambulation Distance (Feet): 50 Feet Assistive device: None Gait Pattern/deviations: Step-through pattern;Decreased stride length Gait velocity: decreased Gait velocity interpretation: Below normal speed for age/gender General Gait Details: Min Guard for safety for balance in room as it was the initial time Building surveyor Rankin (Stroke Patients Only)       Balance Overall balance assessment: Needs assistance Sitting-balance support: Bilateral upper extremity supported;Feet unsupported Sitting balance-Leahy Scale: Fair Sitting balance - Comments: looses balance posteriorly when performing strength evaluation at EOB   Standing balance support: No upper extremity supported;During functional activity Standing balance-Leahy Scale: Fair                               Pertinent Vitals/Pain Pain Assessment: 0-10 Pain Score: 3  Pain Location: rash Pain Descriptors / Indicators: Burning Pain Intervention(s): Monitored during session    Home Living Family/patient expects to be discharged to:: Private residence Living Arrangements: Spouse/significant other Available Help at Discharge: Family Type of Home: House Home Access: Stairs to enter Entrance Stairs-Rails: Can reach both Entrance Stairs-Number of Steps: 5 Home Layout: One level Home Equipment: Environmental consultant - 2 wheels      Prior Function Level of Independence: Independent         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 Dominance   Dominant Hand: Right    Extremity/Trunk Assessment   Upper Extremity Assessment: Overall WFL for tasks assessed  Lower Extremity Assessment: Generalized weakness      Cervical / Trunk Assessment: Normal  Communication   Communication: No difficulties  Cognition Arousal/Alertness: Awake/alert Behavior During Therapy: WFL for tasks  assessed/performed Overall Cognitive Status: Impaired/Different from baseline Area of Impairment: Safety/judgement         Safety/Judgement: Decreased awareness of safety;Decreased awareness of deficits          General Comments      Exercises        Assessment/Plan    PT Assessment Patient needs continued PT services  PT Diagnosis Generalized weakness   PT Problem List Decreased activity tolerance;Decreased mobility  PT Treatment Interventions DME instruction;Gait training;Stair training;Functional mobility training;Therapeutic activities;Therapeutic exercise;Balance training   PT Goals (Current goals can be found in the Care Plan section) Acute Rehab PT Goals Patient Stated Goal: regain strength PT Goal Formulation: With patient Time For Goal Achievement: 10/27/15 Potential to Achieve Goals: Good    Frequency Min 2X/week   Barriers to discharge        Co-evaluation               End of Session Equipment Utilized During Treatment: Gait belt Activity Tolerance: Patient tolerated treatment well Patient left: in chair;with call bell/phone within reach;with chair alarm set;with family/visitor present Nurse Communication: Mobility status         Time: [id number] PT Time Calculation (min) (ACUTE ONLY): 17 min   Charges:   PT Evaluation $PT Eval Moderate Complexity: 1 Procedure     PT G Codes:        Ara Kussmaul 10-25-15, 1:55 PM Ara Kussmaul, Student Physical Therapist Acute Rehab [phone]  I have read, reviewed and agree with student's note.   [facility] [phone] (pager)

## 2015-10-13 NOTE — Care Management Important Message (Signed)
Important Message  Patient Details  Name: Ricardo Valencia MRN: [id number] Date of Birth: Feb 10, 1929   Medicare Important Message Given:  Yes    Lacretia Leigh, RN 10/13/2015, 9:50 AM

## 2015-10-14 ENCOUNTER — Telehealth: Payer: Self-pay | Admitting: *Deleted

## 2015-10-14 LAB — BASIC METABOLIC PANEL
ANION GAP: 11 (ref 5–15)
BUN: 18 mg/dL (ref 6–20)
CALCIUM: 8.3 mg/dL — AB (ref 8.9–10.3)
CHLORIDE: 99 mmol/L — AB (ref 101–111)
CO2: 22 mmol/L (ref 22–32)
Creatinine, Ser: 1.15 mg/dL (ref 0.61–1.24)
GFR calc non Af Amer: 57 mL/min — ABNORMAL LOW (ref >60)
Glucose, Bld: 78 mg/dL (ref 65–99)
POTASSIUM: 3.9 mmol/L (ref 3.5–5.1)
Sodium: 132 mmol/L — ABNORMAL LOW (ref 135–145)

## 2015-10-14 LAB — PROTIME-INR
INR: 2.44 — ABNORMAL HIGH (ref 0.00–1.49)
PROTHROMBIN TIME: 26.2 s — AB (ref 11.6–15.2)

## 2015-10-14 MED ORDER — VALACYCLOVIR HCL 1 G PO TABS: 1000.0000 mg | ORAL_TABLET | Freq: Two times a day (BID) | ORAL | Status: DC

## 2015-10-14 MED ORDER — METOPROLOL SUCCINATE ER 100 MG PO TB24: 100.0000 mg | ORAL_TABLET | Freq: Every day | ORAL | Status: DC

## 2015-10-14 NOTE — Discharge Summary (Signed)
PATIENT DETAILS Name: Ricardo Valencia Age: 82 y.o. Sex: male Date of Birth: November 13, 1928 MRN: [id number]. Admitting Physician: Gennaro Africa, MD [id number] W, MD  Admit Date: 10/10/2015 Discharge date: 10/14/2015  Recommendations for Outpatient Follow-up:  1. Complete Valtrex-most of the zoster lesions in the right side of the face have crusted 2. Check a chemistry panel at next visit 3. Ensure follow up with cardiology and the Coumadin clinic.   PRIMARY DISCHARGE DIAGNOSIS:  Active Problems:   Atrial fibrillation (HCC)   A-fib (HCC)   CAD in native artery      PAST MEDICAL HISTORY: Past Medical History  Diagnosis Date  . Allergy     hay fever, allergies  . Hyperlipidemia   . Hypertension   . CAD (coronary artery disease)     DES to mid LAD 11/10/2014  . Atrial fibrillation ([place]) 11/07/2014    DISCHARGE MEDICATIONS: Current Discharge Medication List    START taking these medications   Details  valACYclovir (VALTREX) 1000 MG tablet Take 1 tablet (1,000 mg total) by mouth 2 (two) times daily. Qty: 6 tablet, Refills: 0      CONTINUE these medications which have CHANGED   Details  metoprolol succinate (TOPROL-XL) 100 MG 24 hr tablet Take 1 tablet (100 mg total) by mouth daily. Qty: 60 tablet, Refills: 0      CONTINUE these medications which have NOT CHANGED   Details  acetaminophen (TYLENOL) 500 MG tablet Take 500 mg by mouth every 4 (four) hours as needed for fever (pain).    Ascorbic Acid (VITAMIN C) 1000 MG tablet Take 1,000 mg by mouth daily.    atorvastatin (LIPITOR) 40 MG tablet Take 1 tablet (40 mg total) by mouth daily at 6 PM. Qty: 30 tablet, Refills: 11    clopidogrel (PLAVIX) 75 MG tablet Take 1 tablet (75 mg total) by mouth daily with breakfast. Qty: 30 tablet, Refills: 11    lisinopril (PRINIVIL,ZESTRIL) 5 MG tablet Take 1 tablet (5 mg total) by mouth daily. Qty: 30 tablet, Refills: 11    warfarin (COUMADIN) 5 MG tablet TAKE AS DIRECTED  BY COUMADIN CLINIC Qty: 35 tablet, Refills: 3      STOP taking these medications     CARTIA XT 240 MG 24 hr capsule         ALLERGIES:  No Known Allergies  BRIEF HPI:  See H&P, Labs, Consult and Test reports for all details in brief, a 82 yo male with history of HTN, HLD, CAD s/p stent to mid LAD in Feb 2016, PAF on coumadin, cardiomyopathy who presented with cc of dyspnea and dizziness. Rash on the right side of the face was also present on admission-this had occurred Thursday prior to this admission.  CONSULTATIONS:   cardiology  PERTINENT RADIOLOGIC STUDIES: Dg Chest 2 View  10/10/2015  CLINICAL DATA:  Shortness of breath for 2 days EXAM: CHEST  2 VIEW COMPARISON:  October 10, 2015 FINDINGS: There is patchy left base atelectasis. There is no edema or consolidation. Heart is upper normal in size with pulmonary vascularity within normal limits. No adenopathy. No bone lesions. IMPRESSION: Left base atelectasis.  No frank edema or consolidation. Electronically Signed   By: Lowella Grip III M.D.   On: 10/10/2015 15:16   Dg Chest Portable 1 View  10/10/2015  CLINICAL DATA:  Forty history of heart probable EXAM: PORTABLE CHEST 1 VIEW COMPARISON:  11/07/2014. FINDINGS: 1245 hours. No focal airspace consolidation or pulmonary edema. No pleural effusion. Wedge-shaped opacity  over the right upper lung is probably related to clothing or material seen over the soft tissues of the right shoulder. The cardio pericardial silhouette is enlarged. The visualized bony structures of the thorax are intact. Telemetry leads overlie the chest. IMPRESSION: Wedge-shaped opacity over the right apex. Repeat PA and lateral chest x-ray recommended, when the patient is able, without overlying clothing. Electronically Signed   By: Misty Stanley M.D.   On: 10/10/2015 12:55     PERTINENT LAB RESULTS: CBC:  Recent Labs  10/12/15 0433  WBC 10.0  HGB 15.9  HCT 44.1  PLT 105*   CMET CMP     Component  Value Date/Time   NA 132* 10/14/2015 0901   K 3.9 10/14/2015 0901   CL 99* 10/14/2015 0901   CO2 22 10/14/2015 0901   GLUCOSE 78 10/14/2015 0901   BUN 18 10/14/2015 0901   CREATININE 1.15 10/14/2015 0901   CALCIUM 8.3* 10/14/2015 0901   PROT 5.8* 10/12/2015 0433   ALBUMIN 3.0* 10/12/2015 0433   AST 23 10/12/2015 0433   ALT 19 10/12/2015 0433   ALKPHOS 64 10/12/2015 0433   BILITOT 2.1* 10/12/2015 0433   GFRNONAA 57* 10/14/2015 0901   GFRAA >60 10/14/2015 0901    GFR Estimated Creatinine Clearance: 53 mL/min (by C-G formula based on Cr of 1.15). No results for input(s): LIPASE, AMYLASE in the last 72 hours. No results for input(s): CKTOTAL, CKMB, CKMBINDEX, TROPONINI in the last 72 hours. Invalid input(s): POCBNP No results for input(s): DDIMER in the last 72 hours. No results for input(s): HGBA1C in the last 72 hours. No results for input(s): CHOL, HDL, LDLCALC, TRIG, CHOLHDL, LDLDIRECT in the last 72 hours. No results for input(s): TSH, T4TOTAL, T3FREE, THYROIDAB in the last 72 hours.  Invalid input(s): FREET3 No results for input(s): VITAMINB12, FOLATE, FERRITIN, TIBC, IRON, RETICCTPCT in the last 72 hours. Coags:  Recent Labs  10/13/15 0315 10/14/15 0901  INR 2.33* 2.44*   Microbiology: Recent Results (from the past 240 hour(s))  MRSA PCR Screening     Status: None   Collection Time: 10/10/15  9:25 PM  Result Value Ref Range Status   MRSA by PCR NEGATIVE NEGATIVE Final    Comment:        The GeneXpert MRSA Assay (FDA approved for NASAL specimens only), is one component of a comprehensive MRSA colonization surveillance program. It is not intended to diagnose MRSA infection nor to guide or monitor treatment for MRSA infections.      BRIEF HOSPITAL COURSE: A. fib RVR: Started and then Ttitrated off Cardizem infusion, now rate controlled with Toprol.CHA2DS2-VASc 4. INR therapeutic, pharmacy managed during this hospital stay-patient has appointment with  coumadin clinic next week. Have instructed him to keep his appointment. Cards consulted during this hospital stay  Herpes zoster: Mostly involving the right side of his face. Almost all of the lesions have now crusted. No evidence of facial paralysis at this point. Continue Valtrex for 3 more days to complete 7 days of treatment. Maintained on airborne isolation during this hospital stay  Hyponatremia: Mild, asymptomatic-given 1 dose of Lasix, electrolytes show serum sodium significantly improved.   History of CAD: Without chest pain or shortness of breath. History of PCI with DES to mid LAD 10/2014. Per cardiology-continue Plavix and Coumadin for now, okay to stop Plavix in February 2016. Please ensure follow up with cardiology.  Chronic systolic heart failure: Likely ischemic cardiomyopathy, EF has improved post-PCI. Clinically euvolemic. Continue lisinopril and Toprol. Follow.  Dyslipidemia: Continue statin.  Mild delirium: Suspect mild sundowning-likely has underlying mild cognitive dysfunction at baseline. Currently awake and alert. Stable for discharge  TODAY-DAY OF DISCHARGE:  Subjective:   Tracey Witteman today has no headache,no chest abdominal pain,no new weakness tingling or numbness, feels much better wants to go home today.   Objective:   Blood pressure 127/83, pulse 98, temperature 98.5 F (36.9 C), temperature source Oral, resp. rate 23, height 5\' 9"  (1.753 m), weight 89.6 kg (197 lb 8.5 oz), SpO2 97 %.  Intake/Output Summary (Last 24 hours) at 10/14/15 1144 Last data filed at 10/14/15 0930  Gross per 24 hour  Intake    400 ml  Output    550 ml  Net   -150 ml   Filed Weights   10/10/15 2100  Weight: 89.6 kg (197 lb 8.5 oz)    Exam Awake Alert, Oriented *3, No new F.N deficits, Normal affect Solomon.AT,PERRAL Supple Neck,No JVD, No cervical lymphadenopathy appriciated.  Symmetrical Chest wall movement, Good air movement bilaterally, CTAB RRR,No Gallops,Rubs or new  Murmurs, No Parasternal Heave +ve B.Sounds, Abd Soft, Non tender, No organomegaly appriciated, No rebound -guarding or rigidity. No Cyanosis, Clubbing or edema, No new Rash or bruise  DISCHARGE CONDITION: Stable  DISPOSITION: Home with home health services  DISCHARGE INSTRUCTIONS:    Activity:  As tolerated with Full fall precautions use walker/cane & assistance as needed  Get Medicines reviewed and adjusted: Please take all your medications with you for your next visit with your Primary MD  Please request your Primary MD to go over all hospital tests and procedure/radiological results at the follow up, please ask your Primary MD to get all Hospital records sent to his/her office.  If you experience worsening of your admission symptoms, develop shortness of breath, life threatening emergency, suicidal or homicidal thoughts you must seek medical attention immediately by calling 911 or calling your MD immediately  if symptoms less severe.  You must read complete instructions/literature along with all the possible adverse reactions/side effects for all the Medicines you take and that have been prescribed to you. Take any new Medicines after you have completely understood and accpet all the possible adverse reactions/side effects.   Do not drive when taking Pain medications.   Do not take more than prescribed Pain, Sleep and Anxiety Medications  Special Instructions: If you have smoked or chewed Tobacco  in the last 2 yrs please stop smoking, stop any regular Alcohol  and or any Recreational drug use.  Wear Seat belts while driving.  Please note  You were cared for by a hospitalist during your hospital stay. Once you are discharged, your primary care physician will handle any further medical issues. Please note that NO REFILLS for any discharge medications will be authorized once you are discharged, as it is imperative that you return to your primary care physician (or establish a  relationship with a primary care physician if you do not have one) for your aftercare needs so that they can reassess your need for medications and monitor your lab values.   Diet recommendation: Heart Healthy diet Fluid restriction 1.5 lit/day  Discharge Instructions    (HEART FAILURE PATIENTS) Call MD:  Anytime you have any of the following symptoms: 1) 3 pound weight gain in 24 hours or 5 pounds in 1 week 2) shortness of breath, with or without a dry hacking cough 3) swelling in the hands, feet or stomach 4) if you have to sleep on extra pillows  at night in order to breathe.    Complete by:  As directed      Call MD for:  persistant nausea and vomiting    Complete by:  As directed      Call MD for:  redness, tenderness, or signs of infection (pain, swelling, redness, odor or green/yellow discharge around incision site)    Complete by:  As directed      Call MD for:  temperature >100.4    Complete by:  As directed      Diet - low sodium heart healthy    Complete by:  As directed      Increase activity slowly    Complete by:  As directed           Follow-up Information    Follow up with [facility].   Why:  phy ther will call within 24-48 hrs to set up appt.   Contact information:   [address] [place] [address] [phone]       Follow up with Eulas Post, MD. Schedule an appointment as soon as possible for a visit in 1 week.   Specialty:  Family Medicine   Why:  Hospital follow up   Contact information:   [place] [address] [phone]       Follow up with Lauree Chandler, MD. Schedule an appointment as soon as possible for a visit in 2 weeks.   Specialty:  Cardiology   Contact information:   [address] [place] [address] [phone]       Follow up with [facility]. Schedule an appointment as soon as possible for a visit on 10/17/2015.   Specialty:   Cardiology   Why:  Hospital follow up   Contact information:   [address], [place] [phone]      Total Time spent on discharge equals 45 minutes.  SignedOren Binet 10/14/2015 11:44 AM

## 2015-10-14 NOTE — Progress Notes (Signed)
Pt is for discharge home. Instructions given on how to take care of the shingles on right face. Gave emphasis on washing hands frequently.

## 2015-10-14 NOTE — Progress Notes (Signed)
Discharged home accompanied by wife, discharged instructions and prescription given, belongings taken home.

## 2015-10-14 NOTE — Telephone Encounter (Signed)
Transition Care Management Follow-up Telephone Call  How have you been since you were released from the hospital? Doing pretty well - wife responeded   Do you understand why you were in the hospital? yes   Do you understand the discharge instrcutions? yes  Items Reviewed:  Medications reviewed: yes  Allergies reviewed: yes  Dietary changes reviewed: yes  Referrals reviewed: yes   Functional Questionnaire:   Activities of Daily Living (ADLs):   He states they are independent in the following: ambulation, bathing and hygiene, feeding, continence, grooming, toileting and dressing States they require assistance with the following:  none   Any transportation issues/concerns?: no   Any patient concerns? no   Confirmed importance and date/time of follow-up visits scheduled: yes   Confirmed with patient if condition begins to worsen call PCP or go to the ER.  Patient was given the Call-a-Nurse line [phone]: yes Patient was discharged 10/14/15 Patient was discharged to his home Patient has an appointment with Dr [name] 10/20/15

## 2015-10-14 NOTE — Care Management Note (Signed)
Case Management Note  Patient Details  Name: Ricardo Valencia MRN: [id number] Date of Birth: Dec 16, 1928  Subjective/Objective:      Adm w at fib              Action/Plan: lives w wife, pcp dr burchette   Expected Discharge Date:                  Expected Discharge Plan:  [place]  In-House Referral:     Discharge planning Services  CM Consult  Post Acute Care Choice:  Home Health Choice offered to:  Patient  DME Arranged:    DME Agency:     HH Arranged:  PT [place]:  [facility]  Status of Service:     Medicare Important Message Given:  Yes Date Medicare IM Given:    Medicare IM give by:    Date Additional Medicare IM Given:    Additional Medicare Important Message give by:     If discussed at [place] of Stay Meetings, dates discussed:    Additional Comments: pt for dc today. md ordered hhphy there. Went over agency list and no pref by pt. [facility] for [facility].  Lacretia Leigh, RN 10/14/2015, 11:07 AM

## 2015-10-17 ENCOUNTER — Telehealth: Payer: Self-pay | Admitting: Family Medicine

## 2015-10-17 NOTE — Telephone Encounter (Signed)
[facility]  Patient Name: [name]  DOB: 1929/05/14    Initial Comment Caller states husband has shingles, only given 6 pills for Valtrex. Is out of RX and suppose to take 2/ day for 3 days , wants to know if he should take more and wants something for pain. Still having pain , but the shingles are scabbed over    Nurse Assessment  Nurse: Harlow Mares, RN, Suanne Marker Date/Time Eilene Ghazi Time): 10/17/2015 3:16:34 PM  Confirm and document reason for call. If symptomatic, describe symptoms. You must click the next button to save text entered. ---Caller states husband has shingles, only given 6 pills for Valtrex. Is out of RX and suppose to take 2/ day for 3 days , wants to know if he should take more and wants something for pain. Still having pain , but the shingles are scabbed over. Wants to know if he needs to have a refill of the Valtrex. Shingles are on the right side of the face, ear, under jaw and right shoulder. Rates pain 5/10. No oozing of the rash. Came home from the hospital on Friday. Area is bleeding a bit, taking Warfarin. (slight oozing). Has appt with Dr. [name] on Thursday.  Has the patient traveled out of the country within the last 30 days? ---No  Does the patient have any new or worsening symptoms? ---Yes  Will a triage be completed? ---Yes  Related visit to physician within the last 2 weeks? ---Yes  Does the PT have any chronic conditions? (i.e. diabetes, asthma, etc.) ---Yes  List chronic conditions. ---a-fib (takes blood thinner, Coumadin)  Is this a behavioral health or substance abuse call? ---No     Guidelines    Guideline Title Affirmed Question Affirmed Notes  Shingles [1] Shingles rash already diagnosed and [2] taking antiviral medication    Final Disposition [place], RN, Suanne Marker    Comments  Called backline and spoke with nurse regarding caller's question about continuing Valtrex as patient only has 1 more  dose left. Does he need a refill to continue? Also that patient was wondering if MD would recommend something else besides OTC tylenol for pain. Nurse advised that Dr. Erick Blinks nurse will call the caller back after clinic today when she has a chance to speak with Dr. [name] about this. Advised caller that she will getting a call from office this evening.   Referrals  REFERRED TO PCP OFFICE   Disagree/Comply: Comply

## 2015-10-17 NOTE — Telephone Encounter (Deleted)
[facility]  Patient Name: Ricardo Valencia  DOB: 10/18/1928    Initial Comment Caller states husband has shingles, only given 6 pills for Valtrex. Is out of RX and suppose to take 2/ day for 3 days , wants to know if he should take more and wants something for pain. Still having pain , but the shingles are scabbed over    Nurse Assessment  Nurse: Harlow Mares, RN, Suanne Marker Date/Time Eilene Ghazi Time): 10/17/2015 3:16:34 PM  Confirm and document reason for call. If symptomatic, describe symptoms. You must click the next button to save text entered. ---Caller states husband has shingles, only given 6 pills for Valtrex. Is out of RX and suppose to take 2/ day for 3 days , wants to know if he should take more and wants something for pain. Still having pain , but the shingles are scabbed over. Wants to know if he needs to have a refill of the Valtrex. Shingles are on the right side of the face, ear, under jaw and right shoulder. Rates pain 5/10. No oozing of the rash. Came home from the hospital on Friday. Area is bleeding a bit, taking Warfarin. (slight oozing). Has appt with Dr. [name] on Thursday.  Has the patient traveled out of the country within the last 30 days? ---No  Does the patient have any new or worsening symptoms? ---Yes  Will a triage be completed? ---Yes  Related visit to physician within the last 2 weeks? ---Yes  Does the PT have any chronic conditions? (i.e. diabetes, asthma, etc.) ---Yes  List chronic conditions. ---a-fib (takes blood thinner, Coumadin)  Is this a behavioral health or substance abuse call? ---No     Guidelines    Guideline Title Affirmed Question Affirmed Notes       Final Disposition User        Comments  Called backline and spoke with nurse regarding caller's question about continuing Valtrex as patient only has 1 more dose left. Does he need a refill to continue? Also that patient was wondering if MD would recommend  something else besides OTC tylenol for pain. Nurse advised that Dr. Erick Blinks nurse will call the caller back after clinic today when she has a chance to speak with Dr. [name] about this. Advised caller that she will getting a call from office this evening.

## 2015-10-18 NOTE — Telephone Encounter (Signed)
Finish out the Valtrex.  Indication is just for one week of total of therapy.  IF he has pain poorly controlled, needs office follow up to discuss pain rx options.

## 2015-10-18 NOTE — Telephone Encounter (Signed)
Wife is aware

## 2015-10-18 NOTE — Telephone Encounter (Signed)
Left message for pt to call back  °

## 2015-10-19 ENCOUNTER — Telehealth: Payer: Self-pay | Admitting: Family Medicine

## 2015-10-19 NOTE — Telephone Encounter (Signed)
Spoke with Tharon Aquas, She is unable to reach pt via home phone and cell phone. I gave her pts Daughters cell phone number. She will get [place] up dated.

## 2015-10-19 NOTE — Telephone Encounter (Signed)
[place] Therapist w/[facility] is trying to do an PT evaluation but she cannot reach the patient.  She have left messages on the first telephone number, but no one will return her call, and the second number is not good.  She needs clarification if the patient really wants physical therapy, and if he does, she needs a valid telephone number.

## 2015-10-20 ENCOUNTER — Encounter: Payer: Self-pay | Admitting: Family Medicine

## 2015-10-20 ENCOUNTER — Ambulatory Visit (INDEPENDENT_AMBULATORY_CARE_PROVIDER_SITE_OTHER): Payer: Medicare Other | Admitting: Family Medicine

## 2015-10-20 VITALS — BP 150/82 | HR 98 | Temp 98.3°F | Ht 69.0 in | Wt 192.2 lb

## 2015-10-20 DIAGNOSIS — I10 Essential (primary) hypertension: Secondary | ICD-10-CM

## 2015-10-20 DIAGNOSIS — C4491 Basal cell carcinoma of skin, unspecified: Secondary | ICD-10-CM

## 2015-10-20 DIAGNOSIS — I481 Persistent atrial fibrillation: Secondary | ICD-10-CM | POA: Diagnosis not present

## 2015-10-20 DIAGNOSIS — E871 Hypo-osmolality and hyponatremia: Secondary | ICD-10-CM

## 2015-10-20 DIAGNOSIS — B029 Zoster without complications: Secondary | ICD-10-CM | POA: Diagnosis not present

## 2015-10-20 DIAGNOSIS — I4819 Other persistent atrial fibrillation: Secondary | ICD-10-CM

## 2015-10-20 LAB — BASIC METABOLIC PANEL
BUN: 10 mg/dL (ref 6–23)
CALCIUM: 8.9 mg/dL (ref 8.4–10.5)
CO2: 29 mEq/L (ref 19–32)
Chloride: 101 mEq/L (ref 96–112)
Creatinine, Ser: 1.12 mg/dL (ref 0.40–1.50)
GFR: 66.35 mL/min (ref >60.00)
GLUCOSE: 96 mg/dL (ref 70–99)
Potassium: 4.1 mEq/L (ref 3.5–5.1)
SODIUM: 136 meq/L (ref 135–145)

## 2015-10-20 NOTE — Progress Notes (Signed)
Subjective:    Patient ID: Ricardo Valencia, male    DOB: 1928/10/19, 82 y.o.   MRN: [id number]  HPI Patient is seen for transitional care follow-up from recent hospitalization. Patient admitted January 23 through January 27. He has multiple chronic problems including history of CAD, hypertension, hyperlipidemia, ischemic cardiomyopathy and presented with dyspnea and dizziness. Also was noted to have vesicular rash right side of face and neck consistent with shingles.  Atrial fibrillation with rapid ventricular response. He was started and subsequently titrated off Cardizem infusion. During hospitalization apparently rate controlled with increase Toprol 100 mg daily. Coumadin therapeutic.Marland Kitchen  Patient was discharged home with instructions to discontinue Cartia XT 240 mg but apparently they did not get this message and have continued with that.  Was treated for 7 days of Valtrex. His pain has been fairly well controlled with Tylenol. No involvement of the right ear canal and no evidence for facial nerve paralysis or Ramsay Hunt syndrome  Had some mild delirium with sundowning in hospital and this has improved since returning home. He does have some mild cognitive dysfunction at baseline. Denies any dyspnea or dizziness since discharge.  Past Medical History  Diagnosis Date  . Allergy     hay fever, allergies  . Hyperlipidemia   . Hypertension   . CAD (coronary artery disease)     DES to mid LAD 11/10/2014  . Atrial fibrillation ([place]) 11/07/2014   Past Surgical History  Procedure Laterality Date  . Appendectomy  1947  . Left heart catheterization with coronary angiogram N/A 11/10/2014    Procedure: LEFT HEART CATHETERIZATION WITH CORONARY ANGIOGRAM;  Surgeon: Wellington Hampshire, MD;  Location: [facility] CATH LAB;  Service: Cardiovascular;  Laterality: N/A;  . Percutaneous coronary stent intervention (pci-s)  11/10/2014    Procedure: PERCUTANEOUS CORONARY STENT INTERVENTION (PCI-S);  Surgeon:  Wellington Hampshire, MD;  Location: [facility] CATH LAB;  Service: Cardiovascular;;  . Cardioversion N/A 12/30/2014    Procedure: CARDIOVERSION;  Surgeon: Jerline Pain, MD;  Location: [facility] ENDOSCOPY;  Service: Cardiovascular;  Laterality: N/A;    reports that he has never smoked. He does not have any smokeless tobacco history on file. He reports that he does not drink alcohol or use illicit drugs. family history includes Cancer in his father and mother. No Known Allergies    Review of Systems  Constitutional: Negative for fever, chills, appetite change, fatigue and unexpected weight change.  HENT: Negative for trouble swallowing.   Respiratory: Negative for cough, shortness of breath and wheezing.   Cardiovascular: Negative for chest pain, palpitations and leg swelling.  Gastrointestinal: Negative for nausea, vomiting, abdominal pain and diarrhea.  Genitourinary: Negative for dysuria.  Skin: Positive for rash.  Neurological: Negative for dizziness and weakness.       Objective:   Physical Exam  Constitutional: He appears well-developed and well-nourished.  Neck: Neck supple.  Cardiovascular:  Irregularly irregular rhythm with rate between 96 and 100  Pulmonary/Chest: Effort normal and breath sounds normal. No respiratory distress. He has no wheezes. He has no rales.  Abdominal: Soft. There is no tenderness.  Musculoskeletal: He exhibits no edema.  Neurological: He is alert.  Skin: Rash noted.  Patient has nodular lesion left forehead consistent with likely basal cell carcinoma  Multiple areas of crusted rash right side of face, outer right ear, and right neck. No signs of secondary infection.          Assessment & Plan:  #1 atrial fibrillation with recent rapid ventricular  response. Continue metoprolol 100 mg daily. Patient was instructed to stop diltiazem but apparently continued taking this. Nevertheless, his blood pressure is borderline elevated today and pulse is still around 96-100  even with the combination of metoprolol and diltiazem. We did not instruct him to stop the diltiazem because of the above. He has follow-up with cardiology next week. Continue Coumadin with recent therapeutic INR. He is followed at Coumadin clinic  #2 hypertension with borderline elevation today. Continue close monitoring and be in touch if consistently greater than 150/90  #3 shingles involving right side of face. Healing well. Pain fairly well controlled with Tylenol. Continue to keep clean with soap and water in follow-up for secondary infection  #4 history of multiple skin cancers. He has been referred to skin surgery Center but his had poor compliance with follow-up. After stable from the above recommended follow-up with them  #5 mild hyponatremia on admission. Recheck basic metabolic panel  #6 history of chronic systolic heart failure. Clinically stable at this time. Continue lisinopril and Toprol

## 2015-10-20 NOTE — Progress Notes (Signed)
Pre visit review using our clinic review tool, if applicable. No additional management support is needed unless otherwise documented below in the visit note. 

## 2015-10-21 ENCOUNTER — Telehealth: Payer: Self-pay | Admitting: Family Medicine

## 2015-10-21 MED ORDER — TRAMADOL HCL 50 MG PO TABS: 50.0000 mg | ORAL_TABLET | Freq: Four times a day (QID) | ORAL | Status: DC | PRN

## 2015-10-21 NOTE — Telephone Encounter (Signed)
Pts wife is aware that medication has been called in.

## 2015-10-21 NOTE — Telephone Encounter (Signed)
Let's try Tramadol 50 mg one every 6 hours prn pain #60 with no refill.

## 2015-10-21 NOTE — Telephone Encounter (Signed)
Pt has itching and is in terrible pain with his shingles. Would like to know if you can prescribe pain medicine , tylenol is not getting it.  [facility]/ [address]

## 2015-10-27 NOTE — Progress Notes (Signed)
Cardiology Office Note   Date:  10/31/2015   ID:  Ricardo Valencia, DOB April 25, 1929, MRN [id number]  PCP:  Eulas Post, MD  Cardiologist:  Dr. Julianne Handler   Post hospital follow up.     History of Present Illness: Ricardo Valencia is a 82 y.o. male with a history of mild dementia, HTN, HLD, PAF on coumadin, CAD s/p DES to mLDA (10/2014), ischemic CM (25-30%--> 45-50%) and mild dementia who presents to clinic for post hospital follow up after recent admission for afib w/ RVR.   He was admitted to [facility] in 10/2014 with A. fib with RVR. Echocardiogram obtained during the admission showed EF 25-30% with moderate MR. He underwent cardiac catheterization on 11/10/2014 which showed severe mid LAD stenosis treated with 2.75 x 16 mm Xience DES, moderate disease in RCA. He was initially placed on eliquis and Plavix, eliquis was later changed to Coumadin. He came back and underwent DCCV in 12/2014. Repeat echocardiogram in April showed EF has went back to 45-50%, no significant MR noted. He was last seen by Dr. Angelena Form August 2016, at which time he was back in afib by physical exam but otherwise doing well from cardiology standpoint.  Patient admitted 1/23-1/27/17 for afib with RVR as well as shingles. He presented with dyspnea and dizziness. Also was noted to have vesicular rash right side of face and neck consistent with shingles and started on Valtrex. He had Atrial fibrillation with rapid ventricular response and started on a Cardizem infusion, which was later discontinued and started on Toprol XL 100mg  daily. Coumadin was therapeutic. Patient was discharged home with instructions to discontinue Cartia XT 240 mg but apparently they did not get this message and he continued with that.   He was seen by his PCP, Dr. [name] for post hospital follow up on 10/20/15. He was still taking both Toprol XL 100mg  and Cartia XT 240mg . His BP was noted to be quite elevated with HR in 90-100bpm and he was told to  continue both medications and follow up with cardiology.  Today he presents to clinic for post hospital follow up. He has been doing better. No CP or SOB. No LE edema, orthopnea or PND. No dizziness or syncope. No palpitations. He has not had his coumadin level checked since leaving the hospital. His biggest complaint is the pain from the shingles.    Past Medical History  Diagnosis Date  . Allergy     hay fever, allergies  . Hyperlipidemia   . Hypertension   . CAD (coronary artery disease)     DES to mid LAD 11/10/2014  . Atrial fibrillation ([place]) 11/07/2014    Past Surgical History  Procedure Laterality Date  . Appendectomy  1947  . Left heart catheterization with coronary angiogram N/A 11/10/2014    Procedure: LEFT HEART CATHETERIZATION WITH CORONARY ANGIOGRAM;  Surgeon: Wellington Hampshire, MD;  Location: [facility] CATH LAB;  Service: Cardiovascular;  Laterality: N/A;  . Percutaneous coronary stent intervention (pci-s)  11/10/2014    Procedure: PERCUTANEOUS CORONARY STENT INTERVENTION (PCI-S);  Surgeon: Wellington Hampshire, MD;  Location: [facility] CATH LAB;  Service: Cardiovascular;;  . Cardioversion N/A 12/30/2014    Procedure: CARDIOVERSION;  Surgeon: Jerline Pain, MD;  Location: [facility] ENDOSCOPY;  Service: Cardiovascular;  Laterality: N/A;     Current Outpatient Prescriptions  Medication Sig Dispense Refill  . acetaminophen (TYLENOL) 500 MG tablet Take 500 mg by mouth every 4 (four) hours as needed for fever (pain).    Marland Kitchen  Ascorbic Acid (VITAMIN C) 1000 MG tablet Take 1,000 mg by mouth daily.    Marland Kitchen atorvastatin (LIPITOR) 40 MG tablet Take 40 mg by mouth daily at 6 PM.    . clopidogrel (PLAVIX) 75 MG tablet Take 1 tablet (75 mg total) by mouth daily with breakfast. 30 tablet 11  . gabapentin (NEURONTIN) 300 MG capsule Take 300 mg by mouth 3 (three) times daily.    Marland Kitchen lisinopril (PRINIVIL,ZESTRIL) 5 MG tablet Take 5 mg by mouth every morning.    . traMADol (ULTRAM) 50 MG tablet Take 1 tablet (50 mg total)  by mouth every 6 (six) hours as needed. 60 tablet 0  . warfarin (COUMADIN) 5 MG tablet TAKE AS DIRECTED BY COUMADIN CLINIC (Patient taking differently: TAKE 1 1/2 TABLET BY MOUTH  ON FRIDAYS AT BEDTIME, TAKE 1 TABLET DAILY AT BEDTIME ON ALL OTHER DAYS OR AS DIRECTED BY COUMADIN CLINIC) 35 tablet 3  . metoprolol (LOPRESSOR) 100 MG tablet Take 1 tablet (100 mg total) by mouth 2 (two) times daily. 180 tablet 3   No current facility-administered medications for this visit.    Allergies:   Review of patient's allergies indicates no known allergies.    Social History:  The patient  reports that he has never smoked. He has never used smokeless tobacco. He reports that he does not drink alcohol or use illicit drugs.   Family History:  The patient's family history includes Cancer in his father and mother.    ROS:  Please see the history of present illness.   Otherwise, review of systems are positive for NONE.   All other systems are reviewed and negative.    PHYSICAL EXAM: VS:  BP 148/78 mmHg  Pulse 79  Ht 5\' 9"  (1.753 m)  Wt 193 lb (87.544 kg)  BMI 28.49 kg/m2 , BMI Body mass index is 28.49 kg/(m^2). GEN: Well nourished, well developed, in no acute distress HEENT: normal Neck: no JVD, carotid bruits, or masses Cardiac: irreg irreg; no murmurs, rubs, or gallops,no edema  Respiratory:  clear to auscultation bilaterally, normal work of breathing GI: soft, nontender, nondistended, + BS MS: no deformity or atrophy Skin: warm and dry, no rash Neuro:  Strength and sensation are intact Psych: euthymic mood, full affect   EKG:  EKG is  ordered today. The ekg ordered today demonstrates atrial fib with HR 79. Low voltage QRS. ILBBB  Recent Labs: 11/07/2014: B Natriuretic Peptide 464.5*; Magnesium 2.0; TSH 3.733 10/12/2015: ALT 19; Hemoglobin 15.9; Platelets 105* 10/20/2015: BUN 10; Creatinine, Ser 1.12; Potassium 4.1; Sodium 136    Lipid Panel    Component Value Date/Time   CHOL 124  12/10/2014 1043   TRIG 74 12/10/2014 1043   HDL 53 12/10/2014 1043   CHOLHDL 2.3 12/10/2014 1043   VLDL 15 12/10/2014 1043   LDLCALC 56 12/10/2014 1043   LDLDIRECT 191.1 01/04/2011 1218      Wt Readings from Last 3 Encounters:  10/31/15 193 lb (87.544 kg)  10/20/15 192 lb 3.2 oz (87.181 kg)  10/10/15 197 lb 8.5 oz (89.6 kg)      Other studies Reviewed: Additional studies/ records that were reviewed today include: 2D ECHO Review of the above records demonstrates:   2D ECHO: 01/13/2015 LV EF: 45% -  50% Study Conclusions - Left ventricle: The cavity size was normal. Wall thickness was increased in a pattern of mild LVH. Systolic function was mildly reduced. The estimated ejection fraction was in the range of 45% to 50%. Images  were inadequate for LV wall motion assessment. The study is not technically sufficient to allow evaluation of LV diastolic function. - Left atrium: Moderately dilated at 45 ml/m2. - Right atrium: Severely dilated at 30 cm2. Impressions: - LVEF 45-50%, global hypokinesis with regional variation, Moderate LAE and severe RAE. Compared to the prior study in 10/2014, LV function has improved from 25-30% up to current value.    ASSESSMENT AND PLAN:  Ricardo Valencia is a 82 y.o. male with a history of mild dementia, HTN, HLD, PAF on coumadin, CAD s/p DES to mLDA (10/2014), ischemic CM (25-30%--> 45-50%) and mild dementia who presents to clinic for post hospital follow up after recent admission for afib w/ RVR.   CAD: stable. He has been almost exactly 1 year out from DES in 10/2014.Marland Kitchen We can now discontinue plavix. Will start ASA and continue him on Coumadin for afib with elevated CHADSVASC. Continue BB and statin.   Persistent atrial fibrillation: rate controlled on Cartia and Toprol. HR today 79 bpm. Will stop Cartia and increase Toprol XL 100mg  dialy to BID. CHADSVASC score of at least 5 (CHF 1, HTN 1, Age 63, CAD 1). Continue coumadin. INR  2.4 on 10/14/15. Will have him see coumadin clinic today.   HTN: BP 148/78. He has been on Cartia 240mg  daily, toprol XL 100mg  daily, lisinopril 5mg  dialy. He has not taken his [place] 240mg  today yet since he usually takes that at 3pm. Will stop Cartia and increase Toprol XL 100mg  daily to BID.  HLD: continue statin.   Ischemic CM: EF 25-30%--> 45-50% s/p revascularization last year. Continue BB and ACE. He appears euvolemic off diuretic therapy.   Current medicines are reviewed at length with the patient today.  The patient does not have concerns regarding medicines.  The following changes have been made:  stop Cartia CD and increase Toprol XL 100mg  daily to BID.  Labs/ tests ordered today include:   Orders Placed This Encounter  Procedures  . EKG 12-Lead     Disposition:   FU with me next week to follow HR and BP with med changes.   Renea Ee  10/31/2015 3:10 PM    [facility] [address], [place], [place]  [address] Phone: [phone]; Fax: [phone]

## 2015-10-31 ENCOUNTER — Telehealth: Payer: Self-pay

## 2015-10-31 ENCOUNTER — Ambulatory Visit (INDEPENDENT_AMBULATORY_CARE_PROVIDER_SITE_OTHER): Payer: Medicare Other | Admitting: Physician Assistant

## 2015-10-31 ENCOUNTER — Ambulatory Visit (INDEPENDENT_AMBULATORY_CARE_PROVIDER_SITE_OTHER): Payer: Medicare Other | Admitting: *Deleted

## 2015-10-31 ENCOUNTER — Encounter: Payer: Self-pay | Admitting: Physician Assistant

## 2015-10-31 VITALS — BP 148/78 | HR 79 | Ht 69.0 in | Wt 193.0 lb

## 2015-10-31 DIAGNOSIS — I4891 Unspecified atrial fibrillation: Secondary | ICD-10-CM

## 2015-10-31 DIAGNOSIS — I480 Paroxysmal atrial fibrillation: Secondary | ICD-10-CM | POA: Diagnosis not present

## 2015-10-31 DIAGNOSIS — Z5181 Encounter for therapeutic drug level monitoring: Secondary | ICD-10-CM | POA: Diagnosis not present

## 2015-10-31 LAB — POCT INR: INR: 2.7

## 2015-10-31 MED ORDER — METOPROLOL TARTRATE 100 MG PO TABS: 100.0000 mg | ORAL_TABLET | Freq: Two times a day (BID) | ORAL | Status: DC

## 2015-10-31 NOTE — Patient Instructions (Addendum)
Medication Instructions:  1- Start Metoprolol 100 mg by mouth twice daily. 2- STOP Diltiazem   Labwork: NONE  Testing/Procedures: NONE  Follow-Up: Your physician wants you to follow-up next Monday with Nell Range PA  Your physician has requested that you regularly monitor and record your blood pressure readings at home. Please use the same machine at the same time of day to check your readings and record them to bring to your follow-up visit.  If you need a refill on your cardiac medications before your next appointment, please call your pharmacy.

## 2015-10-31 NOTE — Telephone Encounter (Signed)
[facility] Night - Client [place] [facility] Patient Name: Ricardo Valencia Gender: Male DOB: 1929/07/13 Age: 82 Y 3 M 4 D Return Phone Number: [phone] (Primary), [phone] (Secondary) Address: City/State/Zip: [place] Client [facility] Night - Client Client Site [facility] - Night Physician Carolann Littler Contact Type Call Who Is Calling Patient / Member / Family / Caregiver Call Type Triage / Clinical Caller Name Qwentin Maner Relationship To Patient Spouse Return Phone Number [phone] (Secondary) Chief Complaint Rash - Widespread Reason for Call Symptomatic / Request for [place] states her husband has shingles. He is in a lot of pain. Wanting to know what else he can take. PreDisposition Did not know what to do Translation No Nurse Assessment Nurse: Andreas Blower, RN, Lorriane Shire Date/Time (Eastern Time): 10/29/2015 1:35:24 PM Confirm and document reason for call. If symptomatic, describe symptoms. You must click the next button to save text entered. ---Caller states her husband has shingles. He is in a lot of pain. On tramadol but it's not working. Prescribed once every 6 hrs. Has the patient traveled out of the country within the last 30 days? ---No Does the patient have any new or worsening symptoms? ---Yes Will a triage be completed? ---Yes Related visit to physician within the last 2 weeks? ---Yes Does the PT have any chronic conditions? (i.e. diabetes, asthma, etc.) ---Yes List chronic conditions. ---afib Is this a behavioral health or substance abuse call? ---No Guidelines Guideline Title Affirmed Question Affirmed Notes Nurse Date/Time (Eastern Time) Shingles Fever > 100.5 F (38.1 C) Delamain, RN, Lorriane Shire 10/29/2015 1:39:07 PM Disp. Time Eilene Ghazi Time) Disposition Final User 10/29/2015 1:46:25 PM See Physician within 4 Hours (or  PCP triage) Andreas Blower, RN, Lorriane Shire 10/29/2015 1:55:46 PM Called On-Call Provider Delamain, RN, Lorriane Shire 10/29/2015 2:41:52 PM Pharmacy Call Delamain, RN, Dennison Mascot NOTE: All timestamps contained within this report are represented as [place] Standard Time. CONFIDENTIALTY NOTICE: This fax transmission is intended only for the addressee. It contains information that is legally privileged, confidential or otherwise protected from use or disclosure. If you are not the intended recipient, you are strictly prohibited from reviewing, disclosing, copying using or disseminating any of this information or taking any action in reliance on or regarding this information. If you have received this fax in error, please notify us immediately by telephone so that we can arrange for its return to [place]. Phone: [phone], Toll-Free: [phone], Fax: [phone] Page: 2 of 3 Call Id: [id number] [place]. Time Eilene Ghazi Time) Disposition Final User Reason: Suzie Portela [phone] pham: Raquel Sarna called in gabapentin; needs NPI number 10/29/2015 2:43:44 PM Called On-Call Provider Delamain, RN, Lorriane Shire 10/29/2015 1:46:28 PM See Physician within 4 Hours (or PCP triage) Yes Delamain, RN, Robet Leu Understands: Yes Disagree/Comply: Comply Care Advice Given Per Guideline SEE PHYSICIAN WITHIN 4 HOURS (or PCP triage): * IF OFFICE WILL BE CLOSED AND NO PCP TRIAGE: You need to be seen within the next 3 or 4 hours. A nearby Urgent Care Center is often a good source of care. Another choice is to go to the ER. Go sooner if you become worse. CALL BACK IF: * You become worse. CARE ADVICE given per Shingles (Adult) guideline. Verbal Orders/Maintenance Medications Medication Refill Route Dosage Regime Duration Admin Instructions User Name gabepentin gabapentin Oral 300 mg 3 times a day 10 Days Take up to 3 times a day as directed for pain. Delamain, RN, Lorriane Shire gabapentin Oral 1 pill up to 3 times a  day 10 Days One pill up to 3  times a day as needed for pain Delamain, RN, Lorriane Shire Referrals GO TO FACILITY UNDECIDED GO TO FACILITY UNDECIDED Paging DoctorName Phone DateTime Result/Outcome Message Type Notes Shanon Ace [id number] 10/29/2015 1:55:45 PM Called On Call Provider - Reached Doctor Paged Shanon Ace 10/29/2015 2:33:15 PM Spoke with On Call - General Message Result Dr. Regis Bill advised nurse to call in gabapentin. Wants pt to take medicine up to 3 times a day for shingles pain. Pt should take one the first day, two the second to determine toleration. No further instructions given. Shanon Ace [id number] 10/29/2015 2:43:44 PM Called On Call Provider - Reached Doctor Paged Shanon Ace 10/29/2015 2:44:06 PM Spoke with On Call - Outcome Notification Message Result Got NPI number.

## 2015-11-02 ENCOUNTER — Telehealth: Payer: Self-pay | Admitting: Family Medicine

## 2015-11-02 NOTE — Telephone Encounter (Signed)
Patient is in a lot of pain from Shingles.  He wants to know if there is anything he can get over the counter for the open wombs to ease the pain.

## 2015-11-03 NOTE — Telephone Encounter (Signed)
Left message for pt to call back  °

## 2015-11-03 NOTE — Telephone Encounter (Signed)
There is nothing OTC topical that will help the pain. Make sure he is taking the Gabapentin.  If Tramadol is not working, our other alternative would be to add opioid- but would rather not at his age unless pain is severe- secondary to risk of confusion, sedation, fall risk, constipation, etc.

## 2015-11-04 NOTE — Telephone Encounter (Signed)
Pt wife returning your call. Please call back.

## 2015-11-04 NOTE — Telephone Encounter (Signed)
[name] to aware that he can take gabapentin with Tramadol only 1q6 hours.

## 2015-11-07 ENCOUNTER — Encounter: Payer: Self-pay | Admitting: Physician Assistant

## 2015-11-07 ENCOUNTER — Ambulatory Visit (INDEPENDENT_AMBULATORY_CARE_PROVIDER_SITE_OTHER): Payer: Medicare Other | Admitting: Physician Assistant

## 2015-11-07 VITALS — BP 104/64 | HR 78 | Ht 69.0 in | Wt 196.8 lb

## 2015-11-07 DIAGNOSIS — I482 Chronic atrial fibrillation, unspecified: Secondary | ICD-10-CM

## 2015-11-07 NOTE — Progress Notes (Signed)
Cardiology Office Note   Date:  11/07/2015   ID:  Ricardo Valencia, DOB April 05, 1929, MRN [id number]  PCP:  Ricardo Post, MD  Cardiologist:  Dr. Julianne Valencia   Office follow-up after medication change    History of Present Illness: Ricardo Valencia is a 82 y.o. male with a history of mild dementia, HTN, HLD, PAF on coumadin, CAD s/p DES to mLDA (10/2014), ischemic CM (25-30%--> 45-50%) and mild dementia who presents to clinic for follow-up of atrial fibrillation and recent medication changes.  He was admitted to [facility] in 10/2014 with A. fib with RVR. Echocardiogram obtained during the admission showed EF 25-30% with moderate MR. He underwent cardiac catheterization on 11/10/2014 which showed severe mid LAD stenosis treated with 2.75 x 16 mm Xience DES, moderate disease in RCA. He was initially placed on eliquis and Plavix, eliquis was later changed to Coumadin. He came back and underwent DCCV in 12/2014. Repeat echocardiogram in April showed EF has went back to 45-50%, no significant MR noted. He was last seen by Dr. Angelena Valencia in 8/ 2016, at which time he was back in afib by physical exam but otherwise doing well from cardiology standpoint.  Patient admitted 1/23-1/27/17 for afib with RVR as well as shingles. He presented with dyspnea and dizziness. Also was noted to have vesicular rash right side of face and neck consistent with shingles and started on Valtrex. He had Atrial fibrillation with rapid ventricular response and started on a Cardizem infusion, which was later discontinued and started on Toprol XL 100mg  daily. Coumadin was therapeutic. Patient was discharged home with instructions to discontinue Cartia XT 240 mg but apparently they did not get this message and he continued with that.   He was seen by his PCP, Dr. Elease Valencia for Valencia hospital follow up on 10/20/15. He was still taking both Toprol XL 100mg  and Cartia XT 240mg . His BP was noted to be quite elevated with HR in 90-100bpm and  he was told to continue both medications and follow up with cardiology.  I saw him in the clinic on 10/31/15 for hospital follow-up. He was found to be slightly hypertensive and I stopped his Cartia XT 240mg  and increased Toprol-XL to 100 mg twice a day. We also discontinued his plavix as it had been 1 year out from his stent placement. Today he presents to clinic for follow-up after recent medication changes.   It turns out that he was actually changed to Lopressor 100mg  BID ( i had originally planned for Toprol XL 100mg  BID). He has been keeping a log of his BPs and HRs. Mostly well controlled with SBP 101-159. Today in the office his BP is on the soft side. He is asymptomatic and denies dizziness or fatigue. He is doing quite well except for his shingles which is causing him a lot of Valencia.    Past Medical History  Diagnosis Date  . Allergy     hay fever, allergies  . Hyperlipidemia   . Hypertension   . CAD (coronary artery disease)     DES to mid LAD 11/10/2014  . Atrial fibrillation ([place]) 11/07/2014    Past Surgical History  Procedure Laterality Date  . Appendectomy  1947  . Left heart catheterization with coronary angiogram N/A 11/10/2014    Procedure: LEFT HEART CATHETERIZATION WITH CORONARY ANGIOGRAM;  Surgeon: Wellington Hampshire, MD;  Location: [facility] CATH LAB;  Service: Cardiovascular;  Laterality: N/A;  . Percutaneous coronary stent intervention (pci-s)  11/10/2014  Procedure: PERCUTANEOUS CORONARY STENT INTERVENTION (PCI-S);  Surgeon: Wellington Hampshire, MD;  Location: [facility] CATH LAB;  Service: Cardiovascular;;  . Cardioversion N/A 12/30/2014    Procedure: CARDIOVERSION;  Surgeon: Ricardo Pain, MD;  Location: [facility] ENDOSCOPY;  Service: Cardiovascular;  Laterality: N/A;     Current Outpatient Prescriptions  Medication Sig Dispense Refill  . acetaminophen (TYLENOL) 500 MG tablet Take 500 mg by mouth every 4 (four) hours as needed for fever (Valencia).    . Ascorbic Acid (VITAMIN C) 1000 MG  tablet Take 1,000 mg by mouth daily.    Marland Kitchen atorvastatin (LIPITOR) 40 MG tablet Take 40 mg by mouth daily at 6 PM.    . clopidogrel (PLAVIX) 75 MG tablet Take 1 tablet (75 mg total) by mouth daily with breakfast. 30 tablet 11  . gabapentin (NEURONTIN) 300 MG capsule Take 300 mg by mouth 3 (three) times daily.    Marland Kitchen lisinopril (PRINIVIL,ZESTRIL) 5 MG tablet Take 5 mg by mouth every morning.    . metoprolol (LOPRESSOR) 100 MG tablet Take 1 tablet (100 mg total) by mouth 2 (two) times daily. 180 tablet 3  . traMADol (ULTRAM) 50 MG tablet Take 50 mg by mouth every 6 (six) hours as needed for moderate Valencia.    Marland Kitchen warfarin (COUMADIN) 5 MG tablet Take 5 mg by mouth daily. Or take by mouth daily as directed by the coumadin clinic     No current facility-administered medications for this visit.    Allergies:   Review of patient's allergies indicates no known allergies.    Social History:  The patient  reports that he has never smoked. He has never used smokeless tobacco. He reports that he does not drink alcohol or use illicit drugs.   Family History:  The patient's family history includes Cancer in his father and mother.    ROS:  Please see the history of present illness.   Otherwise, review of systems are positive for NONE.   All other systems are reviewed and negative.    PHYSICAL EXAM: VS:  BP 104/64 mmHg  Pulse 78  Ht 5\' 9"  (1.753 m)  Wt 196 lb 12.8 oz (89.268 kg)  BMI 29.05 kg/m2  SpO2 98% , BMI Body mass index is 29.05 kg/(m^2). GEN: Well nourished, well developed, in no acute distress HEENT: normal Neck: no JVD, carotid bruits, or masses Cardiac: irreg irreg; no murmurs, rubs, or gallops,no edema  Respiratory:  clear to auscultation bilaterally, normal work of breathing GI: soft, nontender, nondistended, + BS MS: no deformity or atrophy Skin: warm and dry, no rash Neuro:  Strength and sensation are intact Psych: euthymic mood, full affect   EKG:  EKG is  ordered today. The ekg  ordered today demonstrates atrial fib with HR 79. Low voltage QRS. ILBBB  Recent Labs: 11/07/2014: TSH 3.733 10/12/2015: ALT 19; Hemoglobin 15.9; Platelets 105* 10/20/2015: BUN 10; Creatinine, Ser 1.12; Potassium 4.1; Sodium 136    Lipid Panel    Component Value Date/Time   CHOL 124 12/10/2014 1043   TRIG 74 12/10/2014 1043   HDL 53 12/10/2014 1043   CHOLHDL 2.3 12/10/2014 1043   VLDL 15 12/10/2014 1043   LDLCALC 56 12/10/2014 1043   LDLDIRECT 191.1 01/04/2011 1218      Wt Readings from Last 3 Encounters:  11/07/15 196 lb 12.8 oz (89.268 kg)  10/31/15 193 lb (87.544 kg)  10/20/15 192 lb 3.2 oz (87.181 kg)      Other studies Reviewed: Additional studies/ records that  were reviewed today include: 2D ECHO Review of the above records demonstrates:   2D ECHO: 01/13/2015 LV EF: 45% -  50% Study Conclusions - Left ventricle: The cavity size was normal. Wall thickness was increased in a pattern of mild LVH. Systolic function was mildly reduced. The estimated ejection fraction was in the range of 45% to 50%. Images were inadequate for LV wall motion assessment. The study is not technically sufficient to allow evaluation of LV diastolic function. - Left atrium: Moderately dilated at 45 ml/m2. - Right atrium: Severely dilated at 30 cm2. Impressions: - LVEF 45-50%, global hypokinesis with regional variation, Moderate LAE and severe RAE. Compared to the prior study in 10/2014, LV function has improved from 25-30% up to current value.    ASSESSMENT AND PLAN: Ricardo Valencia is a 82 y.o. male with a history of mild dementia, HTN, HLD, PAF on coumadin, CAD s/p DES to mLDA (10/2014), ischemic CM (25-30%--> 45-50%) and mild dementia who presents to clinic for follow-up of atrial fibrillation and recent medication changes.  CAD: stable. He has been almost exactly 1 year out from DES in 10/2014.Marland KitchenPlavix recently discontinued as it has been over a year since stent placement.  He is on Coumadin for afib with elevated CHADSVASC. Continue BB and statin.   Persistent atrial fibrillation: rate controlled on Lopressor 100mg  BID. HR today 78 bpm. CHADSVASC score of at least 5 (CHF 1, HTN 1, Age 25, CAD 1). Continue coumadin. INR 2.7 on 08/29/16. Followed in our coumadin clinic.   HTN: BP 104/64. This is much better controlled on Lopressor 100mg  BID and lisinopril 5mg  dialy. If he drops too low, we can decrease his Lopressor   HLD: continue statin.   Ischemic CM: EF 25-30%--> 45-50% s/p revascularization last year. Continue BB and ACE. He appears euvolemic off diuretic therapy.   Current medicines are reviewed at length with the patient today.  The patient does not have concerns regarding medicines.  The following changes have been made: none  Labs/ tests ordered today include:   No orders of the defined types were placed in this encounter.     Disposition:   FU with Dr Ricardo Valencia in 3 months.   Renea Ee  11/07/2015 2:56 PM    [facility] [place], [place], [place]  [address] Phone: [phone]; Fax: [phone]

## 2015-11-07 NOTE — Patient Instructions (Signed)
Medication Instructions:  Your physician recommends that you continue on your current medications as directed. Please refer to the Current Medication list given to you today.   Labwork: None ordered  Testing/Procedures: None ordered  Follow-Up: Your physician recommends that you schedule a follow-up appointment in: 3 months with Dr.Mcalhany   Any Other Special Instructions Will Be Listed Below (If Applicable).     If you need a refill on your cardiac medications before your next appointment, please call your pharmacy.

## 2015-11-08 ENCOUNTER — Telehealth: Payer: Self-pay | Admitting: Family Medicine

## 2015-11-08 NOTE — Telephone Encounter (Signed)
Please call pt and set up a "follow up for shingles pain" this week to discuss pain medication treatments.

## 2015-11-08 NOTE — Telephone Encounter (Signed)
Pt has been scheduled tomorrow,  Wed @ 2:30 pm

## 2015-11-08 NOTE — Telephone Encounter (Signed)
Patient wife stated that the medication patient is taking for shingles Gabapentin and Tramadol is not taking the pain away, (he is in a lot of pain) could you please call in some Hydrocodne.

## 2015-11-08 NOTE — Telephone Encounter (Signed)
Obviously, Hydrocodone cannot be called in.  I recommend he and his wife come in for follow up.  We need to have a good discussion of risk and benefits of any pain meds.  We will need to consider other pain meds if pain severe, but they will need to be away of the substantial risks and potential side effects of any stronger pain medications.

## 2015-11-09 ENCOUNTER — Encounter: Payer: Self-pay | Admitting: Family Medicine

## 2015-11-09 ENCOUNTER — Ambulatory Visit (INDEPENDENT_AMBULATORY_CARE_PROVIDER_SITE_OTHER): Payer: Medicare Other | Admitting: Family Medicine

## 2015-11-09 VITALS — BP 110/80 | HR 89 | Temp 97.9°F | Ht 69.0 in | Wt 198.0 lb

## 2015-11-09 DIAGNOSIS — B029 Zoster without complications: Secondary | ICD-10-CM

## 2015-11-09 MED ORDER — HYDROCODONE-ACETAMINOPHEN 5-325 MG PO TABS: 1.0000 | ORAL_TABLET | Freq: Four times a day (QID) | ORAL | Status: DC | PRN

## 2015-11-09 NOTE — Progress Notes (Signed)
Pre visit review using our clinic review tool, if applicable. No additional management support is needed unless otherwise documented below in the visit note. 

## 2015-11-09 NOTE — Progress Notes (Signed)
   Subjective:    Patient ID: Ricardo Valencia, male    DOB: February 19, 1929, 82 y.o.   MRN: [id number]  HPI  Acute Visit Patient seen today with complaint of neuropathic pain from recent shingles outbreak times 3 weeks.  Past medical history hypertension, atrial fibrillation, and hyperlipidemia. Patient describes experiencing a shooting to stabbing like pain to right half of lower face, right neck, right ear, right lower hairline of the head. Pain is rated 7-8/10 with minimal relief with current pain regimen of  Gabapentin 300 mg three times daily Tramadol 50 mg and Tylenol 500 mg as needed.  Review of Systems  Constitutional: Negative.   HENT:       See HPI  Eyes: Negative.   Respiratory: Negative.   Cardiovascular: Negative.   Gastrointestinal: Negative.   Endocrine: Negative.   Genitourinary: Negative.   Musculoskeletal: Negative.   Allergic/Immunologic: Negative.   Neurological:       See HPI  Hematological: Negative.   Psychiatric/Behavioral: Negative.        Objective:   Physical Exam  Constitutional: He is oriented to person, place, and time. He appears well-developed and well-nourished.  HENT:  Head: Normocephalic and atraumatic.  Right Ear: External ear normal.  Left Ear: External ear normal.  Nose: Nose normal.  Eyes: Conjunctivae and EOM are normal. Pupils are equal, round, and reactive to light.  Neck: Normal range of motion. Neck supple.  Cardiovascular: Normal rate, regular rhythm, normal heart sounds and intact distal pulses.   Pulmonary/Chest: Effort normal and breath sounds normal.  Musculoskeletal: Normal range of motion.  Neurological: He is alert and oriented to person, place, and time. He has normal reflexes.  Skin:  Below hair line of neck, right mandible, right upper neck: Erythema and couple of small open areas of skin  Right helix region of ear: small area of weeping.  No cellulitis changes.  Psychiatric: He has a normal mood and affect. His  behavior is normal. Judgment and thought content normal.          Assessment & Plan:  1. Neuropathic pain unrelieved with current medication regimen. Shingles lesions to face appear to healing   however, neuropathic pain remains uncontrolled.  Plan: Continue Gabapentin 300 mg, three times daily. Start Hydrocodone/Acetominophen 5-325 mg 1-2 every six hours as needed for pain #60.  STOP Tramadol- which has not helped much Start over the counter stool softener, 2 tablets twice daily to prevent constipation. We reviewed potential side effects of Hydrocodone including constipation, confusion, increased risks for falls- but pt states his pain is still 8/10 frequently and interfering with sleep and frequently severe throughout the day.

## 2015-11-09 NOTE — Patient Instructions (Signed)
Take 2 tablets stool softener daily. Stop Tramadol.      Neuropathic Pain Neuropathic pain is pain caused by damage to the nerves that are responsible for certain sensations in your body (sensory nerves). The pain can be caused by damage to:   The sensory nerves that send signals to your spinal cord and brain (peripheral nervous system).  The sensory nerves in your brain or spinal cord (central nervous system). Neuropathic pain can make you more sensitive to pain. What would be a minor sensation for most people may feel very painful if you have neuropathic pain. This is usually a long-term condition that can be difficult to treat. The type of pain can differ from person to person. It may start suddenly (acute), or it may develop slowly and last for a long time (chronic). Neuropathic pain may come and go as damaged nerves heal or may stay at the same level for years. It often causes emotional distress, loss of sleep, and a lower quality of life. CAUSES  The most common cause of damage to a sensory nerve is diabetes. Many other diseases and conditions can also cause neuropathic pain. Causes of neuropathic pain can be classified as:  Toxic. Many drugs and chemicals can cause toxic damage. The most common cause of toxic neuropathic pain is damage from drug treatment for cancer (chemotherapy).  Metabolic. This type of pain can happen when a disease causes imbalances that damage nerves. Diabetes is the most common of these diseases. Vitamin B deficiency caused by long-term alcohol abuse is another common cause.  Traumatic. Any injury that cuts, crushes, or stretches a nerve can cause damage and pain. A common example is feeling pain after losing an arm or leg (phantom limb pain).  Compression-related. If a sensory nerve gets trapped or compressed for a long period of time, the blood supply to the nerve can be cut off.  Vascular. Many blood vessel diseases can cause neuropathic pain by decreasing  blood supply and oxygen to nerves.  Autoimmune. This type of pain results from diseases in which the body's defense system mistakenly attacks sensory nerves. Examples of autoimmune diseases that can cause neuropathic pain include lupus and multiple sclerosis.  Infectious. Many types of viral infections can damage sensory nerves and cause pain. Shingles infection is a common cause of this type of pain.  Inherited. Neuropathic pain can be a symptom of many diseases that are passed down through families (genetic). SIGNS AND SYMPTOMS  The main symptom is pain. Neuropathic pain is often described as:  Burning.  Shock-like.  Stinging.  Hot or cold.  Itching. DIAGNOSIS  No single test can diagnose neuropathic pain. Your health care provider will do a physical exam and ask you about your pain. You may use a pain scale to describe how bad your pain is. You may also have tests to see if you have a high sensitivity to pain and to help find the cause and location of any sensory nerve damage. These tests may include:  Imaging studies, such as:  X-rays.  CT scan.  MRI.  Nerve conduction studies to test how well nerve signals travel through your sensory nerves (electrodiagnostic testing).  Stimulating your sensory nerves through electrodes on your skin and measuring the response in your spinal cord and brain (somatosensory evoked potentials). TREATMENT  Treatment for neuropathic pain may change over time. You may need to try different treatment options or a combination of treatments. Some options include:  Over-the-counter pain relievers.  Prescription medicines.  Some medicines used to treat other conditions may also help neuropathic pain. These include medicines to:  Control seizures (anticonvulsants).  Relieve depression (antidepressants).  Prescription-strength pain relievers (narcotics). These are usually used when other pain relievers do not help.  Transcutaneous nerve stimulation  (TENS). This uses electrical currents to block painful nerve signals. The treatment is painless.  Topical and local anesthetics. These are medicines that numb the nerves. They can be injected as a nerve block or applied to the skin.  Alternative treatments, such as:  Acupuncture.  Meditation.  Massage.  Physical therapy.  Pain management programs.  Counseling. HOME CARE INSTRUCTIONS  Learn as much as you can about your condition.  Take medicines only as directed by your health care provider.  Work closely with all your health care providers to find what works best for you.  Have a good support system at home.  Consider joining a chronic pain support group. SEEK MEDICAL CARE IF:  Your pain treatments are not helping.  You are having side effects from your medicines.  You are struggling with fatigue, mood changes, depression, or anxiety.   This information is not intended to replace advice given to you by your health care provider. Make sure you discuss any questions you have with your health care provider.   Document Released: 05/31/2004 Document Revised: 09/24/2014 Document Reviewed: 02/11/2014 Elsevier Interactive Patient Education [employer].

## 2015-11-15 ENCOUNTER — Other Ambulatory Visit: Payer: Self-pay | Admitting: Physician Assistant

## 2015-11-16 NOTE — Telephone Encounter (Signed)
Plavix was never discontinued from medication list from [facility] 11/07/2015 W/ Angelena Form, PA however in her assessment and plan, it says it was discontinued. Please advise.  ASSESSMENT AND PLAN: Ricardo Valencia is a 83 y.o. male with a history of mild dementia, HTN, HLD, PAF on coumadin, CAD s/p DES to mLDA (10/2014), ischemic CM (25-30%--> 45-50%) and mild dementia who presents to clinic for follow-up of atrial fibrillation and recent medication changes.  CAD: stable. He has been almost exactly 1 year out from DES in 10/2014.Marland KitchenPlavix recently discontinued as it has been over a year since stent placement. He is on Coumadin for afib with elevated CHADSVASC. Continue BB and statin.   Persistent atrial fibrillation: rate controlled on Lopressor 100mg  BID. HR today 78 bpm. CHADSVASC score of at least 5 (CHF 1, HTN 1, Age 36, CAD 1). Continue coumadin. INR 2.7 on 08/29/16. Followed in our coumadin clinic.   HTN: BP 104/64. This is much better controlled on Lopressor 100mg  BID and lisinopril 5mg  dialy. If he drops too low, we can decrease his Lopressor   HLD: continue statin.   Ischemic CM: EF 25-30%--> 45-50% s/p revascularization last year. Continue BB and ACE. He appears euvolemic off diuretic therapy.   Current medicines are reviewed at length with the patient today. The patient does not have concerns regarding medicines.  The following changes have been made: none  Labs/ tests ordered today include:   No orders of the defined types were placed in this encounter.    Disposition: FU with Dr Julianne Handler in 3 months.   Renea Ee  11/07/2015 2:56 PM  [facility] [place], [place], [place] [address] Phone: [phone]; Fax: [phone]

## 2015-11-17 ENCOUNTER — Observation Stay (HOSPITAL_COMMUNITY)
Admission: EM | Admit: 2015-11-17 | Discharge: 2015-11-19 | Disposition: A | Discharge status: Home / Self Care | Payer: Medicare Other | Attending: Cardiovascular Disease | Admitting: Cardiovascular Disease

## 2015-11-17 ENCOUNTER — Emergency Department (HOSPITAL_COMMUNITY): Payer: Medicare Other

## 2015-11-17 ENCOUNTER — Encounter (HOSPITAL_COMMUNITY): Payer: Self-pay | Admitting: Emergency Medicine

## 2015-11-17 DIAGNOSIS — F0390 Unspecified dementia without behavioral disturbance: Secondary | ICD-10-CM | POA: Diagnosis not present

## 2015-11-17 DIAGNOSIS — I2510 Atherosclerotic heart disease of native coronary artery without angina pectoris: Secondary | ICD-10-CM | POA: Diagnosis not present

## 2015-11-17 DIAGNOSIS — I4891 Unspecified atrial fibrillation: Secondary | ICD-10-CM | POA: Diagnosis not present

## 2015-11-17 DIAGNOSIS — I5022 Chronic systolic (congestive) heart failure: Secondary | ICD-10-CM | POA: Diagnosis present

## 2015-11-17 DIAGNOSIS — R079 Chest pain, unspecified: Secondary | ICD-10-CM

## 2015-11-17 DIAGNOSIS — I509 Heart failure, unspecified: Secondary | ICD-10-CM

## 2015-11-17 DIAGNOSIS — I481 Persistent atrial fibrillation: Secondary | ICD-10-CM | POA: Diagnosis not present

## 2015-11-17 DIAGNOSIS — B029 Zoster without complications: Secondary | ICD-10-CM | POA: Diagnosis present

## 2015-11-17 DIAGNOSIS — R0789 Other chest pain: Secondary | ICD-10-CM | POA: Diagnosis not present

## 2015-11-17 DIAGNOSIS — E785 Hyperlipidemia, unspecified: Secondary | ICD-10-CM | POA: Diagnosis not present

## 2015-11-17 DIAGNOSIS — Z7901 Long term (current) use of anticoagulants: Secondary | ICD-10-CM | POA: Diagnosis not present

## 2015-11-17 DIAGNOSIS — I5023 Acute on chronic systolic (congestive) heart failure: Secondary | ICD-10-CM | POA: Diagnosis not present

## 2015-11-17 DIAGNOSIS — I10 Essential (primary) hypertension: Secondary | ICD-10-CM | POA: Diagnosis present

## 2015-11-17 DIAGNOSIS — I4819 Other persistent atrial fibrillation: Secondary | ICD-10-CM | POA: Diagnosis present

## 2015-11-17 DIAGNOSIS — I5043 Acute on chronic combined systolic (congestive) and diastolic (congestive) heart failure: Secondary | ICD-10-CM | POA: Diagnosis not present

## 2015-11-17 DIAGNOSIS — I255 Ischemic cardiomyopathy: Secondary | ICD-10-CM | POA: Diagnosis not present

## 2015-11-17 DIAGNOSIS — I110 Hypertensive heart disease with heart failure: Secondary | ICD-10-CM | POA: Diagnosis not present

## 2015-11-17 DIAGNOSIS — Z955 Presence of coronary angioplasty implant and graft: Secondary | ICD-10-CM | POA: Insufficient documentation

## 2015-11-17 DIAGNOSIS — R0602 Shortness of breath: Secondary | ICD-10-CM | POA: Diagnosis not present

## 2015-11-17 HISTORY — DX: Other persistent atrial fibrillation: I48.19

## 2015-11-17 HISTORY — DX: Chronic systolic (congestive) heart failure: I50.22

## 2015-11-17 LAB — CBC WITH DIFFERENTIAL/PLATELET
BASOS PCT: 1 %
Basophils Absolute: 0 10*3/uL (ref 0.0–0.1)
EOS ABS: 0.2 10*3/uL (ref 0.0–0.7)
EOS PCT: 3 %
HCT: 39.9 % (ref 39.0–52.0)
HEMOGLOBIN: 13.5 g/dL (ref 13.0–17.0)
LYMPHS ABS: 1.3 10*3/uL (ref 0.7–4.0)
Lymphocytes Relative: 17 %
MCH: 29.9 pg (ref 26.0–34.0)
MCHC: 33.8 g/dL (ref 30.0–36.0)
MCV: 88.3 fL (ref 78.0–100.0)
MONO ABS: 0.6 10*3/uL (ref 0.1–1.0)
MONOS PCT: 8 %
Neutro Abs: 5.2 10*3/uL (ref 1.7–7.7)
Neutrophils Relative %: 71 %
PLATELETS: 216 10*3/uL (ref 150–400)
RBC: 4.52 MIL/uL (ref 4.22–5.81)
RDW: 16.1 % — AB (ref 11.5–15.5)
WBC: 7.3 10*3/uL (ref 4.0–10.5)

## 2015-11-17 LAB — COMPREHENSIVE METABOLIC PANEL
ALBUMIN: 3 g/dL — AB (ref 3.5–5.0)
ALT: 43 U/L (ref 17–63)
AST: 40 U/L (ref 15–41)
Alkaline Phosphatase: 88 U/L (ref 38–126)
Anion gap: 9 (ref 5–15)
BILIRUBIN TOTAL: 1 mg/dL (ref 0.3–1.2)
BUN: 8 mg/dL (ref 6–20)
CALCIUM: 8.4 mg/dL — AB (ref 8.9–10.3)
CHLORIDE: 105 mmol/L (ref 101–111)
CO2: 21 mmol/L — AB (ref 22–32)
CREATININE: 1.14 mg/dL (ref 0.61–1.24)
GFR calc Af Amer: >60 mL/min (ref >60)
GFR calc non Af Amer: 57 mL/min — ABNORMAL LOW (ref >60)
GLUCOSE: 107 mg/dL — AB (ref 65–99)
POTASSIUM: 4 mmol/L (ref 3.5–5.1)
Sodium: 135 mmol/L (ref 135–145)
Total Protein: 6.5 g/dL (ref 6.5–8.1)

## 2015-11-17 LAB — I-STAT TROPONIN, ED: TROPONIN I, POC: 0.06 ng/mL (ref 0.00–0.08)

## 2015-11-17 LAB — PROTIME-INR
INR: 2.45 — AB (ref 0.00–1.49)
PROTHROMBIN TIME: 26.3 s — AB (ref 11.6–15.2)

## 2015-11-17 LAB — BRAIN NATRIURETIC PEPTIDE: B NATRIURETIC PEPTIDE 5: 629.5 pg/mL — AB (ref 0.0–100.0)

## 2015-11-17 MED ORDER — ATORVASTATIN CALCIUM 40 MG PO TABS
40.0000 mg | ORAL_TABLET | Freq: Every day | ORAL | Status: DC
  Administered 2015-11-17 – 2015-11-18 (×2): 40 mg via ORAL
  Filled 2015-11-17 (×2): qty 1

## 2015-11-17 MED ORDER — ACETAMINOPHEN 325 MG PO TABS: 650.0000 mg | ORAL_TABLET | ORAL | Status: DC | PRN

## 2015-11-17 MED ORDER — ONDANSETRON HCL 4 MG/2ML IJ SOLN: 4.0000 mg | Freq: Four times a day (QID) | INTRAMUSCULAR | Status: DC | PRN

## 2015-11-17 MED ORDER — SODIUM CHLORIDE 0.9 % IV SOLN: 250.0000 mL | INTRAVENOUS | Status: DC | PRN

## 2015-11-17 MED ORDER — SODIUM CHLORIDE 0.9% FLUSH
3.0000 mL | Freq: Two times a day (BID) | INTRAVENOUS | Status: DC
  Administered 2015-11-17 – 2015-11-18 (×4): 3 mL via INTRAVENOUS

## 2015-11-17 MED ORDER — FUROSEMIDE 10 MG/ML IJ SOLN
40.0000 mg | Freq: Once | INTRAMUSCULAR | Status: AC
  Administered 2015-11-17: 40 mg via INTRAVENOUS
  Filled 2015-11-17: qty 4

## 2015-11-17 MED ORDER — WARFARIN SODIUM 5 MG PO TABS
5.0000 mg | ORAL_TABLET | Freq: Once | ORAL | Status: AC
  Administered 2015-11-17: 5 mg via ORAL
  Filled 2015-11-17 (×2): qty 1

## 2015-11-17 MED ORDER — WARFARIN - PHARMACIST DOSING INPATIENT
Freq: Every day | Status: DC
Start: 2015-11-17 — End: 2015-11-19
  Administered 2015-11-18: 1

## 2015-11-17 MED ORDER — DILTIAZEM HCL 30 MG PO TABS
30.0000 mg | ORAL_TABLET | Freq: Four times a day (QID) | ORAL | Status: DC
  Administered 2015-11-17 – 2015-11-19 (×7): 30 mg via ORAL
  Filled 2015-11-17 (×12): qty 1

## 2015-11-17 MED ORDER — SODIUM CHLORIDE 0.9% FLUSH: 3.0000 mL | INTRAVENOUS | Status: DC | PRN

## 2015-11-17 MED ORDER — ACETAMINOPHEN 325 MG PO TABS
650.0000 mg | ORAL_TABLET | Freq: Once | ORAL | Status: AC
  Administered 2015-11-17: 650 mg via ORAL
  Filled 2015-11-17: qty 2

## 2015-11-17 MED ORDER — METOPROLOL TARTRATE 100 MG PO TABS
100.0000 mg | ORAL_TABLET | Freq: Two times a day (BID) | ORAL | Status: DC
  Administered 2015-11-17 – 2015-11-19 (×5): 100 mg via ORAL
  Filled 2015-11-17 (×2): qty 1
  Filled 2015-11-17: qty 4
  Filled 2015-11-17 (×2): qty 1

## 2015-11-17 MED ORDER — LISINOPRIL 5 MG PO TABS
5.0000 mg | ORAL_TABLET | Freq: Every day | ORAL | Status: DC
  Administered 2015-11-17 – 2015-11-19 (×3): 5 mg via ORAL
  Filled 2015-11-17 (×2): qty 1

## 2015-11-17 MED ORDER — CLOPIDOGREL BISULFATE 75 MG PO TABS
75.0000 mg | ORAL_TABLET | Freq: Every day | ORAL | Status: DC
  Administered 2015-11-17 – 2015-11-18 (×2): 75 mg via ORAL
  Filled 2015-11-17: qty 1

## 2015-11-17 MED ORDER — POTASSIUM CHLORIDE CRYS ER 20 MEQ PO TBCR
20.0000 meq | EXTENDED_RELEASE_TABLET | Freq: Once | ORAL | Status: AC
  Administered 2015-11-17: 20 meq via ORAL
  Filled 2015-11-17: qty 1

## 2015-11-17 NOTE — ED Notes (Signed)
Called 3E.  They stated about 20 more min until room ready and no need to re-give report b/c RN Deneise Lever will give report to oncoming shift.

## 2015-11-17 NOTE — ED Notes (Signed)
Ordered pt a 2 gram/carb modified lunch tray.

## 2015-11-17 NOTE — ED Provider Notes (Signed)
CSN: [id number]     Arrival date & time 11/17/15  0522 History   First MD Initiated Contact with Patient 11/17/15 0550     Chief Complaint  Patient presents with  . Shortness of Breath  . Chest Pain     (Consider location/radiation/quality/duration/timing/severity/associated sxs/prior Treatment) HPI Comments: Patient is an 82 year old male with past medical history of hypertension, atrial fibrillation, coronary artery disease with stent placement last year, and ischemic cardiomyopathy. He presents for evaluation of pressure in his chest and difficulty breathing that started approximately 3 hours prior to presentation. He states that this woke him from sleep. He denies any fevers or chills. He denies any nausea or diaphoresis. He denies any radiation to the arm or jaw.  The patient is a patient of Dr. Julianne Handler from Cardiology.  Patient is a 82 y.o. male presenting with shortness of breath. The history is provided by the patient.  Shortness of Breath Severity:  Moderate Onset quality:  Sudden Duration:  3 hours Timing:  Constant Progression:  Unchanged Chronicity:  New Context: not URI   Relieved by:  Nothing Worsened by:  Nothing tried Ineffective treatments:  None tried   Past Medical History  Diagnosis Date  . Allergy     hay fever, allergies  . Hyperlipidemia   . Hypertension   . CAD (coronary artery disease)     DES to mid LAD 11/10/2014  . Atrial fibrillation ([place]) 11/07/2014   Past Surgical History  Procedure Laterality Date  . Appendectomy  1947  . Left heart catheterization with coronary angiogram N/A 11/10/2014    Procedure: LEFT HEART CATHETERIZATION WITH CORONARY ANGIOGRAM;  Surgeon: Wellington Hampshire, MD;  Location: [facility] CATH LAB;  Service: Cardiovascular;  Laterality: N/A;  . Percutaneous coronary stent intervention (pci-s)  11/10/2014    Procedure: PERCUTANEOUS CORONARY STENT INTERVENTION (PCI-S);  Surgeon: Wellington Hampshire, MD;  Location[facility] CATH LAB;  Service:  Cardiovascular;;  . Cardioversion N/A 12/30/2014    Procedure: CARDIOVERSION;  Surgeon: Jerline Pain, MD;  Location: [facility] ENDOSCOPY;  Service: Cardiovascular;  Laterality: N/A;   Family History  Problem Relation Age of Onset  . Cancer Mother     breast  . Cancer Father     colon   Social History  Substance Use Topics  . Smoking status: Never Smoker   . Smokeless tobacco: Never Used  . Alcohol Use: No    Review of Systems  Respiratory: Positive for shortness of breath.   All other systems reviewed and are negative.     Allergies  Review of patient's allergies indicates no known allergies.  Home Medications   Prior to Admission medications   Medication Sig Start Date End Date Taking? Authorizing Provider  acetaminophen (TYLENOL) 500 MG tablet Take 500 mg by mouth every 4 (four) hours as needed for fever (pain).   Yes Historical Provider, MD  Ascorbic Acid (VITAMIN C) 1000 MG tablet Take 1,000 mg by mouth daily.   Yes Historical Provider, MD  atorvastatin (LIPITOR) 40 MG tablet Take 40 mg by mouth daily at 6 PM.   Yes Historical Provider, MD  clopidogrel (PLAVIX) 75 MG tablet TAKE ONE TABLET BY MOUTH ONCE DAILY WITH BREAKFAST 11/16/15  Yes Burnell Blanks, MD  lisinopril (PRINIVIL,ZESTRIL) 5 MG tablet Take 5 mg by mouth every morning.   Yes Historical Provider, MD  lisinopril (PRINIVIL,ZESTRIL) 5 MG tablet TAKE ONE TABLET BY MOUTH ONCE DAILY 11/16/15  Yes Burnell Blanks, MD  metoprolol (LOPRESSOR) 100 MG tablet  Take 1 tablet (100 mg total) by mouth 2 (two) times daily. 10/31/15  Yes Eileen Stanford, PA-C  traMADol (ULTRAM) 50 MG tablet Take 50 mg by mouth every 6 (six) hours as needed for moderate pain.   Yes Historical Provider, MD  warfarin (COUMADIN) 5 MG tablet Take 5-7.5 mg by mouth daily. Take 5mg  everyday except Friday take 7.5mg    Yes Historical Provider, MD   BP 141/106 mmHg  Pulse 107  Temp(Src) 97.7 F (36.5 C) (Oral)  Resp 21  Ht 5\' 9"  (1.753 m)  Wt  192 lb (87.091 kg)  BMI 28.34 kg/m2  SpO2 99% Physical Exam  Constitutional: He is oriented to person, place, and time. He appears well-developed and well-nourished. No distress.  HENT:  Head: Normocephalic and atraumatic.  Neck: Normal range of motion. Neck supple.  Cardiovascular:  Heart is irregularly irregular and somewhat rapid.  Pulmonary/Chest: Effort normal and breath sounds normal. No respiratory distress. He has no wheezes. He has no rales.  Abdominal: Soft. Bowel sounds are normal. He exhibits no distension. There is no tenderness.  Musculoskeletal: Normal range of motion. He exhibits no edema.  Lymphadenopathy:    He has no cervical adenopathy.  Neurological: He is alert and oriented to person, place, and time.  Skin: Skin is warm and dry. He is not diaphoretic.  Nursing note and vitals reviewed.   ED Course  Procedures (including critical care time) Labs Review Labs Reviewed  CBC WITH DIFFERENTIAL/PLATELET - Abnormal; Notable for the following:    RDW 16.1 (*)    All other components within normal limits  COMPREHENSIVE METABOLIC PANEL  BRAIN NATRIURETIC PEPTIDE  I-STAT TROPOININ, ED    Imaging Review Dg Chest 2 View  11/17/2015  CLINICAL DATA:  Acute onset of shortness of breath. Initial encounter. EXAM: CHEST  2 VIEW COMPARISON:  Chest radiograph performed 10/10/2015 FINDINGS: The lungs are well-aerated. Vascular congestion is noted. Increased interstitial markings may reflect mild interstitial edema. Mild bibasilar atelectasis is noted. There is no evidence of pleural effusion or pneumothorax. The heart is borderline enlarged. No acute osseous abnormalities are seen. IMPRESSION: Vascular congestion and borderline cardiomegaly. Increased interstitial markings may reflect mild interstitial edema. Mild bibasilar atelectasis noted. Electronically Signed   By: Garald Balding M.D.   On: 11/17/2015 06:25   I have personally reviewed and evaluated these images and lab  results as part of my medical decision-making.   EKG Interpretation   Date/Time:  Thursday November 17 2015 05:33:55 EST Ventricular Rate:  113 PR Interval:    QRS Duration: 93 QT Interval:  356 QTC Calculation: 488 R Axis:   130 Text Interpretation:  Atrial fibrillation Abnormal lateral Q waves  Anterior infarct, old Confirmed by Charistopher Rumble  MD, Gale Klar ([id number]) on 11/17/2015  6:11:13 AM      MDM   Final diagnoses:  None    Patient with a history of coronary artery disease with stent 1 year ago. He has been having intermittent episodes of atrial fibrillation and chest discomfort since that time. Today he presents with another episode of palpitations, tightness in his chest and shortness of breath. His workup reveals an elevated BNP and findings concerning for mild CHF on his chest x-ray. He will be given IV Lasix and cardiology has been consulted. They will evaluate the patient and determine the final disposition.    Veryl Speak, MD 11/17/15 [phone]

## 2015-11-17 NOTE — ED Notes (Signed)
Attempted report 

## 2015-11-17 NOTE — ED Notes (Signed)
Floor called, room is ready.

## 2015-11-17 NOTE — ED Notes (Signed)
Cardiology at bedside.

## 2015-11-17 NOTE — ED Notes (Signed)
Pt presents from home with increasing SOB that woke him up from sleep at 0300; pt reports pressure on chest "that feels like I cant breathe as easy"

## 2015-11-17 NOTE — ED Notes (Signed)
Patient transported to X-ray 

## 2015-11-17 NOTE — ED Notes (Signed)
Pt c/o NV; continually drinking water and requesting food. Informed pt to be NPO if nauseated

## 2015-11-17 NOTE — Progress Notes (Signed)
ANTICOAGULATION CONSULT NOTE - Initial Consult  Pharmacy Consult for Coumadin  Indication: atrial fibrillation  No Known Allergies  Patient Measurements: Height: 5\' 9"  (175.3 cm) Weight: 194 lb (87.998 kg) IBW/kg (Calculated) : 70.7  Vital Signs: Temp: 97.7 F (36.5 C) (03/02 0532) Temp Source: Oral (03/02 0532) BP: 141/120 mmHg (03/02 1215) Pulse Rate: 131 (03/02 1215)  Labs:  Recent Labs  11/17/15 0555 11/17/15 1105  HGB 13.5  --   HCT 39.9  --   PLT 216  --   LABPROT  --  26.3*  INR  --  2.45*  CREATININE 1.14  --     Estimated Creatinine Clearance: 52.9 mL/min (by C-G formula based on Cr of 1.14).   Medical History: Past Medical History  Diagnosis Date  . Allergy     hay fever, allergies  . Hyperlipidemia   . Hypertension   . CAD (coronary artery disease)     DES to mid LAD 11/10/2014  . Atrial fibrillation ([place]) 11/07/2014  . Chronic systolic heart failure (HCC)      Assessment: 86 YOM admitted 11/17/2015 with dyspnea and Afib w/ RVR. Pharmacy consulted to dose coumadin for AF. PT on Coumadin PTA for AFib. INR on admit 2.45. Pt also on plavix for recent stent placement.  PTA dose = 7.5mg  on Friday and 5mg  daily all other days  Hgb 13.5, PLT 216, no bleeding noted   Goal of Therapy:  INR 2-3 Monitor platelets by anticoagulation protocol: Yes   Plan:  Coumadin 5mg  x1 tonight  Daily INR/CBC  Monitor for s/s of bleeding    Eveleigh Crumpler C. Lennox Grumbles, PharmD Pharmacy Resident  Pager: [phone] 11/17/2015 1:17 PM

## 2015-11-17 NOTE — H&P (Signed)
Cardiologist: Angelena Form Chief Complaint: dyspnea HPI:  Ricardo Valencia is an 82 y.o. Male with a history of mild dementia, HTN, HLD, PAF on coumadin, CAD s/p DES to mLDA (10/2014), ischemic CM (25-30%--> 45-50%) and mild dementia.   He was admitted to [facility] in 10/2014 with A. fib with RVR. Echocardiogram obtained during the admission showed EF 25-30% with moderate MR. He underwent cardiac catheterization on 11/10/2014 which showed severe mid LAD stenosis treated with 2.75 x 16 mm Xience DES, moderate disease in RCA. He was initially placed on eliquis and Plavix, eliquis was later changed to Coumadin. He came back and underwent DCCV in 12/2014. Repeat echocardiogram in April showed EF has went back to 45-50%, no significant MR noted. He was last seen by Dr. Angelena Form in 8/ 2016, at which time he was back in afib by physical exam but otherwise doing well from cardiology standpoint.  Patient admitted 1/23-1/27/17 for afib with RVR as well as shingles. He presented with dyspnea and dizziness. Also was noted to have vesicular rash right side of face and neck consistent with shingles and started on Valtrex. He had Atrial fibrillation with rapid ventricular response and started on a Cardizem infusion, which was later discontinued and started on Toprol XL 126m daily. Coumadin was therapeutic. Patient was discharged home with instructions to discontinue Cartia XT 240 mg but apparently they did not get this message and he continued with that.   He was seen by his PCP, Dr. BElease Hashimotofor post hospital follow up on 10/20/15. He was still taking both Toprol XL 1055mand Cartia XT 24084mHis BP was noted to be quite elevated with HR in 90-100bpm and he was told to continue both medications and follow up with cardiology.  Ms. ThoGrandville SilosA-C saw him in the clinic on 10/31/15 for hospital follow-up. He was found to be slightly hypertensive and I stopped his Cartia XT 240m75md increased lopressor to 100 mg twice a day. We  also discontinued his plavix as it had been 1 year out from his stent placement.   Patient presents with dyspnea and A. fib RVR.  Patient reports he woke up short of breath. Worse when he lays down. He did have some soup last night.  His weight on thyroid 20th in our office was 196 pounds and here is 194.  He still is healing from shingles.  The patient currently denies nausea, vomiting, fever, chest pain, dizziness, cough, congestion, abdominal pain, hematochezia, melena, lower extremity edema, claudication.   Past Medical History  Diagnosis Date  . Allergy     hay fever, allergies  . Hyperlipidemia   . Hypertension   . CAD (coronary artery disease)     DES to mid LAD 11/10/2014  . Atrial fibrillation (HCC)[place]21/2016    Past Surgical History  Procedure Laterality Date  . Appendectomy  1947  . Left heart catheterization with coronary angiogram N/A 11/10/2014    Procedure: LEFT HEART CATHETERIZATION WITH CORONARY ANGIOGRAM;  Surgeon: MuhaWellington Hampshire;  [facility];  Service: Cardiovascular;  Laterality: N/A;  . Percutaneous coronary stent intervention (pci-s)  11/10/2014    Procedure: PERCUTANEOUS CORONARY STENT INTERVENTION (PCI-S);  Surgeon: MuhaWellington Hampshire;  [facility];  Service: Cardiovascular;;  . Cardioversion N/A 12/30/2014    Procedure: CARDIOVERSION;  Surgeon: MarkJerline Pain;  [facility];  Service: Cardiovascular;  Laterality: N/A;    Family History  Problem Relation Age of Onset  . Cancer  Mother     breast  . Cancer Father     colon   Social History:  reports that he has never smoked. He has never used smokeless tobacco. He reports that he does not drink alcohol or use illicit drugs.  Allergies: No Known Allergies   (Not in a hospital admission)  Results for orders placed or performed during the hospital encounter of 11/17/15 (from the past 48 hour(s))  Comprehensive metabolic panel     Status: Abnormal   Collection Time:  11/17/15  5:55 AM  Result Value Ref Range   Sodium 135 135 - 145 mmol/L   Potassium 4.0 3.5 - 5.1 mmol/L   Chloride 105 101 - 111 mmol/L   CO2 21 (L) 22 - 32 mmol/L   Glucose, Bld 107 (H) 65 - 99 mg/dL   BUN 8 6 - 20 mg/dL   Creatinine, Ser 5.19 0.61 - 1.24 mg/dL   Calcium 8.4 (L) 8.9 - 10.3 mg/dL   Total Protein 6.5 6.5 - 8.1 g/dL   Albumin 3.0 (L) 3.5 - 5.0 g/dL   AST 40 15 - 41 U/L   ALT 43 17 - 63 U/L   Alkaline Phosphatase 88 38 - 126 U/L   Total Bilirubin 1.0 0.3 - 1.2 mg/dL   GFR calc non Af Amer 57 (L) >60 mL/min   GFR calc Af Amer >60 >60 mL/min    Comment: (NOTE) The eGFR has been calculated using the CKD EPI equation. This calculation has not been validated in all clinical situations. eGFR's persistently <60 mL/min signify possible Chronic Kidney Disease.    Anion gap 9 5 - 15  CBC with Differential     Status: Abnormal   Collection Time: 11/17/15  5:55 AM  Result Value Ref Range   WBC 7.3 4.0 - 10.5 K/uL   RBC 4.52 4.22 - 5.81 MIL/uL   Hemoglobin 13.5 13.0 - 17.0 g/dL   HCT 82.4 29.9 - 80.6 %   MCV 88.3 78.0 - 100.0 fL   MCH 29.9 26.0 - 34.0 pg   MCHC 33.8 30.0 - 36.0 g/dL   RDW 99.9 (H) 67.2 - 27.7 %   Platelets 216 150 - 400 K/uL   Neutrophils Relative % 71 %   Neutro Abs 5.2 1.7 - 7.7 K/uL   Lymphocytes Relative 17 %   Lymphs Abs 1.3 0.7 - 4.0 K/uL   Monocytes Relative 8 %   Monocytes Absolute 0.6 0.1 - 1.0 K/uL   Eosinophils Relative 3 %   Eosinophils Absolute 0.2 0.0 - 0.7 K/uL   Basophils Relative 1 %   Basophils Absolute 0.0 0.0 - 0.1 K/uL  Brain natriuretic peptide     Status: Abnormal   Collection Time: 11/17/15  5:55 AM  Result Value Ref Range   B Natriuretic Peptide 629.5 (H) 0.0 - 100.0 pg/mL  I-stat troponin, ED     Status: None   Collection Time: 11/17/15  5:58 AM  Result Value Ref Range   Troponin i, poc 0.06 0.00 - 0.08 ng/mL   Comment 3            Comment: Due to the release kinetics of cTnI, a negative result within the first  hours of the onset of symptoms does not rule out myocardial infarction with certainty. If myocardial infarction is still suspected, repeat the test at appropriate intervals.    Dg Chest 2 View  11/17/2015  CLINICAL DATA:  Acute onset of shortness of breath. Initial encounter. EXAM: CHEST  2 VIEW COMPARISON:  Chest radiograph performed 10/10/2015 FINDINGS: The lungs are well-aerated. Vascular congestion is noted. Increased interstitial markings may reflect mild interstitial edema. Mild bibasilar atelectasis is noted. There is no evidence of pleural effusion or pneumothorax. The heart is borderline enlarged. No acute osseous abnormalities are seen. IMPRESSION: Vascular congestion and borderline cardiomegaly. Increased interstitial markings may reflect mild interstitial edema. Mild bibasilar atelectasis noted. Electronically Signed   By: Garald Balding M.D.   On: 11/17/2015 06:25    Review of Systems  Constitutional: Negative for fever and diaphoresis.  HENT: Negative for congestion, nosebleeds and sore throat.   Respiratory: Positive for shortness of breath. Negative for cough.   Cardiovascular: Positive for orthopnea and PND. Negative for chest pain and leg swelling.  Gastrointestinal: Negative for nausea, vomiting, abdominal pain, blood in stool and melena.  Genitourinary: Negative for hematuria.  Musculoskeletal: Negative for myalgias.  Skin: Positive for rash (right side of neck, ear, head).  Neurological: Negative for dizziness.  All other systems reviewed and are negative.   Blood pressure 147/97, pulse 104, temperature 97.7 F (36.5 C), temperature source Oral, resp. rate 29, height '5\' 9"'$  (1.753 m), weight 194 lb (87.998 kg), SpO2 98 %. Physical Exam  Nursing note and vitals reviewed. Constitutional: He is oriented to person, place, and time. He appears well-developed and well-nourished. No distress.  HENT:  Head: Normocephalic and atraumatic.  Eyes: EOM are normal. Pupils are  equal, round, and reactive to light.  Neck: Normal range of motion. JVD present.  Cardiovascular: S1 normal and S2 normal.  An irregularly irregular rhythm present. Tachycardia present.   No murmur heard. Pulses:      Radial pulses are 2+ on the right side, and 2+ on the left side.       Dorsalis pedis pulses are 2+ on the right side, and 2+ on the left side.  Respiratory: Effort normal. He has wheezes. He has rales.  GI: Soft. Bowel sounds are normal. He exhibits no distension. There is no tenderness.  Musculoskeletal: He exhibits edema (Trace LEEE).  Neurological: He is alert and oriented to person, place, and time. He exhibits normal muscle tone.  Skin: Skin is warm and dry.  Psychiatric: He has a normal mood and affect.     Assessment/Plan Principal Problem:   Acute on chronic systolic heart failure, NYHA class 2 (HCC) Active Problems:   Hyperlipidemia   Atrial fibrillation with rapid ventricular response (HCC)   CAD (coronary artery disease)   Ischemic cardiomyopathy   Herpes zoster  82 y.o. Male with a history of mild dementia, HTN, HLD, chronic AF on coumadin, CAD s/p DES to mLDA (10/2014), ischemic CM (25-30%--> 45-50%) and mild dementia.   He's had problem with shingles for several weeks. Presents with orthopnea. BNP is 629.   Chest x-ray shows vascular congestion and mild edema. His A. fib rate is rapid. We'll admit him and continue IV diuresis however, he is not grossly volume overloaded and shouldn't need to lose much fluid. Hopefully we can switch to by mouth in the morning and discharge him.   This suspected this tachycardia is likely driven by hypoxemia due to acute heart failure.  Continue home medications which includes Lopressor 100 mg twice daily.  He was on Cardizem 240 mg up until his office visit 2/20, at which time, it was discontinued. At that time his heart rate was well controlled. We'll restart him at 120 mg.  CHADSVASC 5  continue Coumadin.  Continue Tylenol for  shingles pain, which the patient requested however, he was taken hydrocodone acetaminophen at home along with gabapentin, which does not appear on his med list.    Tarri Fuller, PA-C 11/17/2015, 9:57 AM   Patient seen and examined. Agree with assessment and plan. Mr Zayin Valadez is an 82 year old Caucasian gentleman who has a history of hypertension, hyperlipidemia, artery disease, and a prior history of an ischemic cardiopathy with an ejection fraction of 25-30%, improving to 45-50% following DES stenting to his mid LAD in February 2016.  The patient has a history of atrial fibrillation and has been on Coumadin anticoagulation.  He has been in persistent and possibly permanent atrial fibrillation.  When he was recently seen in the office, he was taken off long-acting Cardizem 240 mg and although the plan was to increase his Toprol-XL to 100 mg twice a day.  He apparently was started on metoprolol tartrate 100 mg twice a day.  He presented to the emergency room today with increasing shortness of breath, worse with supine posture.  He admitted to having soup last night containing sodium.  He had a recent exacerbation of single shingles and is now in the latter stages of improvement.  He denies any recurrent anginal symptoms.  Prior to January, his last prior ECG had been in April 2016 at which time he was in sinus rhythm.  In the emergency room, he has been given IV Lasix with brisk diuresis.  His ventricular rate is in the 90s to low 100s in atrial fibrillation.  JVD is 8 cm.  He had decreased breath sounds without wheezing.  Rhythm was irregularly irregular and tachycardic with faint 1/6 systolic murmur.  Abdomen was soft, nontender, with good bowel sounds.  There is trivial peripheral edema.  Neurologic exam is grossly nonfocal.  At present, will reinitiate Cardizem at 30 mg every 6 hours today and will continue with IV diuresis twice a day today.  Patient has been taking his metoprolol, tartrate in the  morning and afternoon and if he is to stay on this therapy would change this to every 12 hours.  Alternatively, this can be changed to long-acting metoprolol succinate.  A 2-D echo Doppler study will be obtained today to reassess LV function.    Troy Sine, MD, [facility] 11/17/2015 10:51 AM

## 2015-11-18 ENCOUNTER — Encounter (HOSPITAL_COMMUNITY): Payer: Self-pay | Admitting: *Deleted

## 2015-11-18 ENCOUNTER — Observation Stay (HOSPITAL_COMMUNITY): Payer: Medicare Other

## 2015-11-18 DIAGNOSIS — I5023 Acute on chronic systolic (congestive) heart failure: Secondary | ICD-10-CM | POA: Diagnosis not present

## 2015-11-18 DIAGNOSIS — I4891 Unspecified atrial fibrillation: Secondary | ICD-10-CM | POA: Diagnosis not present

## 2015-11-18 DIAGNOSIS — B029 Zoster without complications: Secondary | ICD-10-CM

## 2015-11-18 DIAGNOSIS — I255 Ischemic cardiomyopathy: Secondary | ICD-10-CM

## 2015-11-18 LAB — BASIC METABOLIC PANEL
Anion gap: 14 (ref 5–15)
BUN: 12 mg/dL (ref 6–20)
CHLORIDE: 101 mmol/L (ref 101–111)
CO2: 22 mmol/L (ref 22–32)
Calcium: 8.5 mg/dL — ABNORMAL LOW (ref 8.9–10.3)
Creatinine, Ser: 1.17 mg/dL (ref 0.61–1.24)
GFR calc Af Amer: >60 mL/min (ref >60)
GFR calc non Af Amer: 55 mL/min — ABNORMAL LOW (ref >60)
GLUCOSE: 85 mg/dL (ref 65–99)
POTASSIUM: 5.2 mmol/L — AB (ref 3.5–5.1)
Sodium: 137 mmol/L (ref 135–145)

## 2015-11-18 LAB — PROTIME-INR
INR: 2.21 — ABNORMAL HIGH (ref 0.00–1.49)
Prothrombin Time: 24.3 seconds — ABNORMAL HIGH (ref 11.6–15.2)

## 2015-11-18 MED ORDER — ASPIRIN 81 MG PO CHEW
81.0000 mg | CHEWABLE_TABLET | Freq: Every day | ORAL | Status: DC
  Administered 2015-11-18 – 2015-11-19 (×2): 81 mg via ORAL
  Filled 2015-11-18 (×2): qty 1

## 2015-11-18 MED ORDER — FUROSEMIDE 80 MG PO TABS
80.0000 mg | ORAL_TABLET | Freq: Two times a day (BID) | ORAL | Status: DC
  Administered 2015-11-18: 80 mg via ORAL
  Filled 2015-11-18 (×2): qty 1

## 2015-11-18 NOTE — Progress Notes (Signed)
[facility] clinician stated that she did not have time to complete the [facility] today, and that it would be performed tomorrow, 3.4.17.

## 2015-11-18 NOTE — Progress Notes (Signed)
Patient Name: Ricardo Valencia Date of Encounter: 11/18/2015  Principal Problem:   Acute on chronic systolic heart failure, NYHA class 2 ([place]) Active Problems:   Hyperlipidemia   Atrial fibrillation with rapid ventricular response ([place])   CAD (coronary artery disease)   Ischemic cardiomyopathy   Herpes zoster   Primary Cardiologist: Dr Ricardo Valencia  Patient Profile: 82 y.o. Male with a history of HTN, HLD, PAF on coumadin, CAD s/p DES to mLDA (10/2014), ischemic CM (25-30%--> 45-50%) and mild dementia. Admitted 03/02 w/ SOB and afib/RVR  SUBJECTIVE: Breathing much better, not sure what his dry weight is, does not feel irreg HR.   OBJECTIVE Filed Vitals:   11/17/15 2349 11/18/15 0551 11/18/15 0628 11/18/15 0820  BP: 138/89 133/85  148/90  Pulse: 98 90  102  Temp: 98.5 F (36.9 C) 98.5 F (36.9 C)  98.3 F (36.8 C)  TempSrc: Oral Oral  Oral  Resp: 18 18  16   Height:      Weight:   184 lb 3.2 oz (83.553 kg)   SpO2: 97% 97%  95%    Intake/Output Summary (Last 24 hours) at 11/18/15 1048 Last data filed at 11/18/15 1022  Gross per 24 hour  Intake    847 ml  Output   2050 ml  Net  -1203 ml   Filed Weights   11/17/15 0905 11/17/15 2015 11/18/15 0628  Weight: 194 lb (87.998 kg) 188 lb 6.4 oz (85.458 kg) 184 lb 3.2 oz (83.553 kg)    PHYSICAL EXAM General: Well developed, well nourished, male in no acute distress. Head: Normocephalic, atraumatic.  Neck: Supple without bruits, JVD 9 cm, + hepatojugular reflux. Lungs:  Resp regular and unlabored, decreased BS bases w/ a few rales. Heart: Irreg R&R, S1, S2, no S3, S4, or murmur; no rub. Abdomen: Soft, non-tender, non-distended, BS + x 4.  Extremities: No clubbing, cyanosis, edema.  Neuro: Alert and oriented X 2. Moves all extremities spontaneously. Psych: Normal affect.  LABS: CBC:  Recent Labs  11/17/15 0555  WBC 7.3  NEUTROABS 5.2  HGB 13.5  HCT 39.9  MCV 88.3  PLT 216   INR:  Recent Labs   11/18/15 0721  INR [id number]*   Basic Metabolic Panel: Recent Labs  11/17/15 0555 11/18/15 0721  NA 135 137  K 4.0 5.2* (H)  CL 105 101  CO2 21* 22  GLUCOSE 107* 85  BUN 8 12  CREATININE 1.14 1.17  CALCIUM 8.4* 8.5*   Liver Function Tests:  Recent Labs  11/17/15 0555  AST 40  ALT 43  ALKPHOS 88  BILITOT 1.0  PROT 6.5  ALBUMIN 3.0*    Recent Labs  11/17/15 0558  TROPIPOC 0.06   BNP:  B NATRIURETIC PEPTIDE  Date/Time Value Ref Range Status  11/17/2015 05:55 AM 629.5* 0.0 - 100.0 pg/mL Final  11/07/2014 07:07 AM 464.5* 0.0 - 100.0 pg/mL Final    TELE:  Atrial fib, occ PVC      Radiology/Studies: Dg Chest 2 View  11/17/2015  CLINICAL DATA:  Acute onset of shortness of breath. Initial encounter. EXAM: CHEST  2 VIEW COMPARISON:  Chest radiograph performed 10/10/2015 FINDINGS: The lungs are well-aerated. Vascular congestion is noted. Increased interstitial markings may reflect mild interstitial edema. Mild bibasilar atelectasis is noted. There is no evidence of pleural effusion or pneumothorax. The heart is borderline enlarged. No acute osseous abnormalities are seen. IMPRESSION: Vascular congestion and borderline cardiomegaly. Increased interstitial markings may reflect mild interstitial edema. Mild  bibasilar atelectasis noted. Electronically Signed   By: Ricardo Valencia M.D.   On: 11/17/2015 06:25     Current Medications:  . atorvastatin  40 mg Oral q1800  . clopidogrel  75 mg Oral Daily  . diltiazem  30 mg Oral 4 times per day  . lisinopril  5 mg Oral Daily  . metoprolol  100 mg Oral BID  . sodium chloride flush  3 mL Intravenous Q12H  . Warfarin - Pharmacist Dosing Inpatient   Does not apply q1800      ASSESSMENT AND PLAN: Principal Problem: 1.  Acute on chronic systolic heart failure, NYHA class 2 ([place]) - rec'd 2 doses IV Lasix yesterday w/ improvement in resp status - will give 1 more dose of IV Lasix, then restart PO rx.  Active Problems: 2.   Hyperlipidemia - on statin  3.  Atrial fibrillation with rapid ventricular response (HCC) - low dose Diltiazem added to home metoprolol w/ improvement in rate - however, w/ LVD, Cardizem may not be best option - MD advise on dc Cardizem and increase metoprolol - could also add digoxin  4. CAD (coronary artery disease) - no ongoing ischemic sx  5. Ischemic cardiomyopathy - on ACE/BB/statin - MD advise on spironolactone  7.  Herpes zoster - still w/ open lesions, reason for airborne precautions - prn rx for pain.    Ricardo Valencia 10:48 AM 11/18/2015   Patient seen and examined. Agree with assessment and plan. I/O -1103 since admission. Wt 194 -->184.  Breathing much better.   HR improved with initiation of low dose cardizem.  Echo to re-assess LV fxn; if remains similar to last at 45-50% of to continue cardizem and titrate as HR allows. INR 2.1 on warfarin.   Ricardo Sine, MD, [facility] 11/18/2015 12:05 PM

## 2015-11-18 NOTE — Care Management Obs Status (Signed)
[facility] NOTIFICATION   Patient Details  Name: IQUAN SEDAM MRN: [id number] Date of Birth: 11-26-1928   Medicare Observation Status Notification Given:  Yes    CrutchfieldAntony Haste, RN 11/18/2015, 3:46 PM

## 2015-11-18 NOTE — Progress Notes (Signed)
ANTICOAGULATION CONSULT NOTE  Pharmacy Consult for Coumadin  Indication: atrial fibrillation  No Known Allergies  Patient Measurements: Height: 5\' 9"  (175.3 cm) Weight: 184 lb 3.2 oz (83.553 kg) IBW/kg (Calculated) : 70.7  Vital Signs: Temp: 98.3 F (36.8 C) (03/03 0820) Temp Source: Oral (03/03 0820) BP: 148/90 mmHg (03/03 0820) Pulse Rate: 102 (03/03 0820)  Labs:  Recent Labs  11/17/15 0555 11/17/15 1105 11/18/15 0721  HGB 13.5  --   --   HCT 39.9  --   --   PLT 216  --   --   LABPROT  --  26.3* 24.3*  INR  --  2.45* 2.21*  CREATININE 1.14  --  1.17    Estimated Creatinine Clearance: 47 mL/min (by C-G formula based on Cr of 1.17).  Assessment: 82 yo male presenting with dyspnea   PMH: dementia, HTN, HLD, AF on warfarin, CAD s/p DES (2/16), iCM AC: Coumadin PTA for permanent AFib. INR 2.21                PTA dose = 7.5mg  on Friday and 5mg  daily all other days CV: LHC with DES 2/16 - plavix should be D/C (noted in H&P). ECHO pending? Renal: SCr 1.17, K 5.2 AC: H&H 13.5/39.9, Plt 216 Goal of Therapy:  INR 2-3 Monitor platelets by anticoagulation protocol: Yes   Plan:  Coumadin 7.5mg  x1 tonight  Daily INR/CBC Spoke with Dr. Claiborne Billings to d/c plavix  Monitor for s/sx of bleeding   Levester Fresh, PharmD, BCPS, [facility] Clinical Pharmacist Pager [phone] 11/18/2015 10:01 AM

## 2015-11-18 NOTE — Discharge Instructions (Signed)

## 2015-11-19 ENCOUNTER — Encounter (HOSPITAL_COMMUNITY): Payer: Self-pay | Admitting: Physician Assistant

## 2015-11-19 DIAGNOSIS — I4819 Other persistent atrial fibrillation: Secondary | ICD-10-CM | POA: Diagnosis present

## 2015-11-19 DIAGNOSIS — I2510 Atherosclerotic heart disease of native coronary artery without angina pectoris: Secondary | ICD-10-CM | POA: Diagnosis not present

## 2015-11-19 DIAGNOSIS — B029 Zoster without complications: Secondary | ICD-10-CM | POA: Diagnosis not present

## 2015-11-19 DIAGNOSIS — I5023 Acute on chronic systolic (congestive) heart failure: Secondary | ICD-10-CM | POA: Diagnosis not present

## 2015-11-19 DIAGNOSIS — I4891 Unspecified atrial fibrillation: Secondary | ICD-10-CM | POA: Diagnosis not present

## 2015-11-19 LAB — CBC
HEMATOCRIT: 44 % (ref 39.0–52.0)
HEMOGLOBIN: 14.4 g/dL (ref 13.0–17.0)
MCH: 29.1 pg (ref 26.0–34.0)
MCHC: 32.7 g/dL (ref 30.0–36.0)
MCV: 89.1 fL (ref 78.0–100.0)
Platelets: 231 10*3/uL (ref 150–400)
RBC: 4.94 MIL/uL (ref 4.22–5.81)
RDW: 16.4 % — ABNORMAL HIGH (ref 11.5–15.5)
WBC: 7.6 10*3/uL (ref 4.0–10.5)

## 2015-11-19 LAB — BASIC METABOLIC PANEL
Anion gap: 12 (ref 5–15)
BUN: 12 mg/dL (ref 6–20)
CHLORIDE: 99 mmol/L — AB (ref 101–111)
CO2: 28 mmol/L (ref 22–32)
CREATININE: 1.13 mg/dL (ref 0.61–1.24)
Calcium: 8.8 mg/dL — ABNORMAL LOW (ref 8.9–10.3)
GFR calc Af Amer: >60 mL/min (ref >60)
GFR calc non Af Amer: 58 mL/min — ABNORMAL LOW (ref >60)
Glucose, Bld: 84 mg/dL (ref 65–99)
POTASSIUM: 3.8 mmol/L (ref 3.5–5.1)
Sodium: 139 mmol/L (ref 135–145)

## 2015-11-19 LAB — PROTIME-INR
INR: 2.46 — AB (ref 0.00–1.49)
Prothrombin Time: 26.3 seconds — ABNORMAL HIGH (ref 11.6–15.2)

## 2015-11-19 MED ORDER — DILTIAZEM HCL ER COATED BEADS 120 MG PO CP24: 120.0000 mg | ORAL_CAPSULE | Freq: Every day | ORAL | Status: DC

## 2015-11-19 MED ORDER — POTASSIUM CHLORIDE ER 10 MEQ PO TBCR: 10.0000 meq | EXTENDED_RELEASE_TABLET | Freq: Every day | ORAL | Status: DC

## 2015-11-19 MED ORDER — FUROSEMIDE 40 MG PO TABS: 40.0000 mg | ORAL_TABLET | Freq: Every day | ORAL | Status: DC

## 2015-11-19 MED ORDER — WARFARIN SODIUM 5 MG PO TABS: 5.0000 mg | ORAL_TABLET | Freq: Once | ORAL | Status: DC

## 2015-11-19 MED ORDER — FUROSEMIDE 40 MG PO TABS
40.0000 mg | ORAL_TABLET | Freq: Every day | ORAL | Status: DC
  Administered 2015-11-19: 40 mg via ORAL
  Filled 2015-11-19: qty 1

## 2015-11-19 NOTE — Progress Notes (Addendum)
Cardiologist: Angelena Form Subjective:  Feels great, would like to go home. Has lost water weight. No chest pain, no shortness of breath. Atrial fibrillation under better control with reinitiation of diltiazem as he was on previously.  Objective:  Vital Signs in the last 24 hours: Temp:  [97.9 F (36.6 C)-98.6 F (37 C)] 98.6 F (37 C) (03/04 0619) Pulse Rate:  [79-108] 108 (03/04 0619) Resp:  [16-18] 18 (03/04 0619) BP: (128-150)/(71-93) 150/91 mmHg (03/04 0619) SpO2:  [97 %-99 %] 97 % (03/04 0619) Weight:  [182 lb 9.6 oz (82.827 kg)] 182 lb 9.6 oz (82.827 kg) (03/04 0619)  Intake/Output from previous day: 03/03 0701 - 03/04 0700 In: 894 [P.O.:894] Out: 750 [Urine:750]   Physical Exam: General: Well developed, well nourished, in no acute distress. Head:  Normocephalic and atraumatic. Bleeding noted over zoster site Lungs: Clear to auscultation and percussion. Heart: Irregularly irregular, normal rate  No murmur, rubs or gallops.  Abdomen: soft, non-tender, positive bowel sounds. Extremities: No clubbing or cyanosis. No edema. Neurologic: Alert and oriented x 3.    Lab Results:  Recent Labs  11/17/15 0555 11/19/15 0604  WBC 7.3 7.6  HGB 13.5 14.4  PLT 216 231    Recent Labs  11/18/15 0721 11/19/15 0604  NA 137 139  K 5.2* 3.8  CL 101 99*  CO2 22 28  GLUCOSE 85 84  BUN 12 12  CREATININE 1.17 1.13   No results for input(s): TROPONINI in the last 72 hours.  Invalid input(s): CK, MB Hepatic Function Panel  Recent Labs  11/17/15 0555  PROT 6.5  ALBUMIN 3.0*  AST 40  ALT 43  ALKPHOS 88  BILITOT 1.0   Telemetry: Atrial fibrillation with heart rate ranging between 80 and 100 Personally viewed.   EKG:  113 heart rate, atrial fibrillation on 11/17/15 at 5 AM Personally viewed.  Cardiac Studies:  Echocardiogram previously EF 45% improved from 25%.   Meds: Scheduled Meds: . aspirin  81 mg Oral Daily  . atorvastatin  40 mg Oral q1800  . diltiazem   120 mg Oral Daily  . furosemide  80 mg Oral BID  . lisinopril  5 mg Oral Daily  . metoprolol  100 mg Oral BID  . sodium chloride flush  3 mL Intravenous Q12H  . warfarin  5 mg Oral ONCE-1800  . Warfarin - Pharmacist Dosing Inpatient   Does not apply q1800   Continuous Infusions:  PRN Meds:.sodium chloride, acetaminophen, ondansetron (ZOFRAN) IV, sodium chloride flush  Assessment/Plan:  Principal Problem:   Acute on chronic systolic heart failure, NYHA class 2 (HCC) Active Problems:   Hyperlipidemia   Atrial fibrillation with rapid ventricular response (H[facility])   CAD (coronary artery disease)   Ischemic cardiomyopathy   Herpes zoster  Acute on chronic systolic/diastolic heart failure -Weight 182 pounds, on admission 194 pounds -Excellent diuresis -We will place him on Lasix 40 mg by mouth daily, was not on at home. -Check basic metabolic profile at clinic follow-up. -Continue with metoprolol -I'm comfortable with him using the diltiazem since his ejection fraction had improved previously. He is not showing any signs of worsening heart failure after reinitiating diltiazem. -Continue with ACE inhibitor  Atrial fibrillation persistent -Currently on anticoagulation with warfarin -Diltiazem has been added back. Previously he was on diltiazem CD 240 mg in addition to his Toprol however Toprol was increased at last clinic visit and his diltiazem was stopped. Because he is having some increased heart rates, diltiazem was re-added and  we will continue with 120 mg CD once a day. -He came back and underwent DCCV in 12/2014. Repeat echocardiogram in April showed EF has went back to 45-50%, no significant MR noted.  Herpes zoster -Per primary physician. Valacyclovir.  Coronary artery disease -s/p DES to mLDA (10/2014) 2.75 x 16 mm Xience DES, moderate disease in RCA  He is eager to go home. He is feeling well. We will discharge. If he does not get echocardiogram here in the hospital this is  fine. He can always get evaluated in the outpatient setting. Close follow-up in 1-2 weeks.   SKAINS, [place] 11/19/2015, 10:03 AM

## 2015-11-19 NOTE — Discharge Summary (Signed)
Discharge Summary    Patient ID: SETHE Valencia,  MRN: [id number], DOB/AGE: [birth year] 82 y.o.  Admit date: 11/17/2015 Discharge date: 11/19/2015  Primary Care Provider: [facility] Primary Cardiologist: Dr. Julianne Handler   Discharge Diagnoses    Principal Problem:   Acute on chronic systolic heart failure, NYHA class 2 ([place])   Atrial fibrillation with rapid ventricular response (HCC) Active Problems:   Hypertension   Hyperlipidemia   CAD (coronary artery disease)   Ischemic cardiomyopathy   Herpes zoster   Persistent atrial fibrillation (H[facility])   Allergies No Known Allergies   History of Present Illness     Ricardo Valencia is a 82 y.o. male with a history of mild dementia, HTN, HLD, persistant afib on coumadin, CAD s/p DES to mLDA (10/2014), ischemic CM (25-30%--> 45-50%) and mild dementia who presented to [facility] on 11/17/15 for dyspnea and found to be in afib with RVR and acute on chronic CHF.   He was admitted to [facility] in 10/2014 with A. fib with RVR. Echocardiogram obtained during Ricardo admission showed EF 25-30% with moderate MR. He underwent cardiac catheterization on 11/10/2014 which showed severe mid LAD stenosis treated with 2.75 x 16 mm Xience DES, moderate disease in RCA. He was initially placed on eliquis and Plavix, eliquis was later changed to Coumadin. He came back and underwent DCCV in 12/2014. Repeat echocardiogram in April showed EF has went back to 45-50%, no significant MR noted. He was last seen by Dr. Angelena Form in 04/2015, at which time he was back in afib by physical exam but otherwise doing well from cardiology standpoint.  Patient admitted 1/23-1/27/17 for afib with RVR as well as shingles. He presented with dyspnea and dizziness. Also was noted to have vesicular rash right side of face and neck consistent with shingles and started on Valtrex. He had Atrial fibrillation with rapid ventricular response and started on a Cardizem infusion, which was later discontinued  and started on Toprol XL 100mg  daily. Coumadin was therapeutic. Patient was discharged home with instructions to discontinue Cartia XT 240 mg but apparently they did not get this message and he continued with that.   He was seen by his PCP, Dr. [name] for post hospital follow up on 10/20/15. He was still taking both Toprol XL 100mg  and Cartia XT 240mg . His BP was noted to be quite elevated with HR in 90-100bpm and he was told to continue both medications and follow up with cardiology.  I saw him in Ricardo clinic on 10/31/15 for hospital follow-up. He was found to be slightly hypertensive and I stopped his Cartia XT 240mg  and increased Toprol-XL to 100 mg twice a day (however lopressor 100mg  BID prescribed.) We also discontinued his plavix as it had been 1 year out from his stent placement.   He presented to [facility] on 11/17/15 with dyspnea and found to be in afib with RVR and was admitted. He was also found to be volume overloaded with pulm vasc congestion on CXR.    Hospital Course     Consultants: none  Acute on chronic combined systolic/diastolic heart failure -Weight 182 pounds, on admission 194 pounds -Excellent diuresis -We will place him on Lasix 40 mg by mouth daily for discharge, was not on at home. -Check basic metabolic profile at clinic follow-up. Also repeat 2D ECHO -He was restarted on Cardizem CD 120mg  daily for rate control of afib. Dr Marlou Porch felt comfortable with him using Ricardo diltiazem since his ejection fraction had improved previously. He  did no show any signs of worsening heart failure after reinitiating diltiazem. -Continue with ACE inhibitor and BB.   Atrial fibrillation persistent: admitted with RVR -Currently on anticoagulation with warfarin,  CHADSVASC score of at least 5 (CHF 1, HTN 1, Age 82, CAD 1) -Diltiazem has been added back. Previously he was on diltiazem CD 240 mg in addition to his Toprol however Toprol was changes to Lopressor 100mg  BID at last clinic visit and his  diltiazem was stopped. Because he was having some increased heart rates, diltiazem was re-added and we will continue with 120 mg CD once a day.  Herpes zoster -Per primary physician. Valacyclovir.  Coronary artery disease -s/p DES to mLDA (10/2014) 2.75 x 16 mm Xience DES, moderate disease in RCA. Stable off plavix. Continue ASA and coumadin.   Ricardo patient has had an uncomplicated hospital course and is recovering well. He has been seen by Dr. Courtney Heys today and deemed ready for discharge home. A staff message has been sent to arrange follow-up appointments. Discharge medications are listed below.  _____________  Discharge Vitals Blood pressure 150/91, pulse 108, temperature 98.6 F (37 C), temperature source Oral, resp. rate 18, height 5\' 9"  (1.753 m), weight 182 lb 9.6 oz (82.827 kg), SpO2 97 %.  Filed Weights   11/17/15 2015 11/18/15 0628 11/19/15 0619  Weight: 188 lb 6.4 oz (85.458 kg) 184 lb 3.2 oz (83.553 kg) 182 lb 9.6 oz (82.827 kg)    Labs & Radiologic Studies     CBC  Recent Labs  11/17/15 0555 11/19/15 0604  WBC 7.3 7.6  NEUTROABS 5.2  --   HGB 13.5 14.4  HCT 39.9 44.0  MCV 88.3 89.1  PLT 216 [id number]   Basic Metabolic Panel  Recent Labs  11/18/15 0721 11/19/15 0604  NA 137 139  K 5.2* 3.8  CL 101 99*  CO2 22 28  GLUCOSE 85 84  BUN 12 12  CREATININE 1.17 1.13  CALCIUM 8.5* 8.8*   Liver Function Tests  Recent Labs  11/17/15 0555  AST 40  ALT 43  ALKPHOS 88  BILITOT 1.0  PROT 6.5  ALBUMIN 3.0*     Dg Chest 2 View  11/17/2015  CLINICAL DATA:  Acute onset of shortness of breath. Initial encounter. EXAM: CHEST  2 VIEW COMPARISON:  Chest radiograph performed 10/10/2015 FINDINGS: Ricardo lungs are well-aerated. Vascular congestion is noted. Increased interstitial markings may reflect mild interstitial edema. Mild bibasilar atelectasis is noted. There is no evidence of pleural effusion or pneumothorax. Ricardo heart is borderline enlarged. No acute osseous  abnormalities are seen. IMPRESSION: Vascular congestion and borderline cardiomegaly. Increased interstitial markings may reflect mild interstitial edema. Mild bibasilar atelectasis noted. Electronically Signed   By: Garald Balding M.D.   On: 11/17/2015 06:25     Diagnostic Studies/Procedures    none _____________    Disposition   Pt is being discharged home today in good condition.  Follow-up Plans & Appointments    Follow-up Information    Follow up with [facility], MD.   Specialty:  Cardiology   Why:  Ricardo office will call you to make an appoinment., If you do not hear from them, please contact them., You should be seen within 1-2 weeks by one of Dr. Camillia Herter PAs or NPs   Contact information:   [address] [place] [address] [phone]        Discharge Medications   Current Discharge Medication List    START taking these medications  Details  diltiazem (CARDIZEM CD) 120 MG 24 hr capsule Take 1 capsule (120 mg total) by mouth daily. Qty: 30 capsule, Refills: 6    furosemide (LASIX) 40 MG tablet Take 1 tablet (40 mg total) by mouth daily. Qty: 30 tablet, Refills: 6    potassium chloride (K-DUR) 10 MEQ tablet Take 1 tablet (10 mEq total) by mouth daily. Qty: 30 tablet, Refills: 6      CONTINUE these medications which have NOT CHANGED   Details  acetaminophen (TYLENOL) 500 MG tablet Take 500 mg by mouth every 4 (four) hours as needed for fever (pain).    Ascorbic Acid (VITAMIN C) 1000 MG tablet Take 1,000 mg by mouth daily.    atorvastatin (LIPITOR) 40 MG tablet Take 40 mg by mouth daily at 6 PM.    lisinopril (PRINIVIL,ZESTRIL) 5 MG tablet TAKE ONE TABLET BY MOUTH ONCE DAILY Qty: 30 tablet, Refills: 11    metoprolol (LOPRESSOR) 100 MG tablet Take 1 tablet (100 mg total) by mouth 2 (two) times daily. Qty: 180 tablet, Refills: 3    traMADol (ULTRAM) 50 MG tablet Take 50 mg by mouth every 6 (six) hours as needed for moderate  pain.    warfarin (COUMADIN) 5 MG tablet Take 5-7.5 mg by mouth daily. Take 5mg  everyday except Friday take 7.5mg       STOP taking these medications     clopidogrel (PLAVIX) 75 MG tablet           Outstanding Labs/Studies   BMET, 2D ECHO  Duration of Discharge Encounter   Greater than 30 minutes including physician time.  SignedAngelena Form R PA-C 11/19/2015, 10:48 AM   Personally seen and examined. Agree with above. OK for DC CTAB meds as above.   Candee Furbish, MD

## 2015-11-19 NOTE — Progress Notes (Signed)
ANTICOAGULATION CONSULT NOTE - Follow Up Consult  Pharmacy Consult for warfarin Indication: atrial fibrillation  No Known Allergies  Patient Measurements: Height: 5\' 9"  (175.3 cm) Weight: 182 lb 9.6 oz (82.827 kg) IBW/kg (Calculated) : 70.7  Vital Signs: Temp: 98.6 F (37 C) (03/04 0619) Temp Source: Oral (03/04 0619) BP: 150/91 mmHg (03/04 0619) Pulse Rate: 108 (03/04 0619)  Labs:  Recent Labs  11/17/15 0555 11/17/15 1105 11/18/15 0721 11/19/15 0604  HGB 13.5  --   --  14.4  HCT 39.9  --   --  44.0  PLT 216  --   --  231  LABPROT  --  26.3* 24.3* 26.3*  INR  --  2.45* 2.21* 2.46*  CREATININE 1.14  --  1.17 1.13    Estimated Creatinine Clearance: 48.7 mL/min (by C-G formula based on Cr of 1.13).   Medications:  Prescriptions prior to admission  Medication Sig Dispense Refill Last Dose  . acetaminophen (TYLENOL) 500 MG tablet Take 500 mg by mouth every 4 (four) hours as needed for fever (pain).   Past Month at Unknown time  . Ascorbic Acid (VITAMIN C) 1000 MG tablet Take 1,000 mg by mouth daily.   11/16/2015 at Unknown time  . atorvastatin (LIPITOR) 40 MG tablet Take 40 mg by mouth daily at 6 PM.   11/16/2015 at Unknown time  . clopidogrel (PLAVIX) 75 MG tablet TAKE ONE TABLET BY MOUTH ONCE DAILY WITH BREAKFAST 30 tablet 3 11/16/2015 at Unknown time  . lisinopril (PRINIVIL,ZESTRIL) 5 MG tablet TAKE ONE TABLET BY MOUTH ONCE DAILY 30 tablet 11 11/16/2015 at Unknown time  . metoprolol (LOPRESSOR) 100 MG tablet Take 1 tablet (100 mg total) by mouth 2 (two) times daily. 180 tablet 3 11/16/2015 at 1830  . traMADol (ULTRAM) 50 MG tablet Take 50 mg by mouth every 6 (six) hours as needed for moderate pain.   11/16/2015 at Unknown time  . warfarin (COUMADIN) 5 MG tablet Take 5-7.5 mg by mouth daily. Take 5mg  everyday except Friday take 7.5mg    11/16/2015 at Unknown time    Assessment: 82 yo M admitted 11/17/2015 with dyspnea on warfarin prior to admission for Afib.   INR 2.46, H/H stable,  Plt wnl. No s/sx of bleeding noted.  PTA dose = 7.5mg  on Friday and 5mg  daily all other days  Goal of Therapy:  INR 2-3 Monitor platelets by anticoagulation protocol: Yes   Plan:  - Coumadin 5mg  x1 tonight  - Monitor daily INR/CBC - Monitor for s/sx of bleeding   Dimitri Ped, PharmD. PGY-1 Pharmacy Resident Pager: [phone] 11/19/2015,9:03 AM

## 2015-11-22 ENCOUNTER — Telehealth: Payer: Self-pay | Admitting: Cardiovascular Disease

## 2015-11-22 NOTE — Telephone Encounter (Signed)
Patient contacted regarding discharge from  [facility] on 11/19/15.  Patient understands to follow up with provider Richardson Dopp PA on 11/29/15 at 1:30 at [facility]. Patient understands discharge instructions? yes Patient understands medications and regiment? yes Patient understands to bring all medications to this visit? Yes  Patient wants to see if his appointment with Coumadin clinic can be changed to day of his office visit.

## 2015-11-22 NOTE — Telephone Encounter (Signed)
TOC per Kathlene November-  3/14 @ 130pm w'/ Nicki Reaper

## 2015-11-22 NOTE — Telephone Encounter (Signed)
Appt Cx and rescheduled to coincided with Hosp follow on 11/29/15. LMOM at home and Mobile.Marland KitchenMarland Kitchen

## 2015-11-24 ENCOUNTER — Other Ambulatory Visit: Payer: Self-pay | Admitting: Physician Assistant

## 2015-11-25 ENCOUNTER — Ambulatory Visit (INDEPENDENT_AMBULATORY_CARE_PROVIDER_SITE_OTHER): Payer: Medicare Other | Admitting: Family Medicine

## 2015-11-25 ENCOUNTER — Encounter: Payer: Self-pay | Admitting: Family Medicine

## 2015-11-25 ENCOUNTER — Other Ambulatory Visit: Payer: Self-pay | Admitting: Internal Medicine

## 2015-11-25 VITALS — BP 110/82 | HR 84 | Temp 98.2°F | Ht 69.0 in | Wt 186.0 lb

## 2015-11-25 DIAGNOSIS — B0229 Other postherpetic nervous system involvement: Secondary | ICD-10-CM

## 2015-11-25 NOTE — Progress Notes (Signed)
Pre visit review using our clinic review tool, if applicable. No additional management support is needed unless otherwise documented below in the visit note. 

## 2015-11-25 NOTE — Progress Notes (Signed)
   Subjective:    Patient ID: Ricardo Valencia, male    DOB: [birth year], 82 y.o.   MRN: [id number]  HPI  Patient seen for follow-up regarding shingles involving right side of face and neck. Refer to last note. He had still 8 out of 10 pain and we added hydrocodone and advised that he stop tramadol.  unfortunately, his wife was confused and continued the tramadol and stopped the gabapentin. Current pain level is 6-7 out of 10 in pain is somewhat intermittent. Still has difficulty sleeping at times secondary to pain.   He's been applying hydrogen peroxide, topical alcohol, and lidocaine spray to right neck skin lesions and these have been very slow to heal.  Past Medical History  Diagnosis Date  . Allergy     hay fever, allergies  . Hyperlipidemia   . Hypertension   . CAD (coronary artery disease)     DES to mid LAD 11/10/2014  . Persistent atrial fibrillation ([place])   . Chronic systolic heart failure [facility])    Past Surgical History  Procedure Laterality Date  . Appendectomy  1947  . Left heart catheterization with coronary angiogram N/A 11/10/2014    Procedure: LEFT HEART CATHETERIZATION WITH CORONARY ANGIOGRAM;  Surgeon: Wellington Hampshire, MD;  Location: [place] CATH LAB;  Service: Cardiovascular;  Laterality: N/A;  . Percutaneous coronary stent intervention (pci-s)  11/10/2014    Procedure: PERCUTANEOUS CORONARY STENT INTERVENTION (PCI-S);  Surgeon: Wellington Hampshire, MD;  Location: [facility] CATH LAB;  Service: Cardiovascular;;  . Cardioversion N/A 12/30/2014    Procedure: CARDIOVERSION;  Surgeon: Jerline Pain, MD;  Location: [facility] ENDOSCOPY;  Service: Cardiovascular;  Laterality: N/A;    reports that he has never smoked. He has never used smokeless tobacco. He reports that he does not drink alcohol or use illicit drugs. family history includes Cancer in his father and mother. No Known Allergies    Review of Systems  Constitutional: Negative for fever and chills.       Objective:   Physical  Exam  Constitutional: He appears well-developed and well-nourished.  Cardiovascular: Normal rate.   Skin:  Patient has very dry areas of wound right neck from recent shingles with fairly large area of superficial eschar. No signs of secondary infection.          Assessment & Plan:   Recent shingles right face and neck with postherpetic neuralgia. Stop tramadol. Start back gabapentin 300 mg daily at bedtime and after 2-3 night increased to 1 twice a day-if needed. May supplement with regular Tylenol but not to exceed every 6 hours. Discontinue application of topical hydrogen peroxide, alcohol, and lidocaine spray which can all dry excessively. Key clean with soap and water. Topical antibiotic once daily. Reassess 3-4 weeks.

## 2015-11-25 NOTE — Patient Instructions (Signed)
STOP Tramadol Start Gabapentin 300 mg once at night and may increase to one twice a day after 3 days May supplement with Tylenol as needed Keep skin clean with soap and water and then apply topical antibiotic AVOID hydrogen peroxide, alcohol, or lidocaine spray- all these can dry excessively.

## 2015-11-28 NOTE — Progress Notes (Signed)
Cardiology Office Note:    Date:  11/29/2015   ID:  Ricardo Valencia, DOB 01/19/1929, MRN [id number]  PCP:  Eulas Post, MD  Cardiologist:  Dr. Lauree Chandler   Electrophysiologist:  n/a  Chief Complaint  Patient presents with  . Hospitalization Follow-up    ECG - admx with CHF in setting of AF with RVR    History of Present Illness:     Ricardo Valencia is a 82 y.o. male with a hx of mild dementia, HTN, HL, persistent AFib, CAD s/p DES to LAD in 2016, Ischemic CM, systolic HF.  EF was 25-30% at the time of his PCI and improved to 45-50% by echo in 4/16.  Admitted in 1/17 for AF with RVR in the setting of Herpes Zoster outbreak.  He had some adjustments made in his medications post DC when his BP remained elevated.    He was readmitted [id number] with a/c systolic CHF in the setting of AF with RVR.  DC weight was 182 lbs.  He he was kept on Diltiazem for rate control despite his DCM.    CHADS2-VASc=5.    Returns for FU.  Here with his wife. He is doing well.  His shortness of breath is improved. He denies chest pain, syncope, orthopnea, PND, edema.  Weights have been stable since DC.     Past Medical History  Diagnosis Date  . Allergy     hay fever, allergies  . Hyperlipidemia   . Hypertension   . CAD (coronary artery disease)     DES to mid LAD 11/10/2014  . Persistent atrial fibrillation ([place])   . Chronic systolic heart failure [facility])     Past Surgical History  Procedure Laterality Date  . Appendectomy  1947  . Left heart catheterization with coronary angiogram N/A 11/10/2014    Procedure: LEFT HEART CATHETERIZATION WITH CORONARY ANGIOGRAM;  Surgeon: Wellington Hampshire, MD;  Location: [place] CATH LAB;  Service: Cardiovascular;  Laterality: N/A;  . Percutaneous coronary stent intervention (pci-s)  11/10/2014    Procedure: PERCUTANEOUS CORONARY STENT INTERVENTION (PCI-S);  Surgeon: Wellington Hampshire, MD;  Location: [facility] CATH LAB;  Service: Cardiovascular;;  .  Cardioversion N/A 12/30/2014    Procedure: CARDIOVERSION;  Surgeon: Jerline Pain, MD;  Location: [facility] ENDOSCOPY;  Service: Cardiovascular;  Laterality: N/A;    Current Medications: Outpatient Prescriptions Prior to Visit  Medication Sig Dispense Refill  . acetaminophen (TYLENOL) 500 MG tablet Take 500 mg by mouth every 4 (four) hours as needed for fever (pain).    . Ascorbic Acid (VITAMIN C) 1000 MG tablet Take 1,000 mg by mouth daily.    Marland Kitchen atorvastatin (LIPITOR) 40 MG tablet Take 40 mg by mouth daily at 6 PM.    . diltiazem (CARDIZEM CD) 120 MG 24 hr capsule Take 1 capsule (120 mg total) by mouth daily. 30 capsule 6  . furosemide (LASIX) 40 MG tablet Take 1 tablet (40 mg total) by mouth daily. 30 tablet 6  . gabapentin (NEURONTIN) 300 MG capsule TAKE ONE CAPSULE BY MOUTH UP TO THREE TIMES DAILY AS NEEDED FOR PAIN 180 capsule 1  . lisinopril (PRINIVIL,ZESTRIL) 5 MG tablet TAKE ONE TABLET BY MOUTH ONCE DAILY 30 tablet 11  . metoprolol (LOPRESSOR) 100 MG tablet Take 1 tablet (100 mg total) by mouth 2 (two) times daily. 180 tablet 3  . potassium chloride (K-DUR) 10 MEQ tablet Take 1 tablet (10 mEq total) by mouth daily. 30 tablet 6  . warfarin (COUMADIN) 5  MG tablet Take 5-7.5 mg by mouth daily. Take 5mg  everyday except Friday take 7.5mg      No facility-administered medications prior to visit.     Allergies:   Review of patient's allergies indicates no known allergies.   Social History   Social History  . Marital Status: Married    Spouse Name: N/A  . Number of Children: N/A  . Years of Education: N/A   Social History Main Topics  . Smoking status: Never Smoker   . Smokeless tobacco: Never Used  . Alcohol Use: No  . Drug Use: No  . Sexual Activity: Not Asked   Other Topics Concern  . None   Social History Narrative     Family History:  The patient's family history includes Cancer in his father and mother.   ROS:   Please see the history of present illness.    Review of  Systems  Constitution: Positive for weight loss.  Cardiovascular: Positive for dyspnea on exertion and irregular heartbeat.  Respiratory: Positive for snoring.   Gastrointestinal: Positive for diarrhea.  Neurological: Positive for dizziness.  All other systems reviewed and are negative.   Physical Exam:    VS:  BP 108/60 mmHg  Pulse 80  Ht 5\' 9"  (1.753 m)  Wt 187 lb 11.2 oz (85.14 kg)  BMI 27.71 kg/m2   GEN: Well nourished, well developed, in no acute distress HEENT: normal Neck: no JVD, no masses Cardiac: Normal S1/S2, irreg irreg rhythm; no murmurs, rubs, or gallops, no edema  Respiratory:  clear to auscultation bilaterally; no wheezing, rhonchi or rales GI: soft, nontender, nondistended MS: no deformity or atrophy Skin: warm and dry Neuro: No focal deficits  Psych: Alert and oriented x 3, normal affect  Wt Readings from Last 3 Encounters:  11/29/15 187 lb 11.2 oz (85.14 kg)  11/25/15 186 lb (84.369 kg)  11/19/15 182 lb 9.6 oz (82.827 kg)      Studies/Labs Reviewed:     EKG:  EKG is  ordered today.  The ekg ordered today demonstrates AFib, HR 80, low voltage, no significant change since prior tracing.  Recent Labs: 11/17/2015: ALT 43; B Natriuretic Peptide 629.5* 11/19/2015: BUN 12; Creatinine, Ser 1.13; Hemoglobin 14.4; Platelets 231; Potassium 3.8; Sodium 139   Recent Lipid Panel    Component Value Date/Time   CHOL 124 12/10/2014 1043   TRIG 74 12/10/2014 1043   HDL 53 12/10/2014 1043   CHOLHDL 2.3 12/10/2014 1043   VLDL 15 12/10/2014 1043   LDLCALC 56 12/10/2014 1043   LDLDIRECT 191.1 01/04/2011 1218    Additional studies/ records that were reviewed today include:   Echo 4/16 Mild LVH, EF 45-50%, mod LAE, severe RAE  LHC 2/16 LM normal LAD mid 90%, D1 prox 40%, D2 ostial 20%, D2 irregs LCx irregs  RCA dist 50% EF 30-35% PCI: 2.75 x 18 mm XienceDES to mid LAD  Echo 2/16 EF 25-30%   ASSESSMENT:     1. Persistent atrial fibrillation ([place])   2.  Chronic systolic heart failure ([place])   3. Ischemic cardiomyopathy   4. Coronary artery disease involving native coronary artery of native heart without angina pectoris   5. Essential hypertension   6. Hyperlipidemia     PLAN:     In order of problems listed above:  1. Persistent AFib - Rate is controlled. He is fairly asymptomatic. A strategy of rate control seems to be appropriate for him. Continue Coumadin, current dose of Dilt and Metoprolol.  2. Chronic  Systolic HF - Volume stable.  He is NYHA 2-2b.  We discussed the importance of daily weights.    3. Ischemic CM - Arrange FU echo as recommended in the hospital.  Continue beta blocker, ACE inhibitor.    4. CAD - No angina.  Continue beta blocker, statin.  No ASA as he is on Coumadin.   5. HTN - Controlled.   6. HL - Continue statin.    Medication Adjustments/Labs and Tests Ordered: Current medicines are reviewed at length with the patient today.  Concerns regarding medicines are outlined above.  Medication changes, Labs and Tests ordered today are outlined in the Patient Instructions noted below. Patient Instructions  Medication Instructions:  No changes. See your medication list.  Labwork: Today - BMET  Testing/Procedures: Schedule an Echocardiogram   Follow-Up: Dr. Lauree Chandler in May 2017 as scheduled.  Any Other Special Instructions Will Be Listed Below (If Applicable). 1. Weigh daily. If your weight goes up 3 lbs in 1 day or 5 lbs in 1 week, call our office. 2. If your blood pressure (top number) is consistently greater than 150, call our office. 3. If your heart rate (pulse) is consistently above 110, call our office.   If you need a refill on your cardiac medications before your next appointment, please call your pharmacy.    Signed, Richardson Dopp, PA-C  11/29/2015 1:52 PM    [facility] [address], [place], [place]  [address] Phone: [phone]; Fax: [phone]

## 2015-11-29 ENCOUNTER — Encounter: Payer: Self-pay | Admitting: Physician Assistant

## 2015-11-29 ENCOUNTER — Ambulatory Visit (INDEPENDENT_AMBULATORY_CARE_PROVIDER_SITE_OTHER): Payer: Medicare Other

## 2015-11-29 ENCOUNTER — Ambulatory Visit (INDEPENDENT_AMBULATORY_CARE_PROVIDER_SITE_OTHER): Payer: Medicare Other | Admitting: Physician Assistant

## 2015-11-29 VITALS — BP 108/60 | HR 80 | Ht 69.0 in | Wt 187.7 lb

## 2015-11-29 DIAGNOSIS — I5022 Chronic systolic (congestive) heart failure: Secondary | ICD-10-CM | POA: Diagnosis not present

## 2015-11-29 DIAGNOSIS — I10 Essential (primary) hypertension: Secondary | ICD-10-CM

## 2015-11-29 DIAGNOSIS — I481 Persistent atrial fibrillation: Secondary | ICD-10-CM

## 2015-11-29 DIAGNOSIS — I4819 Other persistent atrial fibrillation: Secondary | ICD-10-CM

## 2015-11-29 DIAGNOSIS — Z5181 Encounter for therapeutic drug level monitoring: Secondary | ICD-10-CM

## 2015-11-29 DIAGNOSIS — I4891 Unspecified atrial fibrillation: Secondary | ICD-10-CM

## 2015-11-29 DIAGNOSIS — I2589 Other forms of chronic ischemic heart disease: Secondary | ICD-10-CM | POA: Diagnosis not present

## 2015-11-29 DIAGNOSIS — I2510 Atherosclerotic heart disease of native coronary artery without angina pectoris: Secondary | ICD-10-CM

## 2015-11-29 DIAGNOSIS — E785 Hyperlipidemia, unspecified: Secondary | ICD-10-CM

## 2015-11-29 DIAGNOSIS — I255 Ischemic cardiomyopathy: Secondary | ICD-10-CM

## 2015-11-29 DIAGNOSIS — Z7901 Long term (current) use of anticoagulants: Secondary | ICD-10-CM

## 2015-11-29 LAB — POCT INR: INR: 2.5

## 2015-11-29 LAB — BASIC METABOLIC PANEL
BUN: 17 mg/dL (ref 7–25)
CALCIUM: 8.9 mg/dL (ref 8.6–10.3)
CO2: 26 mmol/L (ref 20–31)
Chloride: 99 mmol/L (ref 98–110)
Creat: 1.33 mg/dL — ABNORMAL HIGH (ref 0.70–1.11)
Glucose, Bld: 92 mg/dL (ref 65–99)
POTASSIUM: 4 mmol/L (ref 3.5–5.3)
SODIUM: 138 mmol/L (ref 135–146)

## 2015-11-29 NOTE — Patient Instructions (Addendum)
Medication Instructions:  No changes. See your medication list.  Labwork: Today - BMET  Testing/Procedures: Schedule an Echocardiogram   Follow-Up: Dr. Lauree Chandler in May 2017 as scheduled.  Any Other Special Instructions Will Be Listed Below (If Applicable). 1. Weigh daily. If your weight goes up 3 lbs in 1 day or 5 lbs in 1 week, call our office. 2. If your blood pressure (top number) is consistently greater than 150, call our office. 3. If your heart rate (pulse) is consistently above 110, call our office.   Echocardiogram An echocardiogram, or echocardiography, uses sound waves (ultrasound) to produce an image of your heart. The echocardiogram is simple, painless, obtained within a short period of time, and offers valuable information to your health care provider. The images from an echocardiogram can provide information such as:  Evidence of coronary artery disease (CAD).  Heart size.  Heart muscle function.  Heart valve function.  Aneurysm detection.  Evidence of a past heart attack.  Fluid buildup around the heart.  Heart muscle thickening.  Assess heart valve function. LET [facility] CARE PROVIDER KNOW ABOUT:  Any allergies you have.  All medicines you are taking, including vitamins, herbs, eye drops, creams, and over-the-counter medicines.  Previous problems you or members of your family have had with the use of anesthetics.  Any blood disorders you have.  Previous surgeries you have had.  Medical conditions you have.  Possibility of pregnancy, if this applies. BEFORE THE PROCEDURE  No special preparation is needed. Eat and drink normally.  PROCEDURE   In order to produce an image of your heart, gel will be applied to your chest and a wand-like tool (transducer) will be moved over your chest. The gel will help transmit the sound waves from the transducer. The sound waves will harmlessly bounce off your heart to allow the heart images to be  captured in real-time motion. These images will then be recorded.  You may need an IV to receive a medicine that improves the quality of the pictures. AFTER THE PROCEDURE You may return to your normal schedule including diet, activities, and medicines, unless your health care provider tells you otherwise.   This information is not intended to replace advice given to you by your health care provider. Make sure you discuss any questions you have with your health care provider.   Document Released: 08/31/2000 Document Revised: 09/24/2014 Document Reviewed: 05/11/2013 Elsevier Interactive Patient Education [employer].   If you need a refill on your cardiac medications before your next appointment, please call your pharmacy.

## 2015-12-01 ENCOUNTER — Telehealth: Payer: Self-pay | Admitting: *Deleted

## 2015-12-01 DIAGNOSIS — I10 Essential (primary) hypertension: Secondary | ICD-10-CM

## 2015-12-01 DIAGNOSIS — I255 Ischemic cardiomyopathy: Secondary | ICD-10-CM

## 2015-12-01 DIAGNOSIS — I5022 Chronic systolic (congestive) heart failure: Secondary | ICD-10-CM

## 2015-12-01 MED ORDER — FUROSEMIDE 40 MG PO TABS: 40.0000 mg | ORAL_TABLET | ORAL | Status: DC

## 2015-12-01 NOTE — Telephone Encounter (Signed)
DPR for wife who was notified of lab results and med changes for pt, BMET 3/28 when pt comes in for echo. Wife verbalized instructions to me x 2 with understanding.

## 2015-12-13 ENCOUNTER — Ambulatory Visit (HOSPITAL_COMMUNITY): Payer: Medicare Other | Attending: Cardiology

## 2015-12-13 ENCOUNTER — Other Ambulatory Visit: Payer: Self-pay

## 2015-12-13 ENCOUNTER — Other Ambulatory Visit (INDEPENDENT_AMBULATORY_CARE_PROVIDER_SITE_OTHER): Payer: Medicare Other | Admitting: *Deleted

## 2015-12-13 DIAGNOSIS — I110 Hypertensive heart disease with heart failure: Secondary | ICD-10-CM | POA: Diagnosis not present

## 2015-12-13 DIAGNOSIS — I5022 Chronic systolic (congestive) heart failure: Secondary | ICD-10-CM

## 2015-12-13 DIAGNOSIS — I4891 Unspecified atrial fibrillation: Secondary | ICD-10-CM | POA: Insufficient documentation

## 2015-12-13 DIAGNOSIS — I2510 Atherosclerotic heart disease of native coronary artery without angina pectoris: Secondary | ICD-10-CM | POA: Diagnosis not present

## 2015-12-13 DIAGNOSIS — E785 Hyperlipidemia, unspecified: Secondary | ICD-10-CM | POA: Insufficient documentation

## 2015-12-13 DIAGNOSIS — I255 Ischemic cardiomyopathy: Secondary | ICD-10-CM

## 2015-12-13 DIAGNOSIS — I340 Nonrheumatic mitral (valve) insufficiency: Secondary | ICD-10-CM | POA: Diagnosis not present

## 2015-12-13 DIAGNOSIS — I2589 Other forms of chronic ischemic heart disease: Secondary | ICD-10-CM | POA: Diagnosis not present

## 2015-12-13 DIAGNOSIS — I5021 Acute systolic (congestive) heart failure: Secondary | ICD-10-CM | POA: Diagnosis present

## 2015-12-13 DIAGNOSIS — I10 Essential (primary) hypertension: Secondary | ICD-10-CM | POA: Diagnosis not present

## 2015-12-13 DIAGNOSIS — I351 Nonrheumatic aortic (valve) insufficiency: Secondary | ICD-10-CM | POA: Insufficient documentation

## 2015-12-13 LAB — BASIC METABOLIC PANEL
BUN: 13 mg/dL (ref 7–25)
CALCIUM: 8.3 mg/dL — AB (ref 8.6–10.3)
CHLORIDE: 105 mmol/L (ref 98–110)
CO2: 26 mmol/L (ref 20–31)
CREATININE: 1.17 mg/dL — AB (ref 0.70–1.11)
Glucose, Bld: 95 mg/dL (ref 65–99)
Potassium: 3.8 mmol/L (ref 3.5–5.3)
SODIUM: 141 mmol/L (ref 135–146)

## 2015-12-14 ENCOUNTER — Encounter: Payer: Self-pay | Admitting: Physician Assistant

## 2015-12-14 ENCOUNTER — Telehealth: Payer: Self-pay | Admitting: *Deleted

## 2015-12-14 NOTE — Telephone Encounter (Signed)
Pt notified of lab and echo results and findings by phone with verbal understanding to all results given today

## 2015-12-14 NOTE — Telephone Encounter (Signed)
Lmtcb for lab and echo results

## 2015-12-16 ENCOUNTER — Telehealth: Payer: Self-pay | Admitting: Cardiovascular Disease

## 2015-12-16 NOTE — Telephone Encounter (Signed)
Phone call transferred directly from refill room to triage. The patient's wife was calling to discuss the patient's medications and confirm he was taking the right medications.  Medication list confirmed with the patient's wife. She was still giving him plavix 75 mg daily. I advised this was stopped at discharge on 11/19/15. All questions answered. The patient's wife verbalized understanding of the patient's medication list.

## 2015-12-19 ENCOUNTER — Telehealth: Payer: Self-pay | Admitting: Cardiovascular Disease

## 2015-12-19 NOTE — Telephone Encounter (Signed)
Went over the pts med list with the wife thoroughly, with verbal read back.  Informed the pts wife that the pt is NOT to be taking Plavix, for this was d/c'ed from his med list.  Pts wife spoke with one of our triage RN last Friday, in regards to the pts current med list.  Informed the pts wife that I will mail her 2 current copies of the pts current med list, too their confirmed mailing address.  Wife verbalized understanding, agrees with this plan, and very gracious for all the assistance provided.

## 2015-12-19 NOTE — Telephone Encounter (Signed)
New message      Talk to the nurse to go over medications.

## 2015-12-20 ENCOUNTER — Other Ambulatory Visit: Payer: Self-pay | Admitting: Physician Assistant

## 2015-12-20 NOTE — Telephone Encounter (Signed)
Rx refill sent to pharmacy. 

## 2015-12-23 ENCOUNTER — Ambulatory Visit (INDEPENDENT_AMBULATORY_CARE_PROVIDER_SITE_OTHER): Payer: Medicare Other | Admitting: Pharmacist

## 2015-12-23 DIAGNOSIS — I4891 Unspecified atrial fibrillation: Secondary | ICD-10-CM | POA: Diagnosis not present

## 2015-12-23 DIAGNOSIS — Z7901 Long term (current) use of anticoagulants: Secondary | ICD-10-CM | POA: Diagnosis not present

## 2015-12-23 DIAGNOSIS — Z5181 Encounter for therapeutic drug level monitoring: Secondary | ICD-10-CM | POA: Diagnosis not present

## 2015-12-23 LAB — POCT INR: INR: 2.5

## 2015-12-26 ENCOUNTER — Encounter: Payer: Self-pay | Admitting: Family Medicine

## 2015-12-26 ENCOUNTER — Ambulatory Visit (INDEPENDENT_AMBULATORY_CARE_PROVIDER_SITE_OTHER): Payer: Medicare Other | Admitting: Family Medicine

## 2015-12-26 VITALS — BP 120/72 | HR 69 | Temp 98.0°F | Ht 69.0 in | Wt 190.7 lb

## 2015-12-26 DIAGNOSIS — B0229 Other postherpetic nervous system involvement: Secondary | ICD-10-CM

## 2015-12-26 DIAGNOSIS — I255 Ischemic cardiomyopathy: Secondary | ICD-10-CM

## 2015-12-26 NOTE — Progress Notes (Signed)
Pre visit review using our clinic review tool, if applicable. No additional management support is needed unless otherwise documented below in the visit note. 

## 2015-12-26 NOTE — Patient Instructions (Signed)
Postherpetic Neuralgia   Postherpetic neuralgia (PHN) is nerve pain that occurs after a shingles infection. Shingles is a painful rash that appears on one side of the body, usually on your trunk or face. Shingles is caused by the varicella-zoster virus. This is the same virus that causes chickenpox. In people who have had chickenpox, the virus can resurface years later and cause shingles.   You may have PHN if you continue to have pain for 3 months after your shingles rash has gone away. PHN appears in the same area where you had the shingles rash. For most people, PHN goes away within 1 year.   Getting a vaccination for shingles can prevent PHN. This vaccine is recommended for people older than 50. It may prevent shingles and may also lower your risk of PHN if you do get shingles.   CAUSES   PHN is caused by damage to your nerves from the varicella-zoster virus. This damage makes your nerves overly sensitive.   RISK FACTORS   Aging is the biggest risk factor for developing PHN. Most people who get PHN are older than 60. Other risk factors include:   Having very bad pain before your shingles rash starts.   Having a very bad rash.   Having shingles in the nerve that supplies your face and eye (trigeminal nerve).  SIGNS AND SYMPTOMS   Pain is the main symptom of PHN. The pain is often very bad and may be described as stabbing, burning, or feeling like an electric shock. The pain may come and go or may be there all the time. Pain may be triggered by light touches on the skin or changes in temperature. You may have itching along with the pain.   DIAGNOSIS   Your health care provider may diagnose PHN based on your symptoms and your history of shingles. Lab studies and other diagnostic tests are usually not needed.   TREATMENT   There is no cure for PHN. Treatment for PHN will focus on pain relief. Over-the-counter pain relievers do not usually relieve PHN pain. You may need to work with a pain specialist. Treatment may  include:   Antidepressant medicines to help with pain and improve sleep.   Antiseizure medicines to relieve nerve pain.   Strong pain relievers (opioids).   A numbing patch worn on the skin (lidocaine patch).  HOME CARE INSTRUCTIONS   It may take a long time to recover from PHN. Work closely with your health care provider, and have a good support system at home.   Take all medicines as directed by your health care provider.   Wear loose, comfortable clothing.   Cover sensitive areas with a dressing to reduce friction from clothing rubbing on the area.   If cold does not make your pain worse, try applying a cool compress or cooling gel pack to the area.   Talk to your health care provider if you feel depressed or desperate. Living with long-term pain can be depressing.  SEEK MEDICAL CARE IF:   Your medicine is not helping.   You are struggling to manage your pain at home.  This information is not intended to replace advice given to you by your health care provider. Make sure you discuss any questions you have with your health care provider.   Document Released: 11/24/2002 Document Revised: 09/24/2014 Document Reviewed: 08/25/2013   Elsevier Interactive Patient Education 2016 Elsevier Inc.

## 2015-12-26 NOTE — Progress Notes (Signed)
   Subjective:    Patient ID: Ricardo Valencia, male    DOB: 06/07/1929, 82 y.o.   MRN: [id number]  HPI  Patient here for follow-up regarding right face and neck shingles with postherpetic neuralgia.  Currently treated with gabapentin. He's not had any falls or extreme dizziness.  Pain is slowly improving. Much smaller area of involvement. Still has occasional pain level at 8 but most of time pain level around 3. Supplements with Tylenol. We took off tramadol.   Last visit he was applying multiple topicals that were excessively drying skin.  He has history of multiple skin cancers and has had biopsies per dermatology but needs follow-up for excision  Past Medical History  Diagnosis Date  . Allergy     hay fever, allergies  . Hyperlipidemia   . Hypertension   . CAD (coronary artery disease)     DES to mid LAD 11/10/2014  . Persistent atrial fibrillation ([place])   . Chronic systolic heart failure ([place])   . History of echocardiogram     Echo 3/17: EF 40%, diff HK, trivial AI, trivial MR, mod LAE, mild reduced RVSF, mod RAE   Past Surgical History  Procedure Laterality Date  . Appendectomy  1947  . Left heart catheterization with coronary angiogram N/A 11/10/2014    Procedure: LEFT HEART CATHETERIZATION WITH CORONARY ANGIOGRAM;  Surgeon: Wellington Hampshire, MD;  Location: [place] CATH LAB;  Service: Cardiovascular;  Laterality: N/A;  . Percutaneous coronary stent intervention (pci-s)  11/10/2014    Procedure: PERCUTANEOUS CORONARY STENT INTERVENTION (PCI-S);  Surgeon: Wellington Hampshire, MD;  Location: [facility] CATH LAB;  Service: Cardiovascular;;  . Cardioversion N/A 12/30/2014    Procedure: CARDIOVERSION;  Surgeon: Jerline Pain, MD;  Location: [facility] ENDOSCOPY;  Service: Cardiovascular;  Laterality: N/A;    reports that he has never smoked. He has never used smokeless tobacco. He reports that he does not drink alcohol or use illicit drugs. family history includes Cancer in his father and mother. No Known  Allergies    Review of Systems  Constitutional: Negative for fever and chills.       Objective:   Physical Exam  Constitutional: He appears well-developed and well-nourished.  Cardiovascular: Normal rate and regular rhythm.   Skin:  Areas of hypopigmentation from recent shingles right neck and right face. He has a couple areas of dryness but overall lesions have healed very well.  Patient has nodule left forehead as well as left side of neck which has been previously biopsied by dermatology. He needs to get follow-up with them          Assessment & Plan:    Postherpetic neuralgia following severe case of shingles right face and right neck. Overall improving. Continue gabapentin. Hopefully, can start tapering back is gabapentin soon. He has scheduled office follow-up in one month.   History of multiple skin cancers including squamous cell carcinoma and basal cell carcinoma. He is strongly encouraged to follow-up with skin surgery Center for further evaluation. He has already had biopsies and needs excisional treatment

## 2016-01-09 ENCOUNTER — Other Ambulatory Visit: Payer: Self-pay | Admitting: Physician Assistant

## 2016-01-10 NOTE — Telephone Encounter (Signed)
Should this still be 40mg  alternating qod with 20mg ? Please advise. Thanks, MI

## 2016-01-10 NOTE — Telephone Encounter (Signed)
Per lab results from 11/29/15--- Change Lasix to 20 mg QD x 2 days >> Then change Lasix to 40 mg every other day alternated with 20 mg every other day Pt should continue this dosing

## 2016-01-18 ENCOUNTER — Ambulatory Visit: Payer: Medicare Other | Admitting: Family Medicine

## 2016-01-23 ENCOUNTER — Other Ambulatory Visit: Payer: Self-pay | Admitting: Cardiovascular Disease

## 2016-02-03 DIAGNOSIS — D485 Neoplasm of uncertain behavior of skin: Secondary | ICD-10-CM | POA: Diagnosis not present

## 2016-02-03 DIAGNOSIS — C44119 Basal cell carcinoma of skin of left eyelid, including canthus: Secondary | ICD-10-CM | POA: Diagnosis not present

## 2016-02-03 DIAGNOSIS — C44319 Basal cell carcinoma of skin of other parts of face: Secondary | ICD-10-CM | POA: Diagnosis not present

## 2016-02-06 ENCOUNTER — Ambulatory Visit (INDEPENDENT_AMBULATORY_CARE_PROVIDER_SITE_OTHER): Payer: Medicare Other | Admitting: Cardiovascular Disease

## 2016-02-06 ENCOUNTER — Ambulatory Visit (INDEPENDENT_AMBULATORY_CARE_PROVIDER_SITE_OTHER): Payer: Medicare Other | Admitting: *Deleted

## 2016-02-06 ENCOUNTER — Encounter: Payer: Self-pay | Admitting: Cardiovascular Disease

## 2016-02-06 VITALS — BP 142/64 | HR 56 | Ht 69.0 in | Wt 187.8 lb

## 2016-02-06 DIAGNOSIS — Z5181 Encounter for therapeutic drug level monitoring: Secondary | ICD-10-CM | POA: Diagnosis not present

## 2016-02-06 DIAGNOSIS — I10 Essential (primary) hypertension: Secondary | ICD-10-CM

## 2016-02-06 DIAGNOSIS — Z7901 Long term (current) use of anticoagulants: Secondary | ICD-10-CM

## 2016-02-06 DIAGNOSIS — I4891 Unspecified atrial fibrillation: Secondary | ICD-10-CM | POA: Diagnosis not present

## 2016-02-06 DIAGNOSIS — I255 Ischemic cardiomyopathy: Secondary | ICD-10-CM

## 2016-02-06 DIAGNOSIS — I2510 Atherosclerotic heart disease of native coronary artery without angina pectoris: Secondary | ICD-10-CM

## 2016-02-06 DIAGNOSIS — I481 Persistent atrial fibrillation: Secondary | ICD-10-CM | POA: Diagnosis not present

## 2016-02-06 DIAGNOSIS — E785 Hyperlipidemia, unspecified: Secondary | ICD-10-CM

## 2016-02-06 DIAGNOSIS — I4819 Other persistent atrial fibrillation: Secondary | ICD-10-CM

## 2016-02-06 DIAGNOSIS — I5022 Chronic systolic (congestive) heart failure: Secondary | ICD-10-CM

## 2016-02-06 LAB — POCT INR: INR: 2.7

## 2016-02-06 NOTE — Patient Instructions (Signed)
Medication Instructions:  Your physician recommends that you continue on your current medications as directed. Please refer to the Current Medication list given to you today.   Labwork: Your physician recommends that you return for lab work on May 31,2017.  This is fasting lab work.  The lab opens at 7:30 AM   Testing/Procedures: none  Follow-Up: Your physician wants you to follow-up in: 12 months.  You will receive a reminder letter in the mail two months in advance. If you don't receive a letter, please call our office to schedule the follow-up appointment.   Any Other Special Instructions Will Be Listed Below (If Applicable).     If you need a refill on your cardiac medications before your next appointment, please call your pharmacy.

## 2016-02-06 NOTE — Progress Notes (Signed)
Chief Complaint  Patient presents with  . Chest Pain    pt states no chest pain   . Shortness of Breath    some SOB every once in a while      History of Present Illness: 82 yo male with history of CAD, ischemic cardiomyopathy, HTN, HLD, PAF who is here today for cardiac follow up. He was admitted to [facility] 11/07/14 with atrial fib with RVR. LV systolic function was decreased. LVEF noted to be 25-30% with moderate MR by echo 11/07/14. Cardiac cath 11/10/14 with severe LAD stenosis. This was treated with a 2.75 x 18 mm Xience DES x 1. Moderate disease in the RCA. He was brought back for DCCV 12/30/14 and converted to sinus. Repeat echo 01/13/15 with LVEF=45-50%. No MR noted. Admitted January 2017 with shingles and atrial fib with RVR. Toprol increased and Cardizem stopped. Readmitted March 2017 with CHF and atrial fib with RVR. Diuresed and Cardizem restarted for rate control.  Echo March 2017 with LVEF=40%.   He is here today for follow up. He feels great. No chest pain, SOB, palpitations, dizziness, near syncope. Tolerating coumadin.  No bleeding. Only c/o pain from shingles in neck and back of head. Weight and BP stable at home.   Primary Care Physician: Eulas Post, MD  Past Medical History  Diagnosis Date  . Allergy     hay fever, allergies  . Hyperlipidemia   . Hypertension   . CAD (coronary artery disease)     DES to mid LAD 11/10/2014  . Persistent atrial fibrillation ([place])   . Chronic systolic heart failure ([place])   . History of echocardiogram     Echo 3/17: EF 40%, diff HK, trivial AI, trivial MR, mod LAE, mild reduced RVSF, mod RAE    Past Surgical History  Procedure Laterality Date  . Appendectomy  1947  . Left heart catheterization with coronary angiogram N/A 11/10/2014    Procedure: LEFT HEART CATHETERIZATION WITH CORONARY ANGIOGRAM;  Surgeon: Wellington Hampshire, MD;  Location: [facility] CATH LAB;  Service: Cardiovascular;  Laterality: N/A;  . Percutaneous coronary stent  intervention (pci-s)  11/10/2014    Procedure: PERCUTANEOUS CORONARY STENT INTERVENTION (PCI-S);  Surgeon: Wellington Hampshire, MD;  Location: [facility] CATH LAB;  Service: Cardiovascular;;  . Cardioversion N/A 12/30/2014    Procedure: CARDIOVERSION;  Surgeon: Jerline Pain, MD;  Location: [facility] ENDOSCOPY;  Service: Cardiovascular;  Laterality: N/A;    Current Outpatient Prescriptions  Medication Sig Dispense Refill  . acetaminophen (TYLENOL) 500 MG tablet Take 500 mg by mouth every 4 (four) hours as needed for fever (pain).    . Ascorbic Acid (VITAMIN C) 1000 MG tablet Take 1,000 mg by mouth daily.    Marland Kitchen atorvastatin (LIPITOR) 40 MG tablet TAKE 1 TABLET BY MOUTH DAILY AT 6PM 30 tablet 1  . diltiazem (CARDIZEM CD) 120 MG 24 hr capsule Take 1 capsule (120 mg total) by mouth daily. 30 capsule 6  . furosemide (LASIX) 20 MG tablet TAKE ONE TABLET BY MOUTH ONCE DAILY AS NEEDED 30 tablet 1  . furosemide (LASIX) 40 MG tablet Take one tablet (40 mg) once every other day alternating with 1/2 tablet (20 mg) once every other day    . gabapentin (NEURONTIN) 300 MG capsule TAKE ONE CAPSULE BY MOUTH UP TO THREE TIMES DAILY AS NEEDED FOR PAIN 180 capsule 1  . lisinopril (PRINIVIL,ZESTRIL) 5 MG tablet TAKE ONE TABLET BY MOUTH ONCE DAILY 30 tablet 11  . metoprolol (LOPRESSOR) 100  MG tablet Take 1 tablet (100 mg total) by mouth 2 (two) times daily. 180 tablet 3  . potassium chloride (K-DUR) 10 MEQ tablet Take 1 tablet (10 mEq total) by mouth daily. 30 tablet 6  . warfarin (COUMADIN) 5 MG tablet TAKE AS DIRECTED BY MOUTH BY COUMADIN CLINIC 35 tablet 1   No current facility-administered medications for this visit.    No Known Allergies  Social History   Social History  . Marital Status: Married    Spouse Name: N/A  . Number of Children: N/A  . Years of Education: N/A   Occupational History  . Not on file.   Social History Main Topics  . Smoking status: Never Smoker   . Smokeless tobacco: Never Used  . Alcohol  Use: No  . Drug Use: No  . Sexual Activity: Not on file   Other Topics Concern  . Not on file   Social History Narrative    Family History  Problem Relation Age of Onset  . Cancer Mother     breast  . Cancer Father     colon    Review of Systems:  As stated in the HPI and otherwise negative.   BP 142/64 mmHg  Pulse 56  Ht 5' 9"  (1.753 m)  Wt 187 lb 12.8 oz (85.186 kg)  BMI 27.72 kg/m2  SpO2 98%  Physical Examination: General: Well developed, well nourished, NAD HEENT: OP clear, mucus membranes moist SKIN: warm, dry. No rashes. Neuro: No focal deficits Musculoskeletal: Muscle strength 5/5 all ext Psychiatric: Mood and affect normal Neck: No JVD, no carotid bruits, no thyromegaly, no lymphadenopathy. Lungs:Clear bilaterally, no wheezes, rhonci, crackles Cardiovascular: Irreg irreg. No murmurs, gallops or rubs. Abdomen:Soft. Bowel sounds present. Non-tender.  Extremities: No lower extremity edema. Pulses are 2 + in the bilateral DP/PT.  Echo 12/13/15: Left ventricle: The cavity size was normal. Wall thickness was  normal. Indeterminant diastolic function (atrial fibrillation).  The estimated ejection fraction was 40%. Diffuse hypokinesis. - Aortic valve: There was no stenosis. There was trivial  regurgitation. - Mitral valve: There was trivial regurgitation. - Left atrium: The atrium was moderately dilated. - Right ventricle: The cavity size was normal. Systolic function  was mildly reduced. - Right atrium: The atrium was moderately dilated. - Pulmonary arteries: No complete TR doppler jet so unable to  estimate PA systolic pressure. - Inferior vena cava: The vessel was normal in size. The  respirophasic diameter changes were in the normal range (>= 50%),  consistent with normal central venous pressure.  Impressions:  - The patient was in atrial fibrillation. EF 40%, diffuse  hypokinesis. Normal RV size with mildly decreased systolic  function. No  significant valvular abnormalities. Moderate  biatrial enlargement.   Cardiac cath 11/10/14: Coronary dominance: Right   Left Main: Normal  Left Anterior Descending (LAD): Normal in size with minor irregularities in the proximal segment. There is a 90% discrete stenosis in the midsegment just before the origin of the second diagonal. The rest of the vessel has minor irregularities and wraps around the apex.  1st diagonal (D1): Normal in size with 40% proximal stenosis.  2nd diagonal (D2): Normal in size with 20% ostial stenosis.  3rd diagonal (D3): Small in size with minor irregularities.  Circumflex (LCx): Normal in size with minor irregularities.  1st obtuse marginal: Small in size with minor irregularities.  2nd obtuse marginal: Medium in size with minor irregularities.  3rd obtuse marginal: Medium in size with minor irregularities.   Right  Coronary Artery: Large in size and dominant. The vessel has minor irregularities. There is 50% stenosis distally before the bifurcation  Posterior descending artery: Large in size with no significant disease.  Left ventriculography: Left ventricular systolic function is moderately to severely reduced , LVEF is estimated at 30-35 %, there is no significant mitral regurgitation   EKG:  EKG is not ordered today. The ekg ordered today demonstrates  Recent Labs: 11/17/2015: ALT 43; B Natriuretic Peptide 629.5* 11/19/2015: Hemoglobin 14.4; Platelets 231 12/13/2015: BUN 13; Creat 1.17*; Potassium 3.8; Sodium 141   Lipid Panel    Component Value Date/Time   CHOL 124 12/10/2014 1043   TRIG 74 12/10/2014 1043   HDL 53 12/10/2014 1043   CHOLHDL 2.3 12/10/2014 1043   VLDL 15 12/10/2014 1043   LDLCALC 56 12/10/2014 1043   LDLDIRECT 191.1 01/04/2011 1218     Wt Readings from Last 3 Encounters:  02/06/16 187 lb 12.8 oz (85.186 kg)  12/26/15 190 lb 11.2 oz (86.501 kg)  11/29/15 187 lb 11.2 oz (85.14 kg)     Other  studies Reviewed: Additional studies/ records that were reviewed today include:. Review of the above records demonstrates:    Assessment and Plan:   1. CAD: No recent chest pain to suggest angina. Will continue statin and beta blocker. He has been off of ASA and Plavix due to use of coumadin.   2. HTN: BP controlled at home. No changes.   3. Hyperlipidemia: Continue statin. Will check fasting lipids and LFTs now.   4. Atrial fibrillation, persistent: He is in atrial fib. Will continue Cardizem and Toprol. He is on Coumadin.  Will continue rate control strategy. If he needs to hold coumadin for dermatological procedure, this would be ok.   5. Ischemic cardiomyopathy: LVEF=40% by echo March 2017. Continue medical therapy with beta blocker, Ace-inh.  6. Chronic systolic CHF: Will continue daily lasix. Check BMET.   Current medicines are reviewed at length with the patient today.  The patient does not have concerns regarding medicines.  The following changes have been made:  no change  Labs/ tests ordered today include:   Orders Placed This Encounter  Procedures  . Comp Met (CMET)  . Lipid Profile    Disposition:   FU with me in 6 months  Signed, Lauree Chandler, MD 02/06/2016 9:53 AM    [facility] [place], [place], [place]  [address] Phone: [phone]; Fax: [phone]

## 2016-02-08 ENCOUNTER — Telehealth: Payer: Self-pay | Admitting: Cardiovascular Disease

## 2016-02-08 NOTE — Telephone Encounter (Signed)
Thanks

## 2016-02-08 NOTE — Telephone Encounter (Signed)
Returned call to patient's wife.  She states she is not sure how deep the surgical procedure will be and is worried about him bleeding while on warfarin.   Phone number to skin surgery center: [phone]  Informed wife that message will be routed to Dr. Angelena Form & coumadin clinic to advise patient on warfarin/surgery

## 2016-02-08 NOTE — Telephone Encounter (Signed)
Spoke with [facility] in reference to the pt having a procedure with Dr. Levada Dy & they stated that the pt does not have to hold his Coumadin for the excision procedure on 02/16/16 & she stated that they have spoken to the pt's wife this morning regarding this procedure & she was thoroughly explained the risks.  Also, I called the pt & wife to inquire about the situation & she stated they did inform them that the risk of bleeding is low and not to hold his Coumadin.  The wife stated she was worried about bleeding if they have to go deep into the skin.  Also, left a message for the nurse to call in reference to the pt holding the Coumadin if needed & to fax a clearance form over to get approved by pt's Cardiologist.

## 2016-02-08 NOTE — Telephone Encounter (Signed)
Pt scheduled for skin surgery 02-16-16, melanoma, pt worried he will bleed to death, Dr. Levada [facility], pt on Warfarin pls call pt's  wife [name]

## 2016-02-09 ENCOUNTER — Telehealth: Payer: Self-pay | Admitting: *Deleted

## 2016-02-09 NOTE — Telephone Encounter (Signed)
Rachelle called to inform [facility] that the pt does not have to hold his Coumadin for the Excision that he is having on 02/16/16.  Patient & wife have been informed per Rachelle.  Also, she stated the pt will have a MOZE procedure in August & does not have to hold his Coumadin but will need an INR check at least one week before the procedure & faxed to [phone] so that it can be scanned into the pt's chart.

## 2016-02-15 ENCOUNTER — Other Ambulatory Visit (INDEPENDENT_AMBULATORY_CARE_PROVIDER_SITE_OTHER): Payer: Medicare Other | Admitting: *Deleted

## 2016-02-15 DIAGNOSIS — I2589 Other forms of chronic ischemic heart disease: Secondary | ICD-10-CM | POA: Diagnosis not present

## 2016-02-15 DIAGNOSIS — I255 Ischemic cardiomyopathy: Secondary | ICD-10-CM

## 2016-02-15 DIAGNOSIS — E785 Hyperlipidemia, unspecified: Secondary | ICD-10-CM

## 2016-02-15 LAB — COMPREHENSIVE METABOLIC PANEL
ALT: 28 U/L (ref 9–46)
AST: 22 U/L (ref 10–35)
Albumin: 3.8 g/dL (ref 3.6–5.1)
Alkaline Phosphatase: 74 U/L (ref 40–115)
BUN: 15 mg/dL (ref 7–25)
CHLORIDE: 102 mmol/L (ref 98–110)
CO2: 25 mmol/L (ref 20–31)
CREATININE: 1.21 mg/dL — AB (ref 0.70–1.11)
Calcium: 8.4 mg/dL — ABNORMAL LOW (ref 8.6–10.3)
Glucose, Bld: 87 mg/dL (ref 65–99)
POTASSIUM: 4.1 mmol/L (ref 3.5–5.3)
SODIUM: 138 mmol/L (ref 135–146)
Total Bilirubin: 1.3 mg/dL — ABNORMAL HIGH (ref 0.2–1.2)
Total Protein: 6.3 g/dL (ref 6.1–8.1)

## 2016-02-15 LAB — LIPID PANEL
CHOL/HDL RATIO: 2.3 ratio (ref <=5.0)
CHOLESTEROL: 150 mg/dL (ref 125–200)
HDL: 66 mg/dL (ref >=40)
LDL CALC: 64 mg/dL (ref <130)
Triglycerides: 98 mg/dL (ref <150)
VLDL: 20 mg/dL (ref <30)

## 2016-02-18 IMAGING — DX DG CHEST 2V
2 series · 2 of 2 positions shown · non-contrast
Comparison: None.

CLINICAL DATA: Hypertension.  Worsening shortness of Breath

EXAM:
CHEST  2 VIEW

[chest lat]
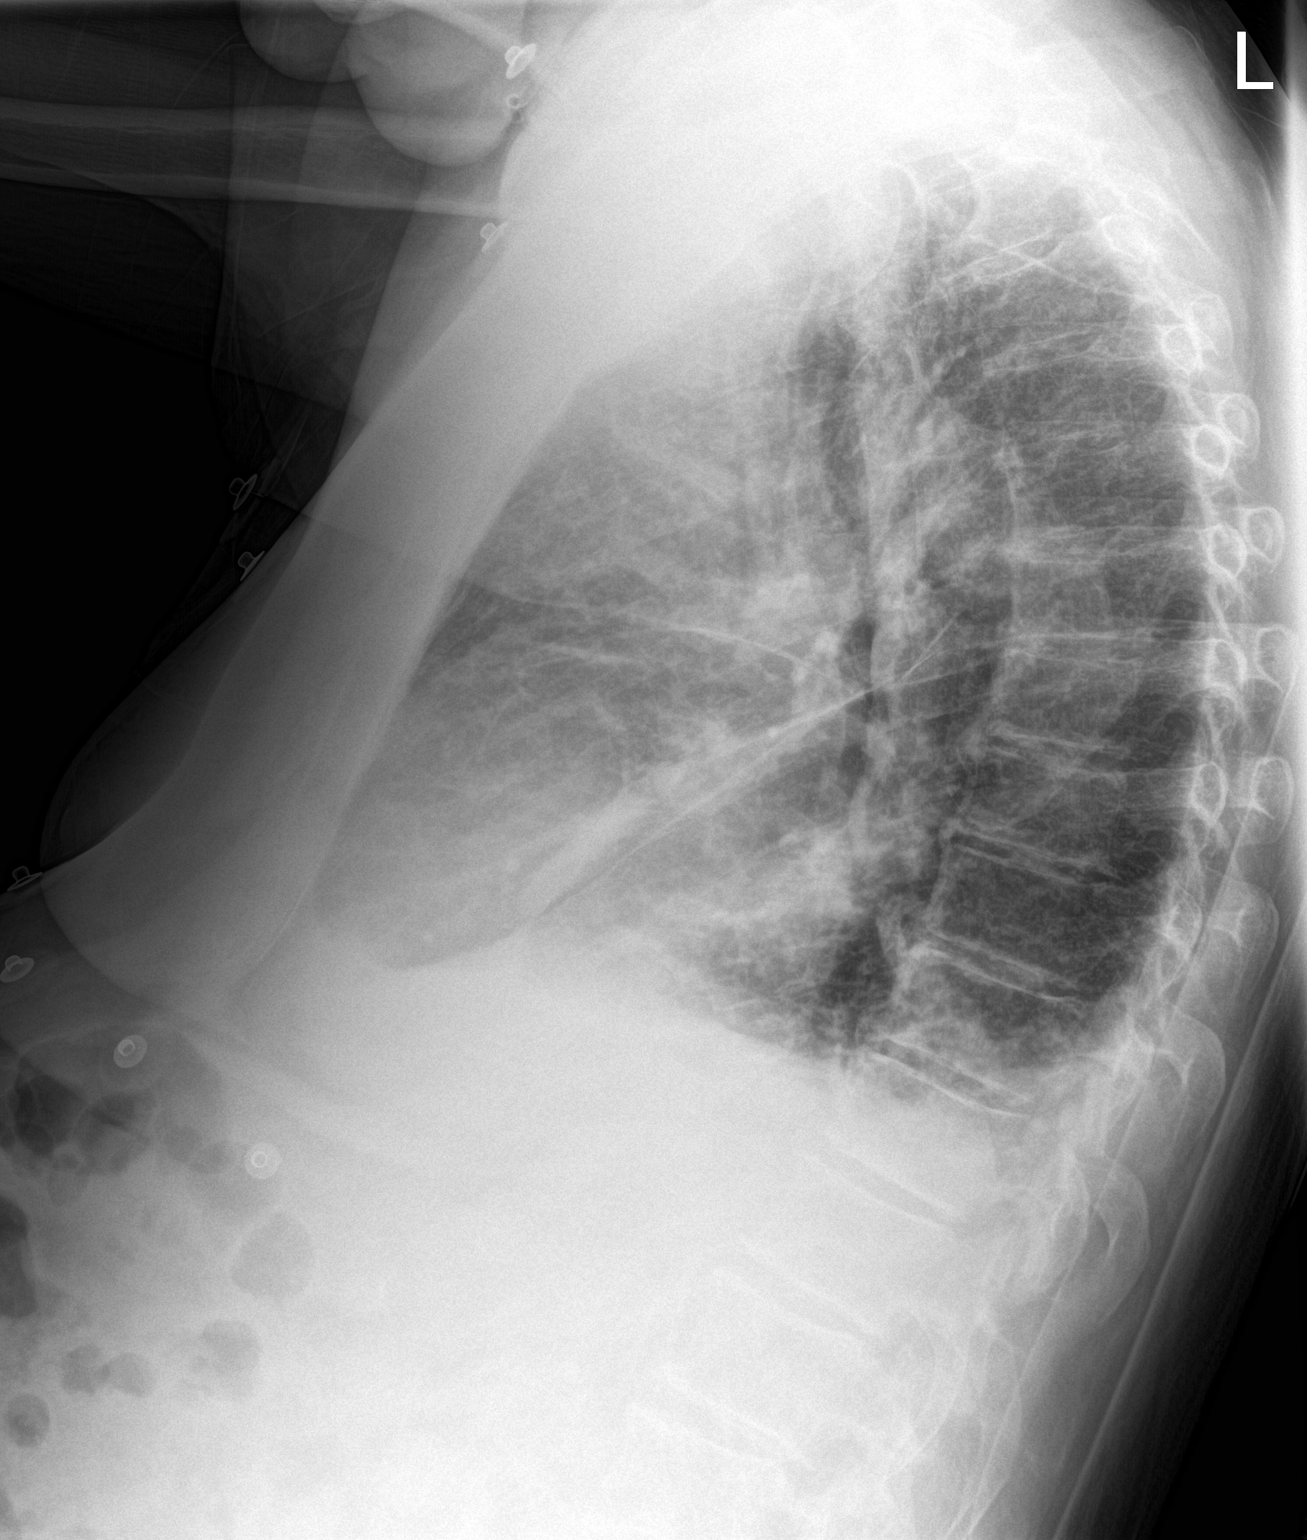

[chest ap]
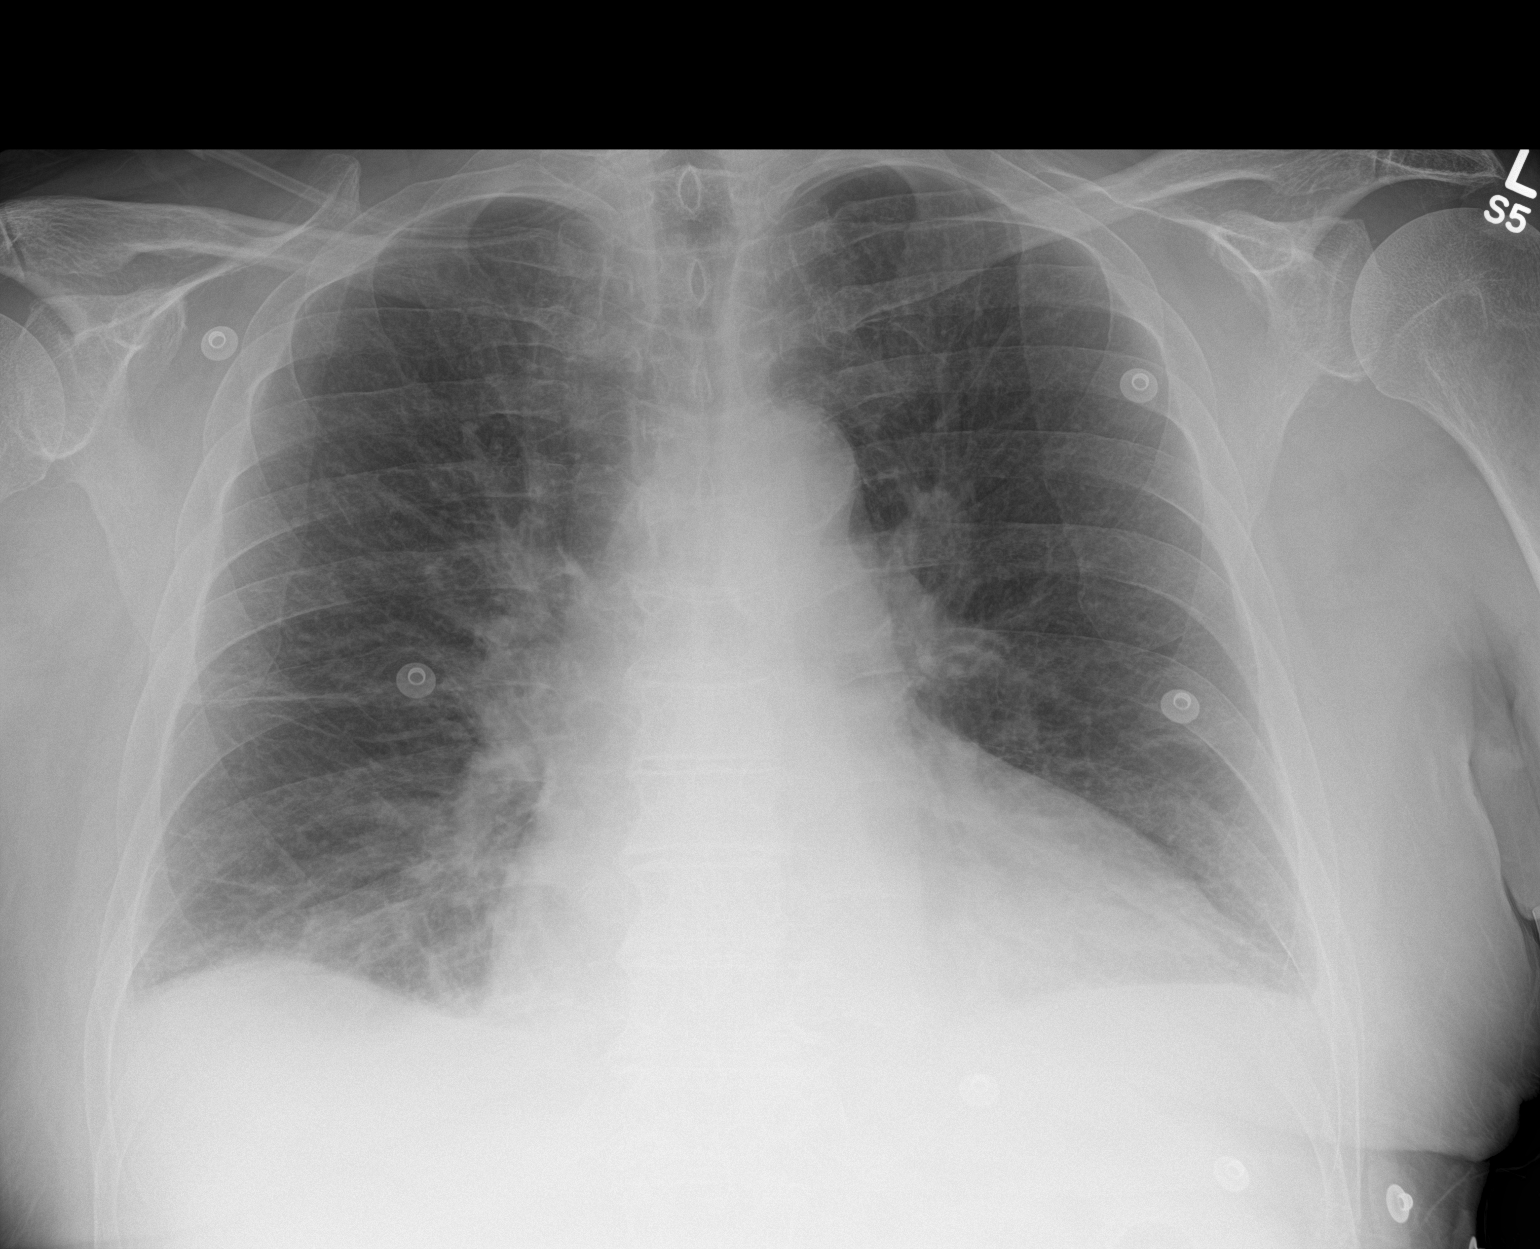

[2 of 2 positions shown; findings below may reference images not displayed]

FINDINGS: There is mild cardiac enlargement. Small bilateral pleural effusions
and mild interstitial edema is identified. Lung volumes are low.
IMPRESSION: Mild CHF.

## 2016-02-22 DIAGNOSIS — D0359 Melanoma in situ of other part of trunk: Secondary | ICD-10-CM | POA: Diagnosis not present

## 2016-02-22 DIAGNOSIS — C44319 Basal cell carcinoma of skin of other parts of face: Secondary | ICD-10-CM | POA: Diagnosis not present

## 2016-03-02 ENCOUNTER — Encounter: Payer: Self-pay | Admitting: Family Medicine

## 2016-03-02 ENCOUNTER — Ambulatory Visit (INDEPENDENT_AMBULATORY_CARE_PROVIDER_SITE_OTHER): Payer: Medicare Other | Admitting: Family Medicine

## 2016-03-02 VITALS — BP 112/60 | HR 68 | Temp 97.9°F | Ht 69.0 in | Wt 190.0 lb

## 2016-03-02 DIAGNOSIS — Z23 Encounter for immunization: Secondary | ICD-10-CM | POA: Diagnosis not present

## 2016-03-02 DIAGNOSIS — B0229 Other postherpetic nervous system involvement: Secondary | ICD-10-CM

## 2016-03-02 DIAGNOSIS — I255 Ischemic cardiomyopathy: Secondary | ICD-10-CM

## 2016-03-02 NOTE — Progress Notes (Signed)
   Subjective:    Patient ID: Ricardo Valencia, male    DOB: [birth year], 82 y.o.   MRN: [id number]  HPI Follow-up postherpetic neuralgia He had extensive shingles involving the right side of the neck Is currently taking gabapentin 300 mg twice daily. Valencia is somewhat intermittent. Definitely much better than was a few months ago. Valencia intensity is usually 4-5 out of 10 at its worst  Recent labs per cardiology and these were reviewed. His chronic problems include atrial fibrillation, ischemic cardiomyopathy, hypertension, dyslipidemia chronic Coumadin. Follow-up scheduled with skin surgery Center for basal cell excisions in August.  No history of documented Pneumovax. He had Prevnar 13 year ago  Past Medical History  Diagnosis Date  . Allergy     hay fever, allergies  . Hyperlipidemia   . Hypertension   . CAD (coronary artery disease)     DES to mid LAD 11/10/2014  . Persistent atrial fibrillation ([place])   . Chronic systolic heart failure ([place])   . History of echocardiogram     Echo 3/17: EF 40%, diff HK, trivial AI, trivial MR, mod LAE, mild reduced RVSF, mod RAE   Past Surgical History  Procedure Laterality Date  . Appendectomy  1947  . Left heart catheterization with coronary angiogram N/A 11/10/2014    Procedure: LEFT HEART CATHETERIZATION WITH CORONARY ANGIOGRAM;  Surgeon: Ricardo Hampshire, MD;  Location: [facility] CATH LAB;  Service: Cardiovascular;  Laterality: N/A;  . Percutaneous coronary stent intervention (pci-s)  11/10/2014    Procedure: PERCUTANEOUS CORONARY STENT INTERVENTION (PCI-S);  Surgeon: Ricardo Hampshire, MD;  Location: [facility] CATH LAB;  Service: Cardiovascular;;  . Cardioversion N/A 12/30/2014    Procedure: CARDIOVERSION;  Surgeon: Ricardo Pain, MD;  Location: [facility] ENDOSCOPY;  Service: Cardiovascular;  Laterality: N/A;    reports that he has never smoked. He has never used smokeless tobacco. He reports that he does not drink alcohol or use illicit drugs. family history  includes Cancer in his father and mother. No Known Allergies     Review of Systems  Constitutional: Negative for fatigue.  Eyes: Negative for visual disturbance.  Respiratory: Negative for cough, chest tightness and shortness of breath.   Cardiovascular: Negative for chest Valencia, palpitations and leg swelling.  Neurological: Negative for dizziness, syncope, weakness, light-headedness and headaches.       Objective:   Physical Exam  Constitutional: He appears well-developed and well-nourished.  Cardiovascular:  Irregular rhythm rate controlled  Pulmonary/Chest: Effort normal and breath sounds normal. No respiratory distress. He has no wheezes. He has no rales.  Musculoskeletal: He exhibits no edema.          Assessment & Plan:  Postherpetic neuralgia. Slowly improving. We've recommended he continue retry to slowly taper off gabapentin. Pneumovax recommended. Routine follow-up one year and sooner as needed  Ricardo Post MD [facility]

## 2016-03-20 ENCOUNTER — Other Ambulatory Visit: Payer: Self-pay | Admitting: Cardiovascular Disease

## 2016-03-23 ENCOUNTER — Ambulatory Visit (INDEPENDENT_AMBULATORY_CARE_PROVIDER_SITE_OTHER): Payer: Medicare Other | Admitting: Pharmacist

## 2016-03-23 DIAGNOSIS — Z5181 Encounter for therapeutic drug level monitoring: Secondary | ICD-10-CM

## 2016-03-23 DIAGNOSIS — I4891 Unspecified atrial fibrillation: Secondary | ICD-10-CM

## 2016-03-23 DIAGNOSIS — Z7901 Long term (current) use of anticoagulants: Secondary | ICD-10-CM

## 2016-03-23 LAB — POCT INR: INR: 3.3

## 2016-04-01 ENCOUNTER — Other Ambulatory Visit: Payer: Self-pay | Admitting: Cardiovascular Disease

## 2016-04-20 ENCOUNTER — Ambulatory Visit (INDEPENDENT_AMBULATORY_CARE_PROVIDER_SITE_OTHER): Payer: Medicare Other | Admitting: Pharmacist

## 2016-04-20 DIAGNOSIS — Z7901 Long term (current) use of anticoagulants: Secondary | ICD-10-CM

## 2016-04-20 DIAGNOSIS — I4891 Unspecified atrial fibrillation: Secondary | ICD-10-CM

## 2016-04-20 DIAGNOSIS — Z5181 Encounter for therapeutic drug level monitoring: Secondary | ICD-10-CM

## 2016-04-20 LAB — POCT INR: INR: 2.6

## 2016-05-18 ENCOUNTER — Ambulatory Visit (INDEPENDENT_AMBULATORY_CARE_PROVIDER_SITE_OTHER): Payer: Medicare Other | Admitting: *Deleted

## 2016-05-18 DIAGNOSIS — Z5181 Encounter for therapeutic drug level monitoring: Secondary | ICD-10-CM

## 2016-05-18 DIAGNOSIS — I4891 Unspecified atrial fibrillation: Secondary | ICD-10-CM

## 2016-05-18 DIAGNOSIS — Z7901 Long term (current) use of anticoagulants: Secondary | ICD-10-CM | POA: Diagnosis not present

## 2016-05-18 LAB — POCT INR: INR: 4.2

## 2016-05-31 ENCOUNTER — Other Ambulatory Visit: Payer: Self-pay | Admitting: Cardiovascular Disease

## 2016-06-01 ENCOUNTER — Other Ambulatory Visit: Payer: Self-pay

## 2016-06-01 MED ORDER — FUROSEMIDE 20 MG PO TABS: ORAL_TABLET | ORAL | 3 refills | Status: DC

## 2016-06-08 ENCOUNTER — Ambulatory Visit (INDEPENDENT_AMBULATORY_CARE_PROVIDER_SITE_OTHER): Payer: Medicare Other | Admitting: *Deleted

## 2016-06-08 DIAGNOSIS — I4891 Unspecified atrial fibrillation: Secondary | ICD-10-CM

## 2016-06-08 DIAGNOSIS — Z7901 Long term (current) use of anticoagulants: Secondary | ICD-10-CM | POA: Diagnosis not present

## 2016-06-08 DIAGNOSIS — Z5181 Encounter for therapeutic drug level monitoring: Secondary | ICD-10-CM

## 2016-06-08 LAB — POCT INR: INR: 2.4

## 2016-06-23 ENCOUNTER — Inpatient Hospital Stay (HOSPITAL_COMMUNITY)
Admission: EM | Admit: 2016-06-23 | Discharge: 2016-06-25 | DRG: 281 | Disposition: A | Discharge status: Home / Self Care | Payer: Medicare Other | Attending: Internal Medicine | Admitting: Internal Medicine

## 2016-06-23 ENCOUNTER — Emergency Department (HOSPITAL_COMMUNITY): Payer: Medicare Other

## 2016-06-23 ENCOUNTER — Encounter (HOSPITAL_COMMUNITY): Payer: Self-pay | Admitting: *Deleted

## 2016-06-23 DIAGNOSIS — R0602 Shortness of breath: Secondary | ICD-10-CM | POA: Diagnosis not present

## 2016-06-23 DIAGNOSIS — I4819 Other persistent atrial fibrillation: Secondary | ICD-10-CM | POA: Diagnosis present

## 2016-06-23 DIAGNOSIS — I214 Non-ST elevation (NSTEMI) myocardial infarction: Principal | ICD-10-CM

## 2016-06-23 DIAGNOSIS — I255 Ischemic cardiomyopathy: Secondary | ICD-10-CM | POA: Diagnosis not present

## 2016-06-23 DIAGNOSIS — I5042 Chronic combined systolic (congestive) and diastolic (congestive) heart failure: Secondary | ICD-10-CM | POA: Diagnosis not present

## 2016-06-23 DIAGNOSIS — I481 Persistent atrial fibrillation: Secondary | ICD-10-CM | POA: Diagnosis present

## 2016-06-23 DIAGNOSIS — I2510 Atherosclerotic heart disease of native coronary artery without angina pectoris: Secondary | ICD-10-CM | POA: Diagnosis not present

## 2016-06-23 DIAGNOSIS — I5022 Chronic systolic (congestive) heart failure: Secondary | ICD-10-CM | POA: Diagnosis present

## 2016-06-23 DIAGNOSIS — I110 Hypertensive heart disease with heart failure: Secondary | ICD-10-CM | POA: Diagnosis present

## 2016-06-23 DIAGNOSIS — Z7901 Long term (current) use of anticoagulants: Secondary | ICD-10-CM

## 2016-06-23 DIAGNOSIS — E785 Hyperlipidemia, unspecified: Secondary | ICD-10-CM | POA: Diagnosis present

## 2016-06-23 DIAGNOSIS — I208 Other forms of angina pectoris: Secondary | ICD-10-CM | POA: Diagnosis not present

## 2016-06-23 DIAGNOSIS — I10 Essential (primary) hypertension: Secondary | ICD-10-CM | POA: Diagnosis not present

## 2016-06-23 DIAGNOSIS — Z803 Family history of malignant neoplasm of breast: Secondary | ICD-10-CM

## 2016-06-23 DIAGNOSIS — Z800 Family history of malignant neoplasm of digestive organs: Secondary | ICD-10-CM

## 2016-06-23 DIAGNOSIS — I209 Angina pectoris, unspecified: Secondary | ICD-10-CM | POA: Diagnosis not present

## 2016-06-23 LAB — BASIC METABOLIC PANEL
Anion gap: 10 (ref 5–15)
BUN: 8 mg/dL (ref 6–20)
CHLORIDE: 103 mmol/L (ref 101–111)
CO2: 25 mmol/L (ref 22–32)
CREATININE: 1.13 mg/dL (ref 0.61–1.24)
Calcium: 9 mg/dL (ref 8.9–10.3)
GFR calc Af Amer: >60 mL/min (ref >60)
GFR calc non Af Amer: 58 mL/min — ABNORMAL LOW (ref >60)
GLUCOSE: 98 mg/dL (ref 65–99)
POTASSIUM: 3.9 mmol/L (ref 3.5–5.1)
SODIUM: 138 mmol/L (ref 135–145)

## 2016-06-23 LAB — CBC
HEMATOCRIT: 46.1 % (ref 39.0–52.0)
Hemoglobin: 15.8 g/dL (ref 13.0–17.0)
MCH: 32.4 pg (ref 26.0–34.0)
MCHC: 34.3 g/dL (ref 30.0–36.0)
MCV: 94.7 fL (ref 78.0–100.0)
PLATELETS: 146 10*3/uL — AB (ref 150–400)
RBC: 4.87 MIL/uL (ref 4.22–5.81)
RDW: 14.3 % (ref 11.5–15.5)
WBC: 7.4 10*3/uL (ref 4.0–10.5)

## 2016-06-23 LAB — TROPONIN I: Troponin I: 0.05 ng/mL (ref <0.03)

## 2016-06-23 LAB — PROTIME-INR
INR: 2.91
Prothrombin Time: 31 seconds — ABNORMAL HIGH (ref 11.4–15.2)

## 2016-06-23 LAB — BRAIN NATRIURETIC PEPTIDE: B Natriuretic Peptide: 505.9 pg/mL — ABNORMAL HIGH (ref 0.0–100.0)

## 2016-06-23 NOTE — ED Provider Notes (Signed)
[facility] DEPT Provider Note   CSN: [id number] Arrival date & time: 06/23/16  2217  By signing my name below, I, Ricardo Valencia, attest that this documentation has been prepared under the direction and in the presence of Jeanie Mccard, MD. Electronically Signed: Hansel Valencia, ED Scribe. 06/23/16. 11:51 PM.     History   Chief Complaint Chief Complaint  Patient presents with  . Shortness of Breath    HPI Ricardo Valencia is a 82 y.o. male with h/o Afib, CAD who presents to the Emergency Department complaining of sudden onset, gradually worsening SOB onset this afternoon while watching TV after returning inside from his yard. Pt states associated increased BLE edema from baseline. He also notes increased congestion for the last week, which has not worsened this evening. Pt states he feels as though he feels like he cannot get a deep breath. He reports that his symptoms are improved when sitting up and worsened when lying flat. He reports recent h/o similar symptoms which resulted in angioplasty. He does not use an inhaler at home. No recent changes in medications. Pt denies wheezing, coughing, palpitations, CP, vomiting, fever, neck pain, back pain, shoulder pain, arm pain.    The history is provided by the patient. No language interpreter was used.  Shortness of Breath  This is a recurrent problem. The problem occurs continuously.The current episode started 6 to 12 hours ago. The problem has been gradually worsening. Associated symptoms include leg swelling. Pertinent negatives include no fever, no neck pain, no cough, no wheezing, no chest pain, no vomiting, no abdominal pain, no rash and no leg pain. It is unknown what precipitated the problem. Risk factors: none, states last time he felt this way he had an angioplasty. He has tried nothing for the symptoms. The treatment provided no relief. He has had prior hospitalizations. He has had prior ED visits. Associated medical issues include  CAD. Associated medical issues do not include asthma.    Past Medical History:  Diagnosis Date  . Allergy    hay fever, allergies  . CAD (coronary artery disease)    DES to mid LAD 11/10/2014  . Chronic systolic heart failure ([place])   . History of echocardiogram    Echo 3/17: EF 40%, diff HK, trivial AI, trivial MR, mod LAE, mild reduced RVSF, mod RAE  . Hyperlipidemia   . Hypertension   . Persistent atrial fibrillation [facility])     Patient Active Problem List   Diagnosis Date Noted  . Long term (current) use of anticoagulants [Z79.01] 11/29/2015  . Persistent atrial fibrillation ([place])   . Chronic systolic heart failure ([place]) 11/17/2015  . Basal cell carcinoma 10/20/2015  . Herpes zoster   . Encounter for therapeutic drug monitoring 02/15/2015  . Ischemic cardiomyopathy 11/25/2014  . CAD (coronary artery disease)   . Hypertension 01/18/2011  . Hyperlipidemia 01/18/2011    Past Surgical History:  Procedure Laterality Date  . APPENDECTOMY  1947  . CARDIOVERSION N/A 12/30/2014   Procedure: CARDIOVERSION;  Surgeon: Jerline Pain, MD;  Location: [facility];  Service: Cardiovascular;  Laterality: N/A;  . LEFT HEART CATHETERIZATION WITH CORONARY ANGIOGRAM N/A 11/10/2014   Procedure: LEFT HEART CATHETERIZATION WITH CORONARY ANGIOGRAM;  Surgeon: Wellington Hampshire, MD;  Location: [facility] CATH LAB;  Service: Cardiovascular;  Laterality: N/A;  . PERCUTANEOUS CORONARY STENT INTERVENTION (PCI-S)  11/10/2014   Procedure: PERCUTANEOUS CORONARY STENT INTERVENTION (PCI-S);  Surgeon: Wellington Hampshire, MD;  Location: [facility] CATH LAB;  Service: Cardiovascular;;  Home Medications    Prior to Admission medications   Medication Sig Start Date End Date Taking? Authorizing Provider  acetaminophen (TYLENOL) 500 MG tablet Take 500 mg by mouth every 4 (four) hours as needed for fever (pain).    Historical Provider, MD  Ascorbic Acid (VITAMIN C) 1000 MG tablet Take 1,000 mg by mouth daily.    Historical  Provider, MD  atorvastatin (LIPITOR) 40 MG tablet TAKE ONE TABLET BY MOUTH DAILY AT 6PM. 03/21/16   Burnell Blanks, MD  diltiazem (CARDIZEM CD) 120 MG 24 hr capsule Take 1 capsule (120 mg total) by mouth daily. 11/19/15   Eileen Stanford, PA-C  furosemide (LASIX) 20 MG tablet TAKE ONE TABLET BY MOUTH ONCE DAILY AS NEEDED 06/01/16   Burnell Blanks, MD  furosemide (LASIX) 40 MG tablet Take one tablet (40 mg) once every other day alternating with 1/2 tablet (20 mg) once every other day    Historical Provider, MD  gabapentin (NEURONTIN) 300 MG capsule TAKE ONE CAPSULE BY MOUTH UP TO THREE TIMES DAILY AS NEEDED FOR PAIN 11/25/15   Eulas Post, MD  lisinopril (PRINIVIL,ZESTRIL) 5 MG tablet TAKE ONE TABLET BY MOUTH ONCE DAILY 11/16/15   Burnell Blanks, MD  metoprolol (LOPRESSOR) 100 MG tablet Take 1 tablet (100 mg total) by mouth 2 (two) times daily. 10/31/15   Eileen Stanford, PA-C  potassium chloride (K-DUR) 10 MEQ tablet Take 1 tablet (10 mEq total) by mouth daily. 11/19/15   Eileen Stanford, PA-C  warfarin (COUMADIN) 5 MG tablet TAKE BY MOUTH AS DIRECTED BY COUMADIN CLINIC 04/02/16   Burnell Blanks, MD    Family History Family History  Problem Relation Age of Onset  . Cancer Mother     breast  . Cancer Father     colon    Social History Social History  Substance Use Topics  . Smoking status: Never Smoker  . Smokeless tobacco: Never Used  . Alcohol use No     Allergies   Review of patient's allergies indicates no known allergies.   Review of Systems Review of Systems  Constitutional: Negative for fever.  HENT: Positive for congestion.   Respiratory: Positive for shortness of breath. Negative for cough and wheezing.   Cardiovascular: Positive for leg swelling. Negative for chest pain and palpitations.  Gastrointestinal: Negative for abdominal pain, nausea and vomiting.  Musculoskeletal: Negative for back pain and neck pain.  Skin: Negative for  rash.  All other systems reviewed and are negative.    Physical Exam Updated Vital Signs There were no vitals taken for this visit.  Physical Exam  Constitutional: He is oriented to person, place, and time. He appears well-developed and well-nourished.  HENT:  Head: Normocephalic and atraumatic.  Mouth/Throat: Oropharynx is clear and moist. No oropharyngeal exudate.  Moist mucous membranes   Eyes: Conjunctivae and EOM are normal. Pupils are equal, round, and reactive to light.  Neck: Normal range of motion. Neck supple. No JVD present. No tracheal deviation present.  No carotid bruits. Trachea midline.   Cardiovascular: Normal rate.  An irregularly irregular rhythm present. Exam reveals no gallop and no friction rub.   Murmur heard. Irregularly irregular Murmur 2/6 systolic, longstanding.   Pulmonary/Chest: Effort normal and breath sounds normal. No stridor. No respiratory distress. He has no wheezes. He has no rales.  Lungs CTA bilaterally.   Abdominal: Soft. He exhibits no distension. There is no tenderness. There is no rebound and no guarding.  Hyperactive  bowel sounds throughout abdominal cavity.   Musculoskeletal: Normal range of motion.  Good DTRs. Intact DPs. Ankle edema. Compartments soft above that. No edema higher.   Lymphadenopathy:    He has no cervical adenopathy.  Neurological: He is alert and oriented to person, place, and time. He has normal reflexes.  Skin: Skin is warm and dry.  Psychiatric: He has a normal mood and affect.  Nursing note and vitals reviewed.    ED Treatments / Results   Vitals:   06/24/16 0015 06/24/16 0030  BP: 145/95 134/76  Pulse: 60 (!) 44  Resp: 16 16    DIAGNOSTIC STUDIES:  COORDINATION OF CARE: 11:50 PM Discussed treatment plan with pt at bedside which includes cardiology consult, CXR and pt agreed to plan.   12:28 AM Case discussed with cardiology fellow, who recommends giving pt 324 mg ASA, oxygen and hospital admission.    Labs (all labs ordered are listed, but only abnormal results are displayed) Labs Reviewed  BASIC METABOLIC PANEL - Abnormal; Notable for the following:       Result Value   GFR calc non Af Amer 58 (*)    All other components within normal limits  CBC - Abnormal; Notable for the following:    Platelets 146 (*)    All other components within normal limits  TROPONIN I - Abnormal; Notable for the following:    Troponin I 0.05 (*)    All other components within normal limits  BRAIN NATRIURETIC PEPTIDE - Abnormal; Notable for the following:    B Natriuretic Peptide 505.9 (*)    All other components within normal limits  PROTIME-INR - Abnormal; Notable for the following:    Prothrombin Time 31.0 (*)    All other components within normal limits   Results for orders placed or performed during the hospital encounter of [id number]  Basic metabolic panel  Result Value Ref Range   Sodium 138 135 - 145 mmol/L   Potassium 3.9 3.5 - 5.1 mmol/L   Chloride 103 101 - 111 mmol/L   CO2 25 22 - 32 mmol/L   Glucose, Bld 98 65 - 99 mg/dL   BUN 8 6 - 20 mg/dL   Creatinine, Ser 1.13 0.61 - 1.24 mg/dL   Calcium 9.0 8.9 - 10.3 mg/dL   GFR calc non Af Amer 58 (L) >60 mL/min   GFR calc Af Amer >60 >60 mL/min   Anion gap 10 5 - 15  CBC  Result Value Ref Range   WBC 7.4 4.0 - 10.5 K/uL   RBC 4.87 4.22 - 5.81 MIL/uL   Hemoglobin 15.8 13.0 - 17.0 g/dL   HCT 46.1 39.0 - 52.0 %   MCV 94.7 78.0 - 100.0 fL   MCH 32.4 26.0 - 34.0 pg   MCHC 34.3 30.0 - 36.0 g/dL   RDW 14.3 11.5 - 15.5 %   Platelets 146 (L) 150 - 400 K/uL  Troponin I  Result Value Ref Range   Troponin I 0.05 (HH) <0.03 ng/mL  Brain natriuretic peptide  Result Value Ref Range   B Natriuretic Peptide 505.9 (H) 0.0 - 100.0 pg/mL  Protime-INR  Result Value Ref Range   Prothrombin Time 31.0 (H) 11.4 - 15.2 seconds   INR 2.91    Dg Chest 2 View  Result Date: 06/23/2016 CLINICAL DATA:  Acute onset of shortness of breath. Atrial  fibrillation. Initial encounter. EXAM: CHEST  2 VIEW COMPARISON:  Chest radiograph performed 11/17/2015 FINDINGS: The lungs are well-aerated. Left  basilar scarring is noted. Mild peribronchial thickening is seen. No pleural effusion or pneumothorax is identified. The heart is borderline normal in size. No acute osseous abnormalities are seen. IMPRESSION: Left basilar scarring noted.  Mild peribronchial thickening seen. Electronically Signed   By: Garald Balding M.D.   On: 06/23/2016 22:50    EKG  EKG Interpretation  Date/Time:  Saturday June 23 2016 22:23:35 EDT Ventricular Rate:  68 PR Interval:    QRS Duration: 78 QT Interval:  394 QTC Calculation: 418 R Axis:   90 Text Interpretation:  Atrial fibrillation Rightward axis Low voltage QRS Cannot rule out Anteroseptal infarct , age undetermined Abnormal ECG Confirmed by Alvino Chapel  MD, NATHAN [phone]) on 06/23/2016 11:20:32 PM       Radiology Dg Chest 2 View  Result Date: 06/23/2016 CLINICAL DATA:  Acute onset of shortness of breath. Atrial fibrillation. Initial encounter. EXAM: CHEST  2 VIEW COMPARISON:  Chest radiograph performed 11/17/2015 FINDINGS: The lungs are well-aerated. Left basilar scarring is noted. Mild peribronchial thickening is seen. No pleural effusion or pneumothorax is identified. The heart is borderline normal in size. No acute osseous abnormalities are seen. IMPRESSION: Left basilar scarring noted.  Mild peribronchial thickening seen. Electronically Signed   By: Garald Balding M.D.   On: 06/23/2016 22:50    Procedures Procedures (including critical care time)  Medications Ordered in ED Medications - No data to display   Initial Impression / Assessment and Plan / ED Course  I have reviewed the triage vital signs and the nursing notes.  Pertinent labs & imaging results that were available during my care of the patient were reviewed by me and considered in my medical decision making (see chart for  details).   Final Clinical Impressions(s) / ED Diagnoses   Medications  aspirin chewable tablet 324 mg (not administered)  placed on O2  New Prescriptions New Prescriptions   No medications on file  To be seen by cardiology as patient states this is his anginal equivalent  I personally performed the services described in this documentation, which was scribed in my presence. The recorded information has been reviewed and is accurate.       Veatrice Kells, MD 06/24/16 [phone]

## 2016-06-23 NOTE — ED Notes (Signed)
Wife up to desk asking for update, provided

## 2016-06-23 NOTE — ED Triage Notes (Signed)
The pt is c/o sob this afternoon he has  A hx of af chronically he is on coumadin  He has had some swelling in ankle and lt ankle today

## 2016-06-24 ENCOUNTER — Encounter (HOSPITAL_COMMUNITY): Payer: Self-pay | Admitting: Emergency Medicine

## 2016-06-24 DIAGNOSIS — I2510 Atherosclerotic heart disease of native coronary artery without angina pectoris: Secondary | ICD-10-CM | POA: Diagnosis not present

## 2016-06-24 DIAGNOSIS — Z7901 Long term (current) use of anticoagulants: Secondary | ICD-10-CM | POA: Diagnosis not present

## 2016-06-24 DIAGNOSIS — I10 Essential (primary) hypertension: Secondary | ICD-10-CM | POA: Diagnosis not present

## 2016-06-24 DIAGNOSIS — I110 Hypertensive heart disease with heart failure: Secondary | ICD-10-CM | POA: Diagnosis present

## 2016-06-24 DIAGNOSIS — I4891 Unspecified atrial fibrillation: Secondary | ICD-10-CM | POA: Diagnosis not present

## 2016-06-24 DIAGNOSIS — Z800 Family history of malignant neoplasm of digestive organs: Secondary | ICD-10-CM | POA: Diagnosis not present

## 2016-06-24 DIAGNOSIS — I208 Other forms of angina pectoris: Secondary | ICD-10-CM | POA: Diagnosis not present

## 2016-06-24 DIAGNOSIS — I5042 Chronic combined systolic (congestive) and diastolic (congestive) heart failure: Secondary | ICD-10-CM | POA: Diagnosis present

## 2016-06-24 DIAGNOSIS — I5022 Chronic systolic (congestive) heart failure: Secondary | ICD-10-CM | POA: Diagnosis not present

## 2016-06-24 DIAGNOSIS — R0602 Shortness of breath: Secondary | ICD-10-CM | POA: Diagnosis not present

## 2016-06-24 DIAGNOSIS — I255 Ischemic cardiomyopathy: Secondary | ICD-10-CM | POA: Diagnosis not present

## 2016-06-24 DIAGNOSIS — Z803 Family history of malignant neoplasm of breast: Secondary | ICD-10-CM | POA: Diagnosis not present

## 2016-06-24 DIAGNOSIS — I214 Non-ST elevation (NSTEMI) myocardial infarction: Principal | ICD-10-CM

## 2016-06-24 DIAGNOSIS — I481 Persistent atrial fibrillation: Secondary | ICD-10-CM | POA: Diagnosis present

## 2016-06-24 DIAGNOSIS — E785 Hyperlipidemia, unspecified: Secondary | ICD-10-CM | POA: Diagnosis present

## 2016-06-24 LAB — BASIC METABOLIC PANEL
Anion gap: 10 (ref 5–15)
BUN: 8 mg/dL (ref 6–20)
CHLORIDE: 103 mmol/L (ref 101–111)
CO2: 25 mmol/L (ref 22–32)
CREATININE: 1.09 mg/dL (ref 0.61–1.24)
Calcium: 8.8 mg/dL — ABNORMAL LOW (ref 8.9–10.3)
GFR calc Af Amer: >60 mL/min (ref >60)
GFR calc non Af Amer: >60 mL/min (ref >60)
Glucose, Bld: 93 mg/dL (ref 65–99)
Potassium: 4.3 mmol/L (ref 3.5–5.1)
Sodium: 138 mmol/L (ref 135–145)

## 2016-06-24 LAB — CBC
HCT: 41.3 % (ref 39.0–52.0)
Hemoglobin: 15.2 g/dL (ref 13.0–17.0)
MCH: 35.9 pg — AB (ref 26.0–34.0)
MCHC: 36.8 g/dL — AB (ref 30.0–36.0)
MCV: 97.6 fL (ref 78.0–100.0)
PLATELETS: 133 10*3/uL — AB (ref 150–400)
RBC: 4.23 MIL/uL (ref 4.22–5.81)
RDW: 14.4 % (ref 11.5–15.5)
WBC: 7.6 10*3/uL (ref 4.0–10.5)

## 2016-06-24 LAB — PROTIME-INR
INR: 3.03
Prothrombin Time: 32.1 seconds — ABNORMAL HIGH (ref 11.4–15.2)

## 2016-06-24 LAB — TROPONIN I
TROPONIN I: 0.06 ng/mL — AB (ref <0.03)
Troponin I: 0.05 ng/mL (ref <0.03)
Troponin I: 0.06 ng/mL (ref <0.03)

## 2016-06-24 MED ORDER — ASPIRIN 81 MG PO CHEW
324.0000 mg | CHEWABLE_TABLET | Freq: Once | ORAL | Status: AC
  Administered 2016-06-24: 324 mg via ORAL
  Filled 2016-06-24: qty 4

## 2016-06-24 MED ORDER — DILTIAZEM HCL ER COATED BEADS 120 MG PO CP24: 120.0000 mg | ORAL_CAPSULE | Freq: Every day | ORAL | Status: DC

## 2016-06-24 MED ORDER — FUROSEMIDE 40 MG PO TABS
40.0000 mg | ORAL_TABLET | Freq: Every day | ORAL | Status: DC
  Administered 2016-06-25: 40 mg via ORAL
  Filled 2016-06-24: qty 1

## 2016-06-24 MED ORDER — FUROSEMIDE 10 MG/ML IJ SOLN
60.0000 mg | Freq: Once | INTRAMUSCULAR | Status: AC
  Administered 2016-06-24: 60 mg via INTRAVENOUS
  Filled 2016-06-24: qty 6

## 2016-06-24 MED ORDER — ACETAMINOPHEN 500 MG PO TABS: 500.0000 mg | ORAL_TABLET | ORAL | Status: DC | PRN

## 2016-06-24 MED ORDER — METOPROLOL TARTRATE 25 MG PO TABS
100.0000 mg | ORAL_TABLET | Freq: Two times a day (BID) | ORAL | Status: DC
  Administered 2016-06-24 – 2016-06-25 (×3): 100 mg via ORAL
  Filled 2016-06-24 (×3): qty 4

## 2016-06-24 MED ORDER — ATORVASTATIN CALCIUM 40 MG PO TABS
40.0000 mg | ORAL_TABLET | Freq: Every day | ORAL | Status: DC
  Administered 2016-06-24: 40 mg via ORAL
  Filled 2016-06-24 (×2): qty 1

## 2016-06-24 MED ORDER — VITAMIN C 500 MG PO TABS
1000.0000 mg | ORAL_TABLET | Freq: Every day | ORAL | Status: DC
  Administered 2016-06-24 – 2016-06-25 (×2): 1000 mg via ORAL
  Filled 2016-06-24 (×2): qty 2

## 2016-06-24 MED ORDER — NITROGLYCERIN 0.4 MG SL SUBL: 0.4000 mg | SUBLINGUAL_TABLET | SUBLINGUAL | Status: DC | PRN

## 2016-06-24 MED ORDER — ASPIRIN EC 81 MG PO TBEC
81.0000 mg | DELAYED_RELEASE_TABLET | Freq: Every day | ORAL | Status: DC
  Administered 2016-06-25: 81 mg via ORAL
  Filled 2016-06-24: qty 1

## 2016-06-24 MED ORDER — ONDANSETRON HCL 4 MG/2ML IJ SOLN: 4.0000 mg | Freq: Four times a day (QID) | INTRAMUSCULAR | Status: DC | PRN

## 2016-06-24 MED ORDER — FUROSEMIDE 20 MG PO TABS: 40.0000 mg | ORAL_TABLET | Freq: Every day | ORAL | Status: DC

## 2016-06-24 MED ORDER — LISINOPRIL 10 MG PO TABS
5.0000 mg | ORAL_TABLET | Freq: Every day | ORAL | Status: DC
  Administered 2016-06-24 – 2016-06-25 (×2): 5 mg via ORAL
  Filled 2016-06-24 (×2): qty 1

## 2016-06-24 MED ORDER — GABAPENTIN 300 MG PO CAPS: 300.0000 mg | ORAL_CAPSULE | Freq: Three times a day (TID) | ORAL | Status: DC | PRN

## 2016-06-24 NOTE — ED Notes (Signed)
Attempted to call report

## 2016-06-24 NOTE — Progress Notes (Signed)
Subjective: No CP  Breathing is fair   Objective: Vitals:   06/24/16 0145 06/24/16 0215 06/24/16 0300 06/24/16 0339  BP: 142/71 132/91 122/68 (!) 158/77  Pulse: 73 76 102   Resp: 24 20 23 20   Temp:    98.1 F (36.7 C)  TempSrc:    Oral  SpO2: 97% 97% 96% 96%  Weight:    190 lb 12.8 oz (86.5 kg)  Height:    5\' 9"  (1.753 m)   Weight change:  No intake or output data in the 24 hours ending 06/24/16 [id number]  General: Alert, awake, oriented x3, in no acute distress Neck:  JVP is mildly increased   Heart: Irr  egular rate and rhythm, without murmurs, rubs, gallops.  Lungs: Clear to auscultation.  No rales or wheezes. Exemities:  No edema.   Neuro: Grossly intact, nonfocal.  Tel  Afib  18s    Lab Results: Results for orders placed or performed during the hospital encounter of 06/23/16 (from the past 24 hour(s))  Basic metabolic panel     Status: Abnormal   Collection Time: 06/23/16 10:33 PM  Result Value Ref Range   Sodium 138 135 - 145 mmol/L   Potassium 3.9 3.5 - 5.1 mmol/L   Chloride 103 101 - 111 mmol/L   CO2 25 22 - 32 mmol/L   Glucose, Bld 98 65 - 99 mg/dL   BUN 8 6 - 20 mg/dL   Creatinine, Ser 1.13 0.61 - 1.24 mg/dL   Calcium 9.0 8.9 - 10.3 mg/dL   GFR calc non Af Amer 58 (L) >60 mL/min   GFR calc Af Amer >60 >60 mL/min   Anion gap 10 5 - 15  CBC     Status: Abnormal   Collection Time: 06/23/16 10:33 PM  Result Value Ref Range   WBC 7.4 4.0 - 10.5 K/uL   RBC 4.87 4.22 - 5.81 MIL/uL   Hemoglobin 15.8 13.0 - 17.0 g/dL   HCT 46.1 39.0 - 52.0 %   MCV 94.7 78.0 - 100.0 fL   MCH 32.4 26.0 - 34.0 pg   MCHC 34.3 30.0 - 36.0 g/dL   RDW 14.3 11.5 - 15.5 %   Platelets 146 (L) 150 - 400 K/uL  Troponin I     Status: Abnormal   Collection Time: 06/23/16 10:33 PM  Result Value Ref Range   Troponin I 0.05 (HH) <0.03 ng/mL  Brain natriuretic peptide     Status: Abnormal   Collection Time: 06/23/16 10:33 PM  Result Value Ref Range   B Natriuretic Peptide 505.9 (H) 0.0 -  100.0 pg/mL  Protime-INR     Status: Abnormal   Collection Time: 06/23/16 10:33 PM  Result Value Ref Range   Prothrombin Time 31.0 (H) 11.4 - 15.2 seconds   INR 2.91   Troponin I     Status: Abnormal   Collection Time: 06/24/16  3:48 AM  Result Value Ref Range   Troponin I 0.06 (HH) <0.03 ng/mL  Basic metabolic panel     Status: Abnormal   Collection Time: 06/24/16  3:48 AM  Result Value Ref Range   Sodium 138 135 - 145 mmol/L   Potassium 4.3 3.5 - 5.1 mmol/L   Chloride 103 101 - 111 mmol/L   CO2 25 22 - 32 mmol/L   Glucose, Bld 93 65 - 99 mg/dL   BUN 8 6 - 20 mg/dL   Creatinine, Ser 1.09 0.61 - 1.24 mg/dL   Calcium 8.8 (L) 8.9 -  10.3 mg/dL   GFR calc non Af Amer >60 >60 mL/min   GFR calc Af Amer >60 >60 mL/min   Anion gap 10 5 - 15  CBC     Status: Abnormal   Collection Time: 06/24/16  3:48 AM  Result Value Ref Range   WBC 7.6 4.0 - 10.5 K/uL   RBC 4.23 4.22 - 5.81 MIL/uL   Hemoglobin 15.2 13.0 - 17.0 g/dL   HCT 41.3 39.0 - 52.0 %   MCV 97.6 78.0 - 100.0 fL   MCH 35.9 (H) 26.0 - 34.0 pg   MCHC 36.8 (H) 30.0 - 36.0 g/dL   RDW 14.4 11.5 - 15.5 %   Platelets 133 (L) 150 - 400 K/uL  Protime-INR     Status: Abnormal   Collection Time: 06/24/16  3:48 AM  Result Value Ref Range   Prothrombin Time 32.1 (H) 11.4 - 15.2 seconds   INR 3.03     Studies/Results: Dg Chest 2 View  Result Date: 06/23/2016 CLINICAL DATA:  Acute onset of shortness of breath. Atrial fibrillation. Initial encounter. EXAM: CHEST  2 VIEW COMPARISON:  Chest radiograph performed 11/17/2015 FINDINGS: The lungs are well-aerated. Left basilar scarring is noted. Mild peribronchial thickening is seen. No pleural effusion or pneumothorax is identified. The heart is borderline normal in size. No acute osseous abnormalities are seen. IMPRESSION: Left basilar scarring noted.  Mild peribronchial thickening seen. Electronically Signed   By: Garald Balding M.D.   On: 06/23/2016 22:50    Medications: {Reviewed     @PROBHOSP @  1  NSTEMI  Minmal elevation in trop  ? Due to CHF  Mild increased volume on exam The pt was seen by C McAlhany in May  Feeling great at time  I get the impression talking to him that he has been watching what he does more closely over the past couplle months  Has been more SOB  No CP   IKnown CAD  And systolic CHF   I would recomm at the least a lexiscan myovue  INR is over 3   Plan for tomorrow   Echo pending    2  Acute on chronic systolic CHF  WIll give lasix IV x 1  Strict I/O  He is talking lasix 40 alternating with 20 qd at home  ? Adequate  Willl make 40 qd    3  HTN  BP lable    4  Persistent atiral fib  HR on slow side   Follow  Holding coumadin for now .  Stop dilt  Follow HR    LOS: 0 days   Dorris Carnes 06/24/2016, 6:52 AM

## 2016-06-24 NOTE — Progress Notes (Signed)
Troponin 0.06, was 0.05, no s/s, will continue to monitor, Thanks Arvella Nigh RN

## 2016-06-24 NOTE — H&P (Signed)
History and Physical  Primary Cardiologist: Angelena Form PCP: Eulas Post, MD  Chief Complaint: shortness of breath, lower extremity edema  HPI:   82 yo male with history of CAD s/p DES to mid LAD 2/201 -- 2.75 x 18 mm Xience, chronic systolic CHF EF ~24% with mild RV systolic dysfunction, HLD, HTN, persistent AF on coumadin.  He presents after an acute onset/episode of shortness of breath.  No real angina with it; he had just come in from outside and was watching TV when it occurred.  Associated with dizziness/lightheadedness.  Past week, no change in stable symptoms of DOE after moderate exertion.  At baseline able to walk his dog some without any change in this.  His symptoms today reminded him of what proceeded his PCI in 2016, prompting presentation.  Troponin I elevated in ED.  325 ASA given.    Gradually his shortness of breath resolved over time.  No NTG given.    No recent issues with medications, no change in lasix dosing.  To his knowledge, no real changes with edema. No excessive bleeding/bruising above nuisance bleeds from bumps with coumadin. No N/V, orthopnea.  He has been off antiplatelets given coumadin.  Is followed closely by Dr. Angelena Form.    Prior Studies TTE 12/13/15 Impressions: - The patient was in atrial fibrillation. EF 40%, diffuse   hypokinesis. Normal RV size with mildly decreased systolic   function. No significant valvular abnormalities. Moderate   biatrial enlargement.  TTE 01/13/2015 Study Conclusions - Left ventricle: The cavity size was normal. Wall thickness was   increased in a pattern of mild LVH. Systolic function was mildly   reduced. The estimated ejection fraction was in the range of 45%   to 50%. Images were inadequate for LV wall motion assessment. The   study is not technically sufficient to allow evaluation of LV   diastolic function. - Left atrium: Moderately dilated at 45 ml/m2. - Right atrium: Severely dilated at 30 cm2.  LHC  11/10/14 Final Conclusions:  1. Severe one-vessel coronary artery disease. 2. Moderately to severely reduced LV systolic function. 3. Mildly elevated left ventricular end-diastolic pressure. 4. Successful angioplasty and drug-eluting stent placement to the mid LAD (2.75 x 18 mm Xience  drug-eluting stent)  Past Medical History:  Diagnosis Date  . Allergy    hay fever, allergies  . CAD (coronary artery disease)    DES to mid LAD 11/10/2014  . Chronic systolic heart failure ([place])   . History of echocardiogram    Echo 3/17: EF 40%, diff HK, trivial AI, trivial MR, mod LAE, mild reduced RVSF, mod RAE  . Hyperlipidemia   . Hypertension   . Persistent atrial fibrillation [facility])     Past Surgical History:  Procedure Laterality Date  . APPENDECTOMY  1947  . CARDIOVERSION N/A 12/30/2014   Procedure: CARDIOVERSION;  Surgeon: Jerline Pain, MD;  Location: [facility];  Service: Cardiovascular;  Laterality: N/A;  . LEFT HEART CATHETERIZATION WITH CORONARY ANGIOGRAM N/A 11/10/2014   Procedure: LEFT HEART CATHETERIZATION WITH CORONARY ANGIOGRAM;  Surgeon: Wellington Hampshire, MD;  Location: [place] CATH LAB;  Service: Cardiovascular;  Laterality: N/A;  . PERCUTANEOUS CORONARY STENT INTERVENTION (PCI-S)  11/10/2014   Procedure: PERCUTANEOUS CORONARY STENT INTERVENTION (PCI-S);  Surgeon: Wellington Hampshire, MD;  Location: [facility] CATH LAB;  Service: Cardiovascular;;    Family History  Problem Relation Age of Onset  . Cancer Mother     breast  . Cancer Father     colon  Social History:  reports that he has never smoked. He has never used smokeless tobacco. He reports that he does not drink alcohol or use drugs.  Allergies: No Known Allergies  No current facility-administered medications on file prior to encounter.    Current Outpatient Prescriptions on File Prior to Encounter  Medication Sig Dispense Refill  . acetaminophen (TYLENOL) 500 MG tablet Take 500 mg by mouth every 4 (four) hours as needed for  fever (pain).    . Ascorbic Acid (VITAMIN C) 1000 MG tablet Take 1,000 mg by mouth daily.    Marland Kitchen atorvastatin (LIPITOR) 40 MG tablet TAKE ONE TABLET BY MOUTH DAILY AT 6PM. 30 tablet 11  . diltiazem (CARDIZEM CD) 120 MG 24 hr capsule Take 1 capsule (120 mg total) by mouth daily. 30 capsule 6  . furosemide (LASIX) 40 MG tablet Take one tablet (40 mg) once every other day alternating with 1/2 tablet (20 mg) once every other day    . gabapentin (NEURONTIN) 300 MG capsule TAKE ONE CAPSULE BY MOUTH UP TO THREE TIMES DAILY AS NEEDED FOR PAIN 180 capsule 1  . lisinopril (PRINIVIL,ZESTRIL) 5 MG tablet TAKE ONE TABLET BY MOUTH ONCE DAILY 30 tablet 11  . metoprolol (LOPRESSOR) 100 MG tablet Take 1 tablet (100 mg total) by mouth 2 (two) times daily. 180 tablet 3  . potassium chloride (K-DUR) 10 MEQ tablet Take 1 tablet (10 mEq total) by mouth daily. 30 tablet 6    Results for orders placed or performed during the hospital encounter of 06/23/16 (from the past 48 hour(s))  Basic metabolic panel     Status: Abnormal   Collection Time: 06/23/16 10:33 PM  Result Value Ref Range   Sodium 138 135 - 145 mmol/L   Potassium 3.9 3.5 - 5.1 mmol/L   Chloride 103 101 - 111 mmol/L   CO2 25 22 - 32 mmol/L   Glucose, Bld 98 65 - 99 mg/dL   BUN 8 6 - 20 mg/dL   Creatinine, Ser 1.13 0.61 - 1.24 mg/dL   Calcium 9.0 8.9 - 10.3 mg/dL   GFR calc non Af Amer 58 (L) >60 mL/min   GFR calc Af Amer >60 >60 mL/min    Comment: (NOTE) The eGFR has been calculated using the CKD EPI equation. This calculation has not been validated in all clinical situations. eGFR's persistently <60 mL/min signify possible Chronic Kidney Disease.    Anion gap 10 5 - 15  CBC     Status: Abnormal   Collection Time: 06/23/16 10:33 PM  Result Value Ref Range   WBC 7.4 4.0 - 10.5 K/uL   RBC 4.87 4.22 - 5.81 MIL/uL   Hemoglobin 15.8 13.0 - 17.0 g/dL   HCT 46.1 39.0 - 52.0 %   MCV 94.7 78.0 - 100.0 fL   MCH 32.4 26.0 - 34.0 pg   MCHC 34.3 30.0 -  36.0 g/dL   RDW 14.3 11.5 - 15.5 %   Platelets 146 (L) 150 - 400 K/uL  Troponin I     Status: Abnormal   Collection Time: 06/23/16 10:33 PM  Result Value Ref Range   Troponin I 0.05 (HH) <0.03 ng/mL    Comment: CRITICAL RESULT CALLED TO, READ BACK BY AND VERIFIED WITH: OLSEN,E RN 06/23/2016 2319 JORDANS   Brain natriuretic peptide     Status: Abnormal   Collection Time: 06/23/16 10:33 PM  Result Value Ref Range   B Natriuretic Peptide 505.9 (H) 0.0 - 100.0 pg/mL  Protime-INR  Status: Abnormal   Collection Time: 06/23/16 10:33 PM  Result Value Ref Range   Prothrombin Time 31.0 (H) 11.4 - 15.2 seconds   INR 2.91    Dg Chest 2 View  Result Date: 06/23/2016 CLINICAL DATA:  Acute onset of shortness of breath. Atrial fibrillation. Initial encounter. EXAM: CHEST  2 VIEW COMPARISON:  Chest radiograph performed 11/17/2015 FINDINGS: The lungs are well-aerated. Left basilar scarring is noted. Mild peribronchial thickening is seen. No pleural effusion or pneumothorax is identified. The heart is borderline normal in size. No acute osseous abnormalities are seen. IMPRESSION: Left basilar scarring noted.  Mild peribronchial thickening seen. Electronically Signed   By: Garald Balding M.D.   On: 06/23/2016 22:50    ECG/Tele: AF with old anterior MI and non-specific lateral changes  ROS: As above. Otherwise, review of systems is negative unless per above HPI  Vitals:   06/24/16 0030 06/24/16 0045 06/24/16 0100 06/24/16 0115  BP: 134/76 138/80 167/93 149/84  Pulse: (!) 44 72 (!) 55 (!) 56  Resp: _0 SpO2: 98% 97% 97% 96%   Wt Readings from Last 10 Encounters:  03/02/16 86.2 kg (190 lb)  02/06/16 85.2 kg (187 lb 12.8 oz)  12/26/15 86.5 kg (190 lb 11.2 oz)  11/29/15 85.1 kg (187 lb 11.2 oz)  11/25/15 84.4 kg (186 lb)  11/19/15 82.8 kg (182 lb 9.6 oz)  11/09/15 89.8 kg (198 lb)  11/07/15 89.3 kg (196 lb 12.8 oz)  10/31/15 87.5 kg (193 lb)  10/20/15 87.2 kg (192 lb 3.2 oz)     PE:  General: No acute distress HEENT: Atraumatic, EOMI, mucous membranes moist CV: irr irr, mild SEM 1/6 RUSB, no gallops. JVD estimated at 10 cmH20 at 45 degrees.  Respiratory: Clear, no crackles. Normal work of breathing ABD: Non-distended and non-tender. No palpable organomegaly.  Extremities: 2+ radial pulses bilaterally. 2+ edema localized to ankles Neuro/Psych: CN grossly intact, alert and oriented  Assessment/Plan NSTEMI Chronic ischemic cardiomyopathy, possible mild component of acute exacerbation HTN HLD Persistent atrial fibrillation   Assessment: Unclear etiology of symptoms, but patient greatly reminded of previous MI and needed PCI, so elevated troponin concerning for anginal equivalent.  No rapid Afib or other obvious etiology of rapid afib.  DVT PE low risk given chronic AC.  Will empirically treat as type I NSTEMI  NSTEMI - Hold coumadin, likely start heparin drip tomorrow after reviewing INR - S/P 325 ASA in ED, continue 81 mg ASA - troponin trend - Home atorvastatin - Limited TTE  Chronic ischemic cardiomyopathy EF ~35%, possible mild component of acute exacerbation - Repeat Limited TTE for wall motion/function and IVC size - Lasix 40 mg PO qd - Continue metoprolol  - Previously went back into rapid afib off of diltiazem, will continue for now   HTN - As above  Persistent atrial fibrillation  - As above, cont metop and dilt and hold coumadin  Full code confirmed  Lolita Cram Dorri Ozturk  MD 06/24/2016, 2:03 AM

## 2016-06-25 ENCOUNTER — Inpatient Hospital Stay (HOSPITAL_COMMUNITY): Payer: Medicare Other

## 2016-06-25 DIAGNOSIS — R0602 Shortness of breath: Secondary | ICD-10-CM

## 2016-06-25 DIAGNOSIS — I481 Persistent atrial fibrillation: Secondary | ICD-10-CM

## 2016-06-25 DIAGNOSIS — I2510 Atherosclerotic heart disease of native coronary artery without angina pectoris: Secondary | ICD-10-CM

## 2016-06-25 DIAGNOSIS — I255 Ischemic cardiomyopathy: Secondary | ICD-10-CM

## 2016-06-25 DIAGNOSIS — I5022 Chronic systolic (congestive) heart failure: Secondary | ICD-10-CM

## 2016-06-25 DIAGNOSIS — I10 Essential (primary) hypertension: Secondary | ICD-10-CM

## 2016-06-25 LAB — NM MYOCAR MULTI W/SPECT W/WALL MOTION / EF
CHL CUP MPHR: 136 {beats}/min
CSEPHR: 81 %
Estimated workload: 1 METS
Exercise duration (min): 5 min
Peak HR: 111 {beats}/min
Rest HR: 92 {beats}/min

## 2016-06-25 LAB — PROTIME-INR
INR: 2.38
PROTHROMBIN TIME: 26.4 s — AB (ref 11.4–15.2)

## 2016-06-25 MED ORDER — REGADENOSON 0.4 MG/5ML IV SOLN
0.4000 mg | Freq: Once | INTRAVENOUS | Status: AC
  Administered 2016-06-25: 0.4 mg via INTRAVENOUS
  Filled 2016-06-25: qty 5

## 2016-06-25 MED ORDER — NITROGLYCERIN 0.4 MG SL SUBL: 0.4000 mg | SUBLINGUAL_TABLET | SUBLINGUAL | 2 refills | Status: DC | PRN

## 2016-06-25 MED ORDER — TECHNETIUM TC 99M TETROFOSMIN IV KIT
30.0000 | PACK | Freq: Once | INTRAVENOUS | Status: AC | PRN
  Administered 2016-06-25: 30 via INTRAVENOUS

## 2016-06-25 MED ORDER — REGADENOSON 0.4 MG/5ML IV SOLN
INTRAVENOUS | Status: AC
  Filled 2016-06-25: qty 5

## 2016-06-25 MED ORDER — TECHNETIUM TC 99M TETROFOSMIN IV KIT
10.0000 | PACK | Freq: Once | INTRAVENOUS | Status: AC | PRN
  Administered 2016-06-25: 10 via INTRAVENOUS

## 2016-06-25 MED ORDER — FUROSEMIDE 40 MG PO TABS: 40.0000 mg | ORAL_TABLET | Freq: Every day | ORAL | 12 refills | Status: DC

## 2016-06-25 MED ORDER — ASPIRIN 81 MG PO TBEC: 81.0000 mg | DELAYED_RELEASE_TABLET | Freq: Every day | ORAL | Status: DC

## 2016-06-25 NOTE — Progress Notes (Signed)
Pt has orders to be discharged. Discharge instructions given and pt has no additional questions at this time. Medication regimen reviewed and pt educated. Pt verbalized understanding and has no additional questions. Telemetry box removed. IV removed and site in good condition. Pt stable and waiting for transportation. 

## 2016-06-25 NOTE — Progress Notes (Signed)
Patient Profile: 82 yo male with history of CAD s/p DES to mid LAD 2/201 -- 2.75 x 18 mm Xience, chronic systolic CHF EF [id number] with mild RV systolic dysfunction, HLD, HTN, persistent AF on coumadin.  He presents after an acute onset/episode of shortness of breath. Troponin's minimally elevated x 3, with flat trend.   Subjective: He denies CP. No current dyspnea. Tolerated NST well. Results pending   Objective: Vital signs in last 24 hours: Temp:  [97.7 F (36.5 C)-98.3 F (36.8 C)] 98.3 F (36.8 C) (10/09 0527) Pulse Rate:  [62-80] 80 (10/09 0946) Resp:  [18] 18 (10/09 0527) BP: (125-154)/(57-85) 154/85 (10/09 0946) SpO2:  [97 %-99 %] 97 % (10/09 0527) Weight:  [183 lb 14.4 oz (83.4 kg)] 183 lb 14.4 oz (83.4 kg) (10/09 0527) Last BM Date: 06/24/16  Intake/Output from previous day: 10/08 0701 - 10/09 0700 In: 480 [P.O.:480] Out: -  Intake/Output this shift: No intake/output data recorded.  Medications Current Facility-Administered Medications  Medication Dose Route Frequency Provider Last Rate Last Dose  . regadenoson (LEXISCAN) 0.4 MG/5ML injection SOLN           . acetaminophen (TYLENOL) tablet 500 mg  500 mg Oral Q4H PRN Lolita Cram Means, MD      . aspirin EC tablet 81 mg  81 mg Oral Daily Lolita Cram Means, MD      . atorvastatin (LIPITOR) tablet 40 mg  40 mg Oral q1800 Lolita Cram Means, MD   40 mg at 06/24/16 1747  . furosemide (LASIX) tablet 40 mg  40 mg Oral Daily Fay Records, MD      . gabapentin (NEURONTIN) capsule 300 mg  300 mg Oral TID PRN Lolita Cram Means, MD      . lisinopril (PRINIVIL,ZESTRIL) tablet 5 mg  5 mg Oral Daily Lolita Cram Means, MD   5 mg at 06/24/16 1051  . metoprolol tartrate (LOPRESSOR) tablet 100 mg  100 mg Oral BID Lolita Cram Means, MD   100 mg at 06/25/16 0009  . nitroGLYCERIN (NITROSTAT) SL tablet 0.4 mg  0.4 mg Sublingual Q5 Min x 3 PRN Lolita Cram Means, MD      . ondansetron [facility]) injection 4 mg  4 mg  Intravenous Q6H PRN Lolita Cram Means, MD      . vitamin C (ASCORBIC ACID) tablet 1,000 mg  1,000 mg Oral Daily Lolita Cram Means, MD   1,000 mg at 06/24/16 1051    PE: General appearance: alert, cooperative and no distress Neck: no carotid bruit and no JVD Lungs: clear to auscultation bilaterally Heart: irregularly irregular rhythm Extremities: no LEE Pulses: 2+ and symmetric Skin: warm and dry Neurologic: Grossly normal  Lab Results:   Recent Labs  06/23/16 2233 06/24/16 0348  WBC 7.4 7.6  HGB 15.8 15.2  HCT 46.1 41.3  PLT 146* 133*   BMET  Recent Labs  06/23/16 2233 06/24/16 0348  NA 138 138  K 3.9 4.3  CL 103 103  CO2 25 25  GLUCOSE 98 93  BUN 8 8  CREATININE 1.13 1.09  CALCIUM 9.0 8.8*   PT/INR  Recent Labs  06/23/16 2233 06/24/16 0348 06/25/16 0446  LABPROT 31.0* 32.1* 26.4*  INR 2.91 3.03 2.38   Cardiac Panel (last 3 results)  Recent Labs  06/24/16 0348 06/24/16 0928 06/24/16 1508  TROPONINI 0.06* 0.05* 0.06*     Assessment/Plan  Principal Problem:   NSTEMI (non-ST elevated myocardial infarction) [facility]) Active Problems:   Hypertension  Hyperlipidemia   CAD (coronary artery disease)   Ischemic cardiomyopathy   Chronic systolic heart failure ([facility])   Persistent atrial fibrillation ([place])   1. CAD: history of CAD s/p DES to mid LAD 2/201 -- 2.75 x 18 mm Xience. Currently CP free. No dyspnea. Troponin with flat low level trend. ? Demand ischemia. NST completed. Results pending. If abnormal, will plan for definitive LHC. Continue ASA, Lipitor, metoprolol and lisinopril.   2. Elevated Troponin: flat low level trend, 0.06>>0.05>>0.06. ? Demand ischemia. He has underlying CAD. NST pending to assess for ischemia.   3. Chronic Ischemic Cardiomyopathy: EF ~35%. possible mild component of acute exacerbation. Repeat 2D echo pending. Continue lasix and metoprolol.   4. Persistent Atrial Fibrillation: VR controlled. Dilt stopped given low  HR. Currently on metoprolol. Coumadin on hold, in the event that he may need a LHC if abnormal NST. INR is 2.38 today.   5. HTN: BP moderately elevated. AM meds were held for stress test. Resume scheduled meds when patient returns to floor. Monitor.     LOS: 1 day    Brittainy M. Rosita Fire, PA-C 06/25/2016 10:27 AM   I have seen, examined and evaluated the patient this PM after the [place] along with Ms. Rosita Fire, [place].  After reviewing all the available data and chart, we discussed the patients laboratory, study & physical findings as well as symptoms in detail. I agree with her findings, examination as well as impression recommendations as per our discussion.     Myoview read as Intermediate Risk 2/2 EF 40% with prior inferior Infarct - NO ISCHEMIA. Suspect that mild troponin bump is related to CHF /AFib on presentation.   NO further evaluation needed.  Echo not yet done (wbut EF on Nuc correlates with recent Echo) - do not need to wait. OK for d/c home = agree with Lasix 40mg  daily *instead of 40-20 alternating.  Also discussed Sliding Scale Lasix for wgt increase > 3 Lb, edema / orthopnea    Glenetta Hew, M.D., M.S. Interventional Cardiologist   Pager # [phone] Phone # [phone] [address] [place], [place] [address]

## 2016-06-25 NOTE — Discharge Summary (Signed)
Discharge Summary    Patient ID: Ricardo Valencia,  MRN: [id number], DOB/AGE: [birth year] 82 y.o.  Admit date: 06/23/2016 Discharge date: 06/25/2016  Primary Care Provider: Eulas Post Primary Cardiologist: Dr. Angelena Form  Discharge Diagnoses    Principal Problem:   NSTEMI (non-ST elevated myocardial infarction) [facility]) Active Problems:   Hypertension   Hyperlipidemia   CAD (coronary artery disease)   Ischemic cardiomyopathy   Chronic systolic heart failure (HCC)   Persistent atrial fibrillation (HCC)   Allergies No Known Allergies  Diagnostic Studies/Procedures    EXAM: MYOCARDIAL IMAGING WITH SPECT (REST AND PHARMACOLOGIC-STRESS)  GATED LEFT VENTRICULAR WALL MOTION STUDY  LEFT VENTRICULAR EJECTION FRACTION  TECHNIQUE: Standard myocardial SPECT imaging was performed after resting intravenous injection of 10 mCi Tc-64m tetrofosmin. Subsequently, intravenous infusion of Lexiscan was performed under the supervision of the Cardiology staff. At peak effect of the drug, 30 mCi Tc-29m tetrofosmin was injected intravenously and standard myocardial SPECT imaging was performed. Quantitative gated imaging was also performed to evaluate left ventricular wall motion, and estimate left ventricular ejection fraction.  COMPARISON:  Chest radiograph from 06/23/2016  FINDINGS: Perfusion: Matched reduction in activity in the inferior wall on stress and rest images likely from diaphragmatic attenuation, less likely from old scar.  Wall Motion: Septal dyskinesis with mild to moderate hypokinesis in the anterior and posterior walls. Poor wall thickening especially in the septal wall.  Left Ventricular Ejection Fraction: 40 %  End diastolic volume 90 ml  End systolic volume 54 ml  IMPRESSION: 1. Diaphragmatic attenuation versus scarring in the inferior wall.  2. Septal dyskinesis with mild to moderate hypokinesis in the anterior and posterior walls. Poor  wall thickening especially in the septal wall.  3. Left ventricular ejection fraction 40%  4. Non invasive risk stratification*: Intermediate  *2012 Appropriate Use Criteria for Coronary Revascularization Focused Update: J Am Coll Cardiol. N6492421. http://content.airportbarriers.com.aspx?articleid=1201161   _____________   History of Present Illness   Mr. Ricardo Valencia is a 82 yo male with history of CAD s/p DES to mid LAD 11/10/14 -- 2.75 x 18 mm Xience, chronic systolic CHF EF 123456 with mild RV systolic dysfunction, HLD, HTN, persistent AF on coumadin.  He presented to the ED on 06/23/16 after an acute onset/episode of shortness of breath.  No real angina with it; he had just come in from outside and was watching TV when it occurred.  Associated with dizziness/lightheadedness.  Over the past week, no change in stable symptoms of DOE after moderate exertion.  At baseline able to walk his dog some without any change in this.  His symptoms today reminded him of what proceeded his PCI in 2016, prompting presentation.  Gradually his shortness of breath resolved over time.  No NTG given.    No recent issues with medications, no change in lasix dosing.  To his knowledge, no real changes with edema. No excessive bleeding/bruising above nuisance bleeds from bumps with coumadin. No N/V, orthopnea.  He has been off antiplatelets given coumadin.  Is followed closely by Dr. Angelena Form.    Hospital Course  He underwent Lexiscan Myoview on 06/25/16. There was no ischemia noted. It resulted as intermediate risk for his EF of 40%, which is stable from last EF.   His Lasix was increased to 40mg  daily, previously on 40mg /20mg  daily alternating.   He will remain on diltiazem and beta blocker. No other changes in his meds.   He was seen today by Dr. Ellyn Hack and deemed suitable for discharge.  _____________  Discharge Vitals Blood pressure 130/74, pulse 71, temperature 97.8 F (36.6 C),  temperature source Oral, resp. rate 18, height 5\' 9"  (1.753 m), weight 183 lb 14.4 oz (83.4 kg), SpO2 100 %.  Filed Weights   06/24/16 0339 06/25/16 0527  Weight: 190 lb 12.8 oz (86.5 kg) 183 lb 14.4 oz (83.4 kg)    Labs & Radiologic Studies     CBC  Recent Labs  06/23/16 2233 06/24/16 0348  WBC 7.4 7.6  HGB 15.8 15.2  HCT 46.1 41.3  MCV 94.7 97.6  PLT 146* Q000111Q*   Basic Metabolic Panel  Recent Labs  06/23/16 2233 06/24/16 0348  NA 138 138  K 3.9 4.3  CL 103 103  CO2 25 25  GLUCOSE 98 93  BUN 8 8  CREATININE 1.13 1.09  CALCIUM 9.0 8.8*   Cardiac Enzymes  Recent Labs  06/24/16 0348 06/24/16 0928 06/24/16 1508  TROPONINI 0.06* 0.05* 0.06*    Dg Chest 2 View  Result Date: 06/23/2016 CLINICAL DATA:  Acute onset of shortness of breath. Atrial fibrillation. Initial encounter. EXAM: CHEST  2 VIEW COMPARISON:  Chest radiograph performed 11/17/2015 FINDINGS: The lungs are well-aerated. Left basilar scarring is noted. Mild peribronchial thickening is seen. No pleural effusion or pneumothorax is identified. The heart is borderline normal in size. No acute osseous abnormalities are seen. IMPRESSION: Left basilar scarring noted.  Mild peribronchial thickening seen. Electronically Signed   By: Garald Balding M.D.   On: 06/23/2016 22:50   Nm Myocar Multi W/spect W/wall Motion / Ef  Result Date: 06/25/2016 CLINICAL DATA:  Atrial fibrillation. Coronary catheterization with angioplasty and stent on 11/10/2014. Hypertension and shortness of breath. Coronary artery disease and congestive heart failure. EXAM: MYOCARDIAL IMAGING WITH SPECT (REST AND PHARMACOLOGIC-STRESS) GATED LEFT VENTRICULAR WALL MOTION STUDY LEFT VENTRICULAR EJECTION FRACTION TECHNIQUE: Standard myocardial SPECT imaging was performed after resting intravenous injection of 10 mCi Tc-88m tetrofosmin. Subsequently, intravenous infusion of Lexiscan was performed under the supervision of the Cardiology staff. At peak  effect of the drug, 30 mCi Tc-10m tetrofosmin was injected intravenously and standard myocardial SPECT imaging was performed. Quantitative gated imaging was also performed to evaluate left ventricular wall motion, and estimate left ventricular ejection fraction. COMPARISON:  Chest radiograph from 06/23/2016 FINDINGS: Perfusion: Matched reduction in activity in the inferior wall on stress and rest images likely from diaphragmatic attenuation, less likely from old scar. Wall Motion: Septal dyskinesis with mild to moderate hypokinesis in the anterior and posterior walls. Poor wall thickening especially in the septal wall. Left Ventricular Ejection Fraction: 40 % End diastolic volume 90 ml End systolic volume 54 ml IMPRESSION: 1. Diaphragmatic attenuation versus scarring in the inferior wall. 2. Septal dyskinesis with mild to moderate hypokinesis in the anterior and posterior walls. Poor wall thickening especially in the septal wall. 3. Left ventricular ejection fraction 40% 4. Non invasive risk stratification*: Intermediate *2012 Appropriate Use Criteria for Coronary Revascularization Focused Update: J Am Coll Cardiol. N6492421. http://content.airportbarriers.com.aspx?articleid=[email or web address] Electronically Signed   By: Van Clines M.D.   On: 06/25/2016 12:37    Disposition   Pt is being discharged home today in good condition.  Follow-up Plans & Appointments    Follow-up Information    Lauree Chandler, MD. Go on 07/10/2016.   Specialty:  Cardiology Why:  @8 :00am Contact information: [place]. [address] [place] [address] [phone]          Discharge Instructions    Diet - low sodium heart healthy  Complete by:  As directed    Increase activity slowly    Complete by:  As directed       Discharge Medications   Discharge Medication List as of 06/25/2016  2:44 PM    START taking these medications   Details  aspirin EC 81 MG EC tablet Take 1 tablet (81  mg total) by mouth daily., Starting Tue 06/26/2016, OTC    nitroGLYCERIN (NITROSTAT) 0.4 MG SL tablet Place 1 tablet (0.4 mg total) under the tongue every 5 (five) minutes x 3 doses as needed for chest pain., Starting Mon 06/25/2016, Normal      CONTINUE these medications which have CHANGED   Details  furosemide (LASIX) 40 MG tablet Take 1 tablet (40 mg total) by mouth daily., Starting Tue 06/26/2016, Normal      CONTINUE these medications which have NOT CHANGED   Details  acetaminophen (TYLENOL) 500 MG tablet Take 500 mg by mouth every 4 (four) hours as needed for fever (pain)., Historical Med    Ascorbic Acid (VITAMIN C) 1000 MG tablet Take 1,000 mg by mouth daily., Historical Med    atorvastatin (LIPITOR) 40 MG tablet TAKE ONE TABLET BY MOUTH DAILY AT 6PM., Normal    diltiazem (CARDIZEM CD) 120 MG 24 hr capsule Take 1 capsule (120 mg total) by mouth daily., Starting Sat 11/19/2015, Normal    gabapentin (NEURONTIN) 300 MG capsule TAKE ONE CAPSULE BY MOUTH UP TO THREE TIMES DAILY AS NEEDED FOR PAIN, Normal    lisinopril (PRINIVIL,ZESTRIL) 5 MG tablet TAKE ONE TABLET BY MOUTH ONCE DAILY, Normal    metoprolol (LOPRESSOR) 100 MG tablet Take 1 tablet (100 mg total) by mouth 2 (two) times daily., Starting Mon 10/31/2015, Normal    potassium chloride (K-DUR) 10 MEQ tablet Take 1 tablet (10 mEq total) by mouth daily., Starting Sat 11/19/2015, Normal           Outstanding Labs/Studies    Duration of Discharge Encounter   Greater than 30 minutes including physician time.  Signed, Arbutus Leas NP 06/25/2016, 3:55 PM   Patient was seen and evaluated prior to discharge. He is a mild exacerbation of his systolic and diastolic heart failure. Was treated with Lasix, feels much better. He had mild flat showing elevation which is likely consider be related to failure exacerbation and not non-STEMI. He was evaluated with Myoview stress test was showing any evidence of ischemia. It did show old  inferior infarct EF. Relatively normal. Therefore is stable for discharge home to follow-up as scheduled.   Glenetta Hew, M.D., M.S. Interventional Cardiologist   Pager # [phone] Phone # [phone] [address] [place], [place] [address]

## 2016-06-26 ENCOUNTER — Other Ambulatory Visit: Payer: Self-pay | Admitting: Physician Assistant

## 2016-06-27 ENCOUNTER — Encounter: Payer: Self-pay | Admitting: Physician Assistant

## 2016-07-04 ENCOUNTER — Other Ambulatory Visit: Payer: Self-pay | Admitting: Physician Assistant

## 2016-07-05 ENCOUNTER — Other Ambulatory Visit: Payer: Self-pay | Admitting: *Deleted

## 2016-07-05 MED ORDER — DILTIAZEM HCL ER COATED BEADS 120 MG PO CP24: 120.0000 mg | ORAL_CAPSULE | Freq: Every day | ORAL | 6 refills | Status: DC

## 2016-07-09 ENCOUNTER — Other Ambulatory Visit: Payer: Self-pay | Admitting: Family Medicine

## 2016-07-10 ENCOUNTER — Ambulatory Visit: Payer: Medicare Other | Admitting: Physician Assistant

## 2016-07-13 ENCOUNTER — Ambulatory Visit (INDEPENDENT_AMBULATORY_CARE_PROVIDER_SITE_OTHER): Payer: Medicare Other | Admitting: Physician Assistant

## 2016-07-13 ENCOUNTER — Ambulatory Visit (INDEPENDENT_AMBULATORY_CARE_PROVIDER_SITE_OTHER): Payer: Medicare Other | Admitting: *Deleted

## 2016-07-13 ENCOUNTER — Encounter: Payer: Self-pay | Admitting: Physician Assistant

## 2016-07-13 VITALS — BP 120/68 | HR 69 | Ht 69.0 in | Wt 190.8 lb

## 2016-07-13 DIAGNOSIS — I481 Persistent atrial fibrillation: Secondary | ICD-10-CM | POA: Diagnosis not present

## 2016-07-13 DIAGNOSIS — I255 Ischemic cardiomyopathy: Secondary | ICD-10-CM

## 2016-07-13 DIAGNOSIS — I2510 Atherosclerotic heart disease of native coronary artery without angina pectoris: Secondary | ICD-10-CM | POA: Diagnosis not present

## 2016-07-13 DIAGNOSIS — I10 Essential (primary) hypertension: Secondary | ICD-10-CM | POA: Diagnosis not present

## 2016-07-13 DIAGNOSIS — I4819 Other persistent atrial fibrillation: Secondary | ICD-10-CM

## 2016-07-13 DIAGNOSIS — Z5181 Encounter for therapeutic drug level monitoring: Secondary | ICD-10-CM | POA: Diagnosis not present

## 2016-07-13 DIAGNOSIS — I4891 Unspecified atrial fibrillation: Secondary | ICD-10-CM

## 2016-07-13 LAB — POCT INR: INR: 2.4

## 2016-07-13 NOTE — Patient Instructions (Addendum)
Medication Instructions:  Your physician recommends that you continue on your current medications as directed. Please refer to the Current Medication list given to you today.    Labwork: None ordered  Testing/Procedures: None ordered  Follow-Up: Your physician recommends that you schedule a follow-up appointment in: SEE DR. MCALHANY AS PLANNED   Any Other Special Instructions Will Be Listed Below (If Applicable).     If you need a refill on your cardiac medications before your next appointment, please call your pharmacy.   

## 2016-07-13 NOTE — Progress Notes (Signed)
Cardiology Office Note    Date:  07/13/2016   ID:  Ricardo Valencia, DOB [birth year], MRN [id number]  PCP:  Eulas Post, MD  Cardiologist:  Dr. Julianne Handler  CC: post hosp f/u  History of Present Illness:  Ricardo Valencia is a 82 y.o. male with a history of HTN, HLD, persistent afib on coumadin, CAD s/p DES to mLDA (10/2014), ischemic CM (25-30%--> 45-50%) and mild dementia who presents to clinic for post hospital follow up.   He was admitted to [facility] in 10/2014 with A. fib with RVR. Echocardiogram obtained during the admission showed EF 25-30% with moderate MR. He underwent cardiac catheterization on 11/10/2014 which showed severe mid LAD stenosis treated with 2.75 x 16 mm Xience DES, moderate disease in RCA. He was initially placed on eliquis and Plavix, eliquis was later changed to Coumadin. He came back and underwent DCCV in 12/2014. Repeat echocardiogram in April showed EF has went back to 45-50%, no significant MR noted.  Patient admitted 1/23-1/27/17 for afib with RVR in the setting of shingles. He had some adjustments made in his medications post DC when his BP remained elevated.    He was readmitted Q000111Q with a/c systolic CHF in the setting of AF with RVR.  DC weight was 182 lbs.  He he was kept on Diltiazem for rate control despite his DCM.    2D ECHO 12/13/15 showed EF 40% with diffuse HK. Moderate biatrial enlargement.  Admitted 10/7-10/9/17 for an acute onset/episode of shortness of breath. He underwent Lexiscan Myoview on 06/25/16. There was no ischemia noted. It resulted as intermediate risk for his EF of 40%, which is stable from last EF. His Lasix was increased to 40mg  daily, previously on 40mg /20mg  daily alternating.   Today he presents to clinic for follow up. He has been doing well. No chest pain or SOB. No LE edema, orthopnea or PND. No dizziness or syncope. No blood in stool or urine. No palpitation. No fatigue or weakness.     Past Medical History:    Diagnosis Date  . Allergy    hay fever, allergies  . CAD (coronary artery disease)    DES to mid LAD 11/10/2014  . Chronic systolic heart failure ([place])   . History of echocardiogram    Echo 3/17: EF 40%, diff HK, trivial AI, trivial MR, mod LAE, mild reduced RVSF, mod RAE  . Hyperlipidemia   . Hypertension   . Persistent atrial fibrillation [facility])     Past Surgical History:  Procedure Laterality Date  . APPENDECTOMY  1947  . CARDIOVERSION N/A 12/30/2014   Procedure: CARDIOVERSION;  Surgeon: Jerline Pain, MD;  Location: [place];  Service: Cardiovascular;  Laterality: N/A;  . LEFT HEART CATHETERIZATION WITH CORONARY ANGIOGRAM N/A 11/10/2014   Procedure: LEFT HEART CATHETERIZATION WITH CORONARY ANGIOGRAM;  Surgeon: Wellington Hampshire, MD;  Location: [place] CATH LAB;  Service: Cardiovascular;  Laterality: N/A;  . PERCUTANEOUS CORONARY STENT INTERVENTION (PCI-S)  11/10/2014   Procedure: PERCUTANEOUS CORONARY STENT INTERVENTION (PCI-S);  Surgeon: Wellington Hampshire, MD;  Location[facility] CATH LAB;  Service: Cardiovascular;;    Current Medications: Outpatient Medications Prior to Visit  Medication Sig Dispense Refill  . acetaminophen (TYLENOL) 500 MG tablet Take 500 mg by mouth every 4 (four) hours as needed for fever (pain).    . Ascorbic Acid (VITAMIN C) 1000 MG tablet Take 1,000 mg by mouth daily.    Marland Kitchen aspirin EC 81 MG EC tablet Take 1 tablet (81 mg total)  by mouth daily.    Marland Kitchen atorvastatin (LIPITOR) 40 MG tablet TAKE ONE TABLET BY MOUTH DAILY AT 6PM. 30 tablet 11  . diltiazem (CARDIZEM CD) 120 MG 24 hr capsule Take 1 capsule (120 mg total) by mouth daily. 30 capsule 6  . furosemide (LASIX) 40 MG tablet Take 1 tablet (40 mg total) by mouth daily. 30 tablet 12  . gabapentin (NEURONTIN) 300 MG capsule TAKE ONE CAPSULE BY MOUTH UP TO THREE TIMES DAILY AS NEEDED FOR PAIN 90 capsule 2  . KLOR-CON M10 10 MEQ tablet TAKE ONE TABLET BY MOUTH ONCE DAILY 90 tablet 1  . lisinopril (PRINIVIL,ZESTRIL) 5 MG  tablet TAKE ONE TABLET BY MOUTH ONCE DAILY 30 tablet 11  . metoprolol (LOPRESSOR) 100 MG tablet Take 1 tablet (100 mg total) by mouth 2 (two) times daily. 180 tablet 3  . nitroGLYCERIN (NITROSTAT) 0.4 MG SL tablet Place 1 tablet (0.4 mg total) under the tongue every 5 (five) minutes x 3 doses as needed for chest pain. 25 tablet 2   No facility-administered medications prior to visit.      Allergies:   Review of patient's allergies indicates no known allergies.   Social History   Social History  . Marital status: Married    Spouse name: N/A  . Number of children: N/A  . Years of education: N/A   Social History Main Topics  . Smoking status: Never Smoker  . Smokeless tobacco: Never Used  . Alcohol use No  . Drug use: No  . Sexual activity: Not Asked   Other Topics Concern  . None   Social History Narrative  . None     Family History:  The patient's family history includes Cancer in his father and mother.     ROS:   Please see the history of present illness.    ROS All other systems reviewed and are negative.   PHYSICAL EXAM:   VS:  BP 120/68   Pulse 69   Ht 5\' 9"  (1.753 m)   Wt 190 lb 12.8 oz (86.5 kg)   BMI 28.18 kg/m    GEN: Well nourished, well developed, in no acute distress  HEENT: normal  Neck: no JVD, carotid bruits, or masses Cardiac: irreg irreg; no murmurs, rubs, or gallops,no edema  Respiratory:  clear to auscultation bilaterally, normal work of breathing GI: soft, nontender, nondistended, + BS MS: no deformity or atrophy  Skin: warm and dry, no rash Neuro:  Alert and Oriented x 3, Strength and sensation are intact Psych: euthymic mood, full affect  Wt Readings from Last 3 Encounters:  07/13/16 190 lb 12.8 oz (86.5 kg)  06/25/16 183 lb 14.4 oz (83.4 kg)  03/02/16 190 lb (86.2 kg)      Studies/Labs Reviewed:   EKG:  EKG is ordered today.  The ekg ordered today demonstrates atrial fibrillation HR 69  Recent Labs: 02/15/2016: ALT 28 06/23/2016:  B Natriuretic Peptide 505.9 06/24/2016: BUN 8; Creatinine, Ser 1.09; Hemoglobin 15.2; Platelets 133; Potassium 4.3; Sodium 138   Lipid Panel    Component Value Date/Time   CHOL 150 02/15/2016 1155   TRIG 98 02/15/2016 1155   HDL 66 02/15/2016 1155   CHOLHDL 2.3 02/15/2016 1155   VLDL 20 02/15/2016 1155   LDLCALC 64 02/15/2016 1155   LDLDIRECT 191.1 01/04/2011 1218    Additional studies/ records that were reviewed today include:  2D ECHO: 12/13/2015 LV EF: 40% Study Conclusions - Left ventricle: The cavity size was normal. Wall thickness  was   normal. Indeterminant diastolic function (atrial fibrillation).   The estimated ejection fraction was 40%. Diffuse hypokinesis. - Aortic valve: There was no stenosis. There was trivial   regurgitation. - Mitral valve: There was trivial regurgitation. - Left atrium: The atrium was moderately dilated. - Right ventricle: The cavity size was normal. Systolic function   was mildly reduced. - Right atrium: The atrium was moderately dilated. - Pulmonary arteries: No complete TR doppler jet so unable to   estimate PA systolic pressure. - Inferior vena cava: The vessel was normal in size. The   respirophasic diameter changes were in the normal range (>= 50%),   consistent with normal central venous pressure. Impressions: - The patient was in atrial fibrillation. EF 40%, diffuse   hypokinesis. Normal RV size with mildly decreased systolic   function. No significant valvular abnormalities. Moderate   biatrial enlargement.  Myoview 06/25/16 IMPRESSION: 1. Diaphragmatic attenuation versus scarring in the inferior wall. 2. Septal dyskinesis with mild to moderate hypokinesis in the anterior and posterior walls. Poor wall thickening especially in the septal wall. 3. Left ventricular ejection fraction 40% 4. Non invasive risk stratification*: Intermediate  ASSESSMENT & PLAN:    Persistent AFib: Rate is controlled. He is fairly asymptomatic. A  strategy of rate control seems to be appropriate for him. Continue Coumadin, current dose of Dilt and Metoprolol.  Chronic Systolic HF: Volume stable.  He is NYHA 2-2b.  We discussed the importance of daily weights, which have been stable at home.     Ischemic CM: appears euvolemic. Continue beta blocker, ACE inhibitor and lasix 40 mg daily.   CAD:  No angina.  Continue beta blocker, statin.  No ASA as he is on Coumadin.   HTN: BP well controlled    HLD: continue statin     Medication Adjustments/Labs and Tests Ordered: Current medicines are reviewed at length with the patient today.  Concerns regarding medicines are outlined above.  Medication changes, Labs and Tests ordered today are listed in the Patient Instructions below. Patient Instructions  Medication Instructions:  Your physician recommends that you continue on your current medications as directed. Please refer to the Current Medication list given to you today.   Labwork: None ordered  Testing/Procedures: None ordered  Follow-Up: Your physician recommends that you schedule a follow-up appointment in: SEE DR. MCALHANY AS PLANNED  Any Other Special Instructions Will Be Listed Below (If Applicable).    If you need a refill on your cardiac medications before your next appointment, please call your pharmacy.      Signed, Angelena Form, PA-C  07/13/2016 3:28 PM    [facility] [place], [place], [place]  [address] Phone: [phone]; Fax: [phone]

## 2016-08-23 ENCOUNTER — Other Ambulatory Visit: Payer: Self-pay

## 2016-08-27 ENCOUNTER — Ambulatory Visit (INDEPENDENT_AMBULATORY_CARE_PROVIDER_SITE_OTHER): Payer: Medicare Other | Admitting: Pharmacist

## 2016-08-27 DIAGNOSIS — I255 Ischemic cardiomyopathy: Secondary | ICD-10-CM | POA: Diagnosis not present

## 2016-08-27 DIAGNOSIS — I4891 Unspecified atrial fibrillation: Secondary | ICD-10-CM | POA: Diagnosis not present

## 2016-08-27 DIAGNOSIS — Z5181 Encounter for therapeutic drug level monitoring: Secondary | ICD-10-CM

## 2016-08-27 LAB — POCT INR: INR: 2.9

## 2016-09-02 ENCOUNTER — Other Ambulatory Visit: Payer: Self-pay | Admitting: Cardiovascular Disease

## 2016-10-02 ENCOUNTER — Other Ambulatory Visit: Payer: Self-pay | Admitting: Cardiovascular Disease

## 2016-10-12 ENCOUNTER — Ambulatory Visit (INDEPENDENT_AMBULATORY_CARE_PROVIDER_SITE_OTHER): Payer: Medicare Other | Admitting: *Deleted

## 2016-10-12 DIAGNOSIS — I4891 Unspecified atrial fibrillation: Secondary | ICD-10-CM

## 2016-10-12 DIAGNOSIS — Z5181 Encounter for therapeutic drug level monitoring: Secondary | ICD-10-CM | POA: Diagnosis not present

## 2016-10-12 LAB — POCT INR: INR: 1.8

## 2016-11-09 ENCOUNTER — Ambulatory Visit (INDEPENDENT_AMBULATORY_CARE_PROVIDER_SITE_OTHER): Payer: Medicare Other | Admitting: Pharmacist

## 2016-11-09 DIAGNOSIS — Z5181 Encounter for therapeutic drug level monitoring: Secondary | ICD-10-CM | POA: Diagnosis not present

## 2016-11-09 DIAGNOSIS — I4891 Unspecified atrial fibrillation: Secondary | ICD-10-CM

## 2016-11-09 LAB — POCT INR: INR: 2.8

## 2016-12-06 ENCOUNTER — Other Ambulatory Visit: Payer: Self-pay | Admitting: Cardiovascular Disease

## 2016-12-21 ENCOUNTER — Ambulatory Visit (INDEPENDENT_AMBULATORY_CARE_PROVIDER_SITE_OTHER): Payer: Medicare Other

## 2016-12-21 DIAGNOSIS — I4891 Unspecified atrial fibrillation: Secondary | ICD-10-CM

## 2016-12-21 DIAGNOSIS — Z5181 Encounter for therapeutic drug level monitoring: Secondary | ICD-10-CM | POA: Diagnosis not present

## 2016-12-21 LAB — POCT INR: INR: 2.5

## 2017-01-20 IMAGING — DX DG CHEST 1V PORT
1 series · 1 of 1 positions shown · non-contrast
Comparison: 11/07/2014.

CLINICAL DATA: Forty history of heart probable

EXAM:
PORTABLE CHEST 1 VIEW

[chest ap]
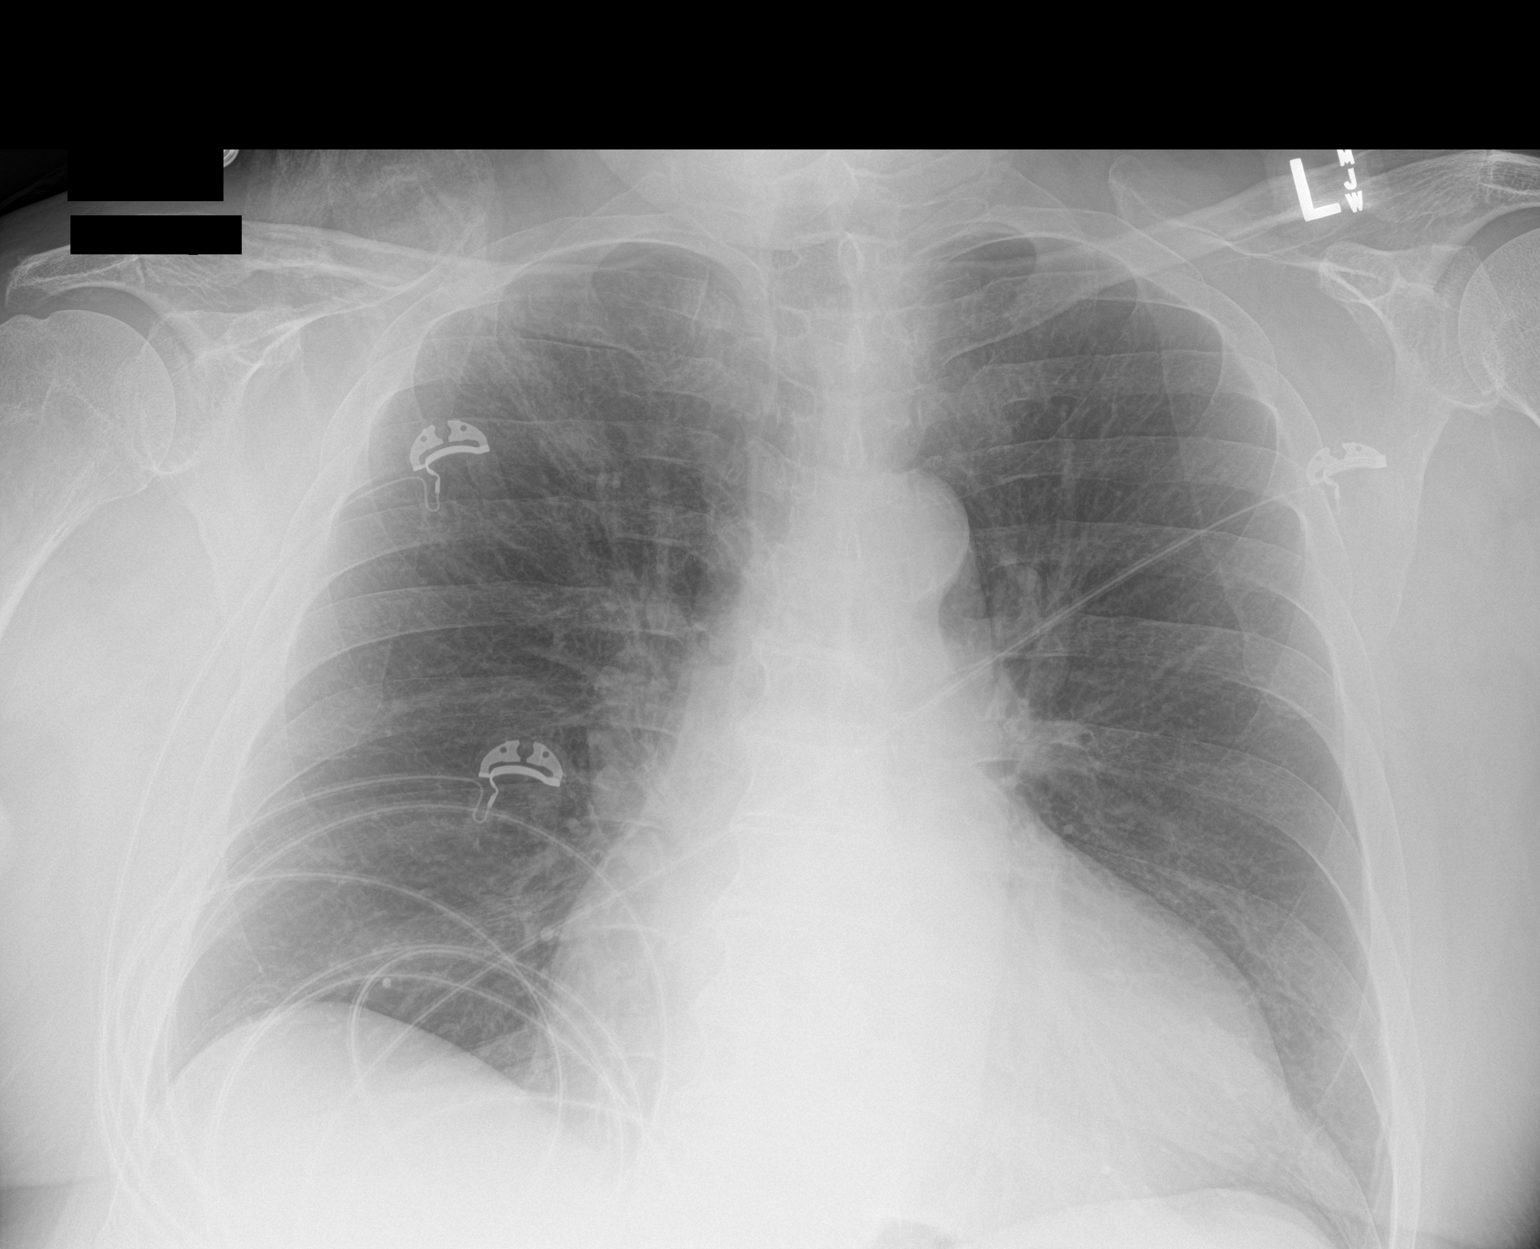

[1 of 1 positions shown; findings below may reference images not displayed]

FINDINGS: 7879 hours. No focal airspace consolidation or pulmonary edema. No
pleural effusion. Wedge-shaped opacity over the right upper lung is
probably related to clothing or material seen over the soft tissues
of the right shoulder. The cardio pericardial silhouette is
enlarged. The visualized bony structures of the thorax are intact.
Telemetry leads overlie the chest.
IMPRESSION: Wedge-shaped opacity over the right apex. Repeat PA and lateral
chest x-ray recommended, when the patient is able, without overlying
clothing.

## 2017-01-23 ENCOUNTER — Other Ambulatory Visit: Payer: Self-pay | Admitting: Physician Assistant

## 2017-01-25 ENCOUNTER — Ambulatory Visit (INDEPENDENT_AMBULATORY_CARE_PROVIDER_SITE_OTHER): Payer: Medicare Other | Admitting: Family Medicine

## 2017-01-25 ENCOUNTER — Encounter: Payer: Self-pay | Admitting: Family Medicine

## 2017-01-25 VITALS — BP 120/80 | HR 58 | Temp 97.6°F | Wt 188.6 lb

## 2017-01-25 DIAGNOSIS — B0229 Other postherpetic nervous system involvement: Secondary | ICD-10-CM

## 2017-01-25 MED ORDER — GABAPENTIN 100 MG PO CAPS: ORAL_CAPSULE | ORAL | 3 refills | Status: DC

## 2017-01-25 NOTE — Progress Notes (Signed)
Subjective:     Patient ID: Ricardo Valencia, male   DOB: 12/30/1928, 83 y.o.   MRN: [id number]  HPI Patient had severe case of shingles involving right side of face and right neck last year. He's had some persistent postherpetic neuralgia pains. He rates pain 5 out of 10 severity and sometimes wakes at night. He had been on gabapentin but stopped this himself several months ago.  Has an achy and sometimes burning type pain which is not triggered by anything in particular. Sometimes takes Tylenol with minimal relief.  Past Medical History:  Diagnosis Date  . Allergy    hay fever, allergies  . CAD (coronary artery disease)    DES to mid LAD 11/10/2014  . Chronic systolic heart failure ([place] [facility] [place])   . History of echocardiogram    Echo 3/17: EF 40%, diff HK, trivial AI, trivial MR, mod LAE, mild reduced RVSF, mod RAE  . Hyperlipidemia   . Hypertension   . Persistent atrial fibrillation [facility])    Past Surgical History:  Procedure Laterality Date  . APPENDECTOMY  1947  . CARDIOVERSION N/A 12/30/2014   Procedure: CARDIOVERSION;  Surgeon: Jerline Pain, MD;  Location: [place];  Service: Cardiovascular;  Laterality: N/A;  . LEFT HEART CATHETERIZATION WITH CORONARY ANGIOGRAM N/A 11/10/2014   Procedure: LEFT HEART CATHETERIZATION WITH CORONARY ANGIOGRAM;  Surgeon: Wellington Hampshire, MD;  Location: [place] CATH LAB;  Service: Cardiovascular;  Laterality: N/A;  . PERCUTANEOUS CORONARY STENT INTERVENTION (PCI-S)  11/10/2014   Procedure: PERCUTANEOUS CORONARY STENT INTERVENTION (PCI-S);  Surgeon: Wellington Hampshire, MD;  Location: [facility] CATH LAB;  Service: Cardiovascular;;    reports that he has never smoked. He has never used smokeless tobacco. He reports that he does not drink alcohol or use drugs. family history includes Cancer in his father and mother. No Known Allergies   Review of Systems  Constitutional: Negative for chills and fever.       Objective:   Physical Exam  Constitutional: He is oriented  to person, place, and time. He appears well-developed and well-nourished.  Cardiovascular: Normal rate.   Pulmonary/Chest: Effort normal and breath sounds normal. No respiratory distress. He has no wheezes. He has no rales.  Neurological: He is alert and oriented to person, place, and time. No cranial nerve deficit.  Skin: No rash noted.       Assessment:     Postherpetic neuralgia involving right side of face following severe case of shingles over year ago    Plan:     Start back gabapentin 100 mg daily at bedtime and may increase to 1 twice a day and after 1 week and further increase to 2 tablets twice daily as needed.    reassess in one month  Eulas Post MD [facility]

## 2017-01-25 NOTE — Patient Instructions (Signed)
Postherpetic Neuralgia Postherpetic neuralgia (PHN) is nerve pain that occurs after a shingles infection. Shingles is a painful rash that appears on one side of the body, usually on your trunk or face. Shingles is caused by the varicella-zoster virus. This is the same virus that causes chickenpox. In people who have had chickenpox, the virus can resurface years later and cause shingles. You may have PHN if you continue to have pain for 3 months after your shingles rash has gone away. PHN appears in the same area where you had the shingles rash. For most people, PHN goes away within 1 year. Getting a vaccination for shingles can prevent PHN. This vaccine is recommended for people older than 50. It may prevent shingles and may also lower your risk of PHN if you do get shingles. What are the causes? PHN is caused by damage to your nerves from the varicella-zoster virus. This damage makes your nerves overly sensitive. What increases the risk? Aging is the biggest risk factor for developing PHN. Most people who get PHN are older than 60. Other risk factors include:  Having very bad pain before your shingles rash starts.  Having a very bad rash.  Having shingles in the nerve that supplies your face and eye (trigeminal nerve). What are the signs or symptoms? Pain is the main symptom of PHN. The pain is often very bad and may be described as stabbing, burning, or feeling like an electric shock. The pain may come and go or may be there all the time. Pain may be triggered by light touches on the skin or changes in temperature. You may have itching along with the pain. How is this diagnosed? Your health care provider may diagnose PHN based on your symptoms and your history of shingles. Lab studies and other diagnostic tests are usually not needed. How is this treated? There is no cure for PHN. Treatment for PHN will focus on pain relief. Over-the-counter pain relievers do not usually relieve PHN pain. You  may need to work with a pain specialist. Treatment may include:  Antidepressant medicines to help with pain and improve sleep.  Antiseizure medicines to relieve nerve pain.  Strong pain relievers (opioids).  A numbing patch worn on the skin (lidocaine patch). Follow these instructions at home: It may take a long time to recover from PHN. Work closely with your health care provider, and have a good support system at home.  Take all medicines as directed by your health care provider.  Wear loose, comfortable clothing.  Cover sensitive areas with a dressing to reduce friction from clothing rubbing on the area.  If cold does not make your pain worse, try applying a cool compress or cooling gel pack to the area.  Talk to your health care provider if you feel depressed or desperate. Living with long-term pain can be depressing. Contact a health care provider if:  Your medicine is not helping.  You are struggling to manage your pain at home. This information is not intended to replace advice given to you by your health care provider. Make sure you discuss any questions you have with your health care provider. Document Released: 11/24/2002 Document Revised: 02/09/2016 Document Reviewed: 08/25/2013 Elsevier Interactive Patient Education  2017 Elsevier Inc.  

## 2017-02-01 ENCOUNTER — Ambulatory Visit (INDEPENDENT_AMBULATORY_CARE_PROVIDER_SITE_OTHER): Payer: Medicare Other | Admitting: *Deleted

## 2017-02-01 DIAGNOSIS — I4891 Unspecified atrial fibrillation: Secondary | ICD-10-CM | POA: Diagnosis not present

## 2017-02-01 DIAGNOSIS — Z5181 Encounter for therapeutic drug level monitoring: Secondary | ICD-10-CM

## 2017-02-01 DIAGNOSIS — Z7901 Long term (current) use of anticoagulants: Secondary | ICD-10-CM | POA: Diagnosis not present

## 2017-02-01 LAB — POCT INR: INR: 4.1

## 2017-02-04 ENCOUNTER — Other Ambulatory Visit: Payer: Self-pay | Admitting: Physician Assistant

## 2017-02-22 ENCOUNTER — Ambulatory Visit (INDEPENDENT_AMBULATORY_CARE_PROVIDER_SITE_OTHER): Payer: Medicare Other | Admitting: *Deleted

## 2017-02-22 DIAGNOSIS — I4891 Unspecified atrial fibrillation: Secondary | ICD-10-CM | POA: Diagnosis not present

## 2017-02-22 DIAGNOSIS — Z5181 Encounter for therapeutic drug level monitoring: Secondary | ICD-10-CM

## 2017-02-22 DIAGNOSIS — Z7901 Long term (current) use of anticoagulants: Secondary | ICD-10-CM

## 2017-02-22 LAB — POCT INR: INR: 1.8

## 2017-02-24 ENCOUNTER — Other Ambulatory Visit: Payer: Self-pay | Admitting: Physician Assistant

## 2017-02-27 IMAGING — DX DG CHEST 2V
2 series · 2 of 2 positions shown · non-contrast
Comparison: Chest radiograph performed 10/10/2015

CLINICAL DATA: Acute onset of shortness of breath. Initial
encounter.

EXAM:
CHEST  2 VIEW

[w chest pa]
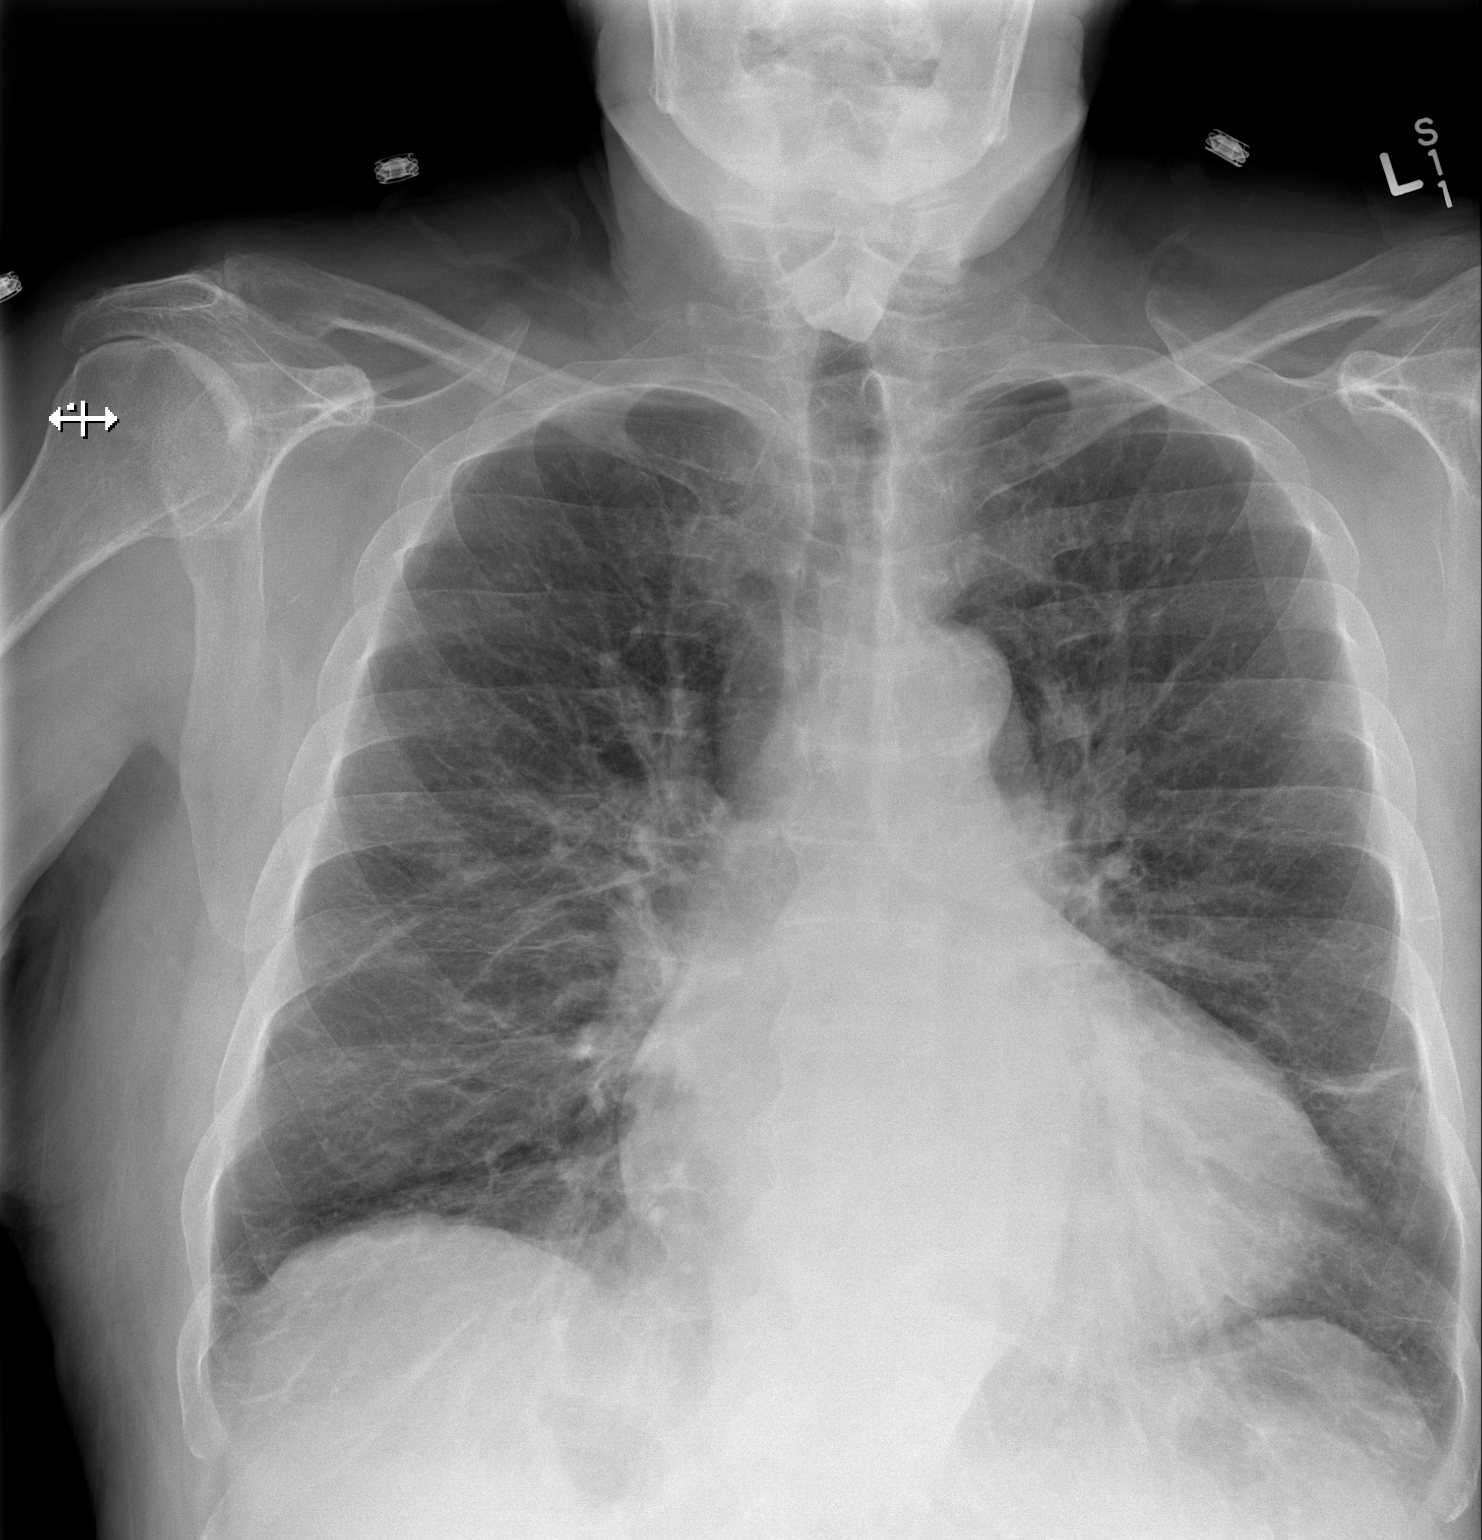

[w chest lat]
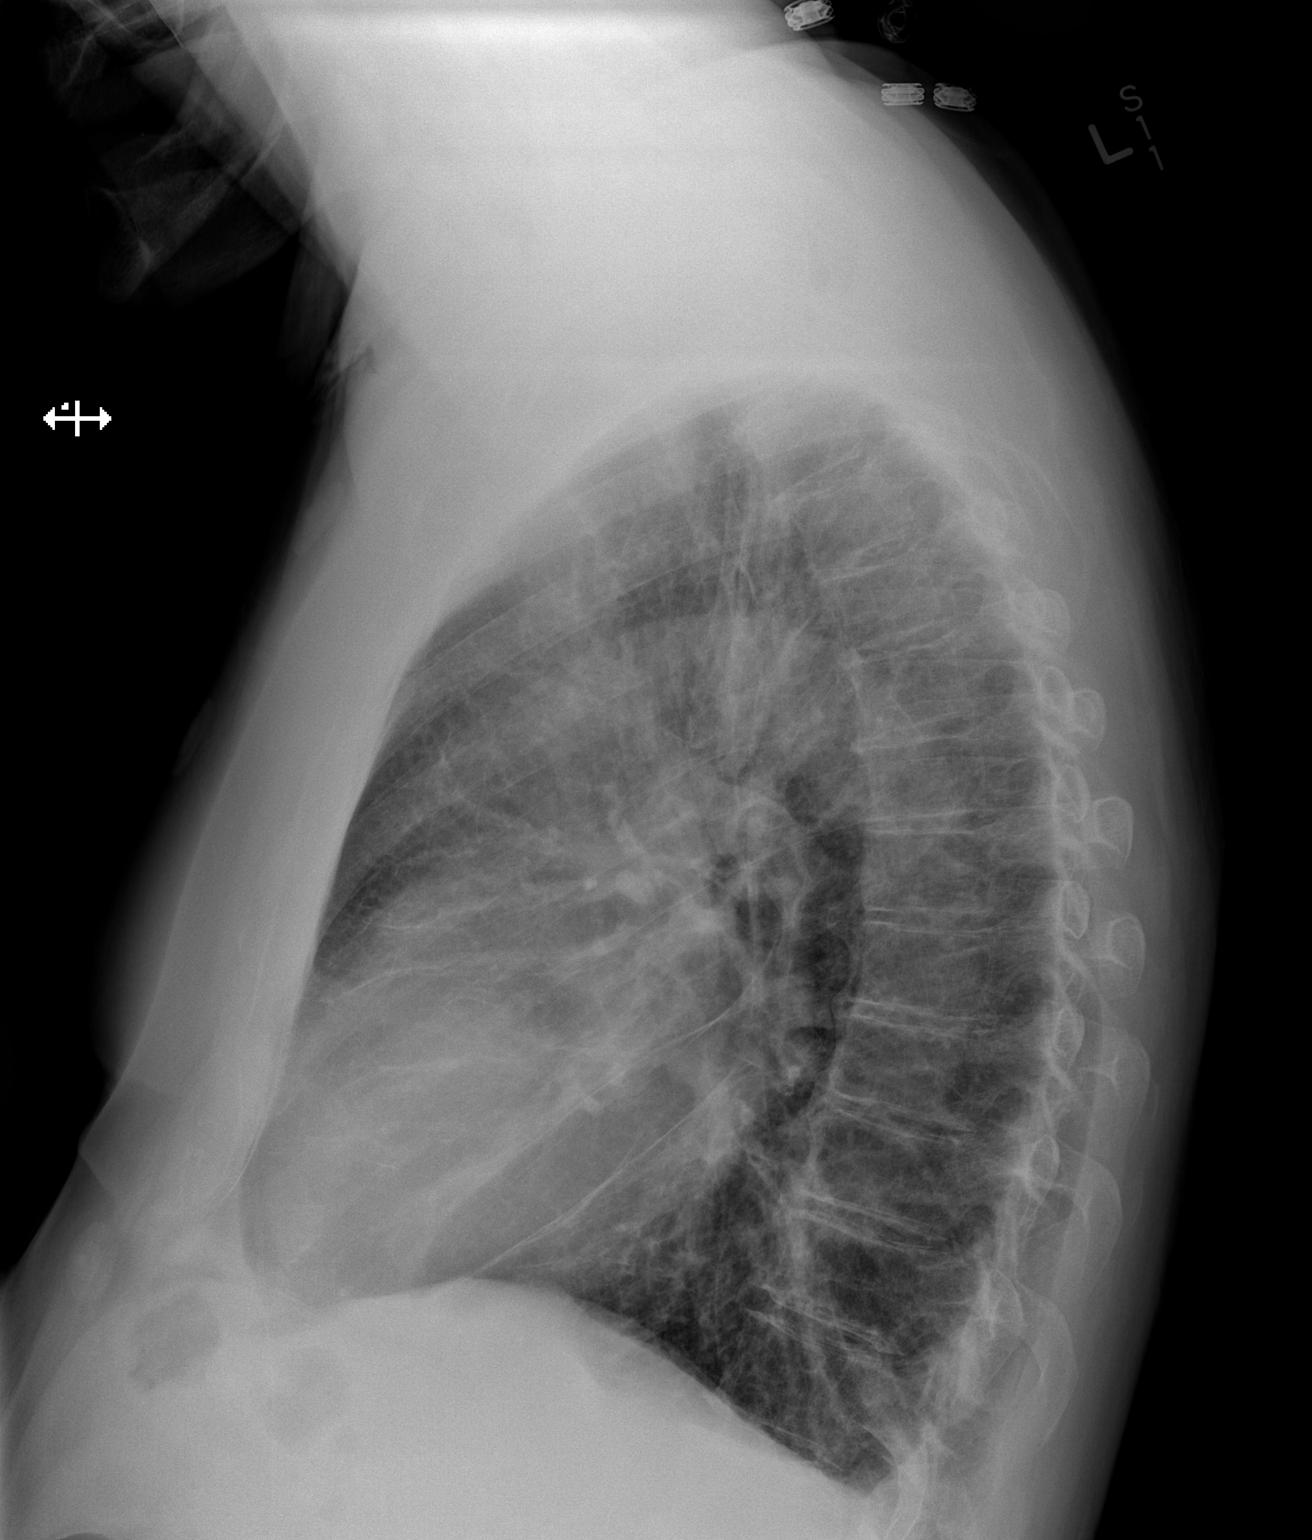

[2 of 2 positions shown; findings below may reference images not displayed]

FINDINGS: The lungs are well-aerated. Vascular congestion is noted. Increased
interstitial markings may reflect mild interstitial edema. Mild
bibasilar atelectasis is noted. There is no evidence of pleural
effusion or pneumothorax.

The heart is borderline enlarged. No acute osseous abnormalities are
seen.
IMPRESSION: Vascular congestion and borderline cardiomegaly. Increased
interstitial markings may reflect mild interstitial edema. Mild
bibasilar atelectasis noted.

## 2017-03-01 ENCOUNTER — Ambulatory Visit (INDEPENDENT_AMBULATORY_CARE_PROVIDER_SITE_OTHER): Payer: Medicare Other | Admitting: Family Medicine

## 2017-03-01 ENCOUNTER — Encounter: Payer: Self-pay | Admitting: Family Medicine

## 2017-03-01 DIAGNOSIS — B0229 Other postherpetic nervous system involvement: Secondary | ICD-10-CM

## 2017-03-01 MED ORDER — GABAPENTIN 300 MG PO CAPS: 300.0000 mg | ORAL_CAPSULE | Freq: Two times a day (BID) | ORAL | 3 refills | Status: DC

## 2017-03-01 NOTE — Patient Instructions (Signed)
Increase the Gabapentin to 300 mg twice daily.

## 2017-03-01 NOTE — Progress Notes (Signed)
Subjective:     Patient ID: Ricardo Valencia, male   DOB: 12-08-1928, 83 y.o.   MRN: [id number]  HPI Patient here to discuss postherpetic neuralgia. We had him start back gabapentin currently up to 200 mg twice daily last visit. He still has considerable night pain and frequently has pain 7/10 severity. His pain is somewhat intermittent. No recurrent rash. Sometimes interfering with sleep. Denies any side effects from medication. One of his daughters had inquired about other possible medications such as amitriptyline. We expressed our concerns because of his age with anticholinergic medications such as tricyclics.  Past Medical History:  Diagnosis Date  . Allergy    hay fever, allergies  . CAD (coronary artery disease)    DES to mid LAD 11/10/2014  . Chronic systolic heart failure ([place])   . History of echocardiogram    Echo 3/17: EF 40%, diff HK, trivial AI, trivial MR, mod LAE, mild reduced RVSF, mod RAE  . Hyperlipidemia   . Hypertension   . Persistent atrial fibrillation [facility])    Past Surgical History:  Procedure Laterality Date  . APPENDECTOMY  1947  . CARDIOVERSION N/A 12/30/2014   Procedure: CARDIOVERSION;  Surgeon: Jerline Pain, MD;  Location: [place];  Service: Cardiovascular;  Laterality: N/A;  . LEFT HEART CATHETERIZATION WITH CORONARY ANGIOGRAM N/A 11/10/2014   Procedure: LEFT HEART CATHETERIZATION WITH CORONARY ANGIOGRAM;  Surgeon: Wellington Hampshire, MD;  Location: [place] CATH LAB;  Service: Cardiovascular;  Laterality: N/A;  . PERCUTANEOUS CORONARY STENT INTERVENTION (PCI-S)  11/10/2014   Procedure: PERCUTANEOUS CORONARY STENT INTERVENTION (PCI-S);  Surgeon: Wellington Hampshire, MD;  Location: [facility] CATH LAB;  Service: Cardiovascular;;    reports that he has never smoked. He has never used smokeless tobacco. He reports that he does not drink alcohol or use drugs. family history includes Cancer in his father and mother. No Known Allergies   Review of Systems  Constitutional:  Negative for fatigue.  Eyes: Negative for visual disturbance.  Respiratory: Negative for cough, chest tightness and shortness of breath.   Cardiovascular: Negative for chest pain, palpitations and leg swelling.  Neurological: Negative for dizziness, syncope, weakness, light-headedness and headaches.       Objective:   Physical Exam  Constitutional: He appears well-developed and well-nourished.  HENT:  Right Ear: External ear normal.  Left Ear: External ear normal.  Mouth/Throat: Oropharynx is clear and moist.  Neck: Neck supple.  Cardiovascular: Normal rate.   Pulmonary/Chest: Effort normal and breath sounds normal. No respiratory distress. He has no wheezes. He has no rales.  Lymphadenopathy:    He has no cervical adenopathy.       Assessment:     Postherpetic neuralgia involving right neck and right side of face following severe case of shingles over year ago    Plan:     -Increase gabapentin to 300 mg twice daily with new prescription given -We explained our rationale for trying to avoid tricyclic-specifically avoiding anticholinergic side effects and especially with his cardiac history -Follow-up in one month to reassess. Consider further titration of gabapentin versus additional Cymbalta that point if not further improved  Eulas Post MD [facility] at [facility]

## 2017-03-03 ENCOUNTER — Other Ambulatory Visit: Payer: Self-pay | Admitting: Cardiovascular Disease

## 2017-03-03 ENCOUNTER — Other Ambulatory Visit: Payer: Self-pay | Admitting: Physician Assistant

## 2017-03-04 NOTE — Telephone Encounter (Signed)
Medication Detail    Disp Refills Start End   KLOR-CON M10 10 MEQ tablet 90 tablet 0 02/26/2017    Sig: TAKE ONE TABLET BY MOUTH ONCE DAILY   Notes to Pharmacy: Please keep upcoming appointment for future refills. Thank you   E-Prescribing Status: Receipt confirmed by pharmacy (02/26/2017 9:28 AM EDT)   Pharmacy   [facility], [place] - [address]

## 2017-03-07 ENCOUNTER — Other Ambulatory Visit: Payer: Self-pay | Admitting: Physician Assistant

## 2017-03-18 ENCOUNTER — Telehealth: Payer: Self-pay | Admitting: Cardiovascular Disease

## 2017-03-18 NOTE — Telephone Encounter (Signed)
I would avoid salty foods if possible, elevate legs as much as possible. He can take an extra Lasix 40 mg for the next several days also if he would like. Follow daily weights.   Thanks, chris

## 2017-03-18 NOTE — Telephone Encounter (Signed)
I spoke with pt's wife and gave her information from Dr. McAlhany.  

## 2017-03-18 NOTE — Telephone Encounter (Signed)
I spoke with pt's wife. She noticed swelling in pt's feet and ankles today. She has not noticed this before but she states pt told her they are always this way.  [place] the beach. Arrived 2 days ago.  Pt has not weighed in several days.  Did have shortness of breath once yesterday while climbing stairs.  Later was able to climb the stairs without problem.  Ate steak and mashed potatoes last night for dinner.  Also drank alcohol.  Is not feeling palpitations or fast heart beat.  Taking lasix 40 mg daily.  Will forward to Dr. Angelena Form for review/recommendations.

## 2017-03-18 NOTE — Telephone Encounter (Signed)
New message     Pt c/o swelling: STAT is pt has developed SOB within 24 hours  1. How long have you been experiencing swelling? 1 week  2. Where is the swelling located? feet  3.  Are you currently taking a "fluid pill"? Yes   4.  Are you currently SOB? no  5.  Have you traveled recently? At the [place]

## 2017-03-26 ENCOUNTER — Ambulatory Visit (INDEPENDENT_AMBULATORY_CARE_PROVIDER_SITE_OTHER): Payer: Medicare Other | Admitting: *Deleted

## 2017-03-26 DIAGNOSIS — Z5181 Encounter for therapeutic drug level monitoring: Secondary | ICD-10-CM | POA: Diagnosis not present

## 2017-03-26 DIAGNOSIS — I4891 Unspecified atrial fibrillation: Secondary | ICD-10-CM | POA: Diagnosis not present

## 2017-03-26 DIAGNOSIS — Z7901 Long term (current) use of anticoagulants: Secondary | ICD-10-CM | POA: Diagnosis not present

## 2017-03-26 LAB — POCT INR: INR: 3.2

## 2017-03-28 ENCOUNTER — Ambulatory Visit (INDEPENDENT_AMBULATORY_CARE_PROVIDER_SITE_OTHER): Payer: Medicare Other | Admitting: Cardiovascular Disease

## 2017-03-28 ENCOUNTER — Encounter: Payer: Self-pay | Admitting: Cardiovascular Disease

## 2017-03-28 VITALS — BP 110/60 | HR 78 | Ht 69.0 in | Wt 194.4 lb

## 2017-03-28 DIAGNOSIS — I481 Persistent atrial fibrillation: Secondary | ICD-10-CM

## 2017-03-28 DIAGNOSIS — I255 Ischemic cardiomyopathy: Secondary | ICD-10-CM

## 2017-03-28 DIAGNOSIS — I4819 Other persistent atrial fibrillation: Secondary | ICD-10-CM

## 2017-03-28 DIAGNOSIS — I10 Essential (primary) hypertension: Secondary | ICD-10-CM

## 2017-03-28 DIAGNOSIS — I5023 Acute on chronic systolic (congestive) heart failure: Secondary | ICD-10-CM | POA: Diagnosis not present

## 2017-03-28 DIAGNOSIS — I2510 Atherosclerotic heart disease of native coronary artery without angina pectoris: Secondary | ICD-10-CM

## 2017-03-28 MED ORDER — POTASSIUM CHLORIDE CRYS ER 20 MEQ PO TBCR: 20.0000 meq | EXTENDED_RELEASE_TABLET | Freq: Two times a day (BID) | ORAL | 0 refills | Status: DC

## 2017-03-28 MED ORDER — FUROSEMIDE 40 MG PO TABS: 40.0000 mg | ORAL_TABLET | Freq: Two times a day (BID) | ORAL | 0 refills | Status: DC

## 2017-03-28 NOTE — Progress Notes (Signed)
Chief Complaint  Patient presents with  . Shortness of Breath     History of Present Illness: 83 yo male with history of CAD, ischemic cardiomyopathy, HTN, HLD, PAF who is here today for cardiac follow up. He was admitted to [facility] 11/07/14 with atrial fib with RVR. LV systolic function was decreased. LVEF noted to be 25-30% with moderate MR by echo 11/07/14. Cardiac cath 11/10/14 with severe LAD stenosis. This was treated with a 2.75 x 18 mm Xience DES x 1. Moderate disease in the RCA. He was brought back for DCCV 12/30/14 and converted to sinus. Repeat echo 01/13/15 with LVEF=45-50%. No MR noted. Admitted January 2017 with shingles and atrial fib with RVR. Toprol increased and Cardizem stopped. Readmitted March 2017 with CHF and atrial fib with RVR. Diuresed and Cardizem restarted for rate control.  Echo March 2017 with LVEF=40%. He was admitted to [facility] October 2017 with dyspnea. Nuclear stress test without ischemia. Lasix was increased.   He is here today for follow up. The patient denies any chest pain, palpitations, orthopnea, PND, dizziness, near syncope or syncope. He does endorse weight gain, LE edema and dyspnea with exertion.   Primary Care Physician: Eulas Post, MD  Past Medical History:  Diagnosis Date  . Allergy    hay fever, allergies  . CAD (coronary artery disease)    DES to mid LAD 11/10/2014  . Chronic systolic heart failure ([place])   . History of echocardiogram    Echo 3/17: EF 40%, diff HK, trivial AI, trivial MR, mod LAE, mild reduced RVSF, mod RAE  . Hyperlipidemia   . Hypertension   . Persistent atrial fibrillation [facility])     Past Surgical History:  Procedure Laterality Date  . APPENDECTOMY  1947  . CARDIOVERSION N/A 12/30/2014   Procedure: CARDIOVERSION;  Surgeon: Jerline Pain, MD;  Location: [place];  Service: Cardiovascular;  Laterality: N/A;  . LEFT HEART CATHETERIZATION WITH CORONARY ANGIOGRAM N/A 11/10/2014   Procedure: LEFT HEART CATHETERIZATION  WITH CORONARY ANGIOGRAM;  Surgeon: Wellington Hampshire, MD;  Location: [place] CATH LAB;  Service: Cardiovascular;  Laterality: N/A;  . PERCUTANEOUS CORONARY STENT INTERVENTION (PCI-S)  11/10/2014   Procedure: PERCUTANEOUS CORONARY STENT INTERVENTION (PCI-S);  Surgeon: Wellington Hampshire, MD;  Location: [facility] CATH LAB;  Service: Cardiovascular;;    Current Outpatient Prescriptions  Medication Sig Dispense Refill  . acetaminophen (TYLENOL) 500 MG tablet Take 500 mg by mouth every 4 (four) hours as needed for fever (pain).    . Ascorbic Acid (VITAMIN C) 1000 MG tablet Take 1,000 mg by mouth daily.    Marland Kitchen aspirin EC 81 MG EC tablet Take 1 tablet (81 mg total) by mouth daily.    Marland Kitchen atorvastatin (LIPITOR) 40 MG tablet TAKE ONE TABLET BY MOUTH DAILY AT 6PM. 30 tablet 11  . CARTIA XT 120 MG 24 hr capsule TAKE ONE CAPSULE BY MOUTH ONCE DAILY 90 capsule 1  . furosemide (LASIX) 40 MG tablet Take 1 tablet (40 mg total) by mouth daily. 30 tablet 12  . gabapentin (NEURONTIN) 300 MG capsule Take 1 capsule (300 mg total) by mouth 2 (two) times daily. 180 capsule 3  . KLOR-CON M10 10 MEQ tablet TAKE 1 TABLET BY MOUTH ONCE DAILY 90 tablet 1  . lisinopril (PRINIVIL,ZESTRIL) 5 MG tablet TAKE ONE TABLET BY MOUTH ONCE DAILY 90 tablet 1  . metoprolol (LOPRESSOR) 100 MG tablet TAKE ONE TABLET BY MOUTH TWICE DAILY 180 tablet 1  . nitroGLYCERIN (NITROSTAT) 0.4  MG SL tablet Place 1 tablet (0.4 mg total) under the tongue every 5 (five) minutes x 3 doses as needed for chest pain. 25 tablet 2  . warfarin (COUMADIN) 5 MG tablet TAKE BY MOUTH AS DIRECTED BY COUMADIN CLINIC 105 tablet 1   No current facility-administered medications for this visit.     No Known Allergies  Social History   Social History  . Marital status: Married    Spouse name: N/A  . Number of children: N/A  . Years of education: N/A   Occupational History  . Not on file.   Social History Main Topics  . Smoking status: Never Smoker  . Smokeless tobacco:  Never Used  . Alcohol use No  . Drug use: No  . Sexual activity: Not on file   Other Topics Concern  . Not on file   Social History Narrative  . No narrative on file    Family History  Problem Relation Age of Onset  . Cancer Mother        breast  . Cancer Father        colon    Review of Systems:  As stated in the HPI and otherwise negative.   BP 110/60   Pulse 78   Ht 5\' 9"  (1.753 m)   Wt 194 lb 6.4 oz (88.2 kg)   SpO2 98%   BMI 28.71 kg/m   Physical Examination: General: Well developed, well nourished, NAD  HEENT: OP clear, mucus membranes moist  SKIN: warm, dry. No rashes. Neuro: No focal deficits  Musculoskeletal: Muscle strength 5/5 all ext  Psychiatric: Mood and affect normal  Neck: No JVD, no carotid bruits, no thyromegaly, no lymphadenopathy.  Lungs:Clear bilaterally, no wheezes, rhonci, crackles Cardiovascular: Irreg irreg with no murmurs, gallops or rubs. Abdomen:Soft. Bowel sounds present. Non-tender.  Extremities: 1+ bilateral lower extremity edema. Pulses are 2 + in the bilateral DP/PT.    Echo 12/13/15: Left ventricle: The cavity size was normal. Wall thickness was  normal. Indeterminant diastolic function (atrial fibrillation).  The estimated ejection fraction was 40%. Diffuse hypokinesis. - Aortic valve: There was no stenosis. There was trivial  regurgitation. - Mitral valve: There was trivial regurgitation. - Left atrium: The atrium was moderately dilated. - Right ventricle: The cavity size was normal. Systolic function  was mildly reduced. - Right atrium: The atrium was moderately dilated. - Pulmonary arteries: No complete TR doppler jet so unable to  estimate PA systolic pressure. - Inferior vena cava: The vessel was normal in size. The  respirophasic diameter changes were in the normal range (>= 50%),  consistent with normal central venous pressure.  Impressions:  - The patient was in atrial fibrillation. EF 40%, diffuse   hypokinesis. Normal RV size with mildly decreased systolic  function. No significant valvular abnormalities. Moderate  biatrial enlargement.   Cardiac cath 11/10/14: Coronary dominance: Right   Left Main: Normal  Left Anterior Descending (LAD): Normal in size with minor irregularities in the proximal segment. There is a 90% discrete stenosis in the midsegment just before the origin of the second diagonal. The rest of the vessel has minor irregularities and wraps around the apex.  1st diagonal (D1): Normal in size with 40% proximal stenosis.  2nd diagonal (D2): Normal in size with 20% ostial stenosis.  3rd diagonal (D3): Small in size with minor irregularities.  Circumflex (LCx): Normal in size with minor irregularities.  1st obtuse marginal: Small in size with minor irregularities.  2nd obtuse marginal: Medium in  size with minor irregularities.  3rd obtuse marginal: Medium in size with minor irregularities.   Right Coronary Artery: Large in size and dominant. The vessel has minor irregularities. There is 50% stenosis distally before the bifurcation  Posterior descending artery: Large in size with no significant disease.  Left ventriculography: Left ventricular systolic function is moderately to severely reduced , LVEF is estimated at 30-35 %, there is no significant mitral regurgitation   EKG:  EKG is ordered today. The ekg ordered today demonstrates ATrial fib, rate 60 bpm. Low voltage  Recent Labs: 06/23/2016: B Natriuretic Peptide 505.9 06/24/2016: BUN 8; Creatinine, Ser 1.09; Hemoglobin 15.2; Platelets 133; Potassium 4.3; Sodium 138   Lipid Panel    Component Value Date/Time   CHOL 150 02/15/2016 1155   TRIG 98 02/15/2016 1155   HDL 66 02/15/2016 1155   CHOLHDL 2.3 02/15/2016 1155   VLDL 20 02/15/2016 1155   LDLCALC 64 02/15/2016 1155   LDLDIRECT 191.1 01/04/2011 1218     Wt Readings from Last 3 Encounters:  03/28/17 194 lb 6.4 oz (88.2  kg)  03/01/17 193 lb (87.5 kg)  01/25/17 188 lb 9.6 oz (85.5 kg)     Other studies Reviewed: Additional studies/ records that were reviewed today include:. Review of the above records demonstrates:    Assessment and Plan:   1. CAD without angina: No chest pain suggestive of angina. Will continue ASA, statin and beta blocker.    2. HTN: BP controlled. No changes  3. Hyperlipidemia: Continue statin.   4. Atrial fibrillation, persistent: He is in atrial fib. Rate controlled. NO changes. Continue coumadin.    5. Ischemic cardiomyopathy: LVEF=40% by echo March 2017. Will continue beta blocker and Ace-inh.   6. Acute on Chronic systolic CHF: He is up 8 lbs over the last month with increased LE edema and dyspnea. Increase lasix to 40 mg po BID and increase potassium to 20 meq BID. BMET one week. Adjust Lasix then.   Current medicines are reviewed at length with the patient today.  The patient does not have concerns regarding medicines.  The following changes have been made:  no change  Labs/ tests ordered today include:   No orders of the defined types were placed in this encounter.   Disposition:   FU with me in 3 weeks  Signed, Lauree Chandler, MD 03/28/2017 3:39 PM    [address], [place], [place]  [address] Phone: [phone]; Fax: [phone]

## 2017-03-28 NOTE — Patient Instructions (Signed)
Medication Instructions:  Your physician has recommended you make the following change in your medication:  Increase furosemide to 40 mg by mouth twice daily for one week. Increase potassium to 20 meq by mouth twice daily for one week. Adjustments to be made after blood work next week.    Labwork: Your physician recommends that you return for lab work in: one week--04/04/17.  BMP.  The lab opens at 7:30 AM   Testing/Procedures: none  Follow-Up: Keep scheduled appointment with Dr. Angelena Form on 04/25/17  Any Other Special Instructions Will Be Listed Below (If Applicable).     If you need a refill on your cardiac medications before your next appointment, please call your pharmacy.

## 2017-04-04 ENCOUNTER — Other Ambulatory Visit: Payer: Medicare Other | Admitting: *Deleted

## 2017-04-04 DIAGNOSIS — I5023 Acute on chronic systolic (congestive) heart failure: Secondary | ICD-10-CM

## 2017-04-04 DIAGNOSIS — I255 Ischemic cardiomyopathy: Secondary | ICD-10-CM | POA: Diagnosis not present

## 2017-04-04 LAB — BASIC METABOLIC PANEL
BUN/Creatinine Ratio: 12 (ref 10–24)
BUN: 20 mg/dL (ref 8–27)
CALCIUM: 8.6 mg/dL (ref 8.6–10.2)
CHLORIDE: 98 mmol/L (ref 96–106)
CO2: 24 mmol/L (ref 20–29)
Creatinine, Ser: 1.62 mg/dL — ABNORMAL HIGH (ref 0.76–1.27)
GFR calc non Af Amer: 38 mL/min/{1.73_m2} — ABNORMAL LOW (ref >59)
GFR, EST AFRICAN AMERICAN: 44 mL/min/{1.73_m2} — AB (ref >59)
Glucose: 104 mg/dL — ABNORMAL HIGH (ref 65–99)
Potassium: 4.2 mmol/L (ref 3.5–5.2)
Sodium: 139 mmol/L (ref 134–144)

## 2017-04-05 ENCOUNTER — Other Ambulatory Visit: Payer: Self-pay | Admitting: *Deleted

## 2017-04-05 ENCOUNTER — Telehealth: Payer: Self-pay | Admitting: Cardiovascular Disease

## 2017-04-05 DIAGNOSIS — I5022 Chronic systolic (congestive) heart failure: Secondary | ICD-10-CM

## 2017-04-05 MED ORDER — FUROSEMIDE 40 MG PO TABS: 40.0000 mg | ORAL_TABLET | Freq: Every day | ORAL | 0 refills | Status: DC

## 2017-04-05 MED ORDER — POTASSIUM CHLORIDE CRYS ER 10 MEQ PO TBCR: 10.0000 meq | EXTENDED_RELEASE_TABLET | Freq: Every day | ORAL | 0 refills | Status: DC

## 2017-04-05 MED ORDER — POTASSIUM CHLORIDE CRYS ER 20 MEQ PO TBCR: 20.0000 meq | EXTENDED_RELEASE_TABLET | Freq: Every day | ORAL | 0 refills | Status: DC

## 2017-04-05 NOTE — Telephone Encounter (Signed)
I spoke with pt's wife. She is confused regarding potassium dose.  When pt was here to see Dr. Angelena Form on 7/12 she told us pt was taking potassium 10 meq twice daily.  This was increased to 20 meq twice daily when furosemide was increased at this office visit.  Based on lab results from 7/19 furosemide was decreased to 40 mg once daily and pt was to return to previous potassium dose.  Instead of 10 meq twice daily he was going to take 20 meq once daily as he has 20 meq tablets. Wife now reports previous dose was 10 meq once daily.  Pt has both 10 meq and 20 meq tablets.  I instructed pt to go back to dose of 10 meq daily and keep appointment for BMP on 7/26.  I asked wife to bring all medication bottles to next appt with Dr. Angelena Form on 8/9

## 2017-04-05 NOTE — Telephone Encounter (Signed)
New message      Pt talked to the nurse this am.  She has another question to ask.  Please call

## 2017-04-11 ENCOUNTER — Other Ambulatory Visit: Payer: Medicare Other | Admitting: *Deleted

## 2017-04-11 DIAGNOSIS — I5022 Chronic systolic (congestive) heart failure: Secondary | ICD-10-CM | POA: Diagnosis not present

## 2017-04-11 LAB — BASIC METABOLIC PANEL
BUN / CREAT RATIO: 11 (ref 10–24)
BUN: 16 mg/dL (ref 8–27)
CHLORIDE: 103 mmol/L (ref 96–106)
CO2: 22 mmol/L (ref 20–29)
Calcium: 8.5 mg/dL — ABNORMAL LOW (ref 8.6–10.2)
Creatinine, Ser: 1.42 mg/dL — ABNORMAL HIGH (ref 0.76–1.27)
GFR calc non Af Amer: 45 mL/min/{1.73_m2} — ABNORMAL LOW (ref >59)
GFR, EST AFRICAN AMERICAN: 52 mL/min/{1.73_m2} — AB (ref >59)
GLUCOSE: 114 mg/dL — AB (ref 65–99)
POTASSIUM: 4.3 mmol/L (ref 3.5–5.2)
SODIUM: 138 mmol/L (ref 134–144)

## 2017-04-11 NOTE — Progress Notes (Signed)
Subjective:   Ricardo Valencia is a 83 y.o. male who presents for an Initial Medicare Annual Wellness Visit.  Works PT with [employer].   Review of Systems  No ROS.  Medicare Wellness Visit. Additional risk factors are reflected in the social history.  Cardiac Risk Factors include: advanced age (>16men, >75 women);hypertension;dyslipidemia;male gender   Sleep patterns: Sleeps 8 hours, feels rested. Up to void x 3.  Home Safety/Smoke Alarms: Feels safe in home. Smoke alarms in place.  Living environment; residence and Firearm Safety: Lives with wife in 1 story home.  Seat Belt Safety/Bike Helmet: Wears seat belt.   Counseling:   Eye Exam-With license exam.  Dental-Full dentures.   Male:   CCS-Never, Aged out     PSA- No results found for: PSA     Objective:    Today's Vitals   04/12/17 1022  BP: 110/64  Pulse: 60  Temp: 97.9 F (36.6 C)  TempSrc: Oral  SpO2: 97%  Weight: 193 lb 1.6 oz (87.6 kg)   Body mass index is 28.52 kg/m.  Current Medications (verified) Outpatient Encounter Prescriptions as of 04/12/2017  Medication Sig  . acetaminophen (TYLENOL) 500 MG tablet Take 500 mg by mouth every 4 (four) hours as needed for fever (pain).  . Ascorbic Acid (VITAMIN C) 1000 MG tablet Take 1,000 mg by mouth daily.  Marland Kitchen aspirin EC 81 MG EC tablet Take 1 tablet (81 mg total) by mouth daily.  Marland Kitchen atorvastatin (LIPITOR) 40 MG tablet TAKE ONE TABLET BY MOUTH DAILY AT 6PM.  . CARTIA XT 120 MG 24 hr capsule TAKE ONE CAPSULE BY MOUTH ONCE DAILY  . furosemide (LASIX) 40 MG tablet Take 1 tablet (40 mg total) by mouth daily.  Marland Kitchen gabapentin (NEURONTIN) 300 MG capsule Take 1 capsule (300 mg total) by mouth 2 (two) times daily.  Marland Kitchen lisinopril (PRINIVIL,ZESTRIL) 5 MG tablet TAKE ONE TABLET BY MOUTH ONCE DAILY  . metoprolol (LOPRESSOR) 100 MG tablet TAKE ONE TABLET BY MOUTH TWICE DAILY  . nitroGLYCERIN (NITROSTAT) 0.4 MG SL tablet Place 1 tablet (0.4 mg total) under the tongue every 5 (five)  minutes x 3 doses as needed for chest pain.  . potassium chloride SA (K-DUR,KLOR-CON) 10 MEQ tablet Take 1 tablet (10 mEq total) by mouth daily.  Marland Kitchen warfarin (COUMADIN) 5 MG tablet TAKE BY MOUTH AS DIRECTED BY COUMADIN CLINIC  . DULoxetine (CYMBALTA) 30 MG capsule Take 1 capsule (30 mg total) by mouth daily.  . DULoxetine (CYMBALTA) 60 MG capsule Take 1 capsule (60 mg total) by mouth daily.   No facility-administered encounter medications on file as of 04/12/2017.     Allergies (verified) Patient has no known allergies.   History: Past Medical History:  Diagnosis Date  . Allergy    hay fever, allergies  . CAD (coronary artery disease)    DES to mid LAD 11/10/2014  . Chronic systolic heart failure ([place])   . History of echocardiogram    Echo 3/17: EF 40%, diff HK, trivial AI, trivial MR, mod LAE, mild reduced RVSF, mod RAE  . Hyperlipidemia   . Hypertension   . Persistent atrial fibrillation [facility])    Past Surgical History:  Procedure Laterality Date  . APPENDECTOMY  1947  . CARDIOVERSION N/A 12/30/2014   Procedure: CARDIOVERSION;  Surgeon: Jerline Pain, MD;  Location: [place];  Service: Cardiovascular;  Laterality: N/A;  . LEFT HEART CATHETERIZATION WITH CORONARY ANGIOGRAM N/A 11/10/2014   Procedure: LEFT HEART CATHETERIZATION WITH CORONARY ANGIOGRAM;  Surgeon: Wellington Hampshire, MD;  Location: [facility] CATH LAB;  Service: Cardiovascular;  Laterality: N/A;  . PERCUTANEOUS CORONARY STENT INTERVENTION (PCI-S)  11/10/2014   Procedure: PERCUTANEOUS CORONARY STENT INTERVENTION (PCI-S);  Surgeon: Wellington Hampshire, MD;  Location[facility] CATH LAB;  Service: Cardiovascular;;   Family History  Problem Relation Age of Onset  . Cancer Mother        breast  . Cancer Father        colon   Social History   Occupational History  . Not on file.   Social History Main Topics  . Smoking status: Never Smoker  . Smokeless tobacco: Never Used  . Alcohol use No  . Drug use: No  . Sexual activity: Not  on file   Tobacco Counseling Counseling given: Not Answered   Activities of Daily Living In your present state of health, do you have any difficulty performing the following activities: 04/12/2017 06/24/2016  Hearing? N N  Vision? N N  Difficulty concentrating or making decisions? N N  Walking or climbing stairs? N N  Dressing or bathing? N N  Doing errands, shopping? N N  Preparing Food and eating ? N -  Using the Toilet? N -  In the past six months, have you accidently leaked urine? N -  Do you have problems with loss of bowel control? N -  Managing your Medications? N -  Managing your Finances? N -  Housekeeping or managing your Housekeeping? N -  Some recent data might be hidden    Immunizations and Health Maintenance Immunization History  Administered Date(s) Administered  . Pneumococcal Conjugate-13 12/23/2014  . Pneumococcal Polysaccharide-23 03/02/2016  . Tdap 01/07/2014   There are no preventive care reminders to display for this patient.  Patient Care Team: Eulas Post, MD as PCP - General (Family Medicine) Burnell Blanks, MD as Consulting Physician (Cardiology)  Indicate any recent Medical Services you may have received from other than [facility] providers in the past year (date may be approximate).    Assessment:   This is a routine wellness examination for Ricardo Valencia. Physical assessment deferred to PCP.   Hearing/Vision screen  Hearing Screening   Method: Audiometry   125Hz  250Hz  500Hz  1000Hz  2000Hz  3000Hz  4000Hz  6000Hz  8000Hz   Right ear:   Pass Fail Fail  Fail    Left ear:   Pass Fail Fail  Fail    Comments: Audiology referral ordered.   Vision Screening Comments: Wears reading glasses.   Dietary issues and exercise activities discussed: Exercise limited by: None identified   Diet (meal preparation, eat out, water intake, caffeinated beverages, dairy products, fruits and vegetables): Drinks sweet tea, water and whiskey with tea.    Breakfast: fruit, juice, coffee, crumb cake Lunch: sandwich with lettuce Dinner: protein, vegetables.     Discussed heart healthy diet and activity.   Goals      Patient Stated   . patient (pt-stated)          Maintain current health by staying active.       Depression Screen PHQ 2/9 Scores 04/12/2017 12/23/2014  PHQ - 2 Score 0 0    Fall Risk Fall Risk  04/12/2017 08/23/2016 12/23/2014  Falls in the past year? No No No    Cognitive Function:       Ad8 score reviewed for issues:  Issues making decisions: no  Less interest in hobbies / activities: no  Repeats questions, stories (family complaining): no  Trouble using ordinary gadgets (microwave,  computer, phone): no  Forgets the month or year:  no  Mismanaging finances:  no  Remembering appts: no  Daily problems with thinking and/or memory: no Ad8 score is=0     Screening Tests Health Maintenance  Topic Date Due  . INFLUENZA VACCINE  04/17/2017  . TETANUS/TDAP  01/08/2024  . PNA vac Low Risk Adult  Completed   Declines Shingrix.      Plan:    Bring a copy of your advance directives to your next office visit.  Continue doing brain stimulating activities (puzzles, reading, adult coloring books, staying active) to keep memory sharp.   Schedule Audiology consult.   I have personally reviewed and noted the following in the patient's chart:   . Medical and social history . Use of alcohol, tobacco or illicit drugs  . Current medications and supplements . Functional ability and status . Nutritional status . Physical activity . Advanced directives . List of other physicians . Hospitalizations, surgeries, and ER visits in previous 12 months . Vitals . Screenings to include cognitive, depression, and falls . Referrals and appointments  In addition, I have reviewed and discussed with patient certain preventive protocols, quality metrics, and best practice recommendations. A written personalized care  plan for preventive services as well as general preventive health recommendations were provided to patient.     Gerilyn Nestle, RN   04/12/2017   Agree with assessment as above.  Needs Audiology evaluation as above.  Eulas Post MD [facility] at [facility]

## 2017-04-12 ENCOUNTER — Ambulatory Visit (INDEPENDENT_AMBULATORY_CARE_PROVIDER_SITE_OTHER): Payer: Medicare Other | Admitting: Family Medicine

## 2017-04-12 ENCOUNTER — Encounter: Payer: Self-pay | Admitting: Family Medicine

## 2017-04-12 VITALS — BP 110/64 | HR 60 | Temp 97.9°F | Wt 193.1 lb

## 2017-04-12 DIAGNOSIS — Z0000 Encounter for general adult medical examination without abnormal findings: Secondary | ICD-10-CM

## 2017-04-12 DIAGNOSIS — R94120 Abnormal auditory function study: Secondary | ICD-10-CM | POA: Diagnosis not present

## 2017-04-12 DIAGNOSIS — B0229 Other postherpetic nervous system involvement: Secondary | ICD-10-CM | POA: Diagnosis not present

## 2017-04-12 DIAGNOSIS — I255 Ischemic cardiomyopathy: Secondary | ICD-10-CM | POA: Diagnosis not present

## 2017-04-12 MED ORDER — DULOXETINE HCL 30 MG PO CPEP: 30.0000 mg | ORAL_CAPSULE | Freq: Every day | ORAL | 0 refills | Status: DC

## 2017-04-12 MED ORDER — DULOXETINE HCL 60 MG PO CPEP: 60.0000 mg | ORAL_CAPSULE | Freq: Every day | ORAL | 5 refills | Status: DC

## 2017-04-12 NOTE — Progress Notes (Signed)
Subjective:     Patient ID: Ricardo Valencia, male   DOB: September 11, 1929, 83 y.o.   MRN: [id number]  HPI Patient here for follow-up regarding his postherpetic neuralgia which radiates from his right neck to right face. Also here for Medicare Wellness exam with our health coach.    He had severe shingles couple years ago. He has been dealing with pain ever since then. His pain was waking him up frequent night. He is currently on gabapentin 300 mg twice daily. His pain seems to be better controlled at night and also during the morning. He is having more problems now with afternoon pain. He previously rated his pain 7 out of 10 now 5 out of 10 though apparently and afternoons considerably worse.  His wife states that he is complaining of this daily.    Unfortunately, he's had some increased peripheral edema recently. Had recent labs through cardiology and these were reviewed. We explained that of course the gabapentin may be worsening his edema somewhat. He denies any orthopnea.  Past Medical History:  Diagnosis Date  . Allergy    hay fever, allergies  . CAD (coronary artery disease)    DES to mid LAD 11/10/2014  . Chronic systolic heart failure ([place])   . History of echocardiogram    Echo 3/17: EF 40%, diff HK, trivial AI, trivial MR, mod LAE, mild reduced RVSF, mod RAE  . Hyperlipidemia   . Hypertension   . Persistent atrial fibrillation [facility])    Past Surgical History:  Procedure Laterality Date  . APPENDECTOMY  1947  . CARDIOVERSION N/A 12/30/2014   Procedure: CARDIOVERSION;  Surgeon: Jerline Pain, MD;  Location: [place];  Service: Cardiovascular;  Laterality: N/A;  . LEFT HEART CATHETERIZATION WITH CORONARY ANGIOGRAM N/A 11/10/2014   Procedure: LEFT HEART CATHETERIZATION WITH CORONARY ANGIOGRAM;  Surgeon: Wellington Hampshire, MD;  Location: [place] CATH LAB;  Service: Cardiovascular;  Laterality: N/A;  . PERCUTANEOUS CORONARY STENT INTERVENTION (PCI-S)  11/10/2014   Procedure: PERCUTANEOUS  CORONARY STENT INTERVENTION (PCI-S);  Surgeon: Wellington Hampshire, MD;  Location: [place] CATH LAB;  Service: Cardiovascular;;    reports that he has never smoked. He has never used smokeless tobacco. He reports that he does not drink alcohol or use drugs. family history includes Cancer in his father and mother. No Known Allergies   Review of Systems  Respiratory: Negative for cough and shortness of breath.   Cardiovascular: Positive for leg swelling. Negative for chest pain and palpitations.  Neurological: Negative for headaches.       Objective:   Physical Exam  Constitutional: He appears well-developed and well-nourished.  HENT:  Mouth/Throat: Oropharynx is clear and moist.  Neck: Neck supple. No thyromegaly present.  Cardiovascular: Normal rate and regular rhythm.   Pulmonary/Chest: Effort normal and breath sounds normal. No respiratory distress. He has no wheezes. He has no rales.  Musculoskeletal:  Patient has 1+ nonpitting edema legs bilaterally  Lymphadenopathy:    He has no cervical adenopathy.       Assessment:     Postherpetic neuralgia. Improved with gabapentin but possible worsening edema.  Still has poor pain control middle of day.    Plan:     -I am reluctant to increase his gabapentin further to 3 times a day because of edema issues. -Recommend trial of Cymbalta 30 mg once daily for 2 weeks and titrate then to 60 mg -Avoid anticholinergic medications such as tricyclics with his age and other health issues -reassess in one  month.  Eulas Post MD [facility] at [facility]

## 2017-04-12 NOTE — Patient Instructions (Addendum)
Start the cymbalta 30 mg once daily for two weeks Then increase to Cymbalta 60 mg once daily.   Ricardo Valencia , Thank you for taking time to come for your Medicare Wellness Visit. I appreciate your ongoing commitment to your health goals. Please review the following plan we discussed and let me know if I can assist you in the future.   These are the goals we discussed: Goals      Patient Stated   . patient (pt-stated)          Maintain current health by staying active.        This is a list of the screening recommended for you and due dates:  Health Maintenance  Topic Date Due  . Flu Shot  04/17/2017  . Tetanus Vaccine  01/08/2024  . Pneumonia vaccines  Completed   Bring a copy of your advance directives to your next office visit.  Continue doing brain stimulating activities (puzzles, reading, adult coloring books, staying active) to keep memory sharp.   Schedule Audiology consult.   Health Maintenance, Male A healthy lifestyle and preventive care is important for your health and wellness. Ask your health care provider about what schedule of regular examinations is right for you. What should I know about weight and diet? Eat a Healthy Diet  Eat plenty of vegetables, fruits, whole grains, low-fat dairy products, and lean protein.  Do not eat a lot of foods high in solid fats, added sugars, or salt.  Maintain a Healthy Weight Regular exercise can help you achieve or maintain a healthy weight. You should:  Do at least 150 minutes of exercise each week. The exercise should increase your heart rate and make you sweat (moderate-intensity exercise).  Do strength-training exercises at least twice a week.  Watch Your Levels of Cholesterol and Blood Lipids  Have your blood tested for lipids and cholesterol every 5 years starting at 81 years of age. If you are at high risk for heart disease, you should start having your blood tested when you are 81 years old. You may need to  have your cholesterol levels checked more often if: ? Your lipid or cholesterol levels are high. ? You are older than 81 years of age. ? You are at high risk for heart disease.  What should I know about cancer screening? Many types of cancers can be detected early and may often be prevented. Lung Cancer  You should be screened every year for lung cancer if: ? You are a current smoker who has smoked for at least 30 years. ? You are a former smoker who has quit within the past 15 years.  Talk to your health care provider about your screening options, when you should start screening, and how often you should be screened.  Colorectal Cancer  Routine colorectal cancer screening usually begins at 81 years of age and should be repeated every 5-10 years until you are 80 years old. You may need to be screened more often if early forms of precancerous polyps or small growths are found. Your health care provider may recommend screening at an earlier age if you have risk factors for colon cancer.  Your health care provider may recommend using home test kits to check for hidden blood in the stool.  A small camera at the end of a tube can be used to examine your colon (sigmoidoscopy or colonoscopy). This checks for the earliest forms of colorectal cancer.  Prostate and Testicular Cancer  Depending on  your age and overall health, your health care provider may do certain tests to screen for prostate and testicular cancer.  Talk to your health care provider about any symptoms or concerns you have about testicular or prostate cancer.  Skin Cancer  Check your skin from head to toe regularly.  Tell your health care provider about any new moles or changes in moles, especially if: ? There is a change in a mole's size, shape, or color. ? You have a mole that is larger than a pencil eraser.  Always use sunscreen. Apply sunscreen liberally and repeat throughout the day.  Protect yourself by wearing  long sleeves, pants, a wide-brimmed hat, and sunglasses when outside.  What should I know about heart disease, diabetes, and high blood pressure?  If you are 39-65 years of age, have your blood pressure checked every 3-5 years. If you are 34 years of age or older, have your blood pressure checked every year. You should have your blood pressure measured twice-once when you are at a hospital or clinic, and once when you are not at a hospital or clinic. Record the average of the two measurements. To check your blood pressure when you are not at a hospital or clinic, you can use: ? An automated blood pressure machine at a pharmacy. ? A home blood pressure monitor.  Talk to your health care provider about your target blood pressure.  If you are between 35-29 years old, ask your health care provider if you should take aspirin to prevent heart disease.  Have regular diabetes screenings by checking your fasting blood sugar level. ? If you are at a normal weight and have a low risk for diabetes, have this test once every three years after the age of 58. ? If you are overweight and have a high risk for diabetes, consider being tested at a younger age or more often.  A one-time screening for abdominal aortic aneurysm (AAA) by ultrasound is recommended for men aged 27-75 years who are current or former smokers. What should I know about preventing infection? Hepatitis B If you have a higher risk for hepatitis B, you should be screened for this virus. Talk with your health care provider to find out if you are at risk for hepatitis B infection. Hepatitis C Blood testing is recommended for:  Everyone born from [birth year] through 1963.  Anyone with known risk factors for hepatitis C.  Sexually Transmitted Diseases (STDs)  You should be screened each year for STDs including gonorrhea and chlamydia if: ? You are sexually active and are younger than 81 years of age. ? You are older than 81 years of age and your  health care provider tells you that you are at risk for this type of infection. ? Your sexual activity has changed since you were last screened and you are at an increased risk for chlamydia or gonorrhea. Ask your health care provider if you are at risk.  Talk with your health care provider about whether you are at high risk of being infected with HIV. Your health care provider may recommend a prescription medicine to help prevent HIV infection.  What else can I do?  Schedule regular health, dental, and eye exams.  Stay current with your vaccines (immunizations).  Do not use any tobacco products, such as cigarettes, chewing tobacco, and e-cigarettes. If you need help quitting, ask your health care provider.  Limit alcohol intake to no more than 2 drinks per day. One drink equals 12  ounces of beer, 5 ounces of wine, or 1 ounces of hard liquor.  Do not use street drugs.  Do not share needles.  Ask your health care provider for help if you need support or information about quitting drugs.  Tell your health care provider if you often feel depressed.  Tell your health care provider if you have ever been abused or do not feel safe at home. This information is not intended to replace advice given to you by your health care provider. Make sure you discuss any questions you have with your health care provider. Document Released: 03/01/2008 Document Revised: 05/02/2016 Document Reviewed: 06/07/2015 Elsevier Interactive Patient Education  [employer].

## 2017-04-15 ENCOUNTER — Telehealth (HOSPITAL_COMMUNITY): Payer: Self-pay | Admitting: Vascular Surgery

## 2017-04-15 NOTE — Telephone Encounter (Signed)
Left pt message to return call to make new pt appt

## 2017-04-16 ENCOUNTER — Ambulatory Visit (INDEPENDENT_AMBULATORY_CARE_PROVIDER_SITE_OTHER): Payer: Medicare Other | Admitting: Pharmacist

## 2017-04-16 DIAGNOSIS — I4891 Unspecified atrial fibrillation: Secondary | ICD-10-CM | POA: Diagnosis not present

## 2017-04-16 DIAGNOSIS — Z7901 Long term (current) use of anticoagulants: Secondary | ICD-10-CM

## 2017-04-16 DIAGNOSIS — I255 Ischemic cardiomyopathy: Secondary | ICD-10-CM

## 2017-04-16 DIAGNOSIS — Z5181 Encounter for therapeutic drug level monitoring: Secondary | ICD-10-CM | POA: Diagnosis not present

## 2017-04-16 LAB — POCT INR: INR: 2.8

## 2017-04-25 ENCOUNTER — Encounter: Payer: Self-pay | Admitting: Cardiovascular Disease

## 2017-04-25 ENCOUNTER — Ambulatory Visit (INDEPENDENT_AMBULATORY_CARE_PROVIDER_SITE_OTHER): Payer: Medicare Other | Admitting: Cardiovascular Disease

## 2017-04-25 VITALS — BP 94/60 | HR 64 | Ht 69.0 in | Wt 193.4 lb

## 2017-04-25 DIAGNOSIS — I2510 Atherosclerotic heart disease of native coronary artery without angina pectoris: Secondary | ICD-10-CM

## 2017-04-25 DIAGNOSIS — E785 Hyperlipidemia, unspecified: Secondary | ICD-10-CM | POA: Diagnosis not present

## 2017-04-25 DIAGNOSIS — I5023 Acute on chronic systolic (congestive) heart failure: Secondary | ICD-10-CM | POA: Diagnosis not present

## 2017-04-25 DIAGNOSIS — I481 Persistent atrial fibrillation: Secondary | ICD-10-CM | POA: Diagnosis not present

## 2017-04-25 DIAGNOSIS — I10 Essential (primary) hypertension: Secondary | ICD-10-CM

## 2017-04-25 DIAGNOSIS — I255 Ischemic cardiomyopathy: Secondary | ICD-10-CM | POA: Diagnosis not present

## 2017-04-25 DIAGNOSIS — I4819 Other persistent atrial fibrillation: Secondary | ICD-10-CM

## 2017-04-25 NOTE — Progress Notes (Signed)
Chief Complaint  Patient presents with  . Follow-up    dyspnea     History of Present Illness: 83 yo male with history of CAD, ischemic cardiomyopathy, HTN, HLD, PAF who is here today for cardiac follow up. He was admitted to [facility] 11/07/14 with atrial fib with RVR. LV systolic function was decreased. LVEF noted to be 25-30% with moderate MR by echo 11/07/14. Cardiac cath 11/10/14 with severe LAD stenosis. This was treated with a 2.75 x 18 mm Xience DES x 1. Moderate disease in the RCA. He was brought back for DCCV 12/30/14 and converted to sinus. Repeat echo 01/13/15 with LVEF=45-50%. No MR noted. Admitted January 2017 with shingles and atrial fib with RVR. Toprol increased and Cardizem stopped. Readmitted March 2017 with CHF and atrial fib with RVR. Diuresed and Cardizem restarted for rate control.  Echo March 2017 with LVEF=40%. He was admitted to [facility] October 2017 with dyspnea. Nuclear stress test without ischemia. Lasix was increased. I saw him in my office 03/28/17 and he c/o weight gain, LE edema and dyspnea on exertion. Lasix was increased but his renal function worsened so Lasix was reduced back to once per day.   He is here today for follow up. He is actually feeling well overall. He has dyspnea with anything more than slow walking and gets very little exercise. His LE edema is slightly improved. He has been drinking in excess of 2 liters of water per day. Weight is stable at home. He denies chest pain, palpitations, orthopnea, PND, dizziness, near syncope or syncope.    Primary Care Physician: Eulas Post, MD  Past Medical History:  Diagnosis Date  . Allergy    hay fever, allergies  . CAD (coronary artery disease)    DES to mid LAD 11/10/2014  . Chronic systolic heart failure ([place])   . History of echocardiogram    Echo 3/17: EF 40%, diff HK, trivial AI, trivial MR, mod LAE, mild reduced RVSF, mod RAE  . Hyperlipidemia   . Hypertension   . Persistent atrial fibrillation [facility])       Past Surgical History:  Procedure Laterality Date  . APPENDECTOMY  1947  . CARDIOVERSION N/A 12/30/2014   Procedure: CARDIOVERSION;  Surgeon: Jerline Pain, MD;  Location: [place];  Service: Cardiovascular;  Laterality: N/A;  . LEFT HEART CATHETERIZATION WITH CORONARY ANGIOGRAM N/A 11/10/2014   Procedure: LEFT HEART CATHETERIZATION WITH CORONARY ANGIOGRAM;  Surgeon: Wellington Hampshire, MD;  Location: [place] CATH LAB;  Service: Cardiovascular;  Laterality: N/A;  . PERCUTANEOUS CORONARY STENT INTERVENTION (PCI-S)  11/10/2014   Procedure: PERCUTANEOUS CORONARY STENT INTERVENTION (PCI-S);  Surgeon: Wellington Hampshire, MD;  Location: [facility] CATH LAB;  Service: Cardiovascular;;    Current Outpatient Prescriptions  Medication Sig Dispense Refill  . acetaminophen (TYLENOL) 500 MG tablet Take 500 mg by mouth every 4 (four) hours as needed for fever (pain).    . Ascorbic Acid (VITAMIN C) 1000 MG tablet Take 1,000 mg by mouth daily.    Marland Kitchen aspirin EC 81 MG EC tablet Take 1 tablet (81 mg total) by mouth daily.    Marland Kitchen atorvastatin (LIPITOR) 40 MG tablet TAKE ONE TABLET BY MOUTH DAILY AT 6PM. 30 tablet 11  . CARTIA XT 120 MG 24 hr capsule TAKE ONE CAPSULE BY MOUTH ONCE DAILY 90 capsule 1  . DULoxetine (CYMBALTA) 30 MG capsule Take 1 capsule (30 mg total) by mouth daily. 14 capsule 0  . furosemide (LASIX) 40 MG tablet Take  1 tablet (40 mg total) by mouth daily. 30 tablet 0  . gabapentin (NEURONTIN) 300 MG capsule Take 1 capsule (300 mg total) by mouth 2 (two) times daily. 180 capsule 3  . lisinopril (PRINIVIL,ZESTRIL) 5 MG tablet TAKE ONE TABLET BY MOUTH ONCE DAILY 90 tablet 1  . metoprolol (LOPRESSOR) 100 MG tablet TAKE ONE TABLET BY MOUTH TWICE DAILY 180 tablet 1  . nitroGLYCERIN (NITROSTAT) 0.4 MG SL tablet Place 1 tablet (0.4 mg total) under the tongue every 5 (five) minutes x 3 doses as needed for chest pain. 25 tablet 2  . potassium chloride SA (K-DUR,KLOR-CON) 10 MEQ tablet Take 1 tablet (10 mEq total) by  mouth daily. 30 tablet 0  . warfarin (COUMADIN) 5 MG tablet TAKE BY MOUTH AS DIRECTED BY COUMADIN CLINIC 105 tablet 1   No current facility-administered medications for this visit.     No Known Allergies  Social History   Social History  . Marital status: Married    Spouse name: N/A  . Number of children: N/A  . Years of education: N/A   Occupational History  . Not on file.   Social History Main Topics  . Smoking status: Never Smoker  . Smokeless tobacco: Never Used  . Alcohol use No  . Drug use: No  . Sexual activity: Not on file   Other Topics Concern  . Not on file   Social History Narrative  . No narrative on file    Family History  Problem Relation Age of Onset  . Cancer Mother        breast  . Cancer Father        colon    Review of Systems:  As stated in the HPI and otherwise negative.   BP 94/60   Pulse 64   Ht 5\' 9"  (1.753 m)   Wt 193 lb 6.4 oz (87.7 kg)   SpO2 98%   BMI 28.56 kg/m   Physical Examination: General: Well developed, well nourished, NAD  HEENT: OP clear, mucus membranes moist  SKIN: warm, dry. No rashes. Neuro: No focal deficits  Musculoskeletal: Muscle strength 5/5 all ext  Psychiatric: Mood and affect normal  Neck: No JVD, no carotid bruits, no thyromegaly, no lymphadenopathy.  Lungs:Clear bilaterally, no wheezes, rhonci, crackles Cardiovascular: Regular rate and rhythm. No murmurs, gallops or rubs. Abdomen:Soft. Bowel sounds present. Non-tender.  Extremities: Trace to 1+ bilateral lower extremity edema. Pulses are 2 + in the bilateral DP/PT.  Echo 12/13/15: Left ventricle: The cavity size was normal. Wall thickness was  normal. Indeterminant diastolic function (atrial fibrillation).  The estimated ejection fraction was 40%. Diffuse hypokinesis. - Aortic valve: There was no stenosis. There was trivial  regurgitation. - Mitral valve: There was trivial regurgitation. - Left atrium: The atrium was moderately dilated. -  Right ventricle: The cavity size was normal. Systolic function  was mildly reduced. - Right atrium: The atrium was moderately dilated. - Pulmonary arteries: No complete TR doppler jet so unable to  estimate PA systolic pressure. - Inferior vena cava: The vessel was normal in size. The  respirophasic diameter changes were in the normal range (>= 50%),  consistent with normal central venous pressure.  Impressions:  - The patient was in atrial fibrillation. EF 40%, diffuse  hypokinesis. Normal RV size with mildly decreased systolic  function. No significant valvular abnormalities. Moderate  biatrial enlargement.   Cardiac cath 11/10/14: Coronary dominance: Right   Left Main: Normal  Left Anterior Descending (LAD): Normal in size  with minor irregularities in the proximal segment. There is a 90% discrete stenosis in the midsegment just before the origin of the second diagonal. The rest of the vessel has minor irregularities and wraps around the apex.  1st diagonal (D1): Normal in size with 40% proximal stenosis.  2nd diagonal (D2): Normal in size with 20% ostial stenosis.  3rd diagonal (D3): Small in size with minor irregularities.  Circumflex (LCx): Normal in size with minor irregularities.  1st obtuse marginal: Small in size with minor irregularities.  2nd obtuse marginal: Medium in size with minor irregularities.  3rd obtuse marginal: Medium in size with minor irregularities.   Right Coronary Artery: Large in size and dominant. The vessel has minor irregularities. There is 50% stenosis distally before the bifurcation  Posterior descending artery: Large in size with no significant disease.  Left ventriculography: Left ventricular systolic function is moderately to severely reduced , LVEF is estimated at 30-35 %, there is no significant mitral regurgitation   EKG:  EKG is not ordered today. The ekg ordered today demonstrates   Recent  Labs: 06/23/2016: B Natriuretic Peptide 505.9 06/24/2016: Hemoglobin 15.2; Platelets 133 04/11/2017: BUN 16; Creatinine, Ser 1.42; Potassium 4.3; Sodium 138   Lipid Panel    Component Value Date/Time   CHOL 150 02/15/2016 1155   TRIG 98 02/15/2016 1155   HDL 66 02/15/2016 1155   CHOLHDL 2.3 02/15/2016 1155   VLDL 20 02/15/2016 1155   LDLCALC 64 02/15/2016 1155   LDLDIRECT 191.1 01/04/2011 1218     Wt Readings from Last 3 Encounters:  04/25/17 193 lb 6.4 oz (87.7 kg)  04/12/17 193 lb 1.6 oz (87.6 kg)  03/28/17 194 lb 6.4 oz (88.2 kg)     Other studies Reviewed: Additional studies/ records that were reviewed today include:. Review of the above records demonstrates:    Assessment and Plan:   1. CAD without angina: No chest pain suggestive of angina. Continue statin, ASA and beta blocker.     2. HTN: BP controlled. No changes.   3. Hyperlipidemia: Continue statin  4. Atrial fibrillation, persistent: Rate controlled. No changes. He is on coumadin.   5. Ischemic cardiomyopathy: Echo 2017 with LVEF=40%. Continue beta blocker and statin.   6. Acute on Chronic systolic CHF: His volume status seems to be better and he is overall doing better. He has an appt in one month to see our advanced heart failure team for recs on medical therapy. Continue Lasix 40 mg once daily.   Current medicines are reviewed at length with the patient today.  The patient does not have concerns regarding medicines.  The following changes have been made:  no change  Labs/ tests ordered today include:   No orders of the defined types were placed in this encounter.  Disposition:   FU with me in 3 months.   Signed, Lauree Chandler, MD 04/25/2017 3:52 PM    [facility] [address], [place], [place]  [address] Phone: [phone]; Fax: [phone]

## 2017-04-25 NOTE — Patient Instructions (Signed)
Medication Instructions:  Your physician recommends that you continue on your current medications as directed. Please refer to the Current Medication list given to you today.   Labwork: none  Testing/Procedures: none  Follow-Up: Your physician recommends that you schedule a follow-up appointment in: 6 months. Please call our office in about 3 months to schedule this appointment  Keep appointment in heart failure clinic   Any Other Special Instructions Will Be Listed Below (If Applicable).     If you need a refill on your cardiac medications before your next appointment, please call your pharmacy.

## 2017-04-30 ENCOUNTER — Telehealth: Payer: Self-pay | Admitting: Family Medicine

## 2017-04-30 MED ORDER — DULOXETINE HCL 30 MG PO CPEP: 30.0000 mg | ORAL_CAPSULE | Freq: Every day | ORAL | 5 refills | Status: DC

## 2017-04-30 NOTE — Telephone Encounter (Signed)
OK to leave at 30 mg- refill this dose for 6 months.

## 2017-04-30 NOTE — Telephone Encounter (Signed)
Pt wife said cymbalta 30 mg is working fine and she does not feel like he should increase to 60 mg. Please advise

## 2017-04-30 NOTE — Telephone Encounter (Signed)
Wife is aware and refill sent

## 2017-05-16 ENCOUNTER — Ambulatory Visit (INDEPENDENT_AMBULATORY_CARE_PROVIDER_SITE_OTHER): Payer: Medicare Other

## 2017-05-16 DIAGNOSIS — Z5181 Encounter for therapeutic drug level monitoring: Secondary | ICD-10-CM | POA: Diagnosis not present

## 2017-05-16 DIAGNOSIS — Z7901 Long term (current) use of anticoagulants: Secondary | ICD-10-CM

## 2017-05-16 DIAGNOSIS — I4891 Unspecified atrial fibrillation: Secondary | ICD-10-CM | POA: Diagnosis not present

## 2017-05-16 DIAGNOSIS — I255 Ischemic cardiomyopathy: Secondary | ICD-10-CM | POA: Diagnosis not present

## 2017-05-16 LAB — PROTIME-INR
INR: 8.6 (ref 0.8–1.2)
Prothrombin Time: 90.7 s — ABNORMAL HIGH (ref 9.1–12.0)

## 2017-05-16 LAB — POCT INR

## 2017-05-23 ENCOUNTER — Ambulatory Visit (INDEPENDENT_AMBULATORY_CARE_PROVIDER_SITE_OTHER): Payer: Medicare Other | Admitting: *Deleted

## 2017-05-23 ENCOUNTER — Ambulatory Visit (HOSPITAL_COMMUNITY)
Admission: RE | Admit: 2017-05-23 | Discharge: 2017-05-23 | Disposition: A | Discharge status: Home / Self Care | Payer: Medicare Other | Source: Ambulatory Visit | Attending: Cardiology | Admitting: Cardiology

## 2017-05-23 ENCOUNTER — Encounter (HOSPITAL_COMMUNITY): Payer: Self-pay | Admitting: Cardiology

## 2017-05-23 VITALS — BP 134/72 | HR 69 | Wt 192.0 lb

## 2017-05-23 DIAGNOSIS — I4891 Unspecified atrial fibrillation: Secondary | ICD-10-CM

## 2017-05-23 DIAGNOSIS — Z7901 Long term (current) use of anticoagulants: Secondary | ICD-10-CM

## 2017-05-23 DIAGNOSIS — Z5181 Encounter for therapeutic drug level monitoring: Secondary | ICD-10-CM | POA: Diagnosis not present

## 2017-05-23 DIAGNOSIS — I2510 Atherosclerotic heart disease of native coronary artery without angina pectoris: Secondary | ICD-10-CM | POA: Diagnosis not present

## 2017-05-23 DIAGNOSIS — I255 Ischemic cardiomyopathy: Secondary | ICD-10-CM

## 2017-05-23 DIAGNOSIS — I130 Hypertensive heart and chronic kidney disease with heart failure and stage 1 through stage 4 chronic kidney disease, or unspecified chronic kidney disease: Secondary | ICD-10-CM | POA: Diagnosis not present

## 2017-05-23 DIAGNOSIS — Z800 Family history of malignant neoplasm of digestive organs: Secondary | ICD-10-CM | POA: Diagnosis not present

## 2017-05-23 DIAGNOSIS — I429 Cardiomyopathy, unspecified: Secondary | ICD-10-CM | POA: Diagnosis not present

## 2017-05-23 DIAGNOSIS — E785 Hyperlipidemia, unspecified: Secondary | ICD-10-CM | POA: Insufficient documentation

## 2017-05-23 DIAGNOSIS — Z803 Family history of malignant neoplasm of breast: Secondary | ICD-10-CM | POA: Insufficient documentation

## 2017-05-23 DIAGNOSIS — I5022 Chronic systolic (congestive) heart failure: Secondary | ICD-10-CM | POA: Diagnosis not present

## 2017-05-23 DIAGNOSIS — Z79899 Other long term (current) drug therapy: Secondary | ICD-10-CM | POA: Diagnosis not present

## 2017-05-23 LAB — BASIC METABOLIC PANEL
ANION GAP: 8 (ref 5–15)
BUN: 14 mg/dL (ref 6–20)
CHLORIDE: 103 mmol/L (ref 101–111)
CO2: 26 mmol/L (ref 22–32)
Calcium: 8.8 mg/dL — ABNORMAL LOW (ref 8.9–10.3)
Creatinine, Ser: 1.46 mg/dL — ABNORMAL HIGH (ref 0.61–1.24)
GFR calc Af Amer: 49 mL/min — ABNORMAL LOW (ref >60)
GFR, EST NON AFRICAN AMERICAN: 42 mL/min — AB (ref >60)
GLUCOSE: 93 mg/dL (ref 65–99)
POTASSIUM: 4.5 mmol/L (ref 3.5–5.1)
Sodium: 137 mmol/L (ref 135–145)

## 2017-05-23 LAB — BRAIN NATRIURETIC PEPTIDE: B Natriuretic Peptide: 501.4 pg/mL — ABNORMAL HIGH (ref 0.0–100.0)

## 2017-05-23 LAB — MAGNESIUM: Magnesium: 1.9 mg/dL (ref 1.7–2.4)

## 2017-05-23 LAB — POCT INR: INR: 1.4

## 2017-05-23 MED ORDER — METOPROLOL SUCCINATE ER 50 MG PO TB24: ORAL_TABLET | ORAL | 3 refills | Status: DC

## 2017-05-23 NOTE — Patient Instructions (Signed)
Stop Cartia  Stop Metoprolol Tartrate  Stop Aspirin  Start Toprol XL 75 mg (1.5 tabs) daily   Labs drawn today  You have been referred to A-Fib clinic  Your physician has requested that you have an echocardiogram. Echocardiography is a painless test that uses sound waves to create images of your heart. It provides your doctor with information about the size and shape of your heart and how well your heart's chambers and valves are working. This procedure takes approximately one hour. There are no restrictions for this procedure.   Your physician recommends that you schedule a follow-up appointment in: 1 month

## 2017-05-24 ENCOUNTER — Encounter (HOSPITAL_COMMUNITY): Payer: Self-pay | Admitting: Cardiology

## 2017-05-26 NOTE — Progress Notes (Signed)
PCP: Dr. [name] Cardiology: Dr. Angelena Form HF Cardiology: Dr. Aundra Dubin  83 yo with history of persistent atrial fibrillation, CAD, and chronic systolic CHF referred by Dr. Angelena Form for CHF clinic evaluation.  Patient was admitted in 2/16 with afib/RVR.  Echo showed EF 25-30% at that time.  He had LHC, showing 90% mLAD stenosis that was treated with DES. In 4/16, he was cardioverted back to NSR.  Repeat echo in NSR showed EF 45-50%.  He was then admitted in 1/17 and again in 3/17 with afib/RVR (recurrent).  He did not have repeat DCCV. Echo in 3/17 showed EF 40% with mildly decreased RV systolic function.  He has remained in atrial fibrillation.   He developed symptomatic CHF but this has been improved with Lasix use.  Currently, weight is holding steady around 190 lbs at home on Lasix 40 mg daily. He is not doing as much as in the past.  He notes dyspnea walking up 1 flight of steps or walking 1 block or so on flat ground.  No orthopnea/PND. No chest pain.  Occasional lightheadedness when he first stands up.    Labs (5/17): LDL 64, HDL 66 Labs (7/18): K 4.3, creatinine 1.42  ECG (personally reviewed): atrial fibrillation rate 60  PMH: 1. HTN 2. CKD stage 3 3. Hyperlipidemia 4. CAD:  - LHC 2/16 with 90% mLAD, 50% RCA => DES to mLAD.  5. 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  - Echo (2/16) with EF 25-30%, moderate MR while in afib.  - Echo (4/16) in NSR with EF 45-50%, no MR.  - Echo (3/17) in atrial fibrillation with EF 40%, mildly decreased RV systolic function.  6. Atrial fibrillation: Persistent.  - 2/16 admission for afib/RVR => DCCV in 4/16.  - 1/17 admission with afib/RVR - 3/17 admission with afib/RVR  Social History   Social History  . Marital status: Married    Spouse name: N/A  . Number of children: N/A  . Years of education: N/A   Occupational History  . Not on file.   Social History Main Topics  . Smoking status: Never Smoker   . Smokeless tobacco: Never Used  . Alcohol use No  . Drug use: No  . Sexual activity: Not on file   Other Topics Concern  . Not on file   Social History Narrative  . No narrative on file   Family History  Problem Relation Age of Onset  . Cancer Mother        breast  . Cancer Father        colon   ROS: All systems reviewed and negative except as per HPI.   Current Outpatient Prescriptions  Medication Sig Dispense Refill  . acetaminophen (TYLENOL) 500 MG tablet Take 500 mg by mouth every 4 (four) hours as needed for fever (pain).    . Ascorbic Acid (VITAMIN C) 1000 MG tablet Take 1,000 mg by mouth daily.    Marland Kitchen atorvastatin (LIPITOR) 40 MG tablet TAKE ONE TABLET BY MOUTH DAILY AT 6PM. 30 tablet 11  . DULoxetine (CYMBALTA) 30 MG capsule Take 1 capsule (30 mg total) by mouth daily. 30 capsule 5  . furosemide (LASIX) 40 MG tablet Take 1 tablet (40 mg total) by mouth daily. 30 tablet 0  . gabapentin (NEURONTIN) 300 MG capsule Take 1 capsule (300 mg total) by mouth 2 (two) times daily. 180 capsule 3  . lisinopril (PRINIVIL,ZESTRIL) 5 MG tablet TAKE ONE TABLET BY MOUTH ONCE DAILY 90 tablet  1  . nitroGLYCERIN (NITROSTAT) 0.4 MG SL tablet Place 1 tablet (0.4 mg total) under the tongue every 5 (five) minutes x 3 doses as needed for chest pain. 25 tablet 2  . potassium chloride SA (K-DUR,KLOR-CON) 10 MEQ tablet Take 1 tablet (10 mEq total) by mouth daily. 30 tablet 0  . warfarin (COUMADIN) 5 MG tablet TAKE BY MOUTH AS DIRECTED BY COUMADIN CLINIC 105 tablet 1  . metoprolol succinate (TOPROL-XL) 50 MG 24 hr tablet Take 75 mg (1.5 tabs), twice a day. Take with or immediately following a meal. 90 tablet 3   No current facility-administered medications for this encounter.    BP 134/72   Pulse 69   Wt 192 lb (87.1 kg)   SpO2 99%   BMI 28.35 kg/m  General: NAD Neck: No JVD, no thyromegaly or thyroid nodule.  Lungs: Clear to auscultation bilaterally with normal respiratory effort. CV:  Nondisplaced PMI.  Heart irregular S1/S2, no S3/S4, no murmur.  1+ edema right ankle, trace edema left ankle.  No carotid bruit.  Normal pedal pulses.  Abdomen: Soft, nontender, no hepatosplenomegaly, no distention.  Skin: Intact without lesions or rashes.  Neurologic: Alert and oriented x 3.  Psych: Normal affect. Extremities: No clubbing or cyanosis.  HEENT: Normal.   Assessment/Plan: 1. Chronic systolic CHF: Last echo in 3/17 with EF 40%.  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  EF has fallen when he has been in atrial fibrillation. Currently, NYHA class II-III symptoms.  He is not volume overloaded on exam.  - HR is well-controlled at this point.  I think that he should stop diltiazem CD with low EF.  - I am also going to have him stop metoprolol tartrate and started Toprol XL 75 mg bid given cardiomyopathy.  - He will continue lisinopril 5 mg daily.  - At next appointment, will work on getting him started on spironolactone.  - Repeat echo. - See below, suspect he would benefit from NSR.  2. Atrial fibrillation: Now persistent.  Had DCCV x 1 but did not hold NSR long-term. Has had afib/RVR in past, rate currently controlled.  He is clearly more symptomatic in terms of fatigue and dyspnea while in atrial fibrillation, and EF appears to track up and down depending on whether or not he is in atrial fibrillation.  I would like to see him in NSR, not sure he would hold long-term without an anti-arrhythmic.  - We discussed anti-arrhythmic options, which for him would be amiodarone or dofetilide.  ECG did not show prolonged QT interval, I think dofetilide would be reasonable.  I am going to refer him to atrial fibrillation clinic to arrange dofetilide admission.   - Continue warfarin, will need 1 month therapeutic prior to dofetilide admission (appears to have been therapeutic since 03/26/17, I will send INR today).  He can stop ASA given stable CAD and  warfarin use.  3. CKD: Stage 3.  BMET today.  4. CAD: DES to LAD in 2/16.  No chest pain.  - As above, think he can stop ASA given stable CAD and warfarin use.  - Continue atorvastatin 40 daily.   Followup in 1 month.   Loralie Champagne 05/26/2017

## 2017-05-27 ENCOUNTER — Other Ambulatory Visit: Payer: Self-pay | Admitting: Cardiovascular Disease

## 2017-05-30 ENCOUNTER — Inpatient Hospital Stay (HOSPITAL_COMMUNITY): Admission: RE | Admit: 2017-05-30 | Payer: Medicare Other | Source: Ambulatory Visit | Admitting: Nurse Practitioner

## 2017-06-05 ENCOUNTER — Encounter (HOSPITAL_COMMUNITY): Payer: Self-pay | Admitting: Nurse Practitioner

## 2017-06-05 ENCOUNTER — Ambulatory Visit (HOSPITAL_COMMUNITY)
Admission: RE | Admit: 2017-06-05 | Discharge: 2017-06-05 | Disposition: A | Discharge status: Home / Self Care | Payer: Medicare Other | Source: Ambulatory Visit | Attending: Nurse Practitioner | Admitting: Nurse Practitioner

## 2017-06-05 ENCOUNTER — Telehealth: Payer: Self-pay | Admitting: Pharmacist

## 2017-06-05 VITALS — BP 104/60 | HR 91 | Ht 69.0 in | Wt 191.8 lb

## 2017-06-05 DIAGNOSIS — Z7901 Long term (current) use of anticoagulants: Secondary | ICD-10-CM | POA: Diagnosis not present

## 2017-06-05 DIAGNOSIS — I481 Persistent atrial fibrillation: Secondary | ICD-10-CM | POA: Insufficient documentation

## 2017-06-05 DIAGNOSIS — E785 Hyperlipidemia, unspecified: Secondary | ICD-10-CM | POA: Insufficient documentation

## 2017-06-05 DIAGNOSIS — I5022 Chronic systolic (congestive) heart failure: Secondary | ICD-10-CM | POA: Diagnosis not present

## 2017-06-05 DIAGNOSIS — Z79899 Other long term (current) drug therapy: Secondary | ICD-10-CM | POA: Diagnosis not present

## 2017-06-05 DIAGNOSIS — I4891 Unspecified atrial fibrillation: Secondary | ICD-10-CM | POA: Diagnosis present

## 2017-06-05 DIAGNOSIS — Z800 Family history of malignant neoplasm of digestive organs: Secondary | ICD-10-CM | POA: Insufficient documentation

## 2017-06-05 DIAGNOSIS — I4819 Other persistent atrial fibrillation: Secondary | ICD-10-CM

## 2017-06-05 DIAGNOSIS — I110 Hypertensive heart disease with heart failure: Secondary | ICD-10-CM | POA: Insufficient documentation

## 2017-06-05 DIAGNOSIS — Z955 Presence of coronary angioplasty implant and graft: Secondary | ICD-10-CM | POA: Diagnosis not present

## 2017-06-05 DIAGNOSIS — Z803 Family history of malignant neoplasm of breast: Secondary | ICD-10-CM | POA: Diagnosis not present

## 2017-06-05 DIAGNOSIS — I2510 Atherosclerotic heart disease of native coronary artery without angina pectoris: Secondary | ICD-10-CM | POA: Insufficient documentation

## 2017-06-05 NOTE — Telephone Encounter (Signed)
Medication list reviewed in anticipation of upcoming Tikosyn initiation. Patient is not taking any contraindicated or QTc prolonging medications. Patient is anticoagulated on warfarin however INRs have been very labile (1.4 and 8.6 on 2 most recent checks). INR was subtherapeutic on his last check on 9/6 and did not show for his follow up check on 9/13 but is scheduled for INR check tomorrow 9/20. He will require 4 weekly therapeutic INRs prior to [place] admission.  K and Mg both acceptable, QTc was elevated today at 41msec and will need to be monitored closely.  Patient will need to be counseled to avoid use of Benadryl while on Tikosyn and in the 2-3 days prior to Tikosyn initiation.

## 2017-06-06 ENCOUNTER — Encounter: Payer: Self-pay | Admitting: Family Medicine

## 2017-06-06 ENCOUNTER — Ambulatory Visit (INDEPENDENT_AMBULATORY_CARE_PROVIDER_SITE_OTHER): Payer: Medicare Other | Admitting: *Deleted

## 2017-06-06 DIAGNOSIS — Z5181 Encounter for therapeutic drug level monitoring: Secondary | ICD-10-CM | POA: Diagnosis not present

## 2017-06-06 DIAGNOSIS — Z7901 Long term (current) use of anticoagulants: Secondary | ICD-10-CM | POA: Diagnosis not present

## 2017-06-06 DIAGNOSIS — I4891 Unspecified atrial fibrillation: Secondary | ICD-10-CM | POA: Diagnosis not present

## 2017-06-06 LAB — POCT INR: INR: 1.9

## 2017-06-06 NOTE — Progress Notes (Signed)
Primary Care Physician: Eulas Post, MD Referring Physician:Dr. Nazaire Cordial is a 83 y.o. male with a h/o persistent since 2016, CAD and chronic systolic CHF, that is in the afib clinic at the request of Dr. Aundra Dubin to be considered for tikosyn. Echo  in 3/17 showed EF 40% with mildly decreased RV systolic function.  He has remained in atrial fibrillation.   He developed symptomatic CHF but this has been improved with Lasix use.  Currently, weight is holding stead  on Lasix 40 mg daily. He is not doing as much as in the past.  He notes dyspnea walking up 1 flight of steps or walking 1 block or so on flat ground.  No orthopnea/PND. No chest pain.  Occasional lightheadedness when he first stands up.    Today, he denies symptoms of palpitations, chest pain, shortness of breath, orthopnea, PND, lower extremity edema, dizziness, presyncope, syncope, or neurologic sequela. The patient is tolerating medications without difficulties and is otherwise without complaint today.   Past Medical History:  Diagnosis Date  . Allergy    hay fever, allergies  . CAD (coronary artery disease)    DES to mid LAD 11/10/2014  . Chronic systolic heart failure ([place])   . History of echocardiogram    Echo 3/17: EF 40%, diff HK, trivial AI, trivial MR, mod LAE, mild reduced RVSF, mod RAE  . Hyperlipidemia   . Hypertension   . Persistent atrial fibrillation [facility])    Past Surgical History:  Procedure Laterality Date  . APPENDECTOMY  1947  . CARDIOVERSION N/A 12/30/2014   Procedure: CARDIOVERSION;  Surgeon: Jerline Pain, MD;  Location: [place];  Service: Cardiovascular;  Laterality: N/A;  . LEFT HEART CATHETERIZATION WITH CORONARY ANGIOGRAM N/A 11/10/2014   Procedure: LEFT HEART CATHETERIZATION WITH CORONARY ANGIOGRAM;  Surgeon: Wellington Hampshire, MD;  Location: [facility] CATH LAB;  Service: Cardiovascular;  Laterality: N/A;  . PERCUTANEOUS CORONARY STENT INTERVENTION (PCI-S)  11/10/2014   Procedure: PERCUTANEOUS CORONARY STENT INTERVENTION (PCI-S);  Surgeon: Wellington Hampshire, MD;  Location: [facility] CATH LAB;  Service: Cardiovascular;;    Current Outpatient Prescriptions  Medication Sig Dispense Refill  . acetaminophen (TYLENOL) 500 MG tablet Take 500 mg by mouth every 4 (four) hours as needed for fever (pain).    . Ascorbic Acid (VITAMIN C) 1000 MG tablet Take 1,000 mg by mouth daily.    Marland Kitchen atorvastatin (LIPITOR) 40 MG tablet TAKE ONE TABLET BY MOUTH DAILY AT 6PM. 30 tablet 11  . furosemide (LASIX) 40 MG tablet Take 1 tablet (40 mg total) by mouth daily. 30 tablet 0  . gabapentin (NEURONTIN) 300 MG capsule Take 1 capsule (300 mg total) by mouth 2 (two) times daily. (Patient taking differently: Take 300 mg by mouth at bedtime. ) 180 capsule 3  . lisinopril (PRINIVIL,ZESTRIL) 5 MG tablet TAKE ONE TABLET BY MOUTH ONCE DAILY 90 tablet 1  . metoprolol succinate (TOPROL-XL) 50 MG 24 hr tablet Take 75 mg (1.5 tabs), twice a day. Take with or immediately following a meal. 90 tablet 3  . nitroGLYCERIN (NITROSTAT) 0.4 MG SL tablet Place 1 tablet (0.4 mg total) under the tongue every 5 (five) minutes x 3 doses as needed for chest pain. 25 tablet 2  . potassium chloride SA (K-DUR,KLOR-CON) 10 MEQ tablet Take 1 tablet (10 mEq total) by mouth daily. 30 tablet 0  . warfarin (COUMADIN) 5 MG tablet TAKE BY MOUTH AS DIRECTED BY COUMADIN CLINIC 105 tablet 1  No current facility-administered medications for this encounter.     No Known Allergies  Social History   Social History  . Marital status: Married    Spouse name: N/A  . Number of children: N/A  . Years of education: N/A   Occupational History  . Not on file.   Social History Main Topics  . Smoking status: Never Smoker  . Smokeless tobacco: Never Used  . Alcohol use No  . Drug use: No  . Sexual activity: Not on file   Other Topics Concern  . Not on file   Social History Narrative  . No narrative on file    Family History    Problem Relation Age of Onset  . Cancer Mother        breast  . Cancer Father        colon    ROS- All systems are reviewed and negative except as per the HPI above  Physical Exam: Vitals:   06/05/17 1425  BP: 104/60  Pulse: 91  Weight: 191 lb 12.8 oz (87 kg)  Height: 5\' 9"  (1.753 m)   Wt Readings from Last 3 Encounters:  06/05/17 191 lb 12.8 oz (87 kg)  05/23/17 192 lb (87.1 kg)  04/25/17 193 lb 6.4 oz (87.7 kg)    Labs: Lab Results  Component Value Date   NA 137 05/23/2017   K 4.5 05/23/2017   CL 103 05/23/2017   CO2 26 05/23/2017   GLUCOSE 93 05/23/2017   BUN 14 05/23/2017   CREATININE 1.46 (H) 05/23/2017   CALCIUM 8.8 (L) 05/23/2017   MG 1.9 05/23/2017   Lab Results  Component Value Date   INR 1.4 05/23/2017   Lab Results  Component Value Date   CHOL 150 02/15/2016   HDL 66 02/15/2016   LDLCALC 64 02/15/2016   TRIG 98 02/15/2016     GEN- The patient is well appearing, alert and oriented x 3 today.   Head- normocephalic, atraumatic Eyes-  Sclera clear, conjunctiva pink Ears- hearing intact Oropharynx- clear Neck- supple, no JVP Lymph- no cervical lymphadenopathy Lungs- Clear to ausculation bilaterally, normal work of breathing Heart- irregular rate and rhythm, no murmurs, rubs or gallops, PMI not laterally displaced GI- soft, NT, ND, + BS Extremities- no clubbing, cyanosis, or edema MS- no significant deformity or atrophy Skin- no rash or lesion Psych- euthymic mood, full affect Neuro- strength and sensation are intact  EKG-atrial fibrillation at 91 bpm, qrs int 72 ms, qtc 484 ms    Assessment and Plan: 1. Symptomatic persistent afib, consideration for tikosyn Pt does not have Medicare D coverage so that means he would have to pay $700 out pocket a month for generic dofetilide If he meets the pt assistance guidelines, he may be able to get the drug for free, but the pt thinks his income is very close to the cut off , he will check with his  account I am also considered re his qtc length as most are between 460-490 ms, I would have to get EP MD ok for drug to be used    His renal function will only allow for the dose of 250 mcg bid of dofetilide to be used Drugs screened by PharmD and no qtc prolonging drugs on board He will have to have 4 weekly therapeutic INR's and coumadin clinic notified of potential plans Sotalol would be a cheaper option but  is contraindicated when qtc is over 450 ms, plus renal function will only allow for  80 mg qd dosing.  Will await for pt's response to see if he can even afford drug and will go from there.  Geroge Baseman Dshawn Mcnay, [facility] [address] [place], [place] [address] [phone]

## 2017-06-07 ENCOUNTER — Other Ambulatory Visit: Payer: Self-pay | Admitting: Cardiovascular Disease

## 2017-06-07 ENCOUNTER — Ambulatory Visit: Payer: Medicare Other | Admitting: Family Medicine

## 2017-06-14 ENCOUNTER — Encounter: Payer: Self-pay | Admitting: Family Medicine

## 2017-06-14 ENCOUNTER — Ambulatory Visit (INDEPENDENT_AMBULATORY_CARE_PROVIDER_SITE_OTHER): Payer: Medicare Other | Admitting: Family Medicine

## 2017-06-14 VITALS — BP 108/70 | HR 76 | Temp 98.2°F | Wt 189.1 lb

## 2017-06-14 DIAGNOSIS — B0229 Other postherpetic nervous system involvement: Secondary | ICD-10-CM

## 2017-06-14 DIAGNOSIS — Z23 Encounter for immunization: Secondary | ICD-10-CM | POA: Diagnosis not present

## 2017-06-14 DIAGNOSIS — I255 Ischemic cardiomyopathy: Secondary | ICD-10-CM

## 2017-06-14 NOTE — Progress Notes (Signed)
Subjective:     Patient ID: Ricardo Valencia, male   DOB: 12/07/1928, 83 y.o.   MRN: [id number]  HPI Patient seen for follow-up regarding postherpetic neuralgia. He is currently on gabapentin 300 mg twice daily. He was having some breakthrough pain during the day and we had prescribed Cymbalta and apparently took this for couple weeks but felt this made him more "fatigued". Not sure he gave this an adequate trial. Nevertheless, he does not wish to do any additional medications at time. His wife states that he does seem to have some complaints of pain middle the day but seems be sleeping fairly well. He had some edema last visit and this is improved and weight is down 4 pounds.  History of atrial fibrillation followed by cardiology. They're looking at possible cardioversion  Past Medical History:  Diagnosis Date  . Allergy    hay fever, allergies  . CAD (coronary artery disease)    DES to mid LAD 11/10/2014  . Chronic systolic heart failure ([place])   . History of echocardiogram    Echo 3/17: EF 40%, diff HK, trivial AI, trivial MR, mod LAE, mild reduced RVSF, mod RAE  . Hyperlipidemia   . Hypertension   . Persistent atrial fibrillation [facility])    Past Surgical History:  Procedure Laterality Date  . APPENDECTOMY  1947  . CARDIOVERSION N/A 12/30/2014   Procedure: CARDIOVERSION;  Surgeon: Jerline Pain, MD;  Location: [place];  Service: Cardiovascular;  Laterality: N/A;  . LEFT HEART CATHETERIZATION WITH CORONARY ANGIOGRAM N/A 11/10/2014   Procedure: LEFT HEART CATHETERIZATION WITH CORONARY ANGIOGRAM;  Surgeon: Wellington Hampshire, MD;  Location: [place] CATH LAB;  Service: Cardiovascular;  Laterality: N/A;  . PERCUTANEOUS CORONARY STENT INTERVENTION (PCI-S)  11/10/2014   Procedure: PERCUTANEOUS CORONARY STENT INTERVENTION (PCI-S);  Surgeon: Wellington Hampshire, MD;  Location: [facility] CATH LAB;  Service: Cardiovascular;;    reports that he has never smoked. He has never used smokeless tobacco. He reports  that he does not drink alcohol or use drugs. family history includes Cancer in his father and mother. No Known Allergies   Review of Systems  Constitutional: Negative for fatigue.  Eyes: Negative for visual disturbance.  Respiratory: Negative for cough, chest tightness and shortness of breath.   Cardiovascular: Negative for chest pain, palpitations and leg swelling.  Neurological: Negative for dizziness, syncope, weakness, light-headedness and headaches.       Objective:   Physical Exam  Constitutional: He is oriented to person, place, and time. He appears well-developed and well-nourished.  HENT:  Right Ear: External ear normal.  Left Ear: External ear normal.  Mouth/Throat: Oropharynx is clear and moist.  Eyes: Pupils are equal, round, and reactive to light.  Neck: Neck supple. No thyromegaly present.  Cardiovascular: Normal rate and regular rhythm.   Pulmonary/Chest: Effort normal and breath sounds normal. No respiratory distress. He has no wheezes. He has no rales.  Musculoskeletal: He exhibits no edema.  Neurological: He is alert and oriented to person, place, and time.       Assessment:     Postherpetic neuralgia stable    Plan:     -we gave him option of adding third dose of gabapentin and at this point he wishes to wait.   -flu vaccine given.  Eulas Post MD [facility] at [facility]

## 2017-06-18 ENCOUNTER — Telehealth (HOSPITAL_COMMUNITY): Payer: Self-pay | Admitting: *Deleted

## 2017-06-18 NOTE — Telephone Encounter (Signed)
Left message for patient to return my call to formulate plan for treatment of afib. Pt after our office visit was checking on affordability of tikosyn since no medication insurance. Will await call back from pt.

## 2017-06-20 ENCOUNTER — Ambulatory Visit (INDEPENDENT_AMBULATORY_CARE_PROVIDER_SITE_OTHER): Payer: Medicare Other | Admitting: Pharmacist

## 2017-06-20 DIAGNOSIS — Z7901 Long term (current) use of anticoagulants: Secondary | ICD-10-CM | POA: Diagnosis not present

## 2017-06-20 DIAGNOSIS — I4891 Unspecified atrial fibrillation: Secondary | ICD-10-CM

## 2017-06-20 DIAGNOSIS — Z5181 Encounter for therapeutic drug level monitoring: Secondary | ICD-10-CM | POA: Diagnosis not present

## 2017-06-20 LAB — POCT INR: INR: 3.5

## 2017-06-26 NOTE — Telephone Encounter (Signed)
Wife called back stating they would like to proceed with tikosyn -- understand this is dependent on 4 weekly therapeutic INRs. Scheduled for admit 10/30.

## 2017-06-27 ENCOUNTER — Encounter (HOSPITAL_COMMUNITY): Payer: Self-pay | Admitting: Cardiology

## 2017-06-27 ENCOUNTER — Ambulatory Visit (INDEPENDENT_AMBULATORY_CARE_PROVIDER_SITE_OTHER): Payer: Medicare Other

## 2017-06-27 ENCOUNTER — Ambulatory Visit (HOSPITAL_BASED_OUTPATIENT_CLINIC_OR_DEPARTMENT_OTHER)
Admission: RE | Admit: 2017-06-27 | Discharge: 2017-06-27 | Disposition: A | Discharge status: Home / Self Care | Payer: Medicare Other | Source: Ambulatory Visit | Attending: Cardiology | Admitting: Cardiology

## 2017-06-27 ENCOUNTER — Ambulatory Visit (HOSPITAL_COMMUNITY)
Admission: RE | Admit: 2017-06-27 | Discharge: 2017-06-27 | Disposition: A | Discharge status: Home / Self Care | Payer: Medicare Other | Source: Ambulatory Visit | Attending: Family Medicine | Admitting: Family Medicine

## 2017-06-27 VITALS — BP 127/86 | HR 63 | Wt 196.0 lb

## 2017-06-27 DIAGNOSIS — R29898 Other symptoms and signs involving the musculoskeletal system: Secondary | ICD-10-CM | POA: Diagnosis not present

## 2017-06-27 DIAGNOSIS — I4891 Unspecified atrial fibrillation: Secondary | ICD-10-CM | POA: Diagnosis not present

## 2017-06-27 DIAGNOSIS — Z5181 Encounter for therapeutic drug level monitoring: Secondary | ICD-10-CM | POA: Diagnosis not present

## 2017-06-27 DIAGNOSIS — I2510 Atherosclerotic heart disease of native coronary artery without angina pectoris: Secondary | ICD-10-CM | POA: Insufficient documentation

## 2017-06-27 DIAGNOSIS — E785 Hyperlipidemia, unspecified: Secondary | ICD-10-CM | POA: Diagnosis not present

## 2017-06-27 DIAGNOSIS — I5022 Chronic systolic (congestive) heart failure: Secondary | ICD-10-CM

## 2017-06-27 DIAGNOSIS — I080 Rheumatic disorders of both mitral and aortic valves: Secondary | ICD-10-CM | POA: Diagnosis not present

## 2017-06-27 DIAGNOSIS — I110 Hypertensive heart disease with heart failure: Secondary | ICD-10-CM | POA: Insufficient documentation

## 2017-06-27 DIAGNOSIS — I480 Paroxysmal atrial fibrillation: Secondary | ICD-10-CM | POA: Diagnosis not present

## 2017-06-27 DIAGNOSIS — Z7901 Long term (current) use of anticoagulants: Secondary | ICD-10-CM

## 2017-06-27 LAB — PROTIME-INR
INR: 2.1
PROTHROMBIN TIME: 23.4 s — AB (ref 11.4–15.2)

## 2017-06-27 LAB — POCT INR: INR: 2.2

## 2017-06-27 NOTE — Progress Notes (Signed)
  Echocardiogram 2D Echocardiogram has been performed.  Darlina Sicilian M 06/27/2017, 11:49 AM

## 2017-06-27 NOTE — Patient Instructions (Addendum)
Call and make an 2 week appointment at the A-Fib Clinic [phone]  Labs drawn today (if we do not call you, then your lab work was stable)   Your physician recommends that you schedule a follow-up appointment in: 2 months

## 2017-06-27 NOTE — Progress Notes (Signed)
PCP: Dr. [name] Cardiology: Dr. Angelena Form HF Cardiology: Dr. Aundra Dubin  83 yo with history of persistent atrial fibrillation, CAD, and chronic systolic CHF returns for followup of CHF and atrial fibrillation.  Patient was admitted in 2/16 with afib/RVR.  Echo showed EF 25-30% at that time.  He had LHC, showing 90% mLAD stenosis that was treated with DES. In 4/16, he was cardioverted back to NSR.  Repeat echo in NSR showed EF 45-50%.  He was then admitted in 1/17 and again in 3/17 with afib/RVR (recurrent).  He did not have repeat DCCV. Echo in 3/17 showed EF 40% with mildly decreased RV systolic function.  He has remained in atrial fibrillation.   Echo was done today, EF 55-60% with basal inferior akinesis, mildly dilated RV with mildly decreased RV systolic function.    Since last appointment with me, he was seen in the atrial fibrillation clinic to consider Tikosyn.  There were some issues about cost, but he tells me that these have been worked out and that he is willing to be admitted for [place].  He is doing fairly well.  4-5 days ago, he had an episode of chest pain while in bed, it lasted for about an hour then resolved.  He has had no further chest pain and has had no chest pain with exertion.  Currently, no dyspnea with exertion.  No orthopnea/PND.  Wife says that he snores at night and gasps.    Labs (5/17): LDL 64, HDL 66 Labs (7/18): K 4.3, creatinine 1.42 Labs (9/18): K 4.5, creatinine 1.46, BNP 501  ECG (personally reviewed): atrial fibrillation, PVCs, septal Qs, QTc hard to determine (afib and low voltage T wave).    PMH: 1. HTN 2. CKD stage 3 3. Hyperlipidemia 4. CAD:  - LHC 2/16 with 90% mLAD, 50% RCA => DES to mLAD.  5. 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  - Echo (2/16) with EF 25-30%, moderate MR while in afib.  - Echo (4/16) in NSR with EF 45-50%, no MR.  - Echo (3/17) in atrial fibrillation with EF 40%, mildly decreased RV  systolic function.  - Echo (10/18): EF 55-60%, basal inferior akinesis, mild LVH, mildly dilated RV with mildly decreased systolic function, mild MR. 6. Atrial fibrillation: Persistent.  - 2/16 admission for afib/RVR => DCCV in 4/16.  - 1/17 admission with afib/RVR - 3/17 admission with afib/RVR  Social History   Social History  . Marital status: Married    Spouse name: N/A  . Number of children: N/A  . Years of education: N/A   Occupational History  . Not on file.   Social History Main Topics  . Smoking status: Never Smoker  . Smokeless tobacco: Never Used  . Alcohol use No  . Drug use: No  . Sexual activity: Not on file   Other Topics Concern  . Not on file   Social History Narrative  . No narrative on file   Family History  Problem Relation Age of Onset  . Cancer Mother        breast  . Cancer Father        colon   ROS: All systems reviewed and negative except as per HPI.   Current Outpatient Prescriptions  Medication Sig Dispense Refill  . acetaminophen (TYLENOL) 500 MG tablet Take 500 mg by mouth every 4 (four) hours as needed for fever (pain).    . Ascorbic Acid (VITAMIN C) 1000 MG tablet Take 1,000 mg by  mouth daily.    Marland Kitchen atorvastatin (LIPITOR) 40 MG tablet TAKE ONE TABLET BY MOUTH DAILY AT 6PM. 30 tablet 11  . furosemide (LASIX) 40 MG tablet Take 1 tablet (40 mg total) by mouth daily. 30 tablet 0  . gabapentin (NEURONTIN) 300 MG capsule Take 1 capsule (300 mg total) by mouth 2 (two) times daily. (Patient taking differently: Take 300 mg by mouth at bedtime. ) 180 capsule 3  . lisinopril (PRINIVIL,ZESTRIL) 5 MG tablet TAKE 1 TABLET BY MOUTH ONCE DAILY 90 tablet 3  . metoprolol succinate (TOPROL-XL) 50 MG 24 hr tablet Take 75 mg (1.5 tabs), twice a day. Take with or immediately following a meal. 90 tablet 3  . nitroGLYCERIN (NITROSTAT) 0.4 MG SL tablet Place 1 tablet (0.4 mg total) under the tongue every 5 (five) minutes x 3 doses as needed for chest pain. 25  tablet 2  . potassium chloride SA (K-DUR,KLOR-CON) 10 MEQ tablet Take 1 tablet (10 mEq total) by mouth daily. 30 tablet 0  . warfarin (COUMADIN) 5 MG tablet TAKE BY MOUTH AS DIRECTED BY COUMADIN CLINIC 105 tablet 1   No current facility-administered medications for this encounter.    BP 127/86 (BP Location: Left Arm, Patient Position: Sitting, Cuff Size: Normal)   Pulse 63   Wt 196 lb (88.9 kg)   SpO2 98%   BMI 28.94 kg/m  General: NAD Neck: No JVD, no thyromegaly or thyroid nodule.  Lungs: Clear to auscultation bilaterally with normal respiratory effort. CV: Nondisplaced PMI.  Heart irregular S1/S2, no S3/S4, no murmur.  No peripheral edema.  No carotid bruit.  Normal pedal pulses.  Abdomen: Soft, nontender, no hepatosplenomegaly, no distention.  Skin: Intact without lesions or rashes.  Neurologic: Alert and oriented x 3.  Psych: Normal affect. Extremities: No clubbing or cyanosis.  HEENT: Normal.   Assessment/Plan: 1. 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  EF has fallen when he has been in atrial fibrillation. However, echo today was reviewed and shows EF up to 55-60%.  Currently, NYHA class II symptoms.  He is not volume overloaded on exam.  - HR is well-controlled at this point and may have led to improvement in EF.  - Continue Toprol XL 75 mg bid.   - He will continue lisinopril 5 mg daily.  - See below, suspect he would benefit from NSR.  2. Atrial fibrillation: Now persistent.  Had DCCV x 1 but did not hold NSR long-term. Has had afib/RVR in past, rate currently controlled.  He generally is more symptomatic in terms of fatigue and dyspnea while in atrial fibrillation, and EF appears to track up and down depending on whether or not he is in rapid atrial fibrillation.  EF is better with rate control today.  I would like to see him in NSR, not sure he would hold long-term without an anti-arrhythmic.  - We discussed  anti-arrhythmic options, which for him would be amiodarone or dofetilide.  I think dofetilide would be reasonable though he would have to start with 250 mcg bid given renal dysfunction.  He tells me that it looks like dofetilide would work out financially.  Therefore, I will refer him back to afib clinic to set up dofetilide admission. .   - Continue warfarin, will need 1 month therapeutic prior to dofetilide admission (appears to have been therapeutic since 06/20/17, I will send INR today).   3. CKD: Stage 3.   4. CAD: DES to LAD in 2/16.  No chest  pain.  - No ASA given stable CAD and warfarin use.  - Continue atorvastatin 40 daily.  5. Suspect OSA: Based on wife's description of his sleep pattern, I suspect he has OSA.  This may help drive atrial fibrillation.  He is not interested in a sleep study right now but will think about it.   Followup in 2 months.    Loralie Champagne 06/27/2017

## 2017-06-29 ENCOUNTER — Other Ambulatory Visit: Payer: Self-pay | Admitting: Cardiovascular Disease

## 2017-07-04 ENCOUNTER — Ambulatory Visit (INDEPENDENT_AMBULATORY_CARE_PROVIDER_SITE_OTHER): Payer: Medicare Other | Admitting: *Deleted

## 2017-07-04 DIAGNOSIS — I4891 Unspecified atrial fibrillation: Secondary | ICD-10-CM | POA: Diagnosis not present

## 2017-07-04 DIAGNOSIS — Z5181 Encounter for therapeutic drug level monitoring: Secondary | ICD-10-CM | POA: Diagnosis not present

## 2017-07-04 DIAGNOSIS — Z7901 Long term (current) use of anticoagulants: Secondary | ICD-10-CM

## 2017-07-04 LAB — POCT INR: INR: 2.7

## 2017-07-11 ENCOUNTER — Ambulatory Visit (INDEPENDENT_AMBULATORY_CARE_PROVIDER_SITE_OTHER): Payer: Medicare Other | Admitting: *Deleted

## 2017-07-11 DIAGNOSIS — Z5181 Encounter for therapeutic drug level monitoring: Secondary | ICD-10-CM | POA: Diagnosis not present

## 2017-07-11 DIAGNOSIS — I4891 Unspecified atrial fibrillation: Secondary | ICD-10-CM | POA: Diagnosis not present

## 2017-07-11 DIAGNOSIS — Z7901 Long term (current) use of anticoagulants: Secondary | ICD-10-CM

## 2017-07-11 LAB — POCT INR: INR: 2.7

## 2017-07-16 ENCOUNTER — Encounter (HOSPITAL_COMMUNITY): Payer: Self-pay | Admitting: General Practice

## 2017-07-16 ENCOUNTER — Inpatient Hospital Stay (HOSPITAL_COMMUNITY)
Admission: RE | Admit: 2017-07-16 | Discharge: 2017-07-19 | DRG: 309 | Disposition: A | Discharge status: Home / Self Care | Payer: Medicare Other | Source: Ambulatory Visit | Attending: Cardiology | Admitting: Cardiology

## 2017-07-16 ENCOUNTER — Ambulatory Visit (INDEPENDENT_AMBULATORY_CARE_PROVIDER_SITE_OTHER): Payer: Medicare Other | Admitting: Pharmacist

## 2017-07-16 ENCOUNTER — Ambulatory Visit (HOSPITAL_COMMUNITY)
Admission: RE | Admit: 2017-07-16 | Discharge: 2017-07-16 | Disposition: A | Discharge status: Home / Self Care | Payer: Medicare Other | Source: Ambulatory Visit | Attending: Nurse Practitioner | Admitting: Nurse Practitioner

## 2017-07-16 ENCOUNTER — Encounter (HOSPITAL_COMMUNITY): Payer: Self-pay | Admitting: Nurse Practitioner

## 2017-07-16 ENCOUNTER — Other Ambulatory Visit: Payer: Self-pay

## 2017-07-16 VITALS — BP 118/62 | HR 75 | Ht 69.0 in | Wt 190.6 lb

## 2017-07-16 DIAGNOSIS — Z955 Presence of coronary angioplasty implant and graft: Secondary | ICD-10-CM

## 2017-07-16 DIAGNOSIS — I4581 Long QT syndrome: Secondary | ICD-10-CM | POA: Diagnosis present

## 2017-07-16 DIAGNOSIS — I4891 Unspecified atrial fibrillation: Secondary | ICD-10-CM | POA: Diagnosis not present

## 2017-07-16 DIAGNOSIS — I2510 Atherosclerotic heart disease of native coronary artery without angina pectoris: Secondary | ICD-10-CM | POA: Diagnosis present

## 2017-07-16 DIAGNOSIS — Z79899 Other long term (current) drug therapy: Secondary | ICD-10-CM

## 2017-07-16 DIAGNOSIS — I5022 Chronic systolic (congestive) heart failure: Secondary | ICD-10-CM | POA: Diagnosis present

## 2017-07-16 DIAGNOSIS — Z5181 Encounter for therapeutic drug level monitoring: Secondary | ICD-10-CM

## 2017-07-16 DIAGNOSIS — Z7901 Long term (current) use of anticoagulants: Secondary | ICD-10-CM

## 2017-07-16 DIAGNOSIS — I481 Persistent atrial fibrillation: Principal | ICD-10-CM

## 2017-07-16 DIAGNOSIS — E785 Hyperlipidemia, unspecified: Secondary | ICD-10-CM | POA: Diagnosis present

## 2017-07-16 DIAGNOSIS — I4819 Other persistent atrial fibrillation: Secondary | ICD-10-CM

## 2017-07-16 DIAGNOSIS — N183 Chronic kidney disease, stage 3 (moderate): Secondary | ICD-10-CM | POA: Diagnosis present

## 2017-07-16 DIAGNOSIS — I130 Hypertensive heart and chronic kidney disease with heart failure and stage 1 through stage 4 chronic kidney disease, or unspecified chronic kidney disease: Secondary | ICD-10-CM | POA: Diagnosis present

## 2017-07-16 HISTORY — DX: Allergic rhinitis due to pollen: J30.1

## 2017-07-16 LAB — BASIC METABOLIC PANEL
Anion gap: 8 (ref 5–15)
BUN: 12 mg/dL (ref 6–20)
CHLORIDE: 101 mmol/L (ref 101–111)
CO2: 30 mmol/L (ref 22–32)
CREATININE: 1.63 mg/dL — AB (ref 0.61–1.24)
Calcium: 8.5 mg/dL — ABNORMAL LOW (ref 8.9–10.3)
GFR calc non Af Amer: 37 mL/min — ABNORMAL LOW (ref >60)
GFR, EST AFRICAN AMERICAN: 43 mL/min — AB (ref >60)
Glucose, Bld: 89 mg/dL (ref 65–99)
POTASSIUM: 4 mmol/L (ref 3.5–5.1)
SODIUM: 139 mmol/L (ref 135–145)

## 2017-07-16 LAB — MAGNESIUM: Magnesium: 1.8 mg/dL (ref 1.7–2.4)

## 2017-07-16 LAB — POCT INR: INR: 3.7

## 2017-07-16 MED ORDER — POTASSIUM CHLORIDE CRYS ER 10 MEQ PO TBCR
10.0000 meq | EXTENDED_RELEASE_TABLET | Freq: Every day | ORAL | Status: DC
  Administered 2017-07-16 – 2017-07-19 (×4): 10 meq via ORAL
  Filled 2017-07-16 (×4): qty 1

## 2017-07-16 MED ORDER — NITROGLYCERIN 0.4 MG SL SUBL: 0.4000 mg | SUBLINGUAL_TABLET | SUBLINGUAL | Status: DC | PRN

## 2017-07-16 MED ORDER — WARFARIN SODIUM 2.5 MG PO TABS
2.5000 mg | ORAL_TABLET | Freq: Once | ORAL | Status: AC
  Administered 2017-07-16: 2.5 mg via ORAL
  Filled 2017-07-16: qty 1

## 2017-07-16 MED ORDER — WARFARIN - PHARMACIST DOSING INPATIENT
Freq: Every day | Status: DC
  Administered 2017-07-16 – 2017-07-18 (×3)

## 2017-07-16 MED ORDER — GABAPENTIN 300 MG PO CAPS
300.0000 mg | ORAL_CAPSULE | Freq: Every day | ORAL | Status: DC
  Administered 2017-07-16 – 2017-07-18 (×3): 300 mg via ORAL
  Filled 2017-07-16 (×3): qty 1

## 2017-07-16 MED ORDER — FUROSEMIDE 40 MG PO TABS
40.0000 mg | ORAL_TABLET | Freq: Every day | ORAL | Status: DC
  Administered 2017-07-17 – 2017-07-19 (×3): 40 mg via ORAL
  Filled 2017-07-16 (×3): qty 1

## 2017-07-16 MED ORDER — SODIUM CHLORIDE 0.9% FLUSH
3.0000 mL | Freq: Two times a day (BID) | INTRAVENOUS | Status: DC
  Administered 2017-07-16 – 2017-07-19 (×4): 3 mL via INTRAVENOUS

## 2017-07-16 MED ORDER — SODIUM CHLORIDE 0.9% FLUSH: 3.0000 mL | INTRAVENOUS | Status: DC | PRN

## 2017-07-16 MED ORDER — SODIUM CHLORIDE 0.9 % IV SOLN: 250.0000 mL | INTRAVENOUS | Status: DC | PRN

## 2017-07-16 MED ORDER — DOFETILIDE 250 MCG PO CAPS
250.0000 ug | ORAL_CAPSULE | Freq: Two times a day (BID) | ORAL | Status: DC
  Administered 2017-07-16: 250 ug via ORAL
  Filled 2017-07-16: qty 1

## 2017-07-16 MED ORDER — METOPROLOL SUCCINATE ER 50 MG PO TB24
75.0000 mg | ORAL_TABLET | Freq: Two times a day (BID) | ORAL | Status: DC
  Administered 2017-07-16 – 2017-07-19 (×6): 75 mg via ORAL
  Filled 2017-07-16 (×6): qty 1

## 2017-07-16 MED ORDER — MAGNESIUM SULFATE IN D5W 1-5 GM/100ML-% IV SOLN
1.0000 g | Freq: Once | INTRAVENOUS | Status: AC
  Administered 2017-07-16: 1 g via INTRAVENOUS
  Filled 2017-07-16: qty 100

## 2017-07-16 MED ORDER — VITAMIN C 500 MG PO TABS
1000.0000 mg | ORAL_TABLET | Freq: Every day | ORAL | Status: DC
  Administered 2017-07-17 – 2017-07-19 (×3): 1000 mg via ORAL
  Filled 2017-07-16 (×3): qty 2

## 2017-07-16 MED ORDER — ATORVASTATIN CALCIUM 40 MG PO TABS
40.0000 mg | ORAL_TABLET | Freq: Every day | ORAL | Status: DC
  Administered 2017-07-16 – 2017-07-18 (×3): 40 mg via ORAL
  Filled 2017-07-16 (×3): qty 1

## 2017-07-16 NOTE — H&P (Deleted)
Sherran Needs, NP Nurse Practitioner Signed Electrophysiology  Progress Notes Date of Service: 07/16/2017 10:30 AM  Related encounter: ATRIAL FIB OFFICE VISIT from 07/16/2017 in [place] [facility] All   [] Hide copied text   Primary Care Physician: Eulas Post, MD Referring Physician:Dr. Taaj Hurlbut is a 83 y.o. male with a h/o persistent since 2016, CAD and chronic systolic CHF, that is in the afib clinic at the request of Dr. Aundra Dubin to be considered for tikosyn. Echo  in 3/17 showed EF 40% with mildly decreased RV systolic function. He has remained in atrial fibrillation since that time. Echo 2018 showed improvement to EF of 55 to 60%, possibly improved with better rate control. He has had 4 therapeutic INR's.  He developed symptomatic CHF but this has been improved with Lasix use. Of note, he is supposed to be taking lasix daily but his wife realized today that she has been giving this bid for the last week. He notes dyspnea walking up 1 flight of steps or walking 1 block or so on flat ground. No orthopnea/PND. No chest pain. Occasional lightheadedness when he first stands up.   Qtc is borderline and Rye Dorado have to be monitored closely. PharmD did not identify any qtc prolonging drugs. Pt does state that he drinks alcohol most nights,drank 2 shots last pm, but feels that he Izaah Westman be ok in the hospital for 3 nights without withdrawal symptoms.  Today, he denies symptoms of palpitations, chest pain, shortness of breath, orthopnea, PND, lower extremity edema, dizziness, presyncope, syncope, or neurologic sequela. The patient is tolerating medications without difficulties and is otherwise without complaint today.       Past Medical History:  Diagnosis Date  . Allergy    hay fever, allergies  . CAD (coronary artery disease)    DES to mid LAD 11/10/2014  . Chronic systolic heart failure ([place])   . History of  echocardiogram    Echo 3/17: EF 40%, diff HK, trivial AI, trivial MR, mod LAE, mild reduced RVSF, mod RAE  . Hyperlipidemia   . Hypertension   . Persistent atrial fibrillation [facility])         Past Surgical History:  Procedure Laterality Date  . APPENDECTOMY  1947  . CARDIOVERSION N/A 12/30/2014   Procedure: CARDIOVERSION;  Surgeon: Jerline Pain, MD;  Location: [place];  Service: Cardiovascular;  Laterality: N/A;  . LEFT HEART CATHETERIZATION WITH CORONARY ANGIOGRAM N/A 11/10/2014   Procedure: LEFT HEART CATHETERIZATION WITH CORONARY ANGIOGRAM;  Surgeon: Wellington Hampshire, MD;  Location: [place] CATH LAB;  Service: Cardiovascular;  Laterality: N/A;  . PERCUTANEOUS CORONARY STENT INTERVENTION (PCI-S)  11/10/2014   Procedure: PERCUTANEOUS CORONARY STENT INTERVENTION (PCI-S);  Surgeon: Wellington Hampshire, MD;  Location: [facility] CATH LAB;  Service: Cardiovascular;;          Current Outpatient Prescriptions  Medication Sig Dispense Refill  . acetaminophen (TYLENOL) 500 MG tablet Take 500 mg by mouth every 4 (four) hours as needed for fever (pain).    . Ascorbic Acid (VITAMIN C) 1000 MG tablet Take 1,000 mg by mouth daily.    Marland Kitchen atorvastatin (LIPITOR) 40 MG tablet TAKE ONE TABLET BY MOUTH ONCE DAILY AT  6PM 90 tablet 3  . furosemide (LASIX) 40 MG tablet Take 1 tablet (40 mg total) by mouth daily. 30 tablet 0  . gabapentin (NEURONTIN) 300 MG capsule Take 1 capsule (300 mg total) by mouth 2 (  two) times daily. (Patient taking differently: Take 300 mg by mouth at bedtime. ) 180 capsule 3  . lisinopril (PRINIVIL,ZESTRIL) 5 MG tablet TAKE 1 TABLET BY MOUTH ONCE DAILY 90 tablet 3  . metoprolol succinate (TOPROL-XL) 50 MG 24 hr tablet Take 75 mg (1.5 tabs), twice a day. Take with or immediately following a meal. 90 tablet 3  . nitroGLYCERIN (NITROSTAT) 0.4 MG SL tablet Place 1 tablet (0.4 mg total) under the tongue every 5 (five) minutes x 3 doses as needed for chest pain. 25 tablet 2  . potassium  chloride SA (K-DUR,KLOR-CON) 10 MEQ tablet Take 1 tablet (10 mEq total) by mouth daily. 30 tablet 0  . warfarin (COUMADIN) 5 MG tablet TAKE BY MOUTH AS DIRECTED BY COUMADIN CLINIC 105 tablet 1   No current facility-administered medications for this encounter.     No Known Allergies  Social History        Social History  . Marital status: Married    Spouse name: N/A  . Number of children: N/A  . Years of education: N/A      Occupational History  . Not on file.       Social History Main Topics  . Smoking status: Never Smoker  . Smokeless tobacco: Never Used  . Alcohol use No  . Drug use: No  . Sexual activity: Not on file       Other Topics Concern  . Not on file      Social History Narrative  . No narrative on file         Family History  Problem Relation Age of Onset  . Cancer Mother        breast  . Cancer Father        colon    ROS- All systems are reviewed and negative except as per the HPI above  Physical Exam:    Vitals:   07/16/17 0957  BP: 118/62  Pulse: 75  Weight: 190 lb 9.6 oz (86.5 kg)  Height: 5\' 9"  (1.753 m)      Wt Readings from Last 3 Encounters:  07/16/17 190 lb 9.6 oz (86.5 kg)  06/27/17 196 lb (88.9 kg)  06/14/17 189 lb 1.6 oz (85.8 kg)    Labs: Recent Labs       Lab Results  Component Value Date   NA 137 05/23/2017   K 4.5 05/23/2017   CL 103 05/23/2017   CO2 26 05/23/2017   GLUCOSE 93 05/23/2017   BUN 14 05/23/2017   CREATININE 1.46 (H) 05/23/2017   CALCIUM 8.8 (L) 05/23/2017   MG 1.9 05/23/2017     Recent Labs       Lab Results  Component Value Date   INR 3.7 07/16/2017     Recent Labs  Lab Results  Component Value Date   CHOL 150 02/15/2016   HDL 66 02/15/2016   LDLCALC 64 02/15/2016   TRIG 98 02/15/2016       GEN- The patient is well appearing, alert and oriented x 3 today.   Head- normocephalic, atraumatic Eyes-  Sclera clear, conjunctiva pink Ears-  hearing intact Oropharynx- clear Neck- supple, no JVP Lymph- no cervical lymphadenopathy Lungs- Clear to ausculation bilaterally, normal work of breathing Heart- irregular rate and rhythm, no murmurs, rubs or gallops, PMI not laterally displaced GI- soft, NT, ND, + BS Extremities- no clubbing, cyanosis, or edema MS- no significant deformity or atrophy Skin- no rash or lesion Psych- euthymic mood, full affect Neuro-  strength and sensation are intact  EKG-afib rate controlled vrs junctional rhythm with PC's, qtc 453 ms    Assessment and Plan: 1. Symptomatic persistent afib, pending hosptitalization for tikosyn Pt does not have Medicare D coverage so that means he would have to pay $700 out pocket a month for generic dofetilide He checked and feels that  he Waleska Buttery qualify for financial assistance, pt has forms and Zeshan Sena bring to [place] when d/c dose is known and we Matilyn Fehrman do the final processing  Qtc length  are between 453-490 ms, Jabbar Palmero have to monitor carefully His renal function Ivadell Gaul only allow for the dose of 250 mcg bid of dofetilide to be used, crcllat 40 mg/dl, Creatinine is slightly higher today at 1.63 than baseline due to wife giving by mistake lasix 40 mg bid for the last week instead of 40 mg a day, K+/mag ok to start tikosyn Drugs screened by PharmD and no qtc prolonging drugs on board He has had 4 weekly therapeutic INR's,10/11-2.2, 10/18-2.7,10/25-2.7,10/30-3.7 If he fails tikosyn, amiodarone an option Admission bed anticipated to be ready  mid afternoon  [place] [address] [place], [place] [address] [phone]          I have seen and examined this patient with Roderic Palau.  Agree with above, note added to reflect my findings.  On exam, iRRR, no murmurs, lungs clear. Patient admitted for Tikosyn loading. Breigh Annett get initial dose today at 250 mcg due to decreased renal function. Plan for K and Mg daily and ECG  after each dose.    Cornelius Marullo M. Lafreda Casebeer MD 07/16/2017 4:34 PM

## 2017-07-16 NOTE — Progress Notes (Signed)
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 male with a h/o persistent since 2016, CAD and chronic systolic CHF, that is in the afib clinic at the request of Dr. Aundra Dubin to be considered for tikosyn. Echo  in 3/17 showed EF 40% with mildly decreased RV systolic function.  He has remained in atrial fibrillation since that time. Echo 2018 showed improvement to EF of 55 to 60%, possibly improved with better rate control. He has had 4 therapeutic INR's.  He developed symptomatic CHF but this has been improved with Lasix use. Of note, he is supposed to be taking lasix daily but his wife realized today that she has been giving this bid for the last week.  He notes dyspnea walking up 1 flight of steps or walking 1 block or so on flat ground.  No orthopnea/PND. No chest pain.  Occasional lightheadedness when he first stands up.    Qtc is borderline and will have to be monitored closely. PharmD did not identify any qtc prolonging drugs. Pt does state that he drinks alcohol most nights,drank 2 shots last pm, but feels that he will be ok in the hospital for 3 nights without withdrawal symptoms.  Today, he denies symptoms of palpitations, chest pain, shortness of breath, orthopnea, PND, lower extremity edema, dizziness, presyncope, syncope, or neurologic sequela. The patient is tolerating medications without difficulties and is otherwise without complaint today.   Past Medical History:  Diagnosis Date  . Allergy    hay fever, allergies  . CAD (coronary artery disease)    DES to mid LAD 11/10/2014  . Chronic systolic heart failure ([place])   . History of echocardiogram    Echo 3/17: EF 40%, diff HK, trivial AI, trivial MR, mod LAE, mild reduced RVSF, mod RAE  . Hyperlipidemia   . Hypertension   . Persistent atrial fibrillation [facility])    Past Surgical History:  Procedure Laterality Date  . APPENDECTOMY  1947  . CARDIOVERSION N/A  12/30/2014   Procedure: CARDIOVERSION;  Surgeon: Jerline Pain, MD;  Location: [place];  Service: Cardiovascular;  Laterality: N/A;  . LEFT HEART CATHETERIZATION WITH CORONARY ANGIOGRAM N/A 11/10/2014   Procedure: LEFT HEART CATHETERIZATION WITH CORONARY ANGIOGRAM;  Surgeon: Wellington Hampshire, MD;  Location: [place] CATH LAB;  Service: Cardiovascular;  Laterality: N/A;  . PERCUTANEOUS CORONARY STENT INTERVENTION (PCI-S)  11/10/2014   Procedure: PERCUTANEOUS CORONARY STENT INTERVENTION (PCI-S);  Surgeon: Wellington Hampshire, MD;  Location: [facility] CATH LAB;  Service: Cardiovascular;;    Current Outpatient Prescriptions  Medication Sig Dispense Refill  . acetaminophen (TYLENOL) 500 MG tablet Take 500 mg by mouth every 4 (four) hours as needed for fever (pain).    . Ascorbic Acid (VITAMIN C) 1000 MG tablet Take 1,000 mg by mouth daily.    Marland Kitchen atorvastatin (LIPITOR) 40 MG tablet TAKE ONE TABLET BY MOUTH ONCE DAILY AT  6PM 90 tablet 3  . furosemide (LASIX) 40 MG tablet Take 1 tablet (40 mg total) by mouth daily. 30 tablet 0  . gabapentin (NEURONTIN) 300 MG capsule Take 1 capsule (300 mg total) by mouth 2 (two) times daily. (Patient taking differently: Take 300 mg by mouth at bedtime. ) 180 capsule 3  . lisinopril (PRINIVIL,ZESTRIL) 5 MG tablet TAKE 1 TABLET BY MOUTH ONCE DAILY 90 tablet 3  . metoprolol succinate (TOPROL-XL) 50 MG 24 hr tablet Take 75 mg (1.5 tabs), twice a day. Take with or immediately  following a meal. 90 tablet 3  . nitroGLYCERIN (NITROSTAT) 0.4 MG SL tablet Place 1 tablet (0.4 mg total) under the tongue every 5 (five) minutes x 3 doses as needed for chest pain. 25 tablet 2  . potassium chloride SA (K-DUR,KLOR-CON) 10 MEQ tablet Take 1 tablet (10 mEq total) by mouth daily. 30 tablet 0  . warfarin (COUMADIN) 5 MG tablet TAKE BY MOUTH AS DIRECTED BY COUMADIN CLINIC 105 tablet 1   No current facility-administered medications for this encounter.     No Known Allergies  Social History   Social  History  . Marital status: Married    Spouse name: N/A  . Number of children: N/A  . Years of education: N/A   Occupational History  . Not on file.   Social History Main Topics  . Smoking status: Never Smoker  . Smokeless tobacco: Never Used  . Alcohol use No  . Drug use: No  . Sexual activity: Not on file   Other Topics Concern  . Not on file   Social History Narrative  . No narrative on file    Family History  Problem Relation Age of Onset  . Cancer Mother        breast  . Cancer Father        colon    ROS- All systems are reviewed and negative except as per the HPI above  Physical Exam: Vitals:   07/16/17 0957  BP: 118/62  Pulse: 75  Weight: 190 lb 9.6 oz (86.5 kg)  Height: 5\' 9"  (1.753 m)   Wt Readings from Last 3 Encounters:  07/16/17 190 lb 9.6 oz (86.5 kg)  06/27/17 196 lb (88.9 kg)  06/14/17 189 lb 1.6 oz (85.8 kg)    Labs: Lab Results  Component Value Date   NA 137 05/23/2017   K 4.5 05/23/2017   CL 103 05/23/2017   CO2 26 05/23/2017   GLUCOSE 93 05/23/2017   BUN 14 05/23/2017   CREATININE 1.46 (H) 05/23/2017   CALCIUM 8.8 (L) 05/23/2017   MG 1.9 05/23/2017   Lab Results  Component Value Date   INR 3.7 07/16/2017   Lab Results  Component Value Date   CHOL 150 02/15/2016   HDL 66 02/15/2016   LDLCALC 64 02/15/2016   TRIG 98 02/15/2016     GEN- The patient is well appearing, alert and oriented x 3 today.   Head- normocephalic, atraumatic Eyes-  Sclera clear, conjunctiva pink Ears- hearing intact Oropharynx- clear Neck- supple, no JVP Lymph- no cervical lymphadenopathy Lungs- Clear to ausculation bilaterally, normal work of breathing Heart- irregular rate and rhythm, no murmurs, rubs or gallops, PMI not laterally displaced GI- soft, NT, ND, + BS Extremities- no clubbing, cyanosis, or edema MS- no significant deformity or atrophy Skin- no rash or lesion Psych- euthymic mood, full affect Neuro- strength and sensation are  intact  EKG-afib rate controlled vrs junctional rhythm with PC's, qtc 453 ms    Assessment and Plan: 1. Symptomatic persistent afib, pending hosptitalization for tikosyn Pt does not have Medicare D coverage so that means he would have to pay $700 out pocket a month for generic dofetilide He checked and feels that  he will qualify for financial assistance, pt has forms and will bring to [place] when d/c dose is known and we will do the final processing  Qtc length  are between 453-490 ms, will have to monitor carefully His renal function will only allow for the dose of 250 mcg  bid of dofetilide to be used, crcllat 40 mg/dl, Creatinine is slightly higher today at 1.63 than baseline due to wife giving by mistake lasix 40 mg bid for the last week instead of 40 mg a day, K+/mag ok to start tikosyn Drugs screened by PharmD and no qtc prolonging drugs on board He has had 4 weekly therapeutic INR's,10/11-2.2, 10/18-2.7,10/25-2.7,10/30-3.7 If he fails tikosyn, amiodarone an option Admission bed anticipated to be ready  mid afternoon  Butch Penny C. Chellie Vanlue, [facility] [address] [place],  [address] [phone]

## 2017-07-16 NOTE — Progress Notes (Signed)
Pharmacy Review for Dofetilide (Tikosyn) Initiation  Admit Complaint: 83 y.o. male admitted 07/16/2017 with atrial fibrillation to be initiated on dofetilide.   Assessment:  Patient Exclusion Criteria: If any screening criteria checked as "Yes", then  patient  should NOT receive dofetilide until criteria item is corrected. If "Yes" please indicate correction plan.  YES  NO Patient  Exclusion Criteria Correction Plan  [x]  []  Baseline QTc interval is greater than or equal to 440 msec. IF above YES box checked dofetilide contraindicated unless patient has ICD; then may proceed if QTc 500-550 msec or with known ventricular conduction abnormalities may proceed with QTc 550-600 msec. QTc =  453  MD aware and have made note of this - to monitor carefully with initiation  []  [x]  Magnesium level is less than 1.8 mEq/l : Last magnesium:  Lab Results  Component Value Date   MG 1.8 07/16/2017         []  [x]  Potassium level is less than 4 mEq/l : Last potassium:  Lab Results  Component Value Date   K 4.0 07/16/2017         []  [x]  Patient is known or suspected to have a digoxin level greater than 2 ng/ml: No results found for: DIGOXIN    []  [x]  Creatinine clearance less than 20 ml/min (calculated using Cockcroft-Gault, actual body weight and serum creatinine): Estimated Creatinine Clearance: 36.1 mL/min (A) (by C-G formula based on SCr of 1.63 mg/dL (H)). CrCl is 40 ml/min based on actual body weight   []  [x]  Patient has received drugs known to prolong the QT intervals within the last 48 hours (phenothiazines, tricyclics or tetracyclic antidepressants, erythromycin, H-1 antihistamines, cisapride, fluoroquinolones, azithromycin). Drugs not listed above may have an, as yet, undetected potential to prolong the QT interval, updated information on QT prolonging agents is available at this website:QT prolonging agents   []  [x]  Patient received a dose of hydrochlorothiazide (Oretic) alone or in any  combination including triamterene (Dyazide, Maxzide) in the last 48 hours.   []  [x]  Patient received a medication known to increase dofetilide plasma concentrations prior to initial dofetilide dose:  . Trimethoprim (Primsol, Proloprim) in the last 36 hours . Verapamil (Calan, Verelan) in the last 36 hours or a sustained release dose in the last 72 hours . Megestrol (Megace) in the last 5 days  . Cimetidine (Tagamet) in the last 6 hours . Ketoconazole (Nizoral) in the last 24 hours . Itraconazole (Sporanox) in the last 48 hours  . Prochlorperazine (Compazine) in the last 36 hours    []  [x]  Patient is known to have a history of torsades de pointes; congenital or acquired long QT syndromes.   []  [x]  Patient has received a Class 1 antiarrhythmic with less than 2 half-lives since last dose. (Disopyramide, Quinidine, Procainamide, Lidocaine, Mexiletine, Flecainide, Propafenone)   []  [x]  Patient has received amiodarone therapy in the past 3 months or amiodarone level is greater than 0.3 ng/ml.    Patient has been appropriately anticoagulated with Warfarin  Ordering provider was confirmed at [email or web address] if they are not listed on the [facility] Prescribers list.  Goal of Therapy: Follow renal function, electrolytes, potential drug interactions, and dose adjustment. Provide education and 1 week supply at discharge.  Plan:  [x]   Physician selected initial dose within range recommended for patients level of renal function - will monitor for response.  [x]   Physician selected initial dose outside of range recommended for patients level of renal function - will discuss if the  dose should be altered at this time.   Select One Calculated CrCl  Dose q12h  []  > 60 ml/min 500 mcg  [x]  40-60 ml/min 250 mcg  [x]  20-40 ml/min 125 mcg   CrCl based on actual body weight is right at 40 ml/min using SCr value of 1.63 from 07/16/17 however it does appear as if the patient's normal SCr range is  lower in the 1.4's. MD has chosen the higher dose of 250 mg bid. Will need close monitoring.   2. Follow up QTc after the first 5 doses, renal function, electrolytes (K & Mg) daily x 3     days, dose adjustment, success of initiation and facilitate 1 week discharge supply as     clinically indicated.  3. Initiate Tikosyn education video (Call [phone] and ask for Tikosyn Video # [phone]).  4. Place Enrollment Form on the chart for discharge supply of dofetilide.   Lawson Radar 4:28 PM 07/16/2017

## 2017-07-16 NOTE — H&P (Addendum)
Cardiology Admission History and Physical:   Patient ID: Ricardo Valencia; MRN: [id number]; DOB: 1928/09/21   Admission date: 07/16/2017  Primary Care Provider: Eulas Post, MD Primary Cardiologist: Dr. Angelena Form AHF: Dr. Aundra Dubin   Chief Complaint:  Tikosyn initiation  Patient Profile:   Ricardo Valencia is a 83 y.o. male has history of persistent AFib, chronic CHF, CAD, CM with improved EF, HTN, CRI, HLD  History of Present Illness:   Mr. Hedeen was referred to the AFib clinic out patient by Dr. Aundra Dubin for evaluation/plans for Tikosyn initiation.  He is being admitted today for tikosyn initiation for his persistent AFib.   He has had 4 consectutive weekly therapeutic INRs to today.  The patient denies any active complaints, he feels his energy level waxes/wanes, as well as some occasional DOE, this is suspected to be 2/2 his AF.    Past Medical History:  Diagnosis Date  . Allergy    hay fever, allergies  . CAD (coronary artery disease)    DES to mid LAD 11/10/2014  . Chronic systolic heart failure ([place])   . History of echocardiogram    Echo 3/17: EF 40%, diff HK, trivial AI, trivial MR, mod LAE, mild reduced RVSF, mod RAE  . Hyperlipidemia   . Hypertension   . Persistent atrial fibrillation [facility])     Past Surgical History:  Procedure Laterality Date  . APPENDECTOMY  1947  . CARDIOVERSION N/A 12/30/2014   Procedure: CARDIOVERSION;  Surgeon: Jerline Pain, MD;  Location: [facility];  Service: Cardiovascular;  Laterality: N/A;  . LEFT HEART CATHETERIZATION WITH CORONARY ANGIOGRAM N/A 11/10/2014   Procedure: LEFT HEART CATHETERIZATION WITH CORONARY ANGIOGRAM;  Surgeon: Wellington Hampshire, MD;  Location: [place] CATH LAB;  Service: Cardiovascular;  Laterality: N/A;  . PERCUTANEOUS CORONARY STENT INTERVENTION (PCI-S)  11/10/2014   Procedure: PERCUTANEOUS CORONARY STENT INTERVENTION (PCI-S);  Surgeon: Wellington Hampshire, MD;  Location: [facility] CATH LAB;  Service:  Cardiovascular;;     Medications Prior to Admission: Prior to Admission medications   Medication Sig Start Date End Date Taking? Authorizing Provider  acetaminophen (TYLENOL) 500 MG tablet Take 500 mg by mouth every 4 (four) hours as needed for fever (pain).    [provider]  Ascorbic Acid (VITAMIN C) 1000 MG tablet Take 1,000 mg by mouth daily.    [provider]  atorvastatin (LIPITOR) 40 MG tablet TAKE ONE TABLET BY MOUTH ONCE DAILY AT  [facility] 07/01/17   Burnell Blanks, MD  furosemide (LASIX) 40 MG tablet Take 1 tablet (40 mg total) by mouth daily. 04/05/17 07/16/17  Burnell Blanks, MD  gabapentin (NEURONTIN) 300 MG capsule Take 1 capsule (300 mg total) by mouth 2 (two) times daily. Patient taking differently: Take 300 mg by mouth at bedtime.  03/01/17   Burchette, Alinda Sierras, MD  lisinopril (PRINIVIL,ZESTRIL) 5 MG tablet TAKE 1 TABLET BY MOUTH ONCE DAILY 06/07/17   Burnell Blanks, MD  metoprolol succinate (TOPROL-XL) 50 MG 24 hr tablet Take 75 mg (1.5 tabs), twice a day. Take with or immediately following a meal. 05/23/17   Larey Dresser, MD  nitroGLYCERIN (NITROSTAT) 0.4 MG SL tablet Place 1 tablet (0.4 mg total) under the tongue every 5 (five) minutes x 3 doses as needed for chest pain. 06/25/16   Arbutus Leas, NP  potassium chloride SA (K-DUR,KLOR-CON) 10 MEQ tablet Take 1 tablet (10 mEq total) by mouth daily. 04/05/17   Burnell Blanks, MD  warfarin (COUMADIN)  5 MG tablet TAKE BY MOUTH AS DIRECTED BY COUMADIN CLINIC 03/05/17   Burnell Blanks, MD     Allergies:   No Known Allergies  Social History:   Social History   Social History  . Marital status: Married    Spouse name: N/A  . Number of children: N/A  . Years of education: N/A   Occupational History  . Not on file.   Social History Main Topics  . Smoking status: Never Smoker  . Smokeless tobacco: Never Used  . Alcohol use No  . Drug use: No  . Sexual activity: Not  on file   Other Topics Concern  . Not on file   Social History Narrative  . No narrative on file    Family History:   The patient's family history includes Cancer in his father and mother.    ROS:  Please see the history of present illness.  All other ROS reviewed and negative.     Physical Exam/Data:   Vitals:   07/16/17 1552  BP: 120/67  Pulse: 62  Temp: 98.3 F (36.8 C)  TempSrc: Oral  SpO2: 100%   No intake or output data in the 24 hours ending 07/16/17 1641 There were no vitals filed for this visit. There is no height or weight on file to calculate BMI.  General:  Well nourished, well developed, in no acute distress HEENT: normal Lymph: no adenopathy Neck: no JVD Endocrine:  No thryomegaly Vascular: No carotid bruits  Cardiac:  IRRR; no murmurs, gallops or rubs Lungs:  CTA b/l, no wheezing, rhonchi or rales  Abd: soft, nontender  Ext: no edema Musculoskeletal:  No deformities Skin: warm and dry  Neuro:  No gross focal abnormalities noted Psych:  Normal affect    EKG:   Today's EKG is Afib, 75bpm, QTc 446ms (reviewed with Dr. Curt Bears)  Relevant CV Studies:  - Echo (2/16) with EF 25-30%, moderate MR while in afib.  - Echo (4/16) in NSR with EF 45-50%, no MR.  - Echo (3/17) in atrial fibrillation with EF 40%, mildly decreased RV systolic function.  - Echo (10/18): EF 55-60%, basal inferior akinesis, mild LVH, mildly dilated RV with mildly decreased systolic function, mild MR. (LA 27mm)  Laboratory Data:  Chemistry  Recent Labs Lab 07/16/17 0948  NA 139  K 4.0  CL 101  CO2 30  GLUCOSE 89  BUN 12  CREATININE 1.63*  CALCIUM 8.5*  GFRNONAA 37*  GFRAA 43*  ANIONGAP 8     Radiology/Studies:  No results found.  Assessment and Plan:   1. Persistent AFib     CHA2DS2Vasc is at least 5, on warfarin     K+ 4.0     Mag 1.8 is ok, but will supplement     Creat 1.63 (calc creat cl = 40)     INR 3.7     EKG/QTc are reviewed with Dr. Curt Bears, [place]  to proceed.  Will start 260mcg BID DCCV Thursday is not in SR  2. HTN     Continue home meds  3. CAD     Last intervention 2016     No anginal complaints     Continue home meds  4. CKD stage III     Follow labs closely  5. Chronic CHF, improved LVEF     Exam does not suggest volume OL        For questions or updates, please contact [place] Please consult [email or web address] for contact info under  Cardiology/STEMI.    Signed, Baldwin Jamaica, PA-C  07/16/2017 4:41 PM   I have seen and examined this patient with Tommye Standard.  Agree with above, note added to reflect my findings.  On exam, iRRR, no murmurs, lungs clear. Admit for tikosyn loading. Plan for ECG after each dose.     Will [name] 07/17/2017 7:42 AM

## 2017-07-16 NOTE — Progress Notes (Signed)
ANTICOAGULATION CONSULT NOTE - Initial Consult  Pharmacy Consult for Warfarin Indication: atrial fibrillation  No Known Allergies  Patient Measurements:    Vital Signs: Temp: 98.3 F (36.8 C) (10/30 1552) Temp Source: Oral (10/30 1552) BP: 120/67 (10/30 1552) Pulse Rate: 62 (10/30 1552)  Labs:  Recent Labs  07/16/17 0927 07/16/17 0948  INR 3.7  --   CREATININE  --  1.63*    Estimated Creatinine Clearance: 36.1 mL/min (A) (by C-G formula based on SCr of 1.63 mg/dL (H)).   Medical History: Past Medical History:  Diagnosis Date  . Allergy    hay fever, allergies  . CAD (coronary artery disease)    DES to mid LAD 11/10/2014  . Chronic systolic heart failure ([place])   . History of echocardiogram    Echo 3/17: EF 40%, diff HK, trivial AI, trivial MR, mod LAE, mild reduced RVSF, mod RAE  . Hyperlipidemia   . Hypertension   . Persistent atrial fibrillation (HCC)     Assessment: 26 YOM with hx Afib who presented on 10/30 for planned Tikosyn initiation. The patient's INR on admission was 3.7 however the patient was noted to drink EtOH yesterday evening.   The patient had already been seen and evaluated by the anticoagulation clinic earlier today and the plan was to take a 2.5 mg dose today then to resume his home dosing of 5 mg daily. The patient's last dose was noted to be on 10/29.   Goal of Therapy:  INR 2-3 Monitor platelets by anticoagulation protocol: Yes   Plan:  1. Warfarin 2.5 mg x 1 dose at 1800 today 2. Daily PT/INR 3. Will continue to monitor for any signs/symptoms of bleeding and will follow up with PT/INR in the a.m.   Thank you for allowing pharmacy to be a part of this patient's care.  Alycia Rossetti, PharmD, BCPS Clinical Pharmacist Pager: [phone] If after 3:30p, please call main pharmacy at: [phone] 07/16/2017 4:45 PM

## 2017-07-17 LAB — BASIC METABOLIC PANEL
Anion gap: 8 (ref 5–15)
BUN: 12 mg/dL (ref 6–20)
CO2: 29 mmol/L (ref 22–32)
CREATININE: 1.51 mg/dL — AB (ref 0.61–1.24)
Calcium: 8.2 mg/dL — ABNORMAL LOW (ref 8.9–10.3)
Chloride: 101 mmol/L (ref 101–111)
GFR calc Af Amer: 47 mL/min — ABNORMAL LOW (ref >60)
GFR calc non Af Amer: 40 mL/min — ABNORMAL LOW (ref >60)
GLUCOSE: 94 mg/dL (ref 65–99)
Potassium: 3.9 mmol/L (ref 3.5–5.1)
SODIUM: 138 mmol/L (ref 135–145)

## 2017-07-17 LAB — PROTIME-INR
INR: 3.02
Prothrombin Time: 31.1 seconds — ABNORMAL HIGH (ref 11.4–15.2)

## 2017-07-17 LAB — MAGNESIUM: Magnesium: 1.8 mg/dL (ref 1.7–2.4)

## 2017-07-17 MED ORDER — POTASSIUM CHLORIDE CRYS ER 20 MEQ PO TBCR
20.0000 meq | EXTENDED_RELEASE_TABLET | Freq: Once | ORAL | Status: AC
  Administered 2017-07-17: 20 meq via ORAL
  Filled 2017-07-17: qty 1

## 2017-07-17 MED ORDER — DOFETILIDE 125 MCG PO CAPS
125.0000 ug | ORAL_CAPSULE | Freq: Two times a day (BID) | ORAL | Status: DC
  Administered 2017-07-17 (×2): 125 ug via ORAL
  Filled 2017-07-17 (×2): qty 1

## 2017-07-17 MED ORDER — SODIUM CHLORIDE 0.9% FLUSH: 3.0000 mL | INTRAVENOUS | Status: DC | PRN

## 2017-07-17 MED ORDER — SODIUM CHLORIDE 0.9 % IV SOLN
250.0000 mL | INTRAVENOUS | Status: DC
  Administered 2017-07-17: 250 mL via INTRAVENOUS

## 2017-07-17 MED ORDER — MAGNESIUM SULFATE 2 GM/50ML IV SOLN
2.0000 g | Freq: Once | INTRAVENOUS | Status: AC
  Administered 2017-07-17: 2 g via INTRAVENOUS
  Filled 2017-07-17: qty 50

## 2017-07-17 MED ORDER — HYDROCORTISONE 1 % EX CREA
1.0000 "application " | TOPICAL_CREAM | Freq: Three times a day (TID) | CUTANEOUS | Status: DC | PRN
  Filled 2017-07-17: qty 28

## 2017-07-17 MED ORDER — WARFARIN SODIUM 2.5 MG PO TABS
2.5000 mg | ORAL_TABLET | Freq: Once | ORAL | Status: AC
  Administered 2017-07-17: 2.5 mg via ORAL
  Filled 2017-07-17: qty 1

## 2017-07-17 MED ORDER — SODIUM CHLORIDE 0.9% FLUSH
3.0000 mL | Freq: Two times a day (BID) | INTRAVENOUS | Status: DC
  Administered 2017-07-17 – 2017-07-18 (×3): 3 mL via INTRAVENOUS

## 2017-07-17 NOTE — Progress Notes (Signed)
Progress Note  Patient Name: Ricardo Valencia Date of Encounter: 07/17/2017  Primary Cardiologist: Dr. Angelena Form AHF: Dr. Aundra Dubin  Subjective   No complaints, no CP, palpitations, SOB, tolerating medicine  Inpatient Medications    Scheduled Meds: . atorvastatin  40 mg Oral Daily  . dofetilide  125 mcg Oral BID  . furosemide  40 mg Oral Daily  . gabapentin  300 mg Oral QHS  . metoprolol succinate  75 mg Oral BID  . potassium chloride  10 mEq Oral Daily  . potassium chloride  20 mEq Oral Once  . sodium chloride flush  3 mL Intravenous Q12H  . vitamin C  1,000 mg Oral Daily  . Warfarin - Pharmacist Dosing Inpatient   Does not apply q1800   Continuous Infusions: . sodium chloride    . magnesium sulfate 1 - 4 g bolus IVPB     PRN Meds: sodium chloride, nitroGLYCERIN, sodium chloride flush   Vital Signs    Vitals:   07/16/17 1552 07/16/17 2003 07/17/17 0500  BP: 120/67 133/60 126/78  Pulse: 62 74 76  Resp:  16 16  Temp: 98.3 F (36.8 C) 98 F (36.7 C) 97.8 F (36.6 C)  TempSrc: Oral Oral Oral  SpO2: 100% 100% 99%  Weight:  190 lb 11.2 oz (86.5 kg) 190 lb 3.2 oz (86.3 kg)  Height:  5\' 9"  (1.753 m)    No intake or output data in the 24 hours ending 07/17/17 0842 Filed Weights   07/16/17 2003 07/17/17 0500  Weight: 190 lb 11.2 oz (86.5 kg) 190 lb 3.2 oz (86.3 kg)    Telemetry    AFib 70's-80's - Personally Reviewed  ECG    Afib, EKG is reviewed with Dr. Curt Bears, QT prolonged - Personally Reviewed  Physical Exam   GEN: No acute distress.   Neck: No JVD Cardiac: IRRR,soft SM, no rubs, or gallops.  Respiratory: Clear to auscultation bilaterally. GI: Soft, nontender, non-distended  MS: No edema; No deformity. Neuro:  Nonfocal  Psych: Normal affect   Labs    Chemistry Recent Labs Lab 07/16/17 0948 07/17/17 0340  NA 139 138  K 4.0 3.9  CL 101 101  CO2 30 29  GLUCOSE 89 94  BUN 12 12  CREATININE 1.63* 1.51*  CALCIUM 8.5* 8.2*  GFRNONAA 37*  40*  GFRAA 43* 47*  ANIONGAP 8 8     HematologyNo results for input(s): WBC, RBC, HGB, HCT, MCV, MCH, MCHC, RDW, PLT in the last 168 hours.  Cardiac EnzymesNo results for input(s): TROPONINI in the last 168 hours. No results for input(s): TROPIPOC in the last 168 hours.   BNPNo results for input(s): BNP, PROBNP in the last 168 hours.   DDimer No results for input(s): DDIMER in the last 168 hours.   Radiology    No results found.  Cardiac Studies   - Echo (2/16) with EF 25-30%, moderate MR while in afib.  - Echo (4/16) in NSR with EF 45-50%, no MR.  - Echo (3/17) in atrial fibrillation with EF 40%, mildly decreased RV systolic function.  - Echo (10/18): EF 55-60%, basal inferior akinesis, mild LVH, mildly dilated RV with mildly decreased systolic function, mild MR. (LA 6mm)  Patient Profile     83 y.o. male has history of persistent AFib, chronic CHF, CAD, CM with improved EF, HTN, CRI, HLD admitted for Tikosyn initiation  Assessment & Plan     1. Persistent AFib     CHA2DS2Vasc is at least  5, on warfarin     K+ 43.9 (replacement ordered)     Mag 1.8 (replacement ordered)     Creat 1.51      INR 3.02     EKG/QTc are reviewed with Dr. Curt Bears, increased from yesterday dose decreased to 155mcg.  If remains long Mellonie Guess need to stop and likely plan for amiodarone  DCCV tomorrow if not in SR  2. HTN     no change  3. CAD     Last intervention 2016     No anginal complaints     Continue home meds  4. CKD stage III     Follow labs closely  5. Chronic CHF, improved LVEF     Exam does not suggest volume OL    For questions or updates, please contact [place] Please consult [email or web address] for contact info under Cardiology/STEMI.      Signed, Baldwin Jamaica, PA-C  07/17/2017, 8:42 AM    I have seen and examined this patient with Tommye Standard.  Agree with above, note added to reflect my findings.  On exam, iRRR, no murmurs, lungs clear.  First dose of 250  mcg of [facility] and resulted in prolonged QTC.  Dose reduced to 125 mcg.  I am concerned that T consent Jasmeet Gehl prolong his QTC he Shaquala Broeker not tolerate the medication.  Should his QTC continue to prolong, would benefit from amiodarone.  Tashawn Greff M. Jullia Mulligan MD 07/17/2017 9:17 AM

## 2017-07-17 NOTE — Care Management Note (Addendum)
Case Management Note  Patient Details  Name: Ricardo Valencia MRN: [id number] Date of Birth: [birth year]  Subjective/Objective: Pt presented for Tikosyn Load. PTA independent from home with wife. Plan will be to return home once stable. Pt has Medicare Part A & B no Part D for Rx drug Coverage. Pt has been to the [facility] and Patient Assistance Application provided from Office- once dosage has been confirmed office to assist with Patient Assistance for [place]. CM did reach out to PA in regards to question if office has samples.                Action/Plan: Pt will need a Rx for 7 day supply Tikosyn without refills and CM will assist with Rx from [place]. Pt will also need a Rx with refills  Expected Discharge Date:                  Expected Discharge Plan:  Home/Self Care  In-House Referral:  NA  Discharge planning Services  CM Consult, Medication Assistance  Post Acute Care Choice:  NA Choice offered to:  Patient  DME Arranged:  N/A DME Agency:  NA  HH Arranged:  NA HH Agency:  NA  Status of Service:  Completed, signed off  If discussed at [place] of Stay Meetings, dates discussed:    Additional Comments: 07-19-17 [place], RN, BSN [phone] Pt will not d/c on Tikosyn- Plan will be for home on Amiodarone. No further needs from CM at this time.    Bethena Roys, RN 07/17/2017, 11:24 AM

## 2017-07-17 NOTE — Progress Notes (Signed)
[place] for Warfarin Indication: atrial fibrillation  No Known Allergies  Patient Measurements: Height: 5\' 9"  (175.3 cm) Weight: 190 lb 3.2 oz (86.3 kg) IBW/kg (Calculated) : 70.7  Vital Signs: Temp: 97.8 F (36.6 C) (10/31 0500) Temp Source: Oral (10/31 0500) BP: 126/78 (10/31 0500) Pulse Rate: 76 (10/31 0500)  Labs:  Recent Labs  07/16/17 0927 07/16/17 0948 07/17/17 0340  LABPROT  --   --  31.1*  INR 3.7  --  3.02  CREATININE  --  1.63* 1.51*    Estimated Creatinine Clearance: 38.9 mL/min (A) (by C-G formula based on SCr of 1.51 mg/dL (H)).   Medical History: Past Medical History:  Diagnosis Date  . CAD (coronary artery disease)    DES to mid LAD 11/10/2014  . Chronic systolic heart failure ([place])   . Hay fever    hay fever, allergies  . History of echocardiogram    Echo 3/17: EF 40%, diff HK, trivial AI, trivial MR, mod LAE, mild reduced RVSF, mod RAE  . Hyperlipidemia   . Hypertension   . Persistent atrial fibrillation (HCC)     Assessment: 89 YOM with hx Afib who presented on 10/30 for planned Tikosyn initiation. The patient's INR on admission was 3.7. -INR= 3.01  Home dosing: The patient was seen and evaluated by the anticoagulation clinic 10/30 and the plan was to take a 2.5 mg dose today then to resume his home dosing of 5 mg daily. The patient's last dose was noted to be on 10/29.   Goal of Therapy:  INR 2-3 Monitor platelets by anticoagulation protocol: Yes   Plan:  - Warfarin 2.5 mg x 1 dose at 1800 today -Daily PT/INR  Hildred Laser, Pharm D 07/17/2017 8:53 AM

## 2017-07-18 ENCOUNTER — Encounter (HOSPITAL_COMMUNITY)
Admission: RE | Disposition: A | Discharge status: Home / Self Care | Payer: Self-pay | Source: Ambulatory Visit | Attending: Cardiology

## 2017-07-18 LAB — BASIC METABOLIC PANEL
ANION GAP: 6 (ref 5–15)
BUN: 13 mg/dL (ref 6–20)
CALCIUM: 8.1 mg/dL — AB (ref 8.9–10.3)
CO2: 29 mmol/L (ref 22–32)
Chloride: 103 mmol/L (ref 101–111)
Creatinine, Ser: 1.53 mg/dL — ABNORMAL HIGH (ref 0.61–1.24)
GFR, EST AFRICAN AMERICAN: 46 mL/min — AB (ref >60)
GFR, EST NON AFRICAN AMERICAN: 40 mL/min — AB (ref >60)
Glucose, Bld: 95 mg/dL (ref 65–99)
Potassium: 4.3 mmol/L (ref 3.5–5.1)
SODIUM: 138 mmol/L (ref 135–145)

## 2017-07-18 LAB — PROTIME-INR
INR: 2.61
PROTHROMBIN TIME: 27.7 s — AB (ref 11.4–15.2)

## 2017-07-18 LAB — MAGNESIUM: Magnesium: 2 mg/dL (ref 1.7–2.4)

## 2017-07-18 SURGERY — CARDIOVERSION
Anesthesia: General

## 2017-07-18 MED ORDER — WARFARIN SODIUM 5 MG PO TABS
5.0000 mg | ORAL_TABLET | Freq: Once | ORAL | Status: AC
  Administered 2017-07-18: 5 mg via ORAL
  Filled 2017-07-18: qty 1

## 2017-07-18 NOTE — Progress Notes (Signed)
Electrophysiology Rounding Note  Patient Name: BRAILYN DELMAN Date of Encounter: 07/18/2017  Primary Cardiologist: Angelena Form Heart Failure: Aundra Dubin Electrophysiologist: Curt Bears    Subjective   The patient is doing well today.  At this time, the patient denies chest pain, shortness of breath, or any new concerns.  Inpatient Medications    Scheduled Meds: . atorvastatin  40 mg Oral Daily  . furosemide  40 mg Oral Daily  . gabapentin  300 mg Oral QHS  . metoprolol succinate  75 mg Oral BID  . potassium chloride  10 mEq Oral Daily  . sodium chloride flush  3 mL Intravenous Q12H  . sodium chloride flush  3 mL Intravenous Q12H  . vitamin C  1,000 mg Oral Daily  . Warfarin - Pharmacist Dosing Inpatient   Does not apply q1800   Continuous Infusions: . sodium chloride    . sodium chloride 250 mL (07/17/17 1515)   PRN Meds: sodium chloride, hydrocortisone cream, nitroGLYCERIN, sodium chloride flush, sodium chloride flush   Vital Signs    Vitals:   07/17/17 1001 07/17/17 1500 07/17/17 2047 07/18/17 0521  BP: 103/74  123/67 107/64  Pulse: 70  87 (!) 56  Resp:  18 18 20   Temp:  97.7 F (36.5 C) 98.4 F (36.9 C) 98 F (36.7 C)  TempSrc:  Oral Oral Oral  SpO2:  99% 98% 97%  Weight:    190 lb 14.4 oz (86.6 kg)  Height:        Intake/Output Summary (Last 24 hours) at 07/18/17 0802 Last data filed at 07/17/17 2101  Gross per 24 hour  Intake           603.75 ml  Output              850 ml  Net          -246.25 ml   Filed Weights   07/16/17 2003 07/17/17 0500 07/18/17 0521  Weight: 190 lb 11.2 oz (86.5 kg) 190 lb 3.2 oz (86.3 kg) 190 lb 14.4 oz (86.6 kg)    Physical Exam    GEN- The patient is elderly appearing, alert and oriented x 3 today.   Head- normocephalic, atraumatic Eyes-  Sclera clear, conjunctiva pink Ears- hearing intact Oropharynx- clear Neck- supple Lungs- Clear to ausculation bilaterally, normal work of breathing Heart- Regular rate and rhythm    GI- soft, NT, ND, + BS Extremities- no clubbing, cyanosis, or edema Skin- no rash or lesion Psych- euthymic mood, full affect Neuro- strength and sensation are intact  Labs    Basic Metabolic Panel  Recent Labs  07/17/17 0340 07/18/17 0428  NA 138 138  K 3.9 4.3  CL 101 103  CO2 29 29  GLUCOSE 94 95  BUN 12 13  CREATININE 1.51* 1.53*  CALCIUM 8.2* 8.1*  MG 1.8 2.0    Telemetry    AF -> SR (personally reviewed)  Radiology    No results found.   Patient Profile     ANSON PEDDIE is a 83 y.o. male admitted for Tikosyn load   Assessment & Plan    1.  Persistent atrial fibrillation Converted to SR with Tikosyn. Unfortunately, QTc is significantly prolonged Stop Tikosyn Will need washout and repeat EKG later today. If QTc normalized, will start low dose amiodarone Will need pharmacy to help with Warfarin dosing at discharge  2.  HTN Stable No change required today  3.  CKD, stage III Stable No change required today  Signed, Chanetta Marshall, NP  07/18/2017, 8:02 AM   I have seen and examined this patient with Chanetta Marshall.  Agree with above, note added to reflect my findings.  On exam, RRR, no murmurs, lungs clear.  Converted to sinus rhythm with decrease in.  Unfortunately QTC is significantly prolonged on 125 mcg.  We will plan to stop [place] and.  Will repeat EKG later today with potential discharge.  He will need to be started on amiodarone which he can load as an outpatient.  Will [name] 07/18/2017 8:15 AM

## 2017-07-18 NOTE — Progress Notes (Signed)
[place] for Warfarin Indication: atrial fibrillation  No Known Allergies  Patient Measurements: Height: 5\' 9"  (175.3 cm) Weight: 190 lb 14.4 oz (86.6 kg) IBW/kg (Calculated) : 70.7  Vital Signs: Temp: 98 F (36.7 C) (11/01 0521) Temp Source: Oral (11/01 0521) BP: 107/64 (11/01 0521) Pulse Rate: 56 (11/01 0521)  Labs:  Recent Labs  07/16/17 0927 07/16/17 0948 07/17/17 0340 07/18/17 0428  LABPROT  --   --  31.1* 27.7*  INR 3.7  --  3.02 2.61  CREATININE  --  1.63* 1.51* 1.53*    Estimated Creatinine Clearance: 38.5 mL/min (A) (by C-G formula based on SCr of 1.53 mg/dL (H)).   Medical History: Past Medical History:  Diagnosis Date  . CAD (coronary artery disease)    DES to mid LAD 11/10/2014  . Chronic systolic heart failure ([place])   . Hay fever    hay fever, allergies  . History of echocardiogram    Echo 3/17: EF 40%, diff HK, trivial AI, trivial MR, mod LAE, mild reduced RVSF, mod RAE  . Hyperlipidemia   . Hypertension   . Persistent atrial fibrillation (HCC)     Assessment: 45 YOM with hx Afib who presented on 10/30 for planned Tikosyn initiation. The patient's INR on admission was 3.7. He has converted to NSR but due to Qtc prolongation tikosyn has been d/c. Plans noted for amiodarone once Qtc normalized.  -INR= 2.61 (INR down after two days of reduced dosing)  Home dosing: The patient was seen and evaluated by the anticoagulation clinic 10/30 and the plan was to take a 2.5 mg dose today then to resume his home dosing of 5 mg daily. The patient's last dose was noted to be on 10/29.   Goal of Therapy:  INR 2-3 Monitor platelets by anticoagulation protocol: Yes   Plan:  -Warfarin 5mg  po today -If discharge today, would consider discharge on coumadin 5mg  po daily for the next 3 days (Fr, Sa, Su then would reduce to 2.5mg  po daily. Consider INR follow-up on Monday or Tuesday next week -With reduced coumadin dosing the  last two days I am concerned that INR will continue to trend down. Effects of amiodarone on warfarin can occur at 3-4 days but may take longer -Daily PT/INR  Hildred Laser, Pharm D 07/18/2017 8:11 AM

## 2017-07-18 NOTE — Progress Notes (Signed)
Cardioversion cancelled for today per Marinus Maw for patient in Sinus Rhythm.

## 2017-07-18 NOTE — Consult Note (Signed)
[facility] CM Primary Care Navigator  07/18/2017  Ricardo Valencia December 08, 1928 [id number]   Met with patient and wife [name]) at the bedside to identify possible discharge needs. Patient reports that he is "getting heart back in rhythm" and reports having chest pain and fatigue that had led to this admission.  Wife endorses Dr. Carolann Littler with [facility] [place] as theprimary care provider.   Patient shared using [facility] [address] to obtain medications without difficulty.   Patient states that wife is managing hismedications at home with use of "pill box" system filled weekly.    Patient reports that wife has been providing transportation to his doctors'appointments.  Per patient, wife is the primary caregiver at home.   Anticipated discharge plan is home per patient.  Patient and wifevoiced understanding to call primary care provider's officewhen he returnshome, for a post discharge follow-upwithin a week or sooner if needed.Patient letter (with PCP's contact number) was provided as theirreminder.  Explained to patient and wiferegardingTHN CM services available for health management at home but patient states that he is managing at home with wife's assistance. He mentioned about daily weight monitoring/ recording; need to comply with diet restriction, medication and follow-up with providers as needed.   Patientand wife verbalizedunderstanding to seekreferral to [facility] care managementfrom primary care provider ifdeemed necessary andappropriatefor services in the future.  [facility] care management information provided for future needs that he may have.  Patient and wife opted and verbally agreed for EMMI calls to follow-up his recovery at home.  Referral made for [facility] General calls after discharge.   For additional questions please contact:  Edwena Felty A. Dillian Feig, BSN, RN-BC [facility] PRIMARY CARE Navigator Cell: [phone]

## 2017-07-18 NOTE — Plan of Care (Signed)
Problem: Education: Goal: Knowledge of [place] [facility] Education information/materials will improve Outcome: Progressing Patient aware of plan of care.  RN provided medication education on all medications prior to administration.  Patient stated understanding.  Patient denying pain.  RN instructed patient to notify RN if patient started to experience any pain.  Patient agreeable to RN instruction.

## 2017-07-19 LAB — BASIC METABOLIC PANEL
ANION GAP: 7 (ref 5–15)
BUN: 18 mg/dL (ref 6–20)
CALCIUM: 8.2 mg/dL — AB (ref 8.9–10.3)
CO2: 27 mmol/L (ref 22–32)
CREATININE: 1.43 mg/dL — AB (ref 0.61–1.24)
Chloride: 104 mmol/L (ref 101–111)
GFR calc Af Amer: 50 mL/min — ABNORMAL LOW (ref >60)
GFR, EST NON AFRICAN AMERICAN: 43 mL/min — AB (ref >60)
GLUCOSE: 83 mg/dL (ref 65–99)
Potassium: 4 mmol/L (ref 3.5–5.1)
Sodium: 138 mmol/L (ref 135–145)

## 2017-07-19 LAB — MAGNESIUM: MAGNESIUM: 1.9 mg/dL (ref 1.7–2.4)

## 2017-07-19 LAB — PROTIME-INR
INR: 2.22
Prothrombin Time: 24.4 seconds — ABNORMAL HIGH (ref 11.4–15.2)

## 2017-07-19 MED ORDER — AMIODARONE HCL 200 MG PO TABS: 200.0000 mg | ORAL_TABLET | Freq: Two times a day (BID) | ORAL | 1 refills | Status: DC

## 2017-07-19 MED ORDER — WARFARIN SODIUM 5 MG PO TABS: 5.0000 mg | ORAL_TABLET | Freq: Once | ORAL | Status: DC

## 2017-07-19 MED ORDER — WARFARIN SODIUM 5 MG PO TABS: ORAL_TABLET | ORAL | 1 refills | Status: DC

## 2017-07-19 MED ORDER — AMIODARONE HCL 200 MG PO TABS
200.0000 mg | ORAL_TABLET | Freq: Two times a day (BID) | ORAL | Status: DC
  Administered 2017-07-19: 200 mg via ORAL
  Filled 2017-07-19: qty 1

## 2017-07-19 NOTE — Progress Notes (Signed)
Patient received discharge instructions. Started on amiodarone tablet form for atrial fibrillation. Patient received first dose this morning and a handout for drug information. Peripheral IV removed. Patient verbalized understanding, and reiterated information to wife. Escorted out via wheelchair by volunteer.

## 2017-07-19 NOTE — Discharge Summary (Signed)
ELECTROPHYSIOLOGY PROCEDURE DISCHARGE SUMMARY    Patient ID: Ricardo Valencia,  MRN: [id number], DOB/AGE: October 27, 1928 83 y.o.  Admit date: 07/16/2017 Discharge date: 07/19/2017  Primary Care Physician: Eulas Post, MD Primary Cardiologist: Aundra Dubin  Primary Discharge Diagnosis:  1.  Persistent atrial fibrillation status post Tikosyn loading this admission  Secondary Discharge Diagnosis:  1.  CAD 2.  Chronic systolic heart failure 3.  HTN 4.  Hyperlipidemia  No Known Allergies   Procedures This Admission:  1.  Tikosyn loading   Brief HPI: Ricardo Valencia is a 83 y.o. male with a past medical history as noted above.  They were referred to the AF clinic in the outpatient setting for treatment options of atrial fibrillation.  Risks, benefits, and alternatives to Tikosyn were reviewed with the patient who wished to proceed.    Hospital Course:  The patient was admitted and Tikosyn was initiated.  Renal function and electrolytes were followed during the hospitalization.  Their QTc lengthened and dose was decreased. He converted to SR but unfortunately had significant QT prolongation and Tikosyn was discontinued. After washout, QT normalized. Dr Curt Bears discussed starting amiodarone 200mg  twice daily for 1 month then decreasing to once daily. Warfarin dose has been adjusted per pharmacy. Follow up with AF clinic next week as well as INR check.     Physical Exam: Vitals:   07/18/17 0521 07/18/17 1529 07/18/17 2144 07/19/17 0500  BP: 107/64 130/60 132/71 (!) 117/58  Pulse: (!) 56  66 (!) 53  Resp: 20   16  Temp: 98 F (36.7 C) 98.6 F (37 C)  97.8 F (36.6 C)  TempSrc: Oral Oral  Oral  SpO2: 97% 99%  (!) 54%  Weight: 190 lb 14.4 oz (86.6 kg)     Height:        GEN- The patient is elderly appearing, alert and oriented x 3 today.   HEENT: normocephalic, atraumatic; sclera clear, conjunctiva pink; hearing intact; oropharynx clear; neck supple  Lungs- Clear to  ausculation bilaterally, normal work of breathing.  No wheezes, rales, rhonchi Heart- Regular rate and rhythm  GI- soft, non-tender, non-distended, bowel sounds present  Extremities- no clubbing, cyanosis, or edema  MS- no significant deformity or atrophy Skin- warm and dry, no rash or lesion Psych- euthymic mood, full affect Neuro- strength and sensation are intact   Labs:   Lab Results  Component Value Date   WBC 7.6 06/24/2016   HGB 15.2 06/24/2016   HCT 41.3 06/24/2016   MCV 97.6 06/24/2016   PLT 133 (L) 06/24/2016     Recent Labs Lab 07/19/17 0422  NA 138  K 4.0  CL 104  CO2 27  BUN 18  CREATININE 1.43*  CALCIUM 8.2*  GLUCOSE 83     Discharge Medications:  Allergies as of 07/19/2017   No Known Allergies     Medication List    TAKE these medications   acetaminophen 500 MG tablet Commonly known as:  TYLENOL Take 500 mg by mouth every 4 (four) hours as needed for fever (pain).   amiodarone 200 MG tablet Commonly known as:  PACERONE Take 1 tablet (200 mg total) by mouth 2 (two) times daily.   atorvastatin 40 MG tablet Commonly known as:  LIPITOR TAKE ONE TABLET BY MOUTH ONCE DAILY AT  6PM What changed:  See the new instructions.   furosemide 40 MG tablet Commonly known as:  LASIX Take 1 tablet (40 mg total) by mouth daily. What changed:  when to take this   gabapentin 300 MG capsule Commonly known as:  NEURONTIN Take 1 capsule (300 mg total) by mouth 2 (two) times daily. What changed:  when to take this   lisinopril 5 MG tablet Commonly known as:  PRINIVIL,ZESTRIL TAKE 1 TABLET BY MOUTH ONCE DAILY What changed:  See the new instructions.   metoprolol succinate 50 MG 24 hr tablet Commonly known as:  TOPROL-XL Take 75 mg (1.5 tabs), twice a day. Take with or immediately following a meal.   nitroGLYCERIN 0.4 MG SL tablet Commonly known as:  NITROSTAT Place 1 tablet (0.4 mg total) under the tongue every 5 (five) minutes x 3 doses as needed for  chest pain.   potassium chloride 10 MEQ tablet Commonly known as:  K-DUR,KLOR-CON Take 1 tablet (10 mEq total) by mouth daily.   vitamin C 1000 MG tablet Take 1,000 mg by mouth daily.   warfarin 5 MG tablet Commonly known as:  COUMADIN Take 5mg  today, tomorrow, and Sunday then decrease to 2.5mg  daily until seen by Coumadin Clinic 07/26/17 What changed:  See the new instructions.       Disposition:  Discharge Instructions    Diet - low sodium heart healthy    Complete by:  As directed    Increase activity slowly    Complete by:  As directed      Follow-up Information    [facility] Follow up on 07/26/2017.   Specialty:  Cardiology Why:  at 11:30AM Contact information: [address] [id number] [place] [phone]       [facility] Follow up on 07/26/2017.   Specialty:  Cardiology Why:  at 1:30PM for coumadin check  Contact information: [address], [place] [phone]          Duration of Discharge Encounter: Greater than 30 minutes including physician time.  Signed, Chanetta Marshall, NP 07/19/2017 9:16 AM    I have seen and examined this patient with Chanetta Marshall.  Agree with above, note added to reflect my findings.  On exam, RRR, no murmurs, lungs clear.  Patient admitted to the hospital for [facility] initiation.  Was initially started on 250 mcg but the dose was reduced to 125 mcg.  Did convert to sinus rhythm.  Unfortunately his QTC in sinus rhythm was 435 ms.  Tikosyn was stopped.  He was watched overnight and his QTC has since reduced.  We will plan to start him on amiodarone.  Will [name] 07/19/2017 10:07 AM

## 2017-07-19 NOTE — Progress Notes (Signed)
[facility] for Warfarin Indication: atrial fibrillation  No Known Allergies  Patient Measurements: Height: 5\' 9"  (175.3 cm) Weight: 190 lb 14.4 oz (86.6 kg) IBW/kg (Calculated) : 70.7  Vital Signs: Temp: 97.8 F (36.6 C) (11/02 0500) Temp Source: Oral (11/02 0500) BP: 117/58 (11/02 0500) Pulse Rate: 53 (11/02 0500)  Labs:  Recent Labs  07/17/17 0340 07/18/17 0428 07/19/17 0422  LABPROT 31.1* 27.7* 24.4*  INR 3.02 2.61 2.22  CREATININE 1.51* 1.53* 1.43*    Estimated Creatinine Clearance: 41.2 mL/min (A) (by C-G formula based on SCr of 1.43 mg/dL (H)).   Medical History: Past Medical History:  Diagnosis Date  . CAD (coronary artery disease)    DES to mid LAD 11/10/2014  . Chronic systolic heart failure ([place])   . Hay fever    hay fever, allergies  . History of echocardiogram    Echo 3/17: EF 40%, diff HK, trivial AI, trivial MR, mod LAE, mild reduced RVSF, mod RAE  . Hyperlipidemia   . Hypertension   . Persistent atrial fibrillation ([facility])     Assessment: 34 YOM with hx Afib who presented on 10/30 for planned Tikosyn initiation. The patient's INR on admission was 3.7. He has converted to NSR but due to Qtc prolongation tikosyn has been d/c. Plans noted for amiodarone once Qtc normalized.  -INR= 2.2 (INR trend down)  Home dosing: The patient was seen and evaluated by the anticoagulation clinic 10/30 and the plan was to take a 2.5 mg dose today then to resume his home dosing of 5 mg daily. The patient's last dose was noted to be on 10/29.   Goal of Therapy:  INR 2-3 Monitor platelets by anticoagulation protocol: Yes   Plan:  -Warfarin 5mg  po today -If discharge on amiodarone today, would consider discharge on coumadin 5mg  po daily for the next 3 days (Sa, Sa, Mon then would reduce to 2.5mg  po daily. Consider INR follow-up on Tuesday/Wednesday next week -With reduced coumadin dosing 10/30 and 10/31,  I am concerned that INR will  continue to trend down. Effects of amiodarone on warfarin can occur at 3-4 days but may take longer -Daily PT/INR  Hildred Laser, Pharm D 07/19/2017 8:09 AM

## 2017-07-19 NOTE — Discharge Instructions (Signed)

## 2017-07-26 ENCOUNTER — Ambulatory Visit (HOSPITAL_COMMUNITY)
Admission: RE | Admit: 2017-07-26 | Discharge: 2017-07-26 | Disposition: A | Discharge status: Home / Self Care | Payer: Medicare Other | Source: Ambulatory Visit | Attending: Nurse Practitioner | Admitting: Nurse Practitioner

## 2017-07-26 ENCOUNTER — Encounter (HOSPITAL_COMMUNITY): Payer: Self-pay | Admitting: Nurse Practitioner

## 2017-07-26 ENCOUNTER — Telehealth: Payer: Self-pay | Admitting: *Deleted

## 2017-07-26 ENCOUNTER — Ambulatory Visit (INDEPENDENT_AMBULATORY_CARE_PROVIDER_SITE_OTHER): Payer: Medicare Other | Admitting: *Deleted

## 2017-07-26 VITALS — BP 108/62 | HR 57 | Ht 69.0 in | Wt 189.0 lb

## 2017-07-26 DIAGNOSIS — I481 Persistent atrial fibrillation: Secondary | ICD-10-CM | POA: Insufficient documentation

## 2017-07-26 DIAGNOSIS — Z5181 Encounter for therapeutic drug level monitoring: Secondary | ICD-10-CM

## 2017-07-26 DIAGNOSIS — I4819 Other persistent atrial fibrillation: Secondary | ICD-10-CM

## 2017-07-26 DIAGNOSIS — Z9889 Other specified postprocedural states: Secondary | ICD-10-CM | POA: Insufficient documentation

## 2017-07-26 DIAGNOSIS — E785 Hyperlipidemia, unspecified: Secondary | ICD-10-CM | POA: Insufficient documentation

## 2017-07-26 DIAGNOSIS — Z7901 Long term (current) use of anticoagulants: Secondary | ICD-10-CM | POA: Diagnosis not present

## 2017-07-26 DIAGNOSIS — I5022 Chronic systolic (congestive) heart failure: Secondary | ICD-10-CM | POA: Insufficient documentation

## 2017-07-26 DIAGNOSIS — Z79899 Other long term (current) drug therapy: Secondary | ICD-10-CM | POA: Diagnosis not present

## 2017-07-26 DIAGNOSIS — I110 Hypertensive heart disease with heart failure: Secondary | ICD-10-CM | POA: Diagnosis not present

## 2017-07-26 DIAGNOSIS — Z87891 Personal history of nicotine dependence: Secondary | ICD-10-CM | POA: Insufficient documentation

## 2017-07-26 LAB — HEPATIC FUNCTION PANEL
ALT: 18 U/L (ref 17–63)
AST: 25 U/L (ref 15–41)
Albumin: 3.5 g/dL (ref 3.5–5.0)
Alkaline Phosphatase: 84 U/L (ref 38–126)
BILIRUBIN DIRECT: 0.3 mg/dL (ref 0.1–0.5)
Indirect Bilirubin: 1.1 mg/dL — ABNORMAL HIGH (ref 0.3–0.9)
TOTAL PROTEIN: 7 g/dL (ref 6.5–8.1)
Total Bilirubin: 1.4 mg/dL — ABNORMAL HIGH (ref 0.3–1.2)

## 2017-07-26 LAB — TSH: TSH: 5.675 u[IU]/mL — AB (ref 0.350–4.500)

## 2017-07-26 LAB — POCT INR: INR: 1.7

## 2017-07-26 LAB — T4, FREE: Free T4: 1.02 ng/dL (ref 0.61–1.12)

## 2017-07-26 NOTE — Progress Notes (Signed)
Primary Care Physician: Eulas Post, MD Referring Physician:Dr. Harrold Fitchett is a 83 y.o. male with a h/o persistent since 2016, CAD and chronic systolic CHF, that is in the afib clinic f/u for recent hospitalization for Tikosyn loading. However, qt prolonged and drug was stopped. It did however, convert pt to SR before it was stopped. He was then placed on amiodarone to load 200 mg bid x 4 weeks.   F/u in afib clinic, following d/c 11/2. He  Continues loading on amiodarone at 200 mg bid. He will change dose to 200 mg daily on 11/30. He continues to be in SR and feels improved with less shortness of breath and more energy. INR is pending today and will need to be check weekly until INR stabilizes on amiodarone. Will alert coumadin clinic as he has appointment today. Last INR 2.2 11/2.  Today, he denies symptoms of palpitations, chest pain, shortness of breath, orthopnea, PND, lower extremity edema, dizziness, presyncope, syncope, or neurologic sequela. The patient is tolerating medications without difficulties and is otherwise without complaint today.   Past Medical History:  Diagnosis Date  . CAD (coronary artery disease)    DES to mid LAD 11/10/2014  . Chronic systolic heart failure ([place])   . Hay fever    hay fever, allergies  . History of echocardiogram    Echo 3/17: EF 40%, diff HK, trivial AI, trivial MR, mod LAE, mild reduced RVSF, mod RAE  . Hyperlipidemia   . Hypertension   . Persistent atrial fibrillation [facility])    Past Surgical History:  Procedure Laterality Date  . APPENDECTOMY  1947  . CORONARY ANGIOPLASTY WITH STENT PLACEMENT      Current Outpatient Medications  Medication Sig Dispense Refill  . acetaminophen (TYLENOL) 500 MG tablet Take 500 mg by mouth every 4 (four) hours as needed for fever (pain).    Marland Kitchen amiodarone (PACERONE) 200 MG tablet Take 1 tablet (200 mg total) by mouth 2 (two) times daily. 60 tablet 1  . Ascorbic Acid (VITAMIN C) 1000  MG tablet Take 1,000 mg by mouth daily.    Marland Kitchen atorvastatin (LIPITOR) 40 MG tablet TAKE ONE TABLET BY MOUTH ONCE DAILY AT  6PM (Patient taking differently: TAKE ONE TABLET (40mg ) BY MOUTH ONCE DAILY AT  6PM) 90 tablet 3  . furosemide (LASIX) 40 MG tablet Take 1 tablet (40 mg total) by mouth daily. 30 tablet 0  . gabapentin (NEURONTIN) 300 MG capsule Take 1 capsule (300 mg total) by mouth 2 (two) times daily. (Patient taking differently: Take 300 mg by mouth at bedtime. ) 180 capsule 3  . lisinopril (PRINIVIL,ZESTRIL) 5 MG tablet TAKE 1 TABLET BY MOUTH ONCE DAILY (Patient taking differently: TAKE 1 TABLET (5mg ) BY MOUTH ONCE DAILY) 90 tablet 3  . metoprolol succinate (TOPROL-XL) 50 MG 24 hr tablet Take 75 mg (1.5 tabs), twice a day. Take with or immediately following a meal. 90 tablet 3  . nitroGLYCERIN (NITROSTAT) 0.4 MG SL tablet Place 1 tablet (0.4 mg total) under the tongue every 5 (five) minutes x 3 doses as needed for chest pain. 25 tablet 2  . potassium chloride SA (K-DUR,KLOR-CON) 10 MEQ tablet Take 1 tablet (10 mEq total) by mouth daily. 30 tablet 0  . warfarin (COUMADIN) 5 MG tablet Take 5mg  today, tomorrow, and Sunday then decrease to 2.5mg  daily until seen by Coumadin Clinic 07/26/17 105 tablet 1   No current facility-administered medications for this encounter.  No Known Allergies  Social History   Socioeconomic History  . Marital status: Married    Spouse name: Not on file  . Number of children: Not on file  . Years of education: Not on file  . Highest education level: Not on file  Social Needs  . Financial resource strain: Not on file  . Food insecurity - worry: Not on file  . Food insecurity - inability: Not on file  . Transportation needs - medical: Not on file  . Transportation needs - non-medical: Not on file  Occupational History  . Not on file  Tobacco Use  . Smoking status: Former Smoker    Years: 2.00    Types: Cigarettes  . Smokeless tobacco: Never Used  .  Tobacco comment: "smoked when I was 22"  Substance and Sexual Activity  . Alcohol use: No    Alcohol/week: 0.0 oz  . Drug use: No  . Sexual activity: Not Currently  Other Topics Concern  . Not on file  Social History Narrative  . Not on file    Family History  Problem Relation Age of Onset  . Cancer Mother        breast  . Cancer Father        colon    ROS- All systems are reviewed and negative except as per the HPI above  Physical Exam: Vitals:   07/26/17 1130  BP: 108/62  Pulse: (!) 57  Weight: 189 lb (85.7 kg)  Height: 5\' 9"  (1.753 m)   Wt Readings from Last 3 Encounters:  07/26/17 189 lb (85.7 kg)  07/18/17 190 lb 14.4 oz (86.6 kg)  07/16/17 190 lb 9.6 oz (86.5 kg)    Labs: Lab Results  Component Value Date   NA 138 07/19/2017   K 4.0 07/19/2017   CL 104 07/19/2017   CO2 27 07/19/2017   GLUCOSE 83 07/19/2017   BUN 18 07/19/2017   CREATININE 1.43 (H) 07/19/2017   CALCIUM 8.2 (L) 07/19/2017   MG 1.9 07/19/2017   Lab Results  Component Value Date   INR 2.22 07/19/2017   Lab Results  Component Value Date   CHOL 150 02/15/2016   HDL 66 02/15/2016   LDLCALC 64 02/15/2016   TRIG 98 02/15/2016     GEN- The patient is well appearing, alert and oriented x 3 today.   Head- normocephalic, atraumatic Eyes-  Sclera clear, conjunctiva pink Ears- hearing intact Oropharynx- clear Neck- supple, no JVP Lymph- no cervical lymphadenopathy Lungs- Clear to ausculation bilaterally, normal work of breathing Heart-regular rate and rhythm, no murmurs, rubs or gallops, PMI not laterally displaced GI- soft, NT, ND, + BS Extremities- no clubbing, cyanosis, or edema MS- no significant deformity or atrophy Skin- no rash or lesion Psych- euthymic mood, full affect Neuro- strength and sensation are intact  EKG- Sinus brady at 57 bpm, Pr int 242 ms, qrs int 82 ms, qtc 484 ms (stable)    Assessment and Plan: 1. Symptomatic persistent afib Now in SR, feels  improved Failed tikosyn 2/2 long qt, loading on amiodarone 200 mg bid Will reduce dose to 200 mg daily 11/30 Baseline TSH/liver today Baseline PFT's scheduled today INR pending today and coumadin clinic made aware that amiodarone was added, so INR's can be checked more frequently for the next several weeks  2. Chronic systolic heart failure Stable  Weight is down one lb  3. HTN  Stable   F/u with Dr. Aundra Dubin 12/13 Afib clinic as needed  Butch Penny  Eulah Pont, [facility] [address] [place], [place] [address] [phone]

## 2017-07-26 NOTE — Telephone Encounter (Signed)
-----   Message from Juluis Mire, RN sent at 07/26/2017 11:50 AM EST ----- Regarding: amiodarone Pt was started on amiodarone last week he will continue on 200mg  bid until 11/30 then reduce to once a day. Thanks! [facility]

## 2017-07-26 NOTE — Patient Instructions (Addendum)
Decrease Amiodarone 200mg  once a day on November 30th   For the lung testing No smoking, inhalers or caffeine 4 hours prior to test. You may eat and drink as normal (other than caffeine) Go to main entrance and ask to be taken to respiratory.

## 2017-07-26 NOTE — Telephone Encounter (Signed)
Noted this in pt encounter of today's visit

## 2017-07-26 NOTE — Progress Notes (Addendum)
Change  coumadin dose to 5mg  daily except 2.5mg  on Sundays and Wednesdays Recheck INR in  1 week Started Amiodarone on 07/19/2017 200 mg bid until 08/16/2017 then daily

## 2017-07-27 LAB — T3, FREE: T3 FREE: 2.3 pg/mL (ref 2.0–4.4)

## 2017-07-30 ENCOUNTER — Encounter: Payer: Self-pay | Admitting: Family Medicine

## 2017-08-02 ENCOUNTER — Ambulatory Visit (INDEPENDENT_AMBULATORY_CARE_PROVIDER_SITE_OTHER): Payer: Medicare Other | Admitting: *Deleted

## 2017-08-02 DIAGNOSIS — Z5181 Encounter for therapeutic drug level monitoring: Secondary | ICD-10-CM | POA: Diagnosis not present

## 2017-08-02 DIAGNOSIS — Z7901 Long term (current) use of anticoagulants: Secondary | ICD-10-CM

## 2017-08-02 DIAGNOSIS — I4891 Unspecified atrial fibrillation: Secondary | ICD-10-CM

## 2017-08-02 LAB — POCT INR: INR: 2.4

## 2017-08-02 NOTE — Patient Instructions (Signed)
Continue taking  5mg  daily except 2.5mg  on Sundays and Wednesdays Recheck INR in 10 days.

## 2017-08-13 ENCOUNTER — Ambulatory Visit (INDEPENDENT_AMBULATORY_CARE_PROVIDER_SITE_OTHER): Payer: Medicare Other | Admitting: *Deleted

## 2017-08-13 ENCOUNTER — Ambulatory Visit (HOSPITAL_COMMUNITY)
Admission: RE | Admit: 2017-08-13 | Discharge: 2017-08-13 | Disposition: A | Discharge status: Home / Self Care | Payer: Medicare Other | Source: Ambulatory Visit | Attending: Nurse Practitioner | Admitting: Nurse Practitioner

## 2017-08-13 DIAGNOSIS — I4891 Unspecified atrial fibrillation: Secondary | ICD-10-CM | POA: Diagnosis not present

## 2017-08-13 DIAGNOSIS — Z5181 Encounter for therapeutic drug level monitoring: Secondary | ICD-10-CM | POA: Diagnosis not present

## 2017-08-13 DIAGNOSIS — I481 Persistent atrial fibrillation: Secondary | ICD-10-CM | POA: Diagnosis not present

## 2017-08-13 DIAGNOSIS — Z7901 Long term (current) use of anticoagulants: Secondary | ICD-10-CM

## 2017-08-13 DIAGNOSIS — I4819 Other persistent atrial fibrillation: Secondary | ICD-10-CM

## 2017-08-13 DIAGNOSIS — J449 Chronic obstructive pulmonary disease, unspecified: Secondary | ICD-10-CM | POA: Diagnosis not present

## 2017-08-13 LAB — PULMONARY FUNCTION TEST
DL/VA % pred: 77 %
DL/VA: 3.42 ml/min/mmHg/L
DLCO UNC % PRED: 56 %
DLCO UNC: 16.88 ml/min/mmHg
FEF 25-75 Pre: 0.76 L/sec
FEF2575-%Pred-Pre: 51 %
FEV1-%Pred-Pre: 60 %
FEV1-Pre: 1.42 L
FEV1FVC-%Pred-Pre: 94 %
FEV6-%PRED-PRE: 67 %
FEV6-Pre: 2.13 L
FEV6FVC-%PRED-PRE: 107 %
FVC-%PRED-PRE: 62 %
FVC-Pre: 2.15 L
PRE FEV6/FVC RATIO: 99 %
Pre FEV1/FVC ratio: 66 %

## 2017-08-13 LAB — POCT INR: INR: 5.1

## 2017-08-13 NOTE — Patient Instructions (Signed)
Skip today and tomorrow's dose, then change your dose to 1/2 tablet daily except 1 tablet on Mondays, Wednesdays and Fridays.  Recheck INR in 10 days.  Started Amiodarone on 07/19/2017 200mg  bid  until 08/16/2017 then daily.

## 2017-08-22 ENCOUNTER — Ambulatory Visit (INDEPENDENT_AMBULATORY_CARE_PROVIDER_SITE_OTHER): Payer: Medicare Other | Admitting: Pharmacist

## 2017-08-22 DIAGNOSIS — Z7901 Long term (current) use of anticoagulants: Secondary | ICD-10-CM

## 2017-08-22 DIAGNOSIS — Z5181 Encounter for therapeutic drug level monitoring: Secondary | ICD-10-CM | POA: Diagnosis not present

## 2017-08-22 LAB — POCT INR: INR: 2.3

## 2017-08-22 NOTE — Patient Instructions (Signed)
Continue taking 1/2 tablet daily except 1 tablet on Mondays, Wednesdays and Fridays.  Recheck INR in 10 days.  Started Amiodarone on 07/19/2017 200mg  twice daily until 08/16/2017 then daily.

## 2017-08-29 ENCOUNTER — Encounter (HOSPITAL_COMMUNITY): Payer: Self-pay | Admitting: Cardiology

## 2017-08-29 ENCOUNTER — Ambulatory Visit (HOSPITAL_COMMUNITY)
Admission: RE | Admit: 2017-08-29 | Discharge: 2017-08-29 | Disposition: A | Discharge status: Home / Self Care | Payer: Medicare Other | Source: Ambulatory Visit | Attending: Cardiology | Admitting: Cardiology

## 2017-08-29 VITALS — BP 128/57 | HR 48 | Wt 190.0 lb

## 2017-08-29 DIAGNOSIS — R946 Abnormal results of thyroid function studies: Secondary | ICD-10-CM | POA: Insufficient documentation

## 2017-08-29 DIAGNOSIS — Z79899 Other long term (current) drug therapy: Secondary | ICD-10-CM | POA: Diagnosis not present

## 2017-08-29 DIAGNOSIS — I130 Hypertensive heart and chronic kidney disease with heart failure and stage 1 through stage 4 chronic kidney disease, or unspecified chronic kidney disease: Secondary | ICD-10-CM | POA: Insufficient documentation

## 2017-08-29 DIAGNOSIS — Z7901 Long term (current) use of anticoagulants: Secondary | ICD-10-CM | POA: Diagnosis not present

## 2017-08-29 DIAGNOSIS — R001 Bradycardia, unspecified: Secondary | ICD-10-CM | POA: Insufficient documentation

## 2017-08-29 DIAGNOSIS — E785 Hyperlipidemia, unspecified: Secondary | ICD-10-CM | POA: Insufficient documentation

## 2017-08-29 DIAGNOSIS — Z87891 Personal history of nicotine dependence: Secondary | ICD-10-CM | POA: Diagnosis not present

## 2017-08-29 DIAGNOSIS — I480 Paroxysmal atrial fibrillation: Secondary | ICD-10-CM

## 2017-08-29 DIAGNOSIS — I4581 Long QT syndrome: Secondary | ICD-10-CM | POA: Diagnosis not present

## 2017-08-29 DIAGNOSIS — I5022 Chronic systolic (congestive) heart failure: Secondary | ICD-10-CM | POA: Diagnosis not present

## 2017-08-29 DIAGNOSIS — I2510 Atherosclerotic heart disease of native coronary artery without angina pectoris: Secondary | ICD-10-CM | POA: Insufficient documentation

## 2017-08-29 MED ORDER — METOPROLOL SUCCINATE ER 50 MG PO TB24: 50.0000 mg | ORAL_TABLET | Freq: Two times a day (BID) | ORAL | 3 refills | Status: DC

## 2017-08-29 NOTE — Patient Instructions (Signed)
DECREASE Toprol to 50mg  twice daily.  Your provider requests you have a Sleep Study.  Lab work in 1 month.  Follow up with Dr.McLean in 3 months.

## 2017-08-29 NOTE — Progress Notes (Signed)
PCP: Dr. [name] Cardiology: Dr. Angelena Form HF Cardiology: Dr. Aundra Dubin  83 y.o. with history of persistent atrial fibrillation, CAD, and chronic systolic CHF returns for followup of CHF and atrial fibrillation.  Patient was admitted in 2/16 with afib/RVR.  Echo showed EF 25-30% at that time.  He had LHC, showing 90% mLAD stenosis that was treated with DES. In 4/16, he was cardioverted back to NSR.  Repeat echo in NSR showed EF 45-50%.  He was then admitted in 1/17 and again in 3/17 with afib/RVR (recurrent).  He did not have repeat DCCV. Echo in 3/17 showed EF 40% with mildly decreased RV systolic function.  He has remained in atrial fibrillation.   Last echo in 10/18 showed EF 55-60% with basal inferior akinesis, mildly dilated RV with mildly decreased RV systolic function.    He was admitted for Tikosyn initiation and converted to NSR, but Tikosyn had to be stopped because of prolonged QT interval. Amiodarone was started after; he is still on amiodarone today.   Patient returns for followup of CHF and atrial fibrillation.  He is in sinus bradycardia today.  He reports more energy in NSR.  He is not short of breath walking on flat ground.  He notes dyspnea walking up stairs with groceries.  No chest pain. No orthopnea/PND. No lightheadedness/dizziness.  He snores and has daytime sleepiness.   Labs (5/17): LDL 64, HDL 66 Labs (7/18): K 4.3, creatinine 1.42 Labs (9/18): K 4.5, creatinine 1.46, BNP 501  Labs (11/18): K 4, creatinine 1.43, tbili 1.4, AST/ALT normal, TSH elevated but free T3 and free T4 normal.   ECG (personally reviewed): sinus brady at 69, old ASMI  PMH: 1. HTN 2. CKD stage 3 3. Hyperlipidemia 4. CAD:  - LHC 2/16 with 90% mLAD, 50% RCA => DES to mLAD.  5. 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  - Echo (2/16) with EF 25-30%, moderate MR while in afib.  - Echo (4/16) in NSR with EF 45-50%, no MR.  - Echo (3/17) in atrial fibrillation  with EF 40%, mildly decreased RV systolic function.  - Echo (10/18): EF 55-60%, basal inferior akinesis, mild LVH, mildly dilated RV with mildly decreased systolic function, mild MR. 6. Atrial fibrillation: Persistent.  - 2/16 admission for afib/RVR => DCCV in 4/16.  - 1/17 admission with afib/RVR - 3/17 admission with afib/RVR 7. PFTs (11/18): moderate obstructive airways disease.   Social History   Socioeconomic History  . Marital status: Married    Spouse name: Not on file  . Number of children: Not on file  . Years of education: Not on file  . Highest education level: Not on file  Social Needs  . Financial resource strain: Not on file  . Food insecurity - worry: Not on file  . Food insecurity - inability: Not on file  . Transportation needs - medical: Not on file  . Transportation needs - non-medical: Not on file  Occupational History  . Not on file  Tobacco Use  . Smoking status: Former Smoker    Years: 2.00    Types: Cigarettes  . Smokeless tobacco: Never Used  . Tobacco comment: "smoked when I was 22"  Substance and Sexual Activity  . Alcohol use: No    Alcohol/week: 0.0 oz  . Drug use: No  . Sexual activity: Not Currently  Other Topics Concern  . Not on file  Social History Narrative  . Not on file   Family History  Problem  Relation Age of Onset  . Cancer Mother        breast  . Cancer Father        colon   ROS: All systems reviewed and negative except as per HPI.   Current Outpatient Medications  Medication Sig Dispense Refill  . acetaminophen (TYLENOL) 500 MG tablet Take 500 mg by mouth every 4 (four) hours as needed for fever (pain).    Marland Kitchen amiodarone (PACERONE) 200 MG tablet Take 1 tablet (200 mg total) by mouth 2 (two) times daily. (Patient taking differently: Take 200 mg by mouth daily. ) 60 tablet 1  . Ascorbic Acid (VITAMIN C) 1000 MG tablet Take 1,000 mg by mouth daily.    Marland Kitchen atorvastatin (LIPITOR) 40 MG tablet TAKE ONE TABLET BY MOUTH ONCE DAILY AT   6PM (Patient taking differently: TAKE ONE TABLET (40mg ) BY MOUTH ONCE DAILY AT  6PM) 90 tablet 3  . furosemide (LASIX) 40 MG tablet Take 1 tablet (40 mg total) by mouth daily. 30 tablet 0  . gabapentin (NEURONTIN) 300 MG capsule Take 1 capsule (300 mg total) by mouth 2 (two) times daily. (Patient taking differently: Take 300 mg by mouth at bedtime. ) 180 capsule 3  . lisinopril (PRINIVIL,ZESTRIL) 5 MG tablet TAKE 1 TABLET BY MOUTH ONCE DAILY (Patient taking differently: TAKE 1 TABLET (5mg ) BY MOUTH ONCE DAILY) 90 tablet 3  . metoprolol succinate (TOPROL-XL) 50 MG 24 hr tablet Take 1 tablet (50 mg total) by mouth 2 (two) times daily. 60 tablet 3  . nitroGLYCERIN (NITROSTAT) 0.4 MG SL tablet Place 1 tablet (0.4 mg total) under the tongue every 5 (five) minutes x 3 doses as needed for chest pain. 25 tablet 2  . potassium chloride SA (K-DUR,KLOR-CON) 10 MEQ tablet Take 1 tablet (10 mEq total) by mouth daily. 30 tablet 0  . warfarin (COUMADIN) 5 MG tablet Take 5mg  today, tomorrow, and Sunday then decrease to 2.5mg  daily until seen by Coumadin Clinic 07/26/17 105 tablet 1   No current facility-administered medications for this encounter.    BP (!) 128/57   Pulse (!) 48   Wt 190 lb (86.2 kg)   SpO2 100%   BMI 28.06 kg/m  General: NAD Neck: No JVD, no thyromegaly or thyroid nodule.  Lungs: Slight dry crackles at bases bilaterally CV: Nondisplaced PMI.  Heart bradycardic, regular S1/S2, no S3/S4, no murmur.  No peripheral edema.  No carotid bruit.  Normal pedal pulses.  Abdomen: Soft, nontender, no hepatosplenomegaly, no distention.  Skin: Intact without lesions or rashes.  Neurologic: Alert and oriented x 3.  Psych: Normal affect. Extremities: No clubbing or cyanosis.  HEENT: Normal.    Assessment/Plan: 1. 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  EF has fallen when he has been in atrial fibrillation. However, echo in 10/18 showed  EF up to 55-60%.  Currently, NYHA class II symptoms.  He is not volume overloaded on exam. He feels better in NSR.  - With bradycardia and EF now normal, decrease Toprol XL to 50 mg bid.    - He will continue lisinopril 5 mg daily.  2. Atrial fibrillation: Paroxysmal.  He was unable to continue Tikosyn due to prolonged QT interval.  He is now on amiodarone and in NSR.  - Continue amiodarone.  Last LFTs normal.  TSH mildly elevated but free T3 and T4 normal.   Repeat LFTs and TSH/free T3/free T4 in 1 month.  He will need a regular eye exam.  -  Continue warfarin.   3. CKD: Stage 3.   4. CAD: DES to LAD in 2/16.  No chest pain.  - No ASA given stable CAD and warfarin use.  - Continue atorvastatin 40 daily.  Check lipids today.  5. Suspect OSA: Based on wife's description of his sleep pattern, I suspect he has OSA.  This may help drive atrial fibrillation.  I will arrange for a sleep study, he is agreeable to this.    Loralie Champagne 08/29/2017

## 2017-09-12 ENCOUNTER — Ambulatory Visit (INDEPENDENT_AMBULATORY_CARE_PROVIDER_SITE_OTHER): Payer: Medicare Other | Admitting: *Deleted

## 2017-09-12 DIAGNOSIS — Z7901 Long term (current) use of anticoagulants: Secondary | ICD-10-CM

## 2017-09-12 DIAGNOSIS — Z5181 Encounter for therapeutic drug level monitoring: Secondary | ICD-10-CM

## 2017-09-12 LAB — POCT INR: INR: 2.8

## 2017-09-12 NOTE — Patient Instructions (Signed)
Description   Continue taking 1/2 tablet daily except 1 tablet on Mondays, Wednesdays and Fridays.  Recheck INR in 4 weeks. Call us with any medication changes or concern [phone] Coumadin Clinic, Main # [phone].

## 2017-09-19 ENCOUNTER — Encounter (HOSPITAL_COMMUNITY): Payer: Self-pay | Admitting: Cardiology

## 2017-09-29 ENCOUNTER — Encounter (HOSPITAL_BASED_OUTPATIENT_CLINIC_OR_DEPARTMENT_OTHER): Payer: Medicare Other

## 2017-10-01 ENCOUNTER — Other Ambulatory Visit (HOSPITAL_COMMUNITY): Payer: Self-pay | Admitting: *Deleted

## 2017-10-01 ENCOUNTER — Ambulatory Visit (HOSPITAL_COMMUNITY)
Admission: RE | Admit: 2017-10-01 | Discharge: 2017-10-01 | Disposition: A | Discharge status: Home / Self Care | Payer: Medicare Other | Source: Ambulatory Visit | Attending: Cardiology | Admitting: Cardiology

## 2017-10-01 DIAGNOSIS — I5022 Chronic systolic (congestive) heart failure: Secondary | ICD-10-CM

## 2017-10-01 LAB — COMPREHENSIVE METABOLIC PANEL
ALBUMIN: 3.5 g/dL (ref 3.5–5.0)
ALT: 15 U/L — AB (ref 17–63)
AST: 25 U/L (ref 15–41)
Alkaline Phosphatase: 83 U/L (ref 38–126)
Anion gap: 8 (ref 5–15)
BILIRUBIN TOTAL: 1 mg/dL (ref 0.3–1.2)
BUN: 14 mg/dL (ref 6–20)
CO2: 26 mmol/L (ref 22–32)
CREATININE: 1.57 mg/dL — AB (ref 0.61–1.24)
Calcium: 8.4 mg/dL — ABNORMAL LOW (ref 8.9–10.3)
Chloride: 104 mmol/L (ref 101–111)
GFR calc Af Amer: 44 mL/min — ABNORMAL LOW (ref >60)
GFR, EST NON AFRICAN AMERICAN: 38 mL/min — AB (ref >60)
GLUCOSE: 99 mg/dL (ref 65–99)
POTASSIUM: 4.2 mmol/L (ref 3.5–5.1)
Sodium: 138 mmol/L (ref 135–145)
TOTAL PROTEIN: 6.6 g/dL (ref 6.5–8.1)

## 2017-10-01 LAB — LIPID PANEL
CHOLESTEROL: 178 mg/dL (ref 0–200)
HDL: 63 mg/dL (ref >40)
LDL Cholesterol: 93 mg/dL (ref 0–99)
TRIGLYCERIDES: 108 mg/dL (ref <150)
Total CHOL/HDL Ratio: 2.8 RATIO
VLDL: 22 mg/dL (ref 0–40)

## 2017-10-01 LAB — CBC
HEMATOCRIT: 39.9 % (ref 39.0–52.0)
HEMOGLOBIN: 13.5 g/dL (ref 13.0–17.0)
MCH: 31.8 pg (ref 26.0–34.0)
MCHC: 33.8 g/dL (ref 30.0–36.0)
MCV: 94.1 fL (ref 78.0–100.0)
Platelets: 148 10*3/uL — ABNORMAL LOW (ref 150–400)
RBC: 4.24 MIL/uL (ref 4.22–5.81)
RDW: 14.6 % (ref 11.5–15.5)
WBC: 6.5 10*3/uL (ref 4.0–10.5)

## 2017-10-01 LAB — T4, FREE: FREE T4: 0.79 ng/dL (ref 0.61–1.12)

## 2017-10-01 LAB — TSH: TSH: 9.515 u[IU]/mL — ABNORMAL HIGH (ref 0.350–4.500)

## 2017-10-02 LAB — T3, FREE: T3, Free: 2.1 pg/mL (ref 2.0–4.4)

## 2017-10-04 ENCOUNTER — Telehealth (HOSPITAL_COMMUNITY): Payer: Self-pay

## 2017-10-04 DIAGNOSIS — E785 Hyperlipidemia, unspecified: Secondary | ICD-10-CM

## 2017-10-04 MED ORDER — ATORVASTATIN CALCIUM 80 MG PO TABS: 80.0000 mg | ORAL_TABLET | Freq: Every day | ORAL | 3 refills | Status: DC

## 2017-10-04 NOTE — Telephone Encounter (Signed)
Notes recorded by Shirley Muscat, RN on 10/04/2017 at 9:35 AM EST Pt aware of result, agreeable to med changes (changes made in [facility]), and will have labs at f/u appt. Lipid Letter mailed to patient  ------  Notes recorded by Larey Dresser, MD on 10/02/2017 at 8:57 PM EST LDL above goal of < 70, would increase atorvastatin to 80 mg daily. Lipids/LFTs in 2 months .

## 2017-10-10 ENCOUNTER — Ambulatory Visit (INDEPENDENT_AMBULATORY_CARE_PROVIDER_SITE_OTHER): Payer: Medicare Other | Admitting: *Deleted

## 2017-10-10 DIAGNOSIS — Z7901 Long term (current) use of anticoagulants: Secondary | ICD-10-CM

## 2017-10-10 DIAGNOSIS — Z5181 Encounter for therapeutic drug level monitoring: Secondary | ICD-10-CM | POA: Diagnosis not present

## 2017-10-10 DIAGNOSIS — I4891 Unspecified atrial fibrillation: Secondary | ICD-10-CM | POA: Diagnosis not present

## 2017-10-10 LAB — POCT INR: INR: 2.4

## 2017-10-10 NOTE — Patient Instructions (Signed)
Description   Continue taking 1/2 tablet daily except 1 tablet on Mondays, Wednesdays and Fridays.  Recheck INR in 5 weeks. Call us with any medication changes or concern [phone] Coumadin Clinic, Main # [phone].

## 2017-10-14 ENCOUNTER — Other Ambulatory Visit: Payer: Self-pay | Admitting: Nurse Practitioner

## 2017-10-14 NOTE — Telephone Encounter (Signed)
This is a CHF pt 

## 2017-10-30 ENCOUNTER — Other Ambulatory Visit (HOSPITAL_COMMUNITY): Payer: Self-pay | Admitting: *Deleted

## 2017-10-30 MED ORDER — AMIODARONE HCL 200 MG PO TABS: 200.0000 mg | ORAL_TABLET | Freq: Every day | ORAL | 3 refills | Status: DC

## 2017-11-13 ENCOUNTER — Other Ambulatory Visit: Payer: Self-pay | Admitting: Cardiovascular Disease

## 2017-11-14 ENCOUNTER — Ambulatory Visit (INDEPENDENT_AMBULATORY_CARE_PROVIDER_SITE_OTHER): Payer: Medicare Other | Admitting: Pharmacist

## 2017-11-14 DIAGNOSIS — Z7901 Long term (current) use of anticoagulants: Secondary | ICD-10-CM

## 2017-11-14 DIAGNOSIS — Z5181 Encounter for therapeutic drug level monitoring: Secondary | ICD-10-CM

## 2017-11-14 LAB — POCT INR: INR: 4.5

## 2017-11-14 NOTE — Patient Instructions (Signed)
Description   No Couamdin tonight then half tablet tomorrow, continue taking 1/2 tablet daily except 1 tablet on Mondays, Wednesdays and Fridays.  Recheck INR in 2 weeks. Call us with any medication changes or concern [phone] Coumadin Clinic, Main # [phone].

## 2017-11-28 ENCOUNTER — Ambulatory Visit (INDEPENDENT_AMBULATORY_CARE_PROVIDER_SITE_OTHER): Payer: Medicare Other | Admitting: Pharmacist

## 2017-11-28 DIAGNOSIS — Z7901 Long term (current) use of anticoagulants: Secondary | ICD-10-CM | POA: Diagnosis not present

## 2017-11-28 DIAGNOSIS — Z5181 Encounter for therapeutic drug level monitoring: Secondary | ICD-10-CM | POA: Diagnosis not present

## 2017-11-28 LAB — POCT INR: INR: 2.4

## 2017-11-28 NOTE — Patient Instructions (Signed)
Description   Continue taking 1/2 tablet daily except 1 tablet on Mondays, Wednesdays and Fridays.  Recheck INR in 4 weeks. Call us with any medication changes or concern [phone] [facility], Main # [phone].

## 2017-12-03 ENCOUNTER — Other Ambulatory Visit: Payer: Self-pay

## 2017-12-03 ENCOUNTER — Encounter (HOSPITAL_COMMUNITY): Payer: Self-pay | Admitting: Cardiology

## 2017-12-03 ENCOUNTER — Ambulatory Visit (HOSPITAL_COMMUNITY)
Admission: RE | Admit: 2017-12-03 | Discharge: 2017-12-03 | Disposition: A | Discharge status: Home / Self Care | Payer: Medicare Other | Source: Ambulatory Visit | Attending: Cardiology | Admitting: Cardiology

## 2017-12-03 VITALS — BP 135/67 | HR 77 | Wt 191.8 lb

## 2017-12-03 DIAGNOSIS — I481 Persistent atrial fibrillation: Secondary | ICD-10-CM | POA: Diagnosis not present

## 2017-12-03 DIAGNOSIS — I130 Hypertensive heart and chronic kidney disease with heart failure and stage 1 through stage 4 chronic kidney disease, or unspecified chronic kidney disease: Secondary | ICD-10-CM | POA: Diagnosis not present

## 2017-12-03 DIAGNOSIS — I5022 Chronic systolic (congestive) heart failure: Secondary | ICD-10-CM | POA: Insufficient documentation

## 2017-12-03 DIAGNOSIS — I480 Paroxysmal atrial fibrillation: Secondary | ICD-10-CM | POA: Insufficient documentation

## 2017-12-03 DIAGNOSIS — Z87891 Personal history of nicotine dependence: Secondary | ICD-10-CM | POA: Diagnosis not present

## 2017-12-03 DIAGNOSIS — R001 Bradycardia, unspecified: Secondary | ICD-10-CM | POA: Diagnosis not present

## 2017-12-03 DIAGNOSIS — I2510 Atherosclerotic heart disease of native coronary artery without angina pectoris: Secondary | ICD-10-CM | POA: Insufficient documentation

## 2017-12-03 DIAGNOSIS — E785 Hyperlipidemia, unspecified: Secondary | ICD-10-CM | POA: Insufficient documentation

## 2017-12-03 DIAGNOSIS — Z79899 Other long term (current) drug therapy: Secondary | ICD-10-CM | POA: Insufficient documentation

## 2017-12-03 DIAGNOSIS — N183 Chronic kidney disease, stage 3 (moderate): Secondary | ICD-10-CM | POA: Diagnosis not present

## 2017-12-03 DIAGNOSIS — I4819 Other persistent atrial fibrillation: Secondary | ICD-10-CM

## 2017-12-03 DIAGNOSIS — I4581 Long QT syndrome: Secondary | ICD-10-CM | POA: Diagnosis not present

## 2017-12-03 DIAGNOSIS — I429 Cardiomyopathy, unspecified: Secondary | ICD-10-CM | POA: Insufficient documentation

## 2017-12-03 DIAGNOSIS — Z803 Family history of malignant neoplasm of breast: Secondary | ICD-10-CM | POA: Insufficient documentation

## 2017-12-03 DIAGNOSIS — Z7901 Long term (current) use of anticoagulants: Secondary | ICD-10-CM | POA: Diagnosis not present

## 2017-12-03 DIAGNOSIS — Z800 Family history of malignant neoplasm of digestive organs: Secondary | ICD-10-CM | POA: Insufficient documentation

## 2017-12-03 LAB — COMPREHENSIVE METABOLIC PANEL
ALT: 14 U/L — AB (ref 17–63)
AST: 23 U/L (ref 15–41)
Albumin: 3.4 g/dL — ABNORMAL LOW (ref 3.5–5.0)
Alkaline Phosphatase: 69 U/L (ref 38–126)
Anion gap: 8 (ref 5–15)
BUN: 18 mg/dL (ref 6–20)
CHLORIDE: 105 mmol/L (ref 101–111)
CO2: 27 mmol/L (ref 22–32)
CREATININE: 1.82 mg/dL — AB (ref 0.61–1.24)
Calcium: 8.4 mg/dL — ABNORMAL LOW (ref 8.9–10.3)
GFR calc Af Amer: 37 mL/min — ABNORMAL LOW (ref >60)
GFR, EST NON AFRICAN AMERICAN: 32 mL/min — AB (ref >60)
Glucose, Bld: 102 mg/dL — ABNORMAL HIGH (ref 65–99)
Potassium: 4.2 mmol/L (ref 3.5–5.1)
Sodium: 140 mmol/L (ref 135–145)
Total Bilirubin: 1 mg/dL (ref 0.3–1.2)
Total Protein: 6.5 g/dL (ref 6.5–8.1)

## 2017-12-03 LAB — T4, FREE: Free T4: 0.91 ng/dL (ref 0.61–1.12)

## 2017-12-03 LAB — TSH: TSH: 8.422 u[IU]/mL — ABNORMAL HIGH (ref 0.350–4.500)

## 2017-12-03 MED ORDER — METOPROLOL SUCCINATE ER 50 MG PO TB24: 50.0000 mg | ORAL_TABLET | Freq: Every day | ORAL | 3 refills | Status: DC

## 2017-12-03 NOTE — Progress Notes (Signed)
PCP: Dr. [name] Cardiology: Dr. Angelena Form HF Cardiology: Dr. Aundra Dubin  84 y.o. with history of persistent atrial fibrillation, CAD, and chronic systolic CHF returns for followup of CHF and atrial fibrillation.  Patient was admitted in 2/16 with afib/RVR.  Echo showed EF 25-30% at that time.  He had LHC, showing 90% mLAD stenosis that was treated with DES. In 4/16, he was cardioverted back to NSR.  Repeat echo in NSR showed EF 45-50%.  He was then admitted in 1/17 and again in 3/17 with afib/RVR (recurrent).  He did not have repeat DCCV. Echo in 3/17 showed EF 40% with mildly decreased RV systolic function.  He has remained in atrial fibrillation.   Last echo in 10/18 showed EF 55-60% with basal inferior akinesis, mildly dilated RV with mildly decreased RV systolic function.    He was admitted for Tikosyn initiation and converted to NSR, but Tikosyn had to be stopped because of prolonged QT interval. Amiodarone was started after; he is still on amiodarone today.   Patient returns for followup of CHF and atrial fibrillation.  He is in sinus bradycardia today. He is generally doing well.  No lightheadedness or falls.  No chest pain.  No significant exertional dyspnea.  No orthopnea/PND.  No BRBPR/melena.  He has not felt palpitations.  Per his wife, his sleep now "sounds better," he no longer gasps or struggles.  Therefore, he does not want to do sleep study.   Labs (5/17): LDL 64, HDL 66 Labs (7/18): K 4.3, creatinine 1.42 Labs (9/18): K 4.5, creatinine 1.46, BNP 501  Labs (11/18): K 4, creatinine 1.43, tbili 1.4, AST/ALT normal, TSH elevated but free T3 and free T4 normal.  Labs (1/19): LDL 93, HDL 63, hgb 13.5, TSH 9, free T3 and T4 normal, LFTs normal, K 4.2, creatinine 1.57  ECG (personally reviewed): sinus brady at 5, 1st degree AVB, old ASMI  PMH: 1. HTN 2. CKD stage 3 3. Hyperlipidemia 4. CAD:  - LHC 2/16 with 90% mLAD, 50% RCA => DES to mLAD.  5. Chronic systolic CHF: ?Mixed  cardiomyopathy related to CAD and tachy-mediated.  - Echo (2/16) with EF 25-30%, moderate MR while in afib.  - Echo (4/16) in NSR with EF 45-50%, no MR.  - Echo (3/17) in atrial fibrillation with EF 40%, mildly decreased RV systolic function.  - Echo (10/18): EF 55-60%, basal inferior akinesis, mild LVH, mildly dilated RV with mildly decreased systolic function, mild MR. 6. Atrial fibrillation: Persistent.  - 2/16 admission for afib/RVR => DCCV in 4/16.  - 1/17 admission with afib/RVR - 3/17 admission with afib/RVR 7. PFTs (11/18): moderate obstructive airways disease.   Social History   Socioeconomic History  . Marital status: Married    Spouse name: Not on file  . Number of children: Not on file  . Years of education: Not on file  . Highest education level: Not on file  Social Needs  . Financial resource strain: Not on file  . Food insecurity - worry: Not on file  . Food insecurity - inability: Not on file  . Transportation needs - medical: Not on file  . Transportation needs - non-medical: Not on file  Occupational History  . Not on file  Tobacco Use  . Smoking status: Former Smoker    Years: 2.00    Types: Cigarettes  . Smokeless tobacco: Never Used  . Tobacco comment: "smoked when I was 22"  Substance and Sexual Activity  . Alcohol use: No  Alcohol/week: 0.0 oz  . Drug use: No  . Sexual activity: Not Currently  Other Topics Concern  . Not on file  Social History Narrative  . Not on file   Family History  Problem Relation Age of Onset  . Cancer Mother        breast  . Cancer Father        colon   ROS: All systems reviewed and negative except as per HPI.   Current Outpatient Medications  Medication Sig Dispense Refill  . acetaminophen (TYLENOL) 500 MG tablet Take 500 mg by mouth every 4 (four) hours as needed for fever (pain).    Marland Kitchen amiodarone (PACERONE) 200 MG tablet Take 1 tablet (200 mg total) by mouth daily. 30 tablet 3  . Ascorbic Acid (VITAMIN C) 1000  MG tablet Take 1,000 mg by mouth daily.    Marland Kitchen atorvastatin (LIPITOR) 80 MG tablet Take 1 tablet (80 mg total) by mouth daily at 6 PM. 30 tablet 3  . furosemide (LASIX) 40 MG tablet Take 1 tablet (40 mg total) by mouth daily. 30 tablet 0  . gabapentin (NEURONTIN) 300 MG capsule Take 1 capsule (300 mg total) by mouth 2 (two) times daily. (Patient taking differently: Take 300 mg by mouth at bedtime. ) 180 capsule 3  . lisinopril (PRINIVIL,ZESTRIL) 5 MG tablet TAKE 1 TABLET BY MOUTH ONCE DAILY 90 tablet 3  . metoprolol succinate (TOPROL-XL) 50 MG 24 hr tablet Take 1 tablet (50 mg total) by mouth daily. 60 tablet 3  . nitroGLYCERIN (NITROSTAT) 0.4 MG SL tablet Place 1 tablet (0.4 mg total) under the tongue every 5 (five) minutes x 3 doses as needed for chest pain. 25 tablet 2  . potassium chloride SA (K-DUR,KLOR-CON) 10 MEQ tablet Take 1 tablet (10 mEq total) by mouth daily. 30 tablet 0  . warfarin (COUMADIN) 5 MG tablet TAKE BY MOUTH AS DIRECTED BY COUMADIN CLINIC 90 tablet 0   No current facility-administered medications for this encounter.    BP 135/67   Pulse 77   Wt 191 lb 12 oz (87 kg)   SpO2 98%   BMI 28.32 kg/m  General: NAD Neck: No JVD, no thyromegaly or thyroid nodule.  Lungs: Clear to auscultation bilaterally with normal respiratory effort. CV: Nondisplaced PMI.  Heart regular S1/S2, no S3/S4, 1/6 HSM LLSB.  No peripheral edema.  No carotid bruit.  Normal pedal pulses.  Abdomen: Soft, nontender, no hepatosplenomegaly, no distention.  Skin: Intact without lesions or rashes.  Neurologic: Alert and oriented x 3.  Psych: Normal affect. Extremities: No clubbing or cyanosis.  HEENT: Normal.   Assessment/Plan: 1. 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  EF has fallen when he has been in atrial fibrillation. However, echo in 10/18 showed EF up to 55-60%.  Currently, NYHA class II symptoms.  He is not volume overloaded on  exam. He feels better in NSR.  - With bradycardia and EF now normal, decrease Toprol XL to 50 mg daily.     - He will continue lisinopril 5 mg daily.  2. Atrial fibrillation: Paroxysmal.  He was unable to continue Tikosyn due to prolonged QT interval.  He is now on amiodarone and in NSR.  - Continue amiodarone.  Check LFTs, TSH, free T3/T4 (has had subclinical hypothyroidism).  He will need a regular eye exam.  - Continue warfarin.   3. CKD: Stage 3.  BMET today.  4. CAD: DES to LAD in 2/16.  No chest pain.  -  No ASA given stable CAD and warfarin use.  - Continue atorvastatin 80 mg daily (increased b/c LDL above goal), will need lipids/LFTs in another month or so. 5. Suspect OSA: Wife says that patient's nocturnal breathing pattern is much improved.  He does not want to do a sleep study.  Given his atrial fibrillation history, I think that he should have a sleep study.  Will reassess later on.   Loralie Champagne 12/03/2017

## 2017-12-03 NOTE — Patient Instructions (Addendum)
Decrease Metoprolol to 50 mg ONCE A DAY   Labs today  We will contact you in 6 months to schedule your next appointment.

## 2017-12-04 ENCOUNTER — Telehealth (HOSPITAL_COMMUNITY): Payer: Self-pay

## 2017-12-04 DIAGNOSIS — I5022 Chronic systolic (congestive) heart failure: Secondary | ICD-10-CM

## 2017-12-04 LAB — T3, FREE: T3, Free: 2.3 pg/mL (ref 2.0–4.4)

## 2017-12-04 NOTE — Telephone Encounter (Signed)
Notes recorded by Shirley Muscat, RN on 12/04/2017 at 10:10 AM EDT Pt wife aware of results, agreeable to med changes (changes made in [facility]), and Lab appointment made (orders placed)   ------  Notes recorded by Larey Dresser, MD on 12/04/2017 at 9:29 AM EDT Normal ------  Notes recorded by Larey Dresser, MD on 12/03/2017 at 6:59 PM EDT Creatinine higher, decrease lisinopril to 2.5 mg daily, BMET in 10 days.

## 2017-12-12 ENCOUNTER — Encounter (HOSPITAL_BASED_OUTPATIENT_CLINIC_OR_DEPARTMENT_OTHER): Payer: Medicare Other

## 2017-12-13 ENCOUNTER — Encounter: Payer: Self-pay | Admitting: Family Medicine

## 2017-12-13 ENCOUNTER — Ambulatory Visit (HOSPITAL_COMMUNITY)
Admission: RE | Admit: 2017-12-13 | Discharge: 2017-12-13 | Disposition: A | Discharge status: Home / Self Care | Payer: Medicare Other | Source: Ambulatory Visit | Attending: Cardiology | Admitting: Cardiology

## 2017-12-13 DIAGNOSIS — I5022 Chronic systolic (congestive) heart failure: Secondary | ICD-10-CM | POA: Diagnosis not present

## 2017-12-13 LAB — BASIC METABOLIC PANEL
Anion gap: 7 (ref 5–15)
BUN: 14 mg/dL (ref 6–20)
CALCIUM: 8.2 mg/dL — AB (ref 8.9–10.3)
CHLORIDE: 105 mmol/L (ref 101–111)
CO2: 27 mmol/L (ref 22–32)
Creatinine, Ser: 1.82 mg/dL — ABNORMAL HIGH (ref 0.61–1.24)
GFR calc Af Amer: 37 mL/min — ABNORMAL LOW (ref >60)
GFR, EST NON AFRICAN AMERICAN: 32 mL/min — AB (ref >60)
Glucose, Bld: 115 mg/dL — ABNORMAL HIGH (ref 65–99)
POTASSIUM: 3.9 mmol/L (ref 3.5–5.1)
Sodium: 139 mmol/L (ref 135–145)

## 2017-12-26 ENCOUNTER — Ambulatory Visit (INDEPENDENT_AMBULATORY_CARE_PROVIDER_SITE_OTHER): Payer: Medicare Other

## 2017-12-26 DIAGNOSIS — Z5181 Encounter for therapeutic drug level monitoring: Secondary | ICD-10-CM

## 2017-12-26 DIAGNOSIS — I481 Persistent atrial fibrillation: Secondary | ICD-10-CM | POA: Diagnosis not present

## 2017-12-26 DIAGNOSIS — Z7901 Long term (current) use of anticoagulants: Secondary | ICD-10-CM

## 2017-12-26 DIAGNOSIS — I4819 Other persistent atrial fibrillation: Secondary | ICD-10-CM

## 2017-12-26 LAB — POCT INR: INR: 2.8

## 2017-12-26 NOTE — Patient Instructions (Signed)
Description   Continue taking 1/2 tablet daily except 1 tablet on Mondays, Wednesdays and Fridays.  Recheck INR in 4 weeks. Call us with any medication changes or concern [phone] [facility], Main # [phone].

## 2018-01-01 DIAGNOSIS — H2513 Age-related nuclear cataract, bilateral: Secondary | ICD-10-CM | POA: Diagnosis not present

## 2018-01-01 DIAGNOSIS — Z79899 Other long term (current) drug therapy: Secondary | ICD-10-CM | POA: Diagnosis not present

## 2018-01-01 DIAGNOSIS — H5703 Miosis: Secondary | ICD-10-CM | POA: Diagnosis not present

## 2018-01-23 ENCOUNTER — Ambulatory Visit (INDEPENDENT_AMBULATORY_CARE_PROVIDER_SITE_OTHER): Payer: Medicare Other | Admitting: *Deleted

## 2018-01-23 DIAGNOSIS — Z7901 Long term (current) use of anticoagulants: Secondary | ICD-10-CM

## 2018-01-23 DIAGNOSIS — Z5181 Encounter for therapeutic drug level monitoring: Secondary | ICD-10-CM | POA: Diagnosis not present

## 2018-01-23 LAB — POCT INR: INR: 1.8

## 2018-01-23 NOTE — Patient Instructions (Signed)
Description   Today take 1 tablet then continue taking 1/2 tablet daily except 1 tablet on Mondays, Wednesdays and Fridays.  Recheck INR in 4 weeks. Call us with any medication changes or concern [phone] [facility], Main # [phone].

## 2018-01-25 ENCOUNTER — Other Ambulatory Visit: Payer: Self-pay | Admitting: Internal Medicine

## 2018-02-06 ENCOUNTER — Other Ambulatory Visit: Payer: Self-pay | Admitting: Internal Medicine

## 2018-02-20 ENCOUNTER — Ambulatory Visit (INDEPENDENT_AMBULATORY_CARE_PROVIDER_SITE_OTHER): Payer: Medicare Other

## 2018-02-20 DIAGNOSIS — Z7901 Long term (current) use of anticoagulants: Secondary | ICD-10-CM

## 2018-02-20 DIAGNOSIS — Z5181 Encounter for therapeutic drug level monitoring: Secondary | ICD-10-CM

## 2018-02-20 LAB — POCT INR: INR: 2.1 (ref 2.0–3.0)

## 2018-02-20 NOTE — Patient Instructions (Signed)
Description   Continue on same dosage 1/2 tablet daily except 1 tablet on Mondays, Wednesdays and Fridays.  Recheck INR in 5 weeks. Call us with any medication changes or concern [phone] [facility], Main # [phone].

## 2018-02-25 ENCOUNTER — Ambulatory Visit (INDEPENDENT_AMBULATORY_CARE_PROVIDER_SITE_OTHER): Payer: Medicare Other | Admitting: Family Medicine

## 2018-02-25 ENCOUNTER — Encounter: Payer: Self-pay | Admitting: Family Medicine

## 2018-02-25 VITALS — BP 92/60 | HR 45 | Temp 98.0°F | Ht 69.0 in | Wt 191.4 lb

## 2018-02-25 DIAGNOSIS — B0229 Other postherpetic nervous system involvement: Secondary | ICD-10-CM | POA: Diagnosis not present

## 2018-02-25 DIAGNOSIS — R251 Tremor, unspecified: Secondary | ICD-10-CM | POA: Diagnosis not present

## 2018-02-25 DIAGNOSIS — C4491 Basal cell carcinoma of skin, unspecified: Secondary | ICD-10-CM | POA: Diagnosis not present

## 2018-02-25 NOTE — Progress Notes (Signed)
Subjective:     Patient ID: Ricardo Valencia, male   DOB: [birth year], 84 y.o.   MRN: [id number]  HPI Patient is seen for multiple issues as follows  Patient and wife state he's had tremor for at least 10 years. He was diagnosed once previously with essential tremor. Tremor involves mostly hands. Some involvement of head and neck. Recently has been spilling some coffee which prompted this visit. They have not noted any change in his gait. He takes beta blocker with metoprolol extended release 50 mg once daily No known family history of tremor.   Patient has postherpetic neuralgia right face. He is taking fairly high-dose of gabapentin 300 mg 2 at night which does help him sleep, but having significant pain during the day intermittently. Currently not taking any daytime gabapentin  Patient was referred couple years ago to dermatology for multiple concerning skin lesions. Some of these have continued to grow and not healed. We have explained multiple times that these very likely represent basal cell and/or squamous cell. He had declined in the past going back to dermatology.  Past Medical History:  Diagnosis Date  . CAD (coronary artery disease)    DES to mid LAD 11/10/2014  . Chronic systolic heart failure ([place])   . Hay fever    hay fever, allergies  . History of echocardiogram    Echo 3/17: EF 40%, diff HK, trivial AI, trivial MR, mod LAE, mild reduced RVSF, mod RAE  . Hyperlipidemia   . Hypertension   . Persistent atrial fibrillation [facility])    Past Surgical History:  Procedure Laterality Date  . APPENDECTOMY  1947  . CARDIOVERSION N/A 12/30/2014   Procedure: CARDIOVERSION;  Surgeon: Jerline Pain, MD;  Location: [place];  Service: Cardiovascular;  Laterality: N/A;  . CORONARY ANGIOPLASTY WITH STENT PLACEMENT    . LEFT HEART CATHETERIZATION WITH CORONARY ANGIOGRAM N/A 11/10/2014   Procedure: LEFT HEART CATHETERIZATION WITH CORONARY ANGIOGRAM;  Surgeon: Wellington Hampshire, MD;   Location: [place] CATH LAB;  Service: Cardiovascular;  Laterality: N/A;  . PERCUTANEOUS CORONARY STENT INTERVENTION (PCI-S)  11/10/2014   Procedure: PERCUTANEOUS CORONARY STENT INTERVENTION (PCI-S);  Surgeon: Wellington Hampshire, MD;  Location: [facility] CATH LAB;  Service: Cardiovascular;;    reports that he has quit smoking. His smoking use included cigarettes. He quit after 2.00 years of use. He has never used smokeless tobacco. He reports that he does not drink alcohol or use drugs. family history includes Cancer in his father and mother. No Known Allergies     Review of Systems  Constitutional: Negative for fatigue.  Eyes: Negative for visual disturbance.  Respiratory: Negative for cough, chest tightness and shortness of breath.   Cardiovascular: Negative for chest pain, palpitations and leg swelling.  Neurological: Positive for tremors. Negative for dizziness, syncope, weakness, light-headedness and headaches.       Objective:   Physical Exam  Constitutional: He is oriented to person, place, and time. He appears well-developed and well-nourished.  HENT:  Right Ear: External ear normal.  Left Ear: External ear normal.  Mouth/Throat: Oropharynx is clear and moist.  Eyes: Pupils are equal, round, and reactive to light.  Neck: Neck supple. No thyromegaly present.  Cardiovascular: Normal rate and regular rhythm.  Pulmonary/Chest: Effort normal and breath sounds normal. No respiratory distress. He has no wheezes. He has no rales.  Musculoskeletal: He exhibits no edema.  Neurological: He is alert and oriented to person, place, and time. No cranial nerve deficit.  Patient has  resting tremor upper extremities bilaterally. Does not extinguish with movement. No cogwheel rigidity. Gait relatively normal.  Skin:  Patient has multiple nodular growths with fairly large growth left forehead-which is ulcerated in the center.       Assessment:     #1 bilateral upper extremity tremor. Suspect essential  tremor. Already on beta blocker  #2 postherpetic neuralgia right face with increased daytime symptoms  #3 multiple concerning skin lesions for skin cancer especially left forehead    Plan:     -We discussed options regarding tremor including possible neurology referral.  . We mentioned drugs like primidone but would have to balance potential benefits with risk of side effects. At this point patient declines and wishes to observe  -Minimize caffeine intake  -Increase gabapentin to 1 in the day and 2 at night  -We recommend referral back to dermatology for multiple concerning skin lesions-patient reluctantly agreed after some convincing. We explained without intervention these lesions would likely continue to grow  Eulas Post MD [place] [facility]

## 2018-02-25 NOTE — Patient Instructions (Signed)
Increase the Gabapentin to one in the morning and continue with two at night  Let me know if interested in neurology referral for tremor.

## 2018-02-26 ENCOUNTER — Other Ambulatory Visit (HOSPITAL_COMMUNITY): Payer: Self-pay | Admitting: Cardiology

## 2018-02-28 ENCOUNTER — Telehealth: Payer: Self-pay | Admitting: Physician Assistant

## 2018-02-28 NOTE — Telephone Encounter (Signed)
Patient's wife called because she was concerned about her husband's heart rate.  He had documented heart rates of 41 and 50.  He has had some issues with being weak when he was up walking around.  He is on Toprol-XL 50 mg daily.  Requested that she cut the metoprolol in half.  She is to check his blood pressure and heart rate in the morning, if his heart rate is less than 50, hold it completely.  Call back if his symptoms do not improve.  Rosaria Ferries, PA-C 02/28/2018 5:21 PM Beeper [phone]

## 2018-03-02 NOTE — Telephone Encounter (Signed)
Please call and see how he is doing

## 2018-03-06 ENCOUNTER — Other Ambulatory Visit (HOSPITAL_COMMUNITY): Payer: Self-pay | Admitting: Cardiology

## 2018-03-06 NOTE — Telephone Encounter (Signed)
Pt is doing well, wife reports pulse is 52 and he has denies dizziness, lightheaded, or another symptoms of hypotension

## 2018-03-07 ENCOUNTER — Encounter: Payer: Self-pay | Admitting: Family Medicine

## 2018-03-07 ENCOUNTER — Other Ambulatory Visit: Payer: Self-pay | Admitting: Family Medicine

## 2018-03-07 ENCOUNTER — Ambulatory Visit (INDEPENDENT_AMBULATORY_CARE_PROVIDER_SITE_OTHER): Payer: Medicare Other | Admitting: Family Medicine

## 2018-03-07 VITALS — BP 120/70 | HR 64 | Temp 98.2°F | Wt 186.4 lb

## 2018-03-07 DIAGNOSIS — M545 Low back pain, unspecified: Secondary | ICD-10-CM

## 2018-03-07 NOTE — Progress Notes (Signed)
  Subjective:     Patient ID: Ricardo Valencia, male   DOB: May 22, 1929, 84 y.o.   MRN: [id number]  HPI Patient is seen with low back pain. Wednesday he went to sit down on chair that was sitting on laminate flooring. Apparently the chair slipped backwards out from underneath him and he fell firmly onto the ground on his buttock. Since that time he has had pain lower lumbar area bilaterally. Denies any sacral pain. Pain with movement and with certain position changes. Pain is moderate to mild. He's tried some Tylenol. No visible bruising. No other injuries reported. No head injury.  Past Medical History:  Diagnosis Date  . CAD (coronary artery disease)    DES to mid LAD 11/10/2014  . Chronic systolic heart failure ([place])   . Hay fever    hay fever, allergies  . History of echocardiogram    Echo 3/17: EF 40%, diff HK, trivial AI, trivial MR, mod LAE, mild reduced RVSF, mod RAE  . Hyperlipidemia   . Hypertension   . Persistent atrial fibrillation [facility])    Past Surgical History:  Procedure Laterality Date  . APPENDECTOMY  1947  . CARDIOVERSION N/A 12/30/2014   Procedure: CARDIOVERSION;  Surgeon: Jerline Pain, MD;  Location: [place];  Service: Cardiovascular;  Laterality: N/A;  . CORONARY ANGIOPLASTY WITH STENT PLACEMENT    . LEFT HEART CATHETERIZATION WITH CORONARY ANGIOGRAM N/A 11/10/2014   Procedure: LEFT HEART CATHETERIZATION WITH CORONARY ANGIOGRAM;  Surgeon: Wellington Hampshire, MD;  Location: [place] CATH LAB;  Service: Cardiovascular;  Laterality: N/A;  . PERCUTANEOUS CORONARY STENT INTERVENTION (PCI-S)  11/10/2014   Procedure: PERCUTANEOUS CORONARY STENT INTERVENTION (PCI-S);  Surgeon: Wellington Hampshire, MD;  Location: [facility] CATH LAB;  Service: Cardiovascular;;    reports that he has quit smoking. His smoking use included cigarettes. He quit after 2.00 years of use. He has never used smokeless tobacco. He reports that he does not drink alcohol or use drugs. family history includes Cancer in  his father and mother. No Known Allergies   Review of Systems  Constitutional: Negative for chills and fever.  Gastrointestinal: Negative for abdominal pain.  Musculoskeletal: Positive for back pain.  Neurological: Negative for weakness and numbness.       Objective:   Physical Exam  Constitutional: He appears well-developed and well-nourished.  Cardiovascular: Normal rate.  Pulmonary/Chest: Effort normal and breath sounds normal.  Musculoskeletal:  No spinal tenderness. He has some mild nonspecific soft tissue tenderness lower lumbar region bilaterally.  Neurological:  Full-strength lower extremities       Assessment:     Low back pain following fall. Low clinical suspicion for acute compression fracture. No spinal tenderness on exam    Plan:     -Recommend conservative measures with Tylenol and they'll try some topical heat and consider topical sports cream -Avoid nonsteroidals with his age and Coumadin therapy -Avoid muscle relaxers -Touch base in one week if not improving  Ricardo Post MD [place] [facility] at [facility]

## 2018-03-07 NOTE — Patient Instructions (Signed)
Try some heat for symptom relief  Continue with Tylenol as needed  Let me know if back not not improved by next week.

## 2018-03-14 ENCOUNTER — Ambulatory Visit: Payer: Self-pay | Admitting: Family Medicine

## 2018-03-14 NOTE — Telephone Encounter (Signed)
[name] (pt with caller) called stating pt's lower back pain is not any better. Pt went to go sit down in his recliner and the chair slid back and he feel with his full weight on his buttocks. He was seen at PCP office 03/07/18 and was advised to take acetaminophen and heating pad.  Wife stated that when he is sitting still in a chair and heating pad his pain is a 3-4/10. When pt tries to get up from chair or the bed his pain is 7/10.  Pt is using acetaminophen and gabapentin per wife. Pt denies weakness, numbness, or problems with bowel and bladder control. Pt was wanting to know if he can get something stronger than acetaminophen due to continued pain.  Care advice given and appt made for Monday with Dr Sarajane Jews.  (Dr [name] not available until Tuesday and wife cannot make the slot available on July 2)  Reason for Disposition . [1] MODERATE back pain (e.g., interferes with normal activities) AND [2] present > 3 days  Answer Assessment - Initial Assessment Questions 1. ONSET: "When did the pain begin?"      03/02/18 2. LOCATION: "Where does it hurt?" (upper, mid or lower back)     Lower back 3. SEVERITY: "How bad is the pain?"  (e.g., Scale 1-10; mild, moderate, or severe)   - MILD (1-3): doesn't interfere with normal activities    - MODERATE (4-7): interferes with normal activities or awakens from sleep    - SEVERE (8-10): excruciating pain, unable to do any normal activities      Mild sitting and not moving-- when moving pain goes to 7/10 4. PATTERN: "Is the pain constant?" (e.g., yes, no; constant, intermittent)      yes 5. RADIATION: "Does the pain shoot into your legs or elsewhere?"     no 6. CAUSE:  "What do you think is causing the back pain?"      Went to sit and the chair slid and he landed on his buttocks  7. BACK OVERUSE:  "Any recent lifting of heavy objects, strenuous work or exercise?"     no 8. MEDICATIONS: "What have you taken so far for the pain?" (e.g., nothing,  acetaminophen, NSAIDS)     Acetaminophen and gabapentin using heating pad 9. NEUROLOGIC SYMPTOMS: "Do you have any weakness, numbness, or problems with bowel/bladder control?"     no 10. OTHER SYMPTOMS: "Do you have any other symptoms?" (e.g., fever, abdominal pain, burning with urination, blood in urine)       no 11. PREGNANCY: "Is there any chance you are pregnant?" (e.g., yes, no; LMP)       n/a  Protocols used: BACK PAIN-A-AH

## 2018-03-14 NOTE — Telephone Encounter (Signed)
Seeing Dr [name] 03/17/18

## 2018-03-17 ENCOUNTER — Encounter: Payer: Self-pay | Admitting: Family Medicine

## 2018-03-17 ENCOUNTER — Ambulatory Visit (INDEPENDENT_AMBULATORY_CARE_PROVIDER_SITE_OTHER): Payer: Medicare Other | Admitting: Family Medicine

## 2018-03-17 ENCOUNTER — Ambulatory Visit: Payer: Medicare Other | Admitting: Family Medicine

## 2018-03-17 VITALS — BP 116/60 | HR 66 | Temp 98.8°F | Wt 185.4 lb

## 2018-03-17 DIAGNOSIS — M545 Low back pain, unspecified: Secondary | ICD-10-CM

## 2018-03-17 NOTE — Progress Notes (Signed)
  Subjective:     Patient ID: Ricardo Valencia, male   DOB: 01-18-1929, 84 y.o.   MRN: [id number]  HPI Patient seen with some ongoing back pain. Refer to recent note. Just prior to last visit on the 21st he went to sit on a chair and the chair apparently slipped backwards out from underneath him and he fell onto the ground very firmly. He's had some back pain bilateral lumbar area since then. Did not have any spinal tenderness on recent exam. No visible swelling.  He's tried some Tylenol as well as heat and topical creams without improvement. He is on chronic Coumadin and cannot take nonsteroidals. Avoiding muscle relaxers with his age. We had low clinical suspicion for compression fracture  Wife notes that he has been more debilitated recently in terms of getting up and down. He recently got into the swimming pool and had to have several family members help get him out.  Past Medical History:  Diagnosis Date  . CAD (coronary artery disease)    DES to mid LAD 11/10/2014  . Chronic systolic heart failure ([place])   . Hay fever    hay fever, allergies  . History of echocardiogram    Echo 3/17: EF 40%, diff HK, trivial AI, trivial MR, mod LAE, mild reduced RVSF, mod RAE  . Hyperlipidemia   . Hypertension   . Persistent atrial fibrillation [facility])    Past Surgical History:  Procedure Laterality Date  . APPENDECTOMY  1947  . CARDIOVERSION N/A 12/30/2014   Procedure: CARDIOVERSION;  Surgeon: Jerline Pain, MD;  Location: [place];  Service: Cardiovascular;  Laterality: N/A;  . CORONARY ANGIOPLASTY WITH STENT PLACEMENT    . LEFT HEART CATHETERIZATION WITH CORONARY ANGIOGRAM N/A 11/10/2014   Procedure: LEFT HEART CATHETERIZATION WITH CORONARY ANGIOGRAM;  Surgeon: Wellington Hampshire, MD;  Location: [place] CATH LAB;  Service: Cardiovascular;  Laterality: N/A;  . PERCUTANEOUS CORONARY STENT INTERVENTION (PCI-S)  11/10/2014   Procedure: PERCUTANEOUS CORONARY STENT INTERVENTION (PCI-S);  Surgeon: Wellington Hampshire, MD;  Location: [facility] CATH LAB;  Service: Cardiovascular;;    reports that he has quit smoking. His smoking use included cigarettes. He quit after 2.00 years of use. He has never used smokeless tobacco. He reports that he does not drink alcohol or use drugs. family history includes Cancer in his father and mother. No Known Allergies   Review of Systems  Constitutional: Negative for appetite change and unexpected weight change.  Cardiovascular: Negative for chest pain.  Gastrointestinal: Negative for abdominal pain.  Genitourinary: Negative for dysuria.  Musculoskeletal: Positive for back pain.  Neurological: Negative for numbness.       Objective:   Physical Exam  Constitutional: He appears well-developed and well-nourished.  Cardiovascular: Normal rate.  Pulmonary/Chest: Effort normal and breath sounds normal.  Musculoskeletal: He exhibits no edema.  No spinal tenderness. He has some mild bilateral lower lumbar tenderness bilaterally. Straight leg raises are negative.  Neurological:  No strength deficits with plantar flexion, dorsiflexion, or knee extension bilaterally       Assessment:     Bilateral lumbar back pain following fall. Low clinical suspicion for fracture    Plan:     -Go ahead with lumbar spine films given his age and history of trauma -Set up physical therapy for his back pain and also to try to help with reducing risk of falls  Eulas Post MD [place] [facility] at [facility]

## 2018-03-17 NOTE — Patient Instructions (Signed)
Go for back X-ray as discussed  We are setting up some physical therapy.

## 2018-03-18 ENCOUNTER — Ambulatory Visit (INDEPENDENT_AMBULATORY_CARE_PROVIDER_SITE_OTHER): Payer: Medicare Other

## 2018-03-18 DIAGNOSIS — M545 Low back pain, unspecified: Secondary | ICD-10-CM

## 2018-03-18 DIAGNOSIS — S32000A Wedge compression fracture of unspecified lumbar vertebra, initial encounter for closed fracture: Secondary | ICD-10-CM | POA: Diagnosis not present

## 2018-03-27 ENCOUNTER — Ambulatory Visit (INDEPENDENT_AMBULATORY_CARE_PROVIDER_SITE_OTHER): Payer: Medicare Other | Admitting: Pharmacist

## 2018-03-27 DIAGNOSIS — Z5181 Encounter for therapeutic drug level monitoring: Secondary | ICD-10-CM | POA: Diagnosis not present

## 2018-03-27 DIAGNOSIS — Z7901 Long term (current) use of anticoagulants: Secondary | ICD-10-CM | POA: Diagnosis not present

## 2018-03-27 LAB — POCT INR: INR: 2.3 (ref 2.0–3.0)

## 2018-03-27 NOTE — Patient Instructions (Signed)
Description   Continue on same dosage 1/2 tablet daily except 1 tablet on Mondays, Wednesdays and Fridays.  Recheck INR in 6 weeks. Call us with any medication changes or concern #[phone] [facility], Main # [phone].       

## 2018-03-30 ENCOUNTER — Other Ambulatory Visit: Payer: Self-pay | Admitting: Cardiovascular Disease

## 2018-04-01 ENCOUNTER — Encounter: Payer: Self-pay | Admitting: Physical Therapy

## 2018-04-01 ENCOUNTER — Ambulatory Visit: Payer: Medicare Other | Attending: Family Medicine | Admitting: Physical Therapy

## 2018-04-01 ENCOUNTER — Other Ambulatory Visit: Payer: Self-pay

## 2018-04-01 DIAGNOSIS — R2681 Unsteadiness on feet: Secondary | ICD-10-CM | POA: Insufficient documentation

## 2018-04-01 DIAGNOSIS — R293 Abnormal posture: Secondary | ICD-10-CM | POA: Insufficient documentation

## 2018-04-01 DIAGNOSIS — M545 Low back pain, unspecified: Secondary | ICD-10-CM

## 2018-04-01 DIAGNOSIS — M6281 Muscle weakness (generalized): Secondary | ICD-10-CM

## 2018-04-01 NOTE — Patient Instructions (Signed)
Access Code: [email or web address]  URL: [email or web address]  Date: 04/01/2018  Prepared by: Elly Modena   Exercises  Hooklying Isometric Clamshell - 10 reps - 3 sets - 1x daily - 7x weekly  Hip Flexion Stretch - 10 reps - 10 hold - 1x daily - 7x weekly  Sitting to Supine Roll - 10 reps - 3 sets - 1x daily - 7x weekly    [facility] [address] [place], [place] [address] Phone # [phone] Fax [phone]

## 2018-04-01 NOTE — Therapy (Signed)
[facility] [address], [place], [place], [address] Phone: [phone]   Fax:  [phone]  Physical Therapy Evaluation  Patient Details  Name: Ricardo Valencia MRN: [id number] Date of Birth: [birth year] Referring Provider: Lawson Fiscal, MD    Encounter Date: 04/01/2018  PT End of Session - 04/01/18 1132    Visit Number  1    Date for PT Re-Evaluation  06/02/18    Authorization Type  Medicare     Authorization Time Period  04/01/18 to 06/02/18    PT Start Time  1002    PT Stop Time  1044    PT Time Calculation (min)  42 min    Activity Tolerance  Patient tolerated treatment well;No increased pain    Behavior During Therapy  WFL for tasks assessed/performed       Past Medical History:  Diagnosis Date  . CAD (coronary artery disease)    DES to mid LAD 11/10/2014  . Chronic systolic heart failure ([place])   . Hay fever    hay fever, allergies  . History of echocardiogram    Echo 3/17: EF 40%, diff HK, trivial AI, trivial MR, mod LAE, mild reduced RVSF, mod RAE  . Hyperlipidemia   . Hypertension   . Persistent atrial fibrillation [facility])     Past Surgical History:  Procedure Laterality Date  . APPENDECTOMY  1947  . CARDIOVERSION N/A 12/30/2014   Procedure: CARDIOVERSION;  Surgeon: Jerline Pain, MD;  Location: [place];  Service: Cardiovascular;  Laterality: N/A;  . CORONARY ANGIOPLASTY WITH STENT PLACEMENT    . LEFT HEART CATHETERIZATION WITH CORONARY ANGIOGRAM N/A 11/10/2014   Procedure: LEFT HEART CATHETERIZATION WITH CORONARY ANGIOGRAM;  Surgeon: Wellington Hampshire, MD;  Location: [place] CATH LAB;  Service: Cardiovascular;  Laterality: N/A;  . PERCUTANEOUS CORONARY STENT INTERVENTION (PCI-S)  11/10/2014   Procedure: PERCUTANEOUS CORONARY STENT INTERVENTION (PCI-S);  Surgeon: Wellington Hampshire, MD;  Location: [facility] CATH LAB;  Service: Cardiovascular;;    There were no vitals filed for this visit.   Subjective  Assessment - 04/01/18 1006    Subjective  Pt states that back on Father's day, his chair slid out from underneath him. He had back pain since then, and went to see his PCP. He didn't have an xray until about 1 week later. Pt states he was told he had a compression fracture and was sent to PT. He feels the pain is some improved, but if he walks/sits too long or when he bends forward he will have pain.     Pertinent History  CAD, heart failure, allergies, a-fib, HTN    Patient Stated Goals  decrease pain    Currently in Pain?  Yes    Pain Score  3     Pain Location  Back    Pain Orientation  Right;Left;Lower    Pain Descriptors / Indicators  Aching    Pain Type  Acute pain    Pain Radiating Towards  none     Pain Onset  More than a month ago    Pain Frequency  Intermittent    Aggravating Factors   alot of sitting and standing    Pain Relieving Factors  laying straight in bed     Effect of Pain on Daily Activities  difficulty walking or sitting in the car for long periods of time          [facility] PT Assessment - 04/01/18 0001  Assessment   Medical Diagnosis  acute LBP and deconditioning     Referring Provider  Lawson Fiscal, MD     Onset Date/Surgical Date  03/01/18    Next MD Visit  unsure     Prior Therapy  none       Precautions   Precautions  Other (comment)    Precaution Comments  avoid excessive lumbar flexion, L1 compression fx       Restrictions   Weight Bearing Restrictions  No      Balance Screen   Has the patient fallen in the past 6 months  Yes    How many times?  2    Has the patient had a decrease in activity level because of a fear of falling?   No    Is the patient reluctant to leave their home because of a fear of falling?   No      Home Film/video editor residence    Additional Comments  4-5 steps into the house       Prior Function   Level of Independence  Independent    Vocation  Part time employment    Vocation Requirements   drive 15 miles to work       Cognition   Overall Cognitive Status  Within Functional Limits for tasks assessed      Observation/Other Assessments   Focus on Therapeutic Outcomes (FOTO)   47% limited       Posture/Postural Control   Posture Comments  forward head, rounded shoulders      ROM / Strength   AROM / PROM / Strength  Strength;AROM      AROM   Overall AROM Comments  lumbar active rotation limited 75% each direction, pain with Rt rotation       Strength   Strength Assessment Site  Hip;Knee;Ankle    Right/Left Hip  Right;Left    Right Hip Flexion  4/5 LBP    Right Hip ABduction  2/5    Left Hip Flexion  4/5 LBP    Left Hip ABduction  2/5    Right/Left Knee  Right;Left    Right Knee Flexion  3/5    Right Knee Extension  5/5    Left Knee Flexion  3/5    Left Knee Extension  5/5    Right/Left Ankle  Right;Left    Right Ankle Dorsiflexion  5/5    Left Ankle Dorsiflexion  5/5      Palpation   Palpation comment  tender to palpation along lumbar paraspinals      Transfers   Five time sit to stand comments   17 sec, no UE    Comments  Pt requiring UE support to sit and stand       High Level Balance   High Level Balance Comments  NBOS EO: 20 sec, EC: 10 sec; tandem 5-10 sec each                 Objective measurements completed on examination: See above findings.      [facility] Adult PT Treatment/Exercise - 04/01/18 0001      Exercises   Exercises  Lumbar      Lumbar Exercises: Stretches   Other Lumbar Stretch Exercise  single knee to chest 2x10 sec each      Lumbar Exercises: Seated   Other Seated Lumbar Exercises  B clams x10 reps with red TB  PT Education - 04/01/18 1105    Education Details  eval findings/POC; importance of avoiding excessive flexion movements; anatomy of the spine and compression fractures.    Person(s) Educated  Patient    Methods  Explanation;Demonstration;Handout    Comprehension  Returned  demonstration;Verbalized understanding       PT Short Term Goals - 04/01/18 1053      PT SHORT TERM GOAL #1   Title  Pt will demo consistency and independence with HEP to increase strength and mobility.    Time  3    Period  Weeks    Status  New    Target Date  04/22/18      PT SHORT TERM GOAL #2   Title  Pt will demo proper log roll technique with supine to/from sit transitions, without cuing from therapist, 2/3 trials.     Time  3    Period  Weeks    Status  New      PT SHORT TERM GOAL #3   Title  Pt will report atleast 25% decrease in low back pain with daily activity.     Time  4    Period  Weeks    Status  New    Target Date  04/29/18        PT Long Term Goals - 04/01/18 1055      PT LONG TERM GOAL #1   Title  Pt will demo improved BLE strength to atleast 4/5 MMT which will improve his safety with daily activity.     Time  8    Period  Weeks    Status  New    Target Date  06/02/18      PT LONG TERM GOAL #2   Title  Pt will demo understanding of proper lifting mechanics, without excessive lumbar flexion x5 reps, to improve his safety with pulling weeds at home.     Time  8    Period  Weeks    Status  New      PT LONG TERM GOAL #3   Title  Pt will complete 5x sit to stand in less than 14 sec without UE support or increase in LBP, to reflect an increase in LE strength and power.     Time  8    Period  Weeks    Status  New      PT LONG TERM GOAL #4   Title  Pt will complete TUG in less than 11 sec, to reflect improvements in stability with ambulation.    Time  8    Period  Weeks    Status  New      PT LONG TERM GOAL #5   Title  Pt will report atleast 60% decrease in lumbar pain with sitting and standing activity, to allow him to ride in a car to visit his grandchildren in [place].     Time  8    Period  Weeks    Status  New             Plan - 04/01/18 1048    Clinical Impression Statement  Pt is an 84 y.o F referred to OPPT s/p fall and L1  compression fracture. Pt presents with lumbar pain and muscle spasm, in addition to limitations in BLE weakness, limited flexibility and difficulty completing bed mobility tasks. He is able to demonstrate log roll technique during supine to/from sit transitions, but does require intermittent cuing from the therapist for improved  efficiency with this. Pt scored below his age norm on the 5x sit to stand and TUG, and therapist had to provide CGA during the TUG to prevent LOB. Pt would benefit from skilled PT to provide intervention to decrease pain, improve LE strength, flexibility and balance, to decrease his risk of falling and causing further injury.     History and Personal Factors relevant to plan of care:  L1 compression fracture    Clinical Presentation  Stable    Clinical Presentation due to:  somewhat improved     Clinical Decision Making  Low    Rehab Potential  Good    PT Frequency  2x / week    PT Duration  8 weeks    PT Treatment/Interventions  ADLs/Self Care Home Management;Moist Heat;Electrical Stimulation;Cryotherapy;Therapeutic exercise;Therapeutic activities;Functional mobility training;Stair training;Balance training;Patient/family education;Manual techniques;Neuromuscular re-education;Passive range of motion;Taping;Dry needling    PT Next Visit Plan  addition to HEP for trunk stabilization and hip strength; hip flexibility; STM to lumbar paraspinals    PT Home Exercise Plan  [email or web address]    Consulted and Agree with Plan of Care  Patient;Family member/caregiver    Family Member Consulted  spouse        Patient will benefit from skilled therapeutic intervention in order to improve the following deficits and impairments:  Decreased activity tolerance, Decreased safety awareness, Decreased strength, Impaired flexibility, Postural dysfunction, Pain, Improper body mechanics, Decreased range of motion, Increased muscle spasms, Hypomobility, Decreased endurance, Decreased balance, Difficulty  walking  Visit Diagnosis: Acute bilateral low back pain without sciatica  Muscle weakness (generalized)  Abnormal posture  Unsteadiness on feet     Problem List Patient Active Problem List   Diagnosis Date Noted  . Tremor of both hands 02/25/2018  . Visit for monitoring Tikosyn therapy 07/16/2017  . Postherpetic neuralgia 03/01/2017  . NSTEMI (non-ST elevated myocardial infarction) ([place]) 06/24/2016  . Long term (current) use of anticoagulants [Z79.01] 11/29/2015  . Persistent atrial fibrillation ([place])   . Chronic systolic heart failure ([place]) 11/17/2015  . Basal cell carcinoma 10/20/2015  . Herpes zoster   . Encounter for therapeutic drug monitoring 02/15/2015  . Ischemic cardiomyopathy 11/25/2014  . CAD (coronary artery disease)   . Hypertension 01/18/2011  . Hyperlipidemia 01/18/2011    11:34 AM,04/01/18 Sherol Dade PT, DPT [facility] [place]  [facility] [address] [place], [place], [address] Phone: [phone]   Fax:  [phone]  Name: Ricardo Valencia MRN: [id number] Date of Birth: 01-25-1929

## 2018-04-07 ENCOUNTER — Telehealth: Payer: Self-pay | Admitting: Family Medicine

## 2018-04-07 DIAGNOSIS — R251 Tremor, unspecified: Secondary | ICD-10-CM

## 2018-04-07 DIAGNOSIS — B0229 Other postherpetic nervous system involvement: Secondary | ICD-10-CM

## 2018-04-07 NOTE — Telephone Encounter (Signed)
Okay to refer? 

## 2018-04-07 NOTE — Telephone Encounter (Signed)
Copied from [place] [id number]. Topic: Referral - Request >> Apr 07, 2018 10:18 AM Oliver Pila B wrote: Reason for CRM: pt called to get the referral for the neurologist started and is asking for Dr. Craig Staggers of [place])

## 2018-04-07 NOTE — Telephone Encounter (Signed)
Dr Susa Raring would presume.  OK to refer

## 2018-04-08 ENCOUNTER — Ambulatory Visit: Payer: Medicare Other

## 2018-04-08 DIAGNOSIS — R2681 Unsteadiness on feet: Secondary | ICD-10-CM

## 2018-04-08 DIAGNOSIS — M6281 Muscle weakness (generalized): Secondary | ICD-10-CM

## 2018-04-08 DIAGNOSIS — M545 Low back pain, unspecified: Secondary | ICD-10-CM

## 2018-04-08 DIAGNOSIS — R293 Abnormal posture: Secondary | ICD-10-CM | POA: Diagnosis not present

## 2018-04-08 NOTE — Therapy (Signed)
[facility] [address] [place], [place], [address] Phone: [phone]   Fax:  [phone]  Physical Therapy Treatment  Patient Details  Name: Ricardo Valencia MRN: [id number] Date of Birth: 1929/03/01 Referring Provider: Lawson Fiscal, MD    Encounter Date: 04/08/2018  PT End of Session - 04/08/18 1143    Visit Number  2    Date for PT Re-Evaluation  06/02/18    Authorization Type  Medicare     Authorization Time Period  04/01/18 to 06/02/18    PT Start Time  1104    PT Stop Time  1143    PT Time Calculation (min)  39 min    Activity Tolerance  Patient tolerated treatment well;No increased pain    Behavior During Therapy  WFL for tasks assessed/performed       Past Medical History:  Diagnosis Date  . CAD (coronary artery disease)    DES to mid LAD 11/10/2014  . Chronic systolic heart failure ([place])   . Hay fever    hay fever, allergies  . History of echocardiogram    Echo 3/17: EF 40%, diff HK, trivial AI, trivial MR, mod LAE, mild reduced RVSF, mod RAE  . Hyperlipidemia   . Hypertension   . Persistent atrial fibrillation [facility])     Past Surgical History:  Procedure Laterality Date  . APPENDECTOMY  1947  . CARDIOVERSION N/A 12/30/2014   Procedure: CARDIOVERSION;  Surgeon: Jerline Pain, MD;  Location: [place];  Service: Cardiovascular;  Laterality: N/A;  . CORONARY ANGIOPLASTY WITH STENT PLACEMENT    . LEFT HEART CATHETERIZATION WITH CORONARY ANGIOGRAM N/A 11/10/2014   Procedure: LEFT HEART CATHETERIZATION WITH CORONARY ANGIOGRAM;  Surgeon: Wellington Hampshire, MD;  Location: [place] CATH LAB;  Service: Cardiovascular;  Laterality: N/A;  . PERCUTANEOUS CORONARY STENT INTERVENTION (PCI-S)  11/10/2014   Procedure: PERCUTANEOUS CORONARY STENT INTERVENTION (PCI-S);  Surgeon: Wellington Hampshire, MD;  Location: [facility] CATH LAB;  Service: Cardiovascular;;    There were no vitals filed for this visit.  Subjective  Assessment - 04/08/18 1105    Subjective  I am doing OK.  My back hurts when I stand too long.      Currently in Pain?  Yes    Pain Score  3     Pain Location  Back    Pain Orientation  Right;Left;Lower    Pain Descriptors / Indicators  Aching    Pain Type  Acute pain    Pain Onset  More than a month ago    Pain Frequency  Intermittent    Aggravating Factors   sitting and standing    Pain Relieving Factors  flat on back                       [facility] Adult PT Treatment/Exercise - 04/08/18 0001      Exercises   Exercises  Knee/Hip      Lumbar Exercises: Stretches   Active Hamstring Stretch  Left;Right;3 reps;20 seconds    Single Knee to Chest Stretch  5 reps;Left;Right;10 seconds      Lumbar Exercises: Aerobic   Nustep  Level 1x 8 minutes  PT present to discuss progress      Lumbar Exercises: Seated   Long Arc Quad on Chair  Strengthening;Both;1 set;10 reps    Other Seated Lumbar Exercises  B clams 2x10 reps with red TB      Knee/Hip Exercises: Seated  Sit to Sand  10 reps;without UE support seated on black pad             PT Education - 04/08/18 1131    Education Details   Access Code: [email or web address]     Person(s) Educated  Patient    Methods  Explanation;Demonstration;Handout    Comprehension  Verbalized understanding;Returned demonstration       PT Short Term Goals - 04/01/18 1053      PT SHORT TERM GOAL #1   Title  Pt will demo consistency and independence with HEP to increase strength and mobility.    Time  3    Period  Weeks    Status  New    Target Date  04/22/18      PT SHORT TERM GOAL #2   Title  Pt will demo proper log roll technique with supine to/from sit transitions, without cuing from therapist, 2/3 trials.     Time  3    Period  Weeks    Status  New      PT SHORT TERM GOAL #3   Title  Pt will report atleast 25% decrease in low back pain with daily activity.     Time  4    Period  Weeks    Status  New    Target Date  04/29/18         PT Long Term Goals - 04/01/18 1055      PT LONG TERM GOAL #1   Title  Pt will demo improved BLE strength to atleast 4/5 MMT which will improve his safety with daily activity.     Time  8    Period  Weeks    Status  New    Target Date  06/02/18      PT LONG TERM GOAL #2   Title  Pt will demo understanding of proper lifting mechanics, without excessive lumbar flexion x5 reps, to improve his safety with pulling weeds at home.     Time  8    Period  Weeks    Status  New      PT LONG TERM GOAL #3   Title  Pt will complete 5x sit to stand in less than 14 sec without UE support or increase in LBP, to reflect an increase in LE strength and power.     Time  8    Period  Weeks    Status  New      PT LONG TERM GOAL #4   Title  Pt will complete TUG in less than 11 sec, to reflect improvements in stability with ambulation.    Time  8    Period  Weeks    Status  New      PT LONG TERM GOAL #5   Title  Pt will report atleast 60% decrease in lumbar pain with sitting and standing activity, to allow him to ride in a car to visit his grandchildren in [place].     Time  8    Period  Weeks    Status  New            Plan - 04/08/18 1107    Clinical Impression Statement  Pt reports compliance with HEP and is able to demonstrate all exercises correctly.  Pt with bil LE weakness and limited LE flexiblity and new HEP targeted these deficits.  Pt required close supervision for safety and verbal cues for technique.  Pt will continue to benefit from skilled  PT for strength, flexibility and balance training.      Rehab Potential  Good    PT Frequency  2x / week    PT Duration  8 weeks    PT Treatment/Interventions  ADLs/Self Care Home Management;Moist Heat;Electrical Stimulation;Cryotherapy;Therapeutic exercise;Therapeutic activities;Functional mobility training;Stair training;Balance training;Patient/family education;Manual techniques;Neuromuscular re-education;Passive range of  motion;Taping;Dry needling    PT Next Visit Plan  LE strength, flexibility, core strength, manual as needed.    PT Home Exercise Plan  [email or web address]    Recommended Other Services  initial certification is signed    Consulted and Agree with Plan of Care  Patient    Family Member Consulted  spouse        Patient will benefit from skilled therapeutic intervention in order to improve the following deficits and impairments:  Decreased activity tolerance, Decreased safety awareness, Decreased strength, Impaired flexibility, Postural dysfunction, Pain, Improper body mechanics, Decreased range of motion, Increased muscle spasms, Hypomobility, Decreased endurance, Decreased balance, Difficulty walking  Visit Diagnosis: Muscle weakness (generalized)  Acute bilateral low back pain without sciatica  Abnormal posture  Unsteadiness on feet     Problem List Patient Active Problem List   Diagnosis Date Noted  . Tremor of both hands 02/25/2018  . Visit for monitoring Tikosyn therapy 07/16/2017  . Postherpetic neuralgia 03/01/2017  . NSTEMI (non-ST elevated myocardial infarction) ([place]) 06/24/2016  . Long term (current) use of anticoagulants [Z79.01] 11/29/2015  . Persistent atrial fibrillation ([place])   . Chronic systolic heart failure ([place]) 11/17/2015  . Basal cell carcinoma 10/20/2015  . Herpes zoster   . Encounter for therapeutic drug monitoring 02/15/2015  . Ischemic cardiomyopathy 11/25/2014  . CAD (coronary artery disease)   . Hypertension 01/18/2011  . Hyperlipidemia 01/18/2011     Ricardo Valencia, PT 04/08/18 11:44 AM  [facility] [address], [place], [place], [address] Phone: [phone]   Fax:  [phone]  Name: Ricardo Valencia MRN: [id number] Date of Birth: May 04, 1929

## 2018-04-08 NOTE — Telephone Encounter (Signed)
Referral placed.

## 2018-04-08 NOTE — Patient Instructions (Addendum)
Access Code: [email or web address]  URL: [email or web address]  Date: 04/08/2018  Prepared by: Sigurd Sos   Exercises  Seated Hamstring Stretch - 3 reps - 1 sets - 20 hold - 3x daily - 7x weekly  Seated Hip Abduction with Pelvic Floor Contraction and Resistance Loop - 10 reps - 2 sets - 2x daily - 7x weekly  Supine Hip Adduction Isometric with Ball - 10 reps - 2 sets - 5 hold - 2x daily - 7x weekly

## 2018-04-13 ENCOUNTER — Other Ambulatory Visit: Payer: Self-pay | Admitting: Cardiovascular Disease

## 2018-04-15 ENCOUNTER — Ambulatory Visit: Payer: Medicare Other

## 2018-04-15 DIAGNOSIS — R2681 Unsteadiness on feet: Secondary | ICD-10-CM | POA: Diagnosis not present

## 2018-04-15 DIAGNOSIS — M545 Low back pain, unspecified: Secondary | ICD-10-CM

## 2018-04-15 DIAGNOSIS — M6281 Muscle weakness (generalized): Secondary | ICD-10-CM | POA: Diagnosis not present

## 2018-04-15 DIAGNOSIS — R293 Abnormal posture: Secondary | ICD-10-CM

## 2018-04-15 NOTE — Therapy (Signed)
[facility] [address] [place], [place], [address] Phone: [phone]   Fax:  [phone]  Physical Therapy Treatment  Patient Details  Name: Ricardo Valencia MRN: [id number] Date of Birth: [birth year] Referring Provider: Lawson Fiscal, MD    Encounter Date: 04/15/2018  PT End of Session - 04/15/18 1141    Visit Number  3    Date for PT Re-Evaluation  06/02/18    Authorization Type  Medicare     Authorization Time Period  04/01/18 to 06/02/18    PT Start Time  1100    PT Stop Time  1139    PT Time Calculation (min)  39 min    Activity Tolerance  Patient tolerated treatment well;No increased pain    Behavior During Therapy  WFL for tasks assessed/performed       Past Medical History:  Diagnosis Date  . CAD (coronary artery disease)    DES to mid LAD 11/10/2014  . Chronic systolic heart failure ([place])   . Hay fever    hay fever, allergies  . History of echocardiogram    Echo 3/17: EF 40%, diff HK, trivial AI, trivial MR, mod LAE, mild reduced RVSF, mod RAE  . Hyperlipidemia   . Hypertension   . Persistent atrial fibrillation [facility])     Past Surgical History:  Procedure Laterality Date  . APPENDECTOMY  1947  . CARDIOVERSION N/A 12/30/2014   Procedure: CARDIOVERSION;  Surgeon: Jerline Pain, MD;  Location: [place];  Service: Cardiovascular;  Laterality: N/A;  . CORONARY ANGIOPLASTY WITH STENT PLACEMENT    . LEFT HEART CATHETERIZATION WITH CORONARY ANGIOGRAM N/A 11/10/2014   Procedure: LEFT HEART CATHETERIZATION WITH CORONARY ANGIOGRAM;  Surgeon: Wellington Hampshire, MD;  Location: [place] CATH LAB;  Service: Cardiovascular;  Laterality: N/A;  . PERCUTANEOUS CORONARY STENT INTERVENTION (PCI-S)  11/10/2014   Procedure: PERCUTANEOUS CORONARY STENT INTERVENTION (PCI-S);  Surgeon: Wellington Hampshire, MD;  Location: [facility] CATH LAB;  Service: Cardiovascular;;    There were no vitals filed for this visit.  Subjective  Assessment - 04/15/18 1104    Subjective  I am getting better every day.      Patient Stated Goals  decrease pain    Currently in Pain?  No/denies                       [facility] Adult PT Treatment/Exercise - 04/15/18 0001      Lumbar Exercises: Stretches   Active Hamstring Stretch  Left;Right;3 reps;20 seconds      Lumbar Exercises: Aerobic   Nustep  Level 1x 8 minutes  PT present to discuss progress      Lumbar Exercises: Seated   Long Arc Quad on Chair  Strengthening;Both;10 reps;2 sets;Weights    LAQ on Chair Weights (lbs)  2#    Other Seated Lumbar Exercises  B clams 2x10 reps with green TB      Knee/Hip Exercises: Standing   Heel Raises  Both;2 sets;10 reps    Hip Abduction  Stengthening;Both;2 sets;10 reps;Limitations    Abduction Limitations  2#    Hip Extension  Stengthening;Both;2 sets;10 reps;Limitations    Extension Limitations  2#      Knee/Hip Exercises: Seated   Ball Squeeze  20 with 5 second hold               PT Short Term Goals - 04/15/18 1133      PT SHORT TERM GOAL #1  Title  Pt will demo consistency and independence with HEP to increase strength and mobility.    Status  Achieved      PT SHORT TERM GOAL #3   Title  Pt will report atleast 25% decrease in low back pain with daily activity.     Status  Achieved        PT Long Term Goals - 04/01/18 1055      PT LONG TERM GOAL #1   Title  Pt will demo improved BLE strength to atleast 4/5 MMT which will improve his safety with daily activity.     Time  8    Period  Weeks    Status  New    Target Date  06/02/18      PT LONG TERM GOAL #2   Title  Pt will demo understanding of proper lifting mechanics, without excessive lumbar flexion x5 reps, to improve his safety with pulling weeds at home.     Time  8    Period  Weeks    Status  New      PT LONG TERM GOAL #3   Title  Pt will complete 5x sit to stand in less than 14 sec without UE support or increase in LBP, to reflect an  increase in LE strength and power.     Time  8    Period  Weeks    Status  New      PT LONG TERM GOAL #4   Title  Pt will complete TUG in less than 11 sec, to reflect improvements in stability with ambulation.    Time  8    Period  Weeks    Status  New      PT LONG TERM GOAL #5   Title  Pt will report atleast 60% decrease in lumbar pain with sitting and standing activity, to allow him to ride in a car to visit his grandchildren in [place].     Time  8    Period  Weeks    Status  New            Plan - 04/15/18 1134    Clinical Impression Statement  Pt reports 40% reduction in LBP since the start of care.  Pt tolerated addition of standing exercises today.  Pt required max tactile and demo cues to reduce substitution.  Pt will require further instruction before added to HEP.  Pt demonstrates instability with standing exercise and gait at times.  Pt will continue to benefit from skilled PT for LE strength, gait, flexibility and address LBP if present.      Rehab Potential  Good    PT Frequency  2x / week    PT Duration  8 weeks    PT Treatment/Interventions  ADLs/Self Care Home Management;Moist Heat;Electrical Stimulation;Cryotherapy;Therapeutic exercise;Therapeutic activities;Functional mobility training;Stair training;Balance training;Patient/family education;Manual techniques;Neuromuscular re-education;Passive range of motion;Taping;Dry needling    PT Next Visit Plan  LE strength, flexibility, core strength, manual as needed.    PT Home Exercise Plan  [id number]    Consulted and Agree with Plan of Care  Patient       Patient will benefit from skilled therapeutic intervention in order to improve the following deficits and impairments:  Decreased activity tolerance, Decreased safety awareness, Decreased strength, Impaired flexibility, Postural dysfunction, Pain, Improper body mechanics, Decreased range of motion, Increased muscle spasms, Hypomobility, Decreased endurance, Decreased  balance, Difficulty walking  Visit Diagnosis: Muscle weakness (generalized)  Acute bilateral low back pain  without sciatica  Abnormal posture  Unsteadiness on feet     Problem List Patient Active Problem List   Diagnosis Date Noted  . Tremor of both hands 02/25/2018  . Visit for monitoring Tikosyn therapy 07/16/2017  . Postherpetic neuralgia 03/01/2017  . NSTEMI (non-ST elevated myocardial infarction) ([place]) 06/24/2016  . Long term (current) use of anticoagulants [Z79.01] 11/29/2015  . Persistent atrial fibrillation ([place])   . Chronic systolic heart failure ([place]) 11/17/2015  . Basal cell carcinoma 10/20/2015  . Herpes zoster   . Encounter for therapeutic drug monitoring 02/15/2015  . Ischemic cardiomyopathy 11/25/2014  . CAD (coronary artery disease)   . Hypertension 01/18/2011  . Hyperlipidemia 01/18/2011     Sigurd Sos, PT 04/15/18 11:47 AM  [facility] [address], [place], [place], [address] Phone: [phone]   Fax:  [phone]  Name: Ricardo Valencia MRN: [id number] Date of Birth: December 12, 1928

## 2018-04-17 ENCOUNTER — Encounter: Payer: Self-pay | Admitting: Family Medicine

## 2018-04-17 DIAGNOSIS — C44319 Basal cell carcinoma of skin of other parts of face: Secondary | ICD-10-CM | POA: Diagnosis not present

## 2018-04-17 DIAGNOSIS — D0439 Carcinoma in situ of skin of other parts of face: Secondary | ICD-10-CM | POA: Diagnosis not present

## 2018-04-17 DIAGNOSIS — C4442 Squamous cell carcinoma of skin of scalp and neck: Secondary | ICD-10-CM | POA: Diagnosis not present

## 2018-04-17 DIAGNOSIS — D485 Neoplasm of uncertain behavior of skin: Secondary | ICD-10-CM | POA: Diagnosis not present

## 2018-04-17 DIAGNOSIS — L821 Other seborrheic keratosis: Secondary | ICD-10-CM | POA: Diagnosis not present

## 2018-04-17 DIAGNOSIS — D692 Other nonthrombocytopenic purpura: Secondary | ICD-10-CM | POA: Diagnosis not present

## 2018-04-22 ENCOUNTER — Ambulatory Visit: Payer: Medicare Other | Attending: Family Medicine | Admitting: Physical Therapy

## 2018-04-22 ENCOUNTER — Encounter: Payer: Self-pay | Admitting: Physical Therapy

## 2018-04-22 DIAGNOSIS — M545 Low back pain, unspecified: Secondary | ICD-10-CM

## 2018-04-22 DIAGNOSIS — R2681 Unsteadiness on feet: Secondary | ICD-10-CM

## 2018-04-22 DIAGNOSIS — R293 Abnormal posture: Secondary | ICD-10-CM | POA: Diagnosis not present

## 2018-04-22 DIAGNOSIS — M6281 Muscle weakness (generalized): Secondary | ICD-10-CM | POA: Insufficient documentation

## 2018-04-22 NOTE — Therapy (Signed)
[facility] [address] [place], [place], [address] Phone: [phone]   Fax:  [phone]  Physical Therapy Treatment  Patient Details  Name: Ricardo Valencia MRN: [id number] Date of Birth: [birth year] Referring Provider: Lawson Fiscal, MD    Encounter Date: 04/22/2018  PT End of Session - 04/22/18 1109    Visit Number  4    Date for PT Re-Evaluation  06/02/18    Authorization Type  Medicare     Authorization Time Period  04/01/18 to 06/02/18    PT Start Time  1100    PT Stop Time  1138    PT Time Calculation (min)  38 min    Activity Tolerance  Patient tolerated treatment well;No increased pain    Behavior During Therapy  WFL for tasks assessed/performed       Past Medical History:  Diagnosis Date  . CAD (coronary artery disease)    DES to mid LAD 11/10/2014  . Chronic systolic heart failure ([place])   . Hay fever    hay fever, allergies  . History of echocardiogram    Echo 3/17: EF 40%, diff HK, trivial AI, trivial MR, mod LAE, mild reduced RVSF, mod RAE  . Hyperlipidemia   . Hypertension   . Persistent atrial fibrillation [facility])     Past Surgical History:  Procedure Laterality Date  . APPENDECTOMY  1947  . CARDIOVERSION N/A 12/30/2014   Procedure: CARDIOVERSION;  Surgeon: Jerline Pain, MD;  Location: [place];  Service: Cardiovascular;  Laterality: N/A;  . CORONARY ANGIOPLASTY WITH STENT PLACEMENT    . LEFT HEART CATHETERIZATION WITH CORONARY ANGIOGRAM N/A 11/10/2014   Procedure: LEFT HEART CATHETERIZATION WITH CORONARY ANGIOGRAM;  Surgeon: Wellington Hampshire, MD;  Location: [place] CATH LAB;  Service: Cardiovascular;  Laterality: N/A;  . PERCUTANEOUS CORONARY STENT INTERVENTION (PCI-S)  11/10/2014   Procedure: PERCUTANEOUS CORONARY STENT INTERVENTION (PCI-S);  Surgeon: Wellington Hampshire, MD;  Location: [facility] CATH LAB;  Service: Cardiovascular;;    There were no vitals filed for this visit.  Subjective  Assessment - 04/22/18 1102    Subjective  Pt reports that things are getting better. He is working on his exercises consistently at home. No pain currently.     Patient Stated Goals  decrease pain    Currently in Pain?  No/denies                       [facility] Adult PT Treatment/Exercise - 04/22/18 0001      Lumbar Exercises: Seated   Long Arc Quad on Chair  Strengthening;Both;10 reps;2 sets;Weights    LAQ on Chair Weights (lbs)  4    Other Seated Lumbar Exercises  BUE press into foam roll with exhale x15 reps       Lumbar Exercises: Supine   Clam  10 reps;Limitations    Clam Limitations  abdominal brace to decrease trunk rotation     Isometric Hip Flexion  15 reps;2 seconds;Limitations    Isometric Hip Flexion Limitations  cues to breathe during exercise     Other Supine Lumbar Exercises  abdominal brace with horizontal abduction and D2 flexion each side with red TB x10 reps each    Other Supine Lumbar Exercises  hooklying chops each direction with red TB and adductor ball squeeze 2x10 reps       Knee/Hip Exercises: Stretches   Active Hamstring Stretch  5 reps;10 seconds;Both;Limitations    Active Hamstring Stretch Limitations  90/90 hold       Knee/Hip Exercises: Standing   Heel Raises  Both;2 sets;15 reps    Other Standing Knee Exercises  sidestepping along countertop with yellow TB around feet x2 trips down/back              PT Education - 04/22/18 1109    Education Details  updated HEP; technique with therex    Person(s) Educated  Patient    Methods  Explanation;Verbal cues;Handout    Comprehension  Returned demonstration;Verbalized understanding       PT Short Term Goals - 04/15/18 1133      PT SHORT TERM GOAL #1   Title  Pt will demo consistency and independence with HEP to increase strength and mobility.    Status  Achieved      PT SHORT TERM GOAL #3   Title  Pt will report atleast 25% decrease in low back pain with daily activity.     Status   Achieved        PT Long Term Goals - 04/01/18 1055      PT LONG TERM GOAL #1   Title  Pt will demo improved BLE strength to atleast 4/5 MMT which will improve his safety with daily activity.     Time  8    Period  Weeks    Status  New    Target Date  06/02/18      PT LONG TERM GOAL #2   Title  Pt will demo understanding of proper lifting mechanics, without excessive lumbar flexion x5 reps, to improve his safety with pulling weeds at home.     Time  8    Period  Weeks    Status  New      PT LONG TERM GOAL #3   Title  Pt will complete 5x sit to stand in less than 14 sec without UE support or increase in LBP, to reflect an increase in LE strength and power.     Time  8    Period  Weeks    Status  New      PT LONG TERM GOAL #4   Title  Pt will complete TUG in less than 11 sec, to reflect improvements in stability with ambulation.    Time  8    Period  Weeks    Status  New      PT LONG TERM GOAL #5   Title  Pt will report atleast 60% decrease in lumbar pain with sitting and standing activity, to allow him to ride in a car to visit his grandchildren in [place].     Time  8    Period  Weeks    Status  New            Plan - 04/22/18 1138    Clinical Impression Statement  Pt is making steady progress towards his goals, demonstrating the ability to complete sitting to/from supine transitions without increase in low back pain this session. Progressed LE strength and trunk strength, pt requiring cues to properly complete trunk diagonals and coordinate breathing. Ended session without increase in pain and progressions in his HEP were made to reflect improvements in LE strength.     Rehab Potential  Good    PT Frequency  2x / week    PT Duration  8 weeks    PT Treatment/Interventions  ADLs/Self Care Home Management;Moist Heat;Electrical Stimulation;Cryotherapy;Therapeutic exercise;Therapeutic activities;Functional mobility training;Stair training;Balance training;Patient/family  education;Manual techniques;Neuromuscular re-education;Passive range of  motion;Taping;Dry needling    PT Next Visit Plan  LE strength, flexibility, core strength, manual as needed.    PT Home Exercise Plan  [id number]    Consulted and Agree with Plan of Care  Patient       Patient will benefit from skilled therapeutic intervention in order to improve the following deficits and impairments:  Decreased activity tolerance, Decreased safety awareness, Decreased strength, Impaired flexibility, Postural dysfunction, Pain, Improper body mechanics, Decreased range of motion, Increased muscle spasms, Hypomobility, Decreased endurance, Decreased balance, Difficulty walking  Visit Diagnosis: Muscle weakness (generalized)  Acute bilateral low back pain without sciatica  Abnormal posture  Unsteadiness on feet     Problem List Patient Active Problem List   Diagnosis Date Noted  . Tremor of both hands 02/25/2018  . Visit for monitoring Tikosyn therapy 07/16/2017  . Postherpetic neuralgia 03/01/2017  . NSTEMI (non-ST elevated myocardial infarction) ([place]) 06/24/2016  . Long term (current) use of anticoagulants [Z79.01] 11/29/2015  . Persistent atrial fibrillation ([place])   . Chronic systolic heart failure ([place]) 11/17/2015  . Basal cell carcinoma 10/20/2015  . Herpes zoster   . Encounter for therapeutic drug monitoring 02/15/2015  . Ischemic cardiomyopathy 11/25/2014  . CAD (coronary artery disease)   . Hypertension 01/18/2011  . Hyperlipidemia 01/18/2011   11:40 AM,04/22/18 Sherol Dade PT, DPT [facility] [address], [place], [place], [address] Phone: [phone]   Fax:  [phone]  Name: Ricardo Valencia MRN: [id number] Date of Birth: 12/08/1928

## 2018-04-24 ENCOUNTER — Encounter: Payer: Medicare Other | Admitting: Physical Therapy

## 2018-04-29 ENCOUNTER — Encounter: Payer: Self-pay | Admitting: Physical Therapy

## 2018-04-29 ENCOUNTER — Ambulatory Visit: Payer: Medicare Other | Admitting: Physical Therapy

## 2018-04-29 DIAGNOSIS — R293 Abnormal posture: Secondary | ICD-10-CM

## 2018-04-29 DIAGNOSIS — M6281 Muscle weakness (generalized): Secondary | ICD-10-CM

## 2018-04-29 DIAGNOSIS — M545 Low back pain, unspecified: Secondary | ICD-10-CM

## 2018-04-29 DIAGNOSIS — R2681 Unsteadiness on feet: Secondary | ICD-10-CM

## 2018-04-29 NOTE — Therapy (Signed)
[facility] [address], [place], [place], [address] Phone: [phone]   Fax:  [phone]  Physical Therapy Treatment  Patient Details  Name: Ricardo Valencia MRN: [id number] Date of Birth: January 05, 1929 Referring Provider: Lawson Fiscal, MD    Encounter Date: 04/29/2018  PT End of Session - 04/29/18 1141    Visit Number  5    Date for PT Re-Evaluation  06/02/18    Authorization Type  Medicare     Authorization Time Period  04/01/18 to 06/02/18    PT Start Time  1100    PT Stop Time  1139    PT Time Calculation (min)  39 min    Activity Tolerance  Patient tolerated treatment well;No increased pain    Behavior During Therapy  WFL for tasks assessed/performed       Past Medical History:  Diagnosis Date  . CAD (coronary artery disease)    DES to mid LAD 11/10/2014  . Chronic systolic heart failure ([place])   . Hay fever    hay fever, allergies  . History of echocardiogram    Echo 3/17: EF 40%, diff HK, trivial AI, trivial MR, mod LAE, mild reduced RVSF, mod RAE  . Hyperlipidemia   . Hypertension   . Persistent atrial fibrillation [facility])     Past Surgical History:  Procedure Laterality Date  . APPENDECTOMY  1947  . CARDIOVERSION N/A 12/30/2014   Procedure: CARDIOVERSION;  Surgeon: Jerline Pain, MD;  Location: [place];  Service: Cardiovascular;  Laterality: N/A;  . CORONARY ANGIOPLASTY WITH STENT PLACEMENT    . LEFT HEART CATHETERIZATION WITH CORONARY ANGIOGRAM N/A 11/10/2014   Procedure: LEFT HEART CATHETERIZATION WITH CORONARY ANGIOGRAM;  Surgeon: Wellington Hampshire, MD;  Location: [place] CATH LAB;  Service: Cardiovascular;  Laterality: N/A;  . PERCUTANEOUS CORONARY STENT INTERVENTION (PCI-S)  11/10/2014   Procedure: PERCUTANEOUS CORONARY STENT INTERVENTION (PCI-S);  Surgeon: Wellington Hampshire, MD;  Location: [facility] C[facility] LAB;  Service: Cardiovascular;;    There were no vitals filed for this visit.  Subjective  Assessment - 04/29/18 1103    Subjective  Pt reports that things are going well. He will complete his exercises when his wife reminds him, but these are good. He feels atleast 50% improved, except he will still have pain in his back when picking up objects from the floor.     Patient Stated Goals  decrease pain    Currently in Pain?  No/denies                       [facility] Adult PT Treatment/Exercise - 04/29/18 0001      Self-Care   Self-Care  Lifting    Lifting  therapist demonstration of lifting technique without increased trunk flexion      Exercises   Exercises  Lumbar      Lumbar Exercises: Stretches   Hip Flexor Stretch  3 reps;Left;Right;20 seconds    Hip Flexor Stretch Limitations  standing lunge on steps       Lumbar Exercises: Standing   Row  Strengthening;10 reps    Theraband Level (Row)  Level 4 (Blue)    Row Limitations  x2 sets     Shoulder Extension  10 reps;Both;Strengthening    Theraband Level (Shoulder Extension)  Level 3 (Green)    Shoulder Extension Limitations  x2 sets       Lumbar Exercises: Seated   Other Seated Lumbar Exercises  BUE diagonal lifts  with red TB 2x15 reps each side     Other Seated Lumbar Exercises  seated abdominal brace with horizontal abduction red TB 2x10 reps (mirror feedback for posture correction)       Knee/Hip Exercises: Standing   Heel Raises  Both;20 reps    Hip Abduction  Stengthening;Both;2 sets;10 reps;Limitations    Abduction Limitations  2#, therapist cuing to increase hip extension     Other Standing Knee Exercises  hip flexion with BUE support and 2# ankle weights 2x10 reps each, therapist cuing to maintain elongated spine              PT Education - 04/29/18 1131    Education Details  updated HEP; technique with therex; lifting mechanics    Person(s) Educated  Patient    Methods  Explanation;Verbal cues;Tactile cues;Handout    Comprehension  Verbalized understanding;Returned demonstration        PT Short Term Goals - 04/15/18 1133      PT SHORT TERM GOAL #1   Title  Pt will demo consistency and independence with HEP to increase strength and mobility.    Status  Achieved      PT SHORT TERM GOAL #3   Title  Pt will report atleast 25% decrease in low back pain with daily activity.     Status  Achieved        PT Long Term Goals - 04/01/18 1055      PT LONG TERM GOAL #1   Title  Pt will demo improved BLE strength to atleast 4/5 MMT which will improve his safety with daily activity.     Time  8    Period  Weeks    Status  New    Target Date  06/02/18      PT LONG TERM GOAL #2   Title  Pt will demo understanding of proper lifting mechanics, without excessive lumbar flexion x5 reps, to improve his safety with pulling weeds at home.     Time  8    Period  Weeks    Status  New      PT LONG TERM GOAL #3   Title  Pt will complete 5x sit to stand in less than 14 sec without UE support or increase in LBP, to reflect an increase in LE strength and power.     Time  8    Period  Weeks    Status  New      PT LONG TERM GOAL #4   Title  Pt will complete TUG in less than 11 sec, to reflect improvements in stability with ambulation.    Time  8    Period  Weeks    Status  New      PT LONG TERM GOAL #5   Title  Pt will report atleast 60% decrease in lumbar pain with sitting and standing activity, to allow him to ride in a car to visit his grandchildren in [place].     Time  8    Period  Weeks    Status  New            Plan - 04/29/18 1142    Clinical Impression Statement  Pt is making progress, noting he is atleast 50% improved since beginning PT. His remaining issue is discomfort when flexing forward while picking something up from the floor. Therapist educated pt on the importance of avoiding excessive trunk flexion with tasks such as lifting and he was able to  demonstrate half kneel technique when getting to the floor. Progressed pt's HEP and today's exercises with  upper thoracic strengthening to improve his posture during daily tasks. Ended without any reports of low back pain.     Rehab Potential  Good    PT Frequency  2x / week    PT Duration  8 weeks    PT Treatment/Interventions  ADLs/Self Care Home Management;Moist Heat;Electrical Stimulation;Cryotherapy;Therapeutic exercise;Therapeutic activities;Functional mobility training;Stair training;Balance training;Patient/family education;Manual techniques;Neuromuscular re-education;Passive range of motion;Taping;Dry needling    PT Next Visit Plan  lifting mechanics; hamstring strengthening; squats    PT Home Exercise Plan  [id number]    Consulted and Agree with Plan of Care  Patient       Patient will benefit from skilled therapeutic intervention in order to improve the following deficits and impairments:  Decreased activity tolerance, Decreased safety awareness, Decreased strength, Impaired flexibility, Postural dysfunction, Pain, Improper body mechanics, Decreased range of motion, Increased muscle spasms, Hypomobility, Decreased endurance, Decreased balance, Difficulty walking  Visit Diagnosis: Muscle weakness (generalized)  Acute bilateral low back pain without sciatica  Abnormal posture  Unsteadiness on feet     Problem List Patient Active Problem List   Diagnosis Date Noted  . Tremor of both hands 02/25/2018  . Visit for monitoring Tikosyn therapy 07/16/2017  . Postherpetic neuralgia 03/01/2017  . NSTEMI (non-ST elevated myocardial infarction) ([place]) 06/24/2016  . Long term (current) use of anticoagulants [Z79.01] 11/29/2015  . Persistent atrial fibrillation ([place])   . Chronic systolic heart failure ([place]) 11/17/2015  . Basal cell carcinoma 10/20/2015  . Herpes zoster   . Encounter for therapeutic drug monitoring 02/15/2015  . Ischemic cardiomyopathy 11/25/2014  . CAD (coronary artery disease)   . Hypertension 01/18/2011  . Hyperlipidemia 01/18/2011    11:45 AM,04/29/18 Sherol Dade PT, DPT [place] [facility] [place]  [facility] [address], [place], [place], [address] Phone: [phone]   Fax:  [phone]  Name: Ricardo Valencia MRN: [id number] Date of Birth: 01/21/1929

## 2018-05-01 ENCOUNTER — Encounter: Payer: Medicare Other | Admitting: Physical Therapy

## 2018-05-05 ENCOUNTER — Telehealth: Payer: Self-pay | Admitting: *Deleted

## 2018-05-05 MED ORDER — GABAPENTIN 300 MG PO CAPS: 300.0000 mg | ORAL_CAPSULE | Freq: Two times a day (BID) | ORAL | 1 refills | Status: DC

## 2018-05-05 NOTE — Telephone Encounter (Signed)
[facility] - [address]  gabapentin (NEURONTIN) 300 MG capsule  Patient states that his prescription was changed from 1 tab daily to 2 tabs daily. Okay to change?

## 2018-05-05 NOTE — Telephone Encounter (Signed)
yes

## 2018-05-06 ENCOUNTER — Ambulatory Visit: Payer: Medicare Other | Admitting: Physical Therapy

## 2018-05-06 DIAGNOSIS — M6281 Muscle weakness (generalized): Secondary | ICD-10-CM | POA: Diagnosis not present

## 2018-05-06 DIAGNOSIS — R293 Abnormal posture: Secondary | ICD-10-CM

## 2018-05-06 DIAGNOSIS — M545 Low back pain, unspecified: Secondary | ICD-10-CM

## 2018-05-06 DIAGNOSIS — R2681 Unsteadiness on feet: Secondary | ICD-10-CM | POA: Diagnosis not present

## 2018-05-06 NOTE — Therapy (Signed)
[facility] [address] [place], [place], [address] Phone: [phone]   Fax:  [phone]  Physical Therapy Treatment  Patient Details  Name: Ricardo Valencia MRN: [id number] Date of Birth: Aug 05, 1929 Referring Provider: Lawson Fiscal, MD    Encounter Date: 05/06/2018  PT End of Session - 05/06/18 1138    Visit Number  6    Date for PT Re-Evaluation  06/02/18    Authorization Type  Medicare     Authorization Time Period  04/01/18 to 06/02/18    PT Start Time  1101    PT Stop Time  1139    PT Time Calculation (min)  38 min    Activity Tolerance  Patient tolerated treatment well;No increased pain    Behavior During Therapy  WFL for tasks assessed/performed       Past Medical History:  Diagnosis Date  . CAD (coronary artery disease)    DES to mid LAD 11/10/2014  . Chronic systolic heart failure ([place])   . Hay fever    hay fever, allergies  . History of echocardiogram    Echo 3/17: EF 40%, diff HK, trivial AI, trivial MR, mod LAE, mild reduced RVSF, mod RAE  . Hyperlipidemia   . Hypertension   . Persistent atrial fibrillation [facility])     Past Surgical History:  Procedure Laterality Date  . APPENDECTOMY  1947  . CARDIOVERSION N/A 12/30/2014   Procedure: CARDIOVERSION;  Surgeon: Jerline Pain, MD;  Location: [place];  Service: Cardiovascular;  Laterality: N/A;  . CORONARY ANGIOPLASTY WITH STENT PLACEMENT    . LEFT HEART CATHETERIZATION WITH CORONARY ANGIOGRAM N/A 11/10/2014   Procedure: LEFT HEART CATHETERIZATION WITH CORONARY ANGIOGRAM;  Surgeon: Wellington Hampshire, MD;  Location: [place] CATH LAB;  Service: Cardiovascular;  Laterality: N/A;  . PERCUTANEOUS CORONARY STENT INTERVENTION (PCI-S)  11/10/2014   Procedure: PERCUTANEOUS CORONARY STENT INTERVENTION (PCI-S);  Surgeon: Wellington Hampshire, MD;  Location[facility] CATH LAB;  Service: Cardiovascular;;    There were no vitals filed for this visit.  Subjective  Assessment - 05/06/18 1103    Subjective  Pt states things are good. He feels a little better every visit.     Patient Stated Goals  decrease pain    Currently in Pain?  No/denies                       [facility] Adult PT Treatment/Exercise - 05/06/18 0001      Therapeutic Activites    Therapeutic Activities  --    Lifting  x5 reps lifting box from chair, therapist providing instruction on proper mechanics       Exercises   Exercises  Lumbar      Lumbar Exercises: Standing   Row  Strengthening;15 reps    Theraband Level (Row)  Level 4 (Blue)    Row Limitations  x2 set     Shoulder Extension  Strengthening;Both;15 reps    Theraband Level (Shoulder Extension)  Level 3 (Green)    Shoulder Extension Limitations  x2 set    Other Standing Lumbar Exercises  NBOS on foam pad with blue weigthed ball trunk rotation x10 reps each direction      Lumbar Exercises: Seated   Other Seated Lumbar Exercises  BUE diagonal lifts with red TB 2x15 reps each side     Other Seated Lumbar Exercises  seated abdominal brace with horizontal abduction red TB 2x10 reps      Lumbar  Exercises: Supine   Other Supine Lumbar Exercises  abdominal brace with alternating UE flexion with yellow TB x15 reps each     Other Supine Lumbar Exercises  BUE pressdown with straight leg raise x10 reps each       Knee/Hip Exercises: Seated   Sit to Sand  2 sets;10 reps   standing on foam pad      Manual Therapy   Manual therapy comments  passive glute stretch x10 reps, passive hamstring stretch x60 sec             PT Education - 05/06/18 1244    Education Details  lifting mechanics; technique with therex    Person(s) Educated  Patient    Methods  Explanation;Verbal cues;Tactile cues;Demonstration    Comprehension  Verbalized understanding;Returned demonstration       PT Short Term Goals - 04/15/18 1133      PT SHORT TERM GOAL #1   Title  Pt will demo consistency and independence with HEP to  increase strength and mobility.    Status  Achieved      PT SHORT TERM GOAL #3   Title  Pt will report atleast 25% decrease in low back pain with daily activity.     Status  Achieved        PT Long Term Goals - 04/01/18 1055      PT LONG TERM GOAL #1   Title  Pt will demo improved BLE strength to atleast 4/5 MMT which will improve his safety with daily activity.     Time  8    Period  Weeks    Status  New    Target Date  06/02/18      PT LONG TERM GOAL #2   Title  Pt will demo understanding of proper lifting mechanics, without excessive lumbar flexion x5 reps, to improve his safety with pulling weeds at home.     Time  8    Period  Weeks    Status  New      PT LONG TERM GOAL #3   Title  Pt will complete 5x sit to stand in less than 14 sec without UE support or increase in LBP, to reflect an increase in LE strength and power.     Time  8    Period  Weeks    Status  New      PT LONG TERM GOAL #4   Title  Pt will complete TUG in less than 11 sec, to reflect improvements in stability with ambulation.    Time  8    Period  Weeks    Status  New      PT LONG TERM GOAL #5   Title  Pt will report atleast 60% decrease in lumbar pain with sitting and standing activity, to allow him to ride in a car to visit his grandchildren in [place].     Time  8    Period  Weeks    Status  New            Plan - 05/06/18 1147    Clinical Impression Statement  Pt continues to do well, without reports of pain during today's session. Pt had no issues with therex progressions and required decreased cuing compared to his last session. Therapist educated pt on proper lifting mechanics and he was able to demonstrate understanding of this without reports of back pain. Will continue with current POC.    Rehab Potential  Good  PT Frequency  2x / week    PT Duration  8 weeks    PT Treatment/Interventions  ADLs/Self Care Home Management;Moist Heat;Electrical Stimulation;Cryotherapy;Therapeutic  exercise;Therapeutic activities;Functional mobility training;Stair training;Balance training;Patient/family education;Manual techniques;Neuromuscular re-education;Passive range of motion;Taping;Dry needling    PT Next Visit Plan  lifting mechanics from floor; hamstring strengthening; squats    PT Home Exercise Plan  [email or web address]    Consulted and Agree with Plan of Care  Patient       Patient will benefit from skilled therapeutic intervention in order to improve the following deficits and impairments:  Decreased activity tolerance, Decreased safety awareness, Decreased strength, Impaired flexibility, Postural dysfunction, Pain, Improper body mechanics, Decreased range of motion, Increased muscle spasms, Hypomobility, Decreased endurance, Decreased balance, Difficulty walking  Visit Diagnosis: Muscle weakness (generalized)  Acute bilateral low back pain without sciatica  Abnormal posture     Problem List Patient Active Problem List   Diagnosis Date Noted  . Tremor of both hands 02/25/2018  . Visit for monitoring Tikosyn therapy 07/16/2017  . Postherpetic neuralgia 03/01/2017  . NSTEMI (non-ST elevated myocardial infarction) ([place]) 06/24/2016  . Long term (current) use of anticoagulants [Z79.01] 11/29/2015  . Persistent atrial fibrillation ([place])   . Chronic systolic heart failure ([place]) 11/17/2015  . Basal cell carcinoma 10/20/2015  . Herpes zoster   . Encounter for therapeutic drug monitoring 02/15/2015  . Ischemic cardiomyopathy 11/25/2014  . CAD (coronary artery disease)   . Hypertension 01/18/2011  . Hyperlipidemia 01/18/2011      1:16 PM,05/06/18 Sherol Dade PT, DPT [place] at [place]  [facility] [address] [place], [place], [address] Phone: [phone]   Fax:  [phone]  Name: KEYANTE DURIO MRN: [id number] Date of Birth: February 25, 1929

## 2018-05-08 ENCOUNTER — Ambulatory Visit (INDEPENDENT_AMBULATORY_CARE_PROVIDER_SITE_OTHER): Payer: Medicare Other | Admitting: *Deleted

## 2018-05-08 ENCOUNTER — Encounter: Payer: Medicare Other | Admitting: Physical Therapy

## 2018-05-08 DIAGNOSIS — Z7901 Long term (current) use of anticoagulants: Secondary | ICD-10-CM

## 2018-05-08 DIAGNOSIS — Z5181 Encounter for therapeutic drug level monitoring: Secondary | ICD-10-CM

## 2018-05-08 LAB — POCT INR: INR: 2.2 (ref 2.0–3.0)

## 2018-05-08 NOTE — Patient Instructions (Signed)
Description   Continue on same dosage 1/2 tablet daily except 1 tablet on Mondays, Wednesdays and Fridays.  Recheck INR in 6 weeks. Call us with any medication changes or concern #[phone] [facility], Main # [phone].       

## 2018-05-13 ENCOUNTER — Encounter: Payer: Self-pay | Admitting: Physical Therapy

## 2018-05-13 ENCOUNTER — Ambulatory Visit: Payer: Medicare Other | Admitting: Physical Therapy

## 2018-05-13 DIAGNOSIS — M545 Low back pain, unspecified: Secondary | ICD-10-CM

## 2018-05-13 DIAGNOSIS — R293 Abnormal posture: Secondary | ICD-10-CM

## 2018-05-13 DIAGNOSIS — M6281 Muscle weakness (generalized): Secondary | ICD-10-CM | POA: Diagnosis not present

## 2018-05-13 DIAGNOSIS — R2681 Unsteadiness on feet: Secondary | ICD-10-CM

## 2018-05-13 NOTE — Patient Instructions (Signed)
Access Code: [email or web address]  URL: [email or web address]  Date: 05/13/2018  Prepared by: Elly Modena   Exercises  Hip Flexion Stretch - 10 reps - 10 hold - 1x daily - 7x weekly  Sitting to Supine Roll - 10 reps - 3 sets - 1x daily - 7x weekly  Standing Hip Abduction with Resistance at Ankles - 10 reps - 2 sets - 1x daily - 7x weekly  Standing Shoulder Row with Anchored Resistance - 10 reps - 3 sets - 1x daily - 7x weekly  Hip Extension with Resistance Loop - 10 reps - 2 sets - 1x daily - 7x weekly    [facility] [address], [place], [place] [address] Phone # [phone] Fax [phone]

## 2018-05-13 NOTE — Therapy (Addendum)
[facility] [address] [place], [place], [address] Phone: [phone]   Fax:  [phone]  Physical Therapy Treatment/Discharge  Patient Details  Name: Ricardo Valencia MRN: [id number] Date of Birth: Jul 27, 1929 Referring Provider: Lawson Fiscal, MD    Encounter Date: 05/13/2018  PT End of Session - 05/13/18 1056    Visit Number  7    Date for PT Re-Evaluation  06/02/18    Authorization Type  Medicare     Authorization Time Period  04/01/18 to 06/02/18    PT Start Time  1051    PT Stop Time  1130    PT Time Calculation (min)  39 min    Activity Tolerance  Patient tolerated treatment well;No increased pain    Behavior During Therapy  WFL for tasks assessed/performed       Past Medical History:  Diagnosis Date  . CAD (coronary artery disease)    DES to mid LAD 11/10/2014  . Chronic systolic heart failure ([place])   . Hay fever    hay fever, allergies  . History of echocardiogram    Echo 3/17: EF 40%, diff HK, trivial AI, trivial MR, mod LAE, mild reduced RVSF, mod RAE  . Hyperlipidemia   . Hypertension   . Persistent atrial fibrillation [facility])     Past Surgical History:  Procedure Laterality Date  . APPENDECTOMY  1947  . CARDIOVERSION N/A 12/30/2014   Procedure: CARDIOVERSION;  Surgeon: Jerline Pain, MD;  Location: [place];  Service: Cardiovascular;  Laterality: N/A;  . CORONARY ANGIOPLASTY WITH STENT PLACEMENT    . LEFT HEART CATHETERIZATION WITH CORONARY ANGIOGRAM N/A 11/10/2014   Procedure: LEFT HEART CATHETERIZATION WITH CORONARY ANGIOGRAM;  Surgeon: Wellington Hampshire, MD;  Location: [place] CATH LAB;  Service: Cardiovascular;  Laterality: N/A;  . PERCUTANEOUS CORONARY STENT INTERVENTION (PCI-S)  11/10/2014   Procedure: PERCUTANEOUS CORONARY STENT INTERVENTION (PCI-S);  Surgeon: Wellington Hampshire, MD;  Location: [facility] CATH LAB;  Service: Cardiovascular;;    There were no vitals filed for this  visit.  Subjective Assessment - 05/13/18 1134    Subjective  Pt has no complaints at this time.     Patient Stated Goals  decrease pain    Currently in Pain?  No/denies         [facility] PT Assessment - 05/13/18 0001      Transfers   Five time sit to stand comments   12 sec, no UE                    [facility] Adult PT Treatment/Exercise - 05/13/18 0001      Exercises   Exercises  Knee/Hip      Lumbar Exercises: Stretches   Passive Hamstring Stretch  2 reps;Left;Right;30 seconds    Passive Hamstring Stretch Limitations  LE on step     Hip Flexor Stretch  3 reps;Left;Right;20 seconds    Piriformis Stretch  2 reps;Left;Right;30 seconds    Piriformis Stretch Limitations  seated       Lumbar Exercises: Aerobic   Nustep  L3 x6 min, PT present to discuss HEP updates       Lumbar Exercises: Standing   Row  Strengthening;15 reps    Theraband Level (Row)  Level 4 (Blue)    Row Limitations  2 sets    Shoulder Extension  Strengthening;Both;15 reps    Theraband Level (Shoulder Extension)  Level 3 (Green)    Shoulder Extension Limitations  x2 sets  Knee/Hip Exercises: Standing   Hip Abduction  Stengthening;Both;2 sets;10 reps;Knee straight    Abduction Limitations  yellow TB around ankles, cues to maintain upright posture     Hip Extension  Right;Left;2 sets;10 reps;Knee straight    Extension Limitations  yellow TB around ankles       Knee/Hip Exercises: Seated   Heel Slides  Both;Strengthening;2 sets;15 reps    Heel Slides Limitations  1st set red TB, 2nd set green TB              PT Education - 05/13/18 1129    Education Details  improvements in 5x sit to stand, updated HEP    Person(s) Educated  Patient    Methods  Explanation;Verbal cues;Handout    Comprehension  Returned demonstration;Verbalized understanding       PT Short Term Goals - 04/15/18 1133      PT SHORT TERM GOAL #1   Title  Pt will demo consistency and independence with HEP to increase  strength and mobility.    Status  Achieved      PT SHORT TERM GOAL #3   Title  Pt will report atleast 25% decrease in low back pain with daily activity.     Status  Achieved        PT Long Term Goals - 05/13/18 1057      PT LONG TERM GOAL #1   Title  Pt will demo improved BLE strength to atleast 4/5 MMT which will improve his safety with daily activity.     Time  8    Period  Weeks    Status  New      PT LONG TERM GOAL #2   Title  Pt will demo understanding of proper lifting mechanics, without excessive lumbar flexion x5 reps, to improve his safety with pulling weeds at home.     Time  8    Period  Weeks    Status  New      PT LONG TERM GOAL #3   Title  Pt will complete 5x sit to stand in less than 14 sec without UE support or increase in LBP, to reflect an increase in LE strength and power.     Time  8    Period  Weeks    Status  New      PT LONG TERM GOAL #4   Title  Pt will complete TUG in less than 11 sec, to reflect improvements in stability with ambulation.    Time  8    Period  Weeks    Status  New      PT LONG TERM GOAL #5   Title  Pt will report atleast 60% decrease in lumbar pain with sitting and standing activity, to allow him to ride in a car to visit his grandchildren in [place].     Time  8    Period  Weeks    Status  New            Plan - 05/13/18 1130    Clinical Impression Statement  Pt is making steady progress towards his goals. His 5x sit to stand improved to 12 sec without UE support, which is a 5 sec improvement from his initial evaluation. Session continued with therex to promote LE strength, and HEP updates were made to reflect his improvements in activity participation during his session. Pt continues to require cues for improved posture, however he reports no back pain during exercises. Will continue with  current POC.     Rehab Potential  Good    PT Frequency  2x / week    PT Duration  8 weeks    PT Treatment/Interventions  ADLs/Self  Care Home Management;Moist Heat;Electrical Stimulation;Cryotherapy;Therapeutic exercise;Therapeutic activities;Functional mobility training;Stair training;Balance training;Patient/family education;Manual techniques;Neuromuscular re-education;Passive range of motion;Taping;Dry needling    PT Next Visit Plan  lifting mechanics from floor; hamstring strengthening; LE strength and squats    PT Home Exercise Plan  [email or web address]    Consulted and Agree with Plan of Care  Patient       Patient will benefit from skilled therapeutic intervention in order to improve the following deficits and impairments:  Decreased activity tolerance, Decreased safety awareness, Decreased strength, Impaired flexibility, Postural dysfunction, Pain, Improper body mechanics, Decreased range of motion, Increased muscle spasms, Hypomobility, Decreased endurance, Decreased balance, Difficulty walking  Visit Diagnosis: Muscle weakness (generalized)  Acute bilateral low back pain without sciatica  Abnormal posture  Unsteadiness on feet     Problem List Patient Active Problem List   Diagnosis Date Noted  . Tremor of both hands 02/25/2018  . Visit for monitoring Tikosyn therapy 07/16/2017  . Postherpetic neuralgia 03/01/2017  . NSTEMI (non-ST elevated myocardial infarction) ([place]) 06/24/2016  . Long term (current) use of anticoagulants [Z79.01] 11/29/2015  . Persistent atrial fibrillation ([place])   . Chronic systolic heart failure ([place]) 11/17/2015  . Basal cell carcinoma 10/20/2015  . Herpes zoster   . Encounter for therapeutic drug monitoring 02/15/2015  . Ischemic cardiomyopathy 11/25/2014  . CAD (coronary artery disease)   . Hypertension 01/18/2011  . Hyperlipidemia 01/18/2011   11:34 AM,05/13/18 Sherol Dade PT, DPT [place] at [place]  [facility] [address] [place], [place], [address] Phone:  [phone]   Fax:  [phone]  Name: Ricardo Valencia MRN: [id number] Date of Birth: [birth year]   *addendum to resolve episode of care and d/c pt from PT.   [place]  Visits from Start of Care: 7  Current functional level related to goals / functional outcomes: See above for more details    Remaining deficits: See above for more details   Education / Equipment: See above for more details   Plan: Patient agrees to discharge.  Patient goals were partially met. Patient is being discharged due to being pleased with the current functional level.  ?????        Pt feels pleased with his progress and is agreeable to d/c from PT at this time.   11:30 AM,05/29/18 [employer], [place] at [place]

## 2018-05-15 ENCOUNTER — Encounter: Payer: Medicare Other | Admitting: Physical Therapy

## 2018-05-20 ENCOUNTER — Ambulatory Visit: Payer: Medicare Other | Admitting: Physical Therapy

## 2018-05-20 ENCOUNTER — Other Ambulatory Visit: Payer: Self-pay | Admitting: Cardiovascular Disease

## 2018-05-20 NOTE — Telephone Encounter (Signed)
This is Dr. Aundra Dubin pt, CHF pt.

## 2018-05-27 ENCOUNTER — Ambulatory Visit: Payer: Medicare Other | Admitting: Physical Therapy

## 2018-05-27 ENCOUNTER — Other Ambulatory Visit: Payer: Self-pay | Admitting: Cardiovascular Disease

## 2018-05-27 MED ORDER — ATORVASTATIN CALCIUM 80 MG PO TABS: 80.0000 mg | ORAL_TABLET | Freq: Every day | ORAL | 0 refills | Status: DC

## 2018-06-03 ENCOUNTER — Ambulatory Visit: Payer: Medicare Other | Admitting: Family Medicine

## 2018-06-09 ENCOUNTER — Other Ambulatory Visit: Payer: Self-pay | Admitting: Cardiovascular Disease

## 2018-06-09 NOTE — Telephone Encounter (Signed)
This is a CHF pt 

## 2018-06-10 ENCOUNTER — Encounter

## 2018-06-10 ENCOUNTER — Ambulatory Visit (INDEPENDENT_AMBULATORY_CARE_PROVIDER_SITE_OTHER): Payer: Medicare Other | Admitting: Neurology

## 2018-06-10 ENCOUNTER — Encounter: Payer: Self-pay | Admitting: Neurology

## 2018-06-10 VITALS — BP 140/65 | HR 49 | Ht 69.0 in | Wt 189.0 lb

## 2018-06-10 DIAGNOSIS — I480 Paroxysmal atrial fibrillation: Secondary | ICD-10-CM | POA: Diagnosis not present

## 2018-06-10 DIAGNOSIS — G620 Drug-induced polyneuropathy: Secondary | ICD-10-CM | POA: Diagnosis not present

## 2018-06-10 DIAGNOSIS — I2510 Atherosclerotic heart disease of native coronary artery without angina pectoris: Secondary | ICD-10-CM | POA: Diagnosis not present

## 2018-06-10 DIAGNOSIS — R259 Unspecified abnormal involuntary movements: Secondary | ICD-10-CM | POA: Insufficient documentation

## 2018-06-10 DIAGNOSIS — R42 Dizziness and giddiness: Secondary | ICD-10-CM | POA: Diagnosis not present

## 2018-06-10 DIAGNOSIS — T462X5A Adverse effect of other antidysrhythmic drugs, initial encounter: Secondary | ICD-10-CM

## 2018-06-10 MED ORDER — PRIMIDONE 50 MG PO TABS: ORAL_TABLET | ORAL | 5 refills | Status: DC

## 2018-06-10 NOTE — Patient Instructions (Signed)
Primidone tablets What is this medicine? PRIMIDONE (PRI mi done) is a barbiturate. This medicine is used to control seizures in certain types of epilepsy. It is not for use in absence (petit mal) seizures. This medicine may be used for other purposes; ask your health care provider or pharmacist if you have questions. COMMON BRAND NAME(S): Mysoline What should I tell my health care provider before I take this medicine? They need to know if you have any of these conditions: -kidney disease -liver disease -porphyria -suicidal thoughts, plans, or attempt; a previous suicide attempt by you or a family member -an unusual or allergic reaction to primidone, phenobarbital, other barbiturates or seizure medications, other medicines, foods, dyes, or preservatives -pregnant or trying to get pregnant -breast-feeding How should I use this medicine? Take this medicine by mouth with a glass of water. Follow the directions on the prescription label. Take your doses at regular intervals. Do not take your medicine more often than directed. Do not stop taking except on the advice of your doctor or health care professional. A special MedGuide will be given to you by the pharmacist with each prescription and refill. Be sure to read this information carefully each time. Contact your pediatrician or health care professional regarding the use of this medication in children. Special care may be needed. While this drug may be prescribed for children for selected conditions, precautions do apply. Overdosage: If you think you have taken too much of this medicine contact a poison control center or emergency room at once. NOTE: This medicine is only for you. Do not share this medicine with others. What if I miss a dose? If you miss a dose, take it as soon as you can. If it is almost time for your next dose, take only that dose. Do not take double or extra doses. What may interact with this medicine? Do not take this  medicine with any of the following medications: -voriconazole This medicine may also interact with the following medications: -cancer-treating medications -cyclosporine -disopyramide -doxycycline -male hormones, including contraceptive or birth control pills -medicines for mental depression, anxiety or other mood problems -medicines for treating HIV infection or AIDS -modafinil -prescription pain medications -quinidine -warfarin This list may not describe all possible interactions. Give your health care provider a list of all the medicines, herbs, non-prescription drugs, or dietary supplements you use. Also tell them if you smoke, drink alcohol, or use illegal drugs. Some items may interact with your medicine. What should I watch for while using this medicine? Visit your doctor or health care professional for regular checks on your progress. It may be 2 to 3 weeks before you see the full effects of this medicine. Do not suddenly stop taking this medicine, you may increase the risk of seizures. Your doctor or health care professional may want to gradually reduce the dose. Wear a medical identification bracelet or chain to say you have epilepsy, and carry a card that lists all your medications. You may get drowsy or dizzy. Do not drive, use machinery, or do anything that needs mental alertness until you know how this medicine affects you. Do not stand or sit up quickly, especially if you are an older patient. This reduces the risk of dizzy or fainting spells. Alcohol may interfere with the effect of this medicine. Avoid alcoholic drinks. Birth control pills may not work properly while you are taking this medicine. Talk to your doctor about using an extra method of birth control. The use of this medicine   may increase the chance of suicidal thoughts or actions. Pay special attention to how you are responding while on this medicine. Any worsening of mood, or thoughts of suicide or dying should be  reported to your health care professional right away. Women who become pregnant while using this medicine may enroll in the [place] Antiepileptic Drug Pregnancy Registry by calling 1-888-233-2334. This registry collects information about the safety of antiepileptic drug use during pregnancy. What side effects may I notice from receiving this medicine? Side effects that you should report to your doctor or health care professional as soon as possible: -allergic reactions like skin rash, itching or hives, swelling of the face, lips, or tongue -blurred, double vision, or uncontrollable rolling or movements of the eyes -redness, blistering, peeling or loosening of the skin, including inside the mouth -shortness of breath or difficulty breathing -unusual excitement or restlessness, more likely in children and the elderly -unusually weak or tired -worsening of mood, thoughts or actions of suicide or dying Side effects that usually do not require medical attention (report to your doctor or health care professional if they continue or are bothersome): -clumsiness, unsteadiness, or a hang-over effect -decreased sexual ability -dizziness, drowsiness -loss of appetite -nausea or vomiting This list may not describe all possible side effects. Call your doctor for medical advice about side effects. You may report side effects to FDA at 1-800-FDA-1088. Where should I keep my medicine? Keep out of the reach of children. This medicine may cause accidental overdose and death if it taken by other adults, children, or pets. Mix any unused medicine with a substance like cat litter or coffee grounds. Then throw the medicine away in a sealed container like a sealed bag or a coffee can with a lid. Do not use the medicine after the expiration date. Store at room temperature between 15 and 30 degrees C (59 and 86 degrees F). NOTE: This sheet is a summary. It may not cover all possible information. If you have  questions about this medicine, talk to your doctor, pharmacist, or health care provider.  2018 Elsevier/Gold Standard (2013-10-30 15:40:08)  

## 2018-06-10 NOTE — Progress Notes (Signed)
Provider:  Larey Seat, M D  Primary Care Physician:  Eulas Post, MD   Referring Provider: Eulas Post, MD     Chief Complaint  Patient presents with  . New Patient (Initial Visit)    pt with wife, rm 71, diagnosed with benign tremors 8-10 years ago. the tremors have gotten significantly worse over the last year. hard to hold a cup without the drink spilling. noticed in hands bilaterally. hard for him to write.     HPI:  Ricardo Valencia is a 84 y.o. male patient of [place] descent whose wife has ben a patient here. He is seen on 06-10-2018 here  in a referral from Dr. Rickard Rhymes an evaluation of tremors.  Ricardo Valencia was diagnosed around the year 2012 or so with essential tremor by his then from her care physician, Dr. Stevie Kern. He reports that over the last 2 to 3 years he has noted a significant increase in amplitude, probably around March 2017.  His tremor is asymmetrical and affects the right dominant hand at rest, there is also a reduced grip strength in the right hand.  More in the left hand there is a tremor only noted when he is performing an action. He is soft spoken and reports changes in penmanship. He has a little droop in the right shoulder without cog-wheeling.   Chief complaint according to patient : tremor bothers me    Medical history -the patient's medical history is positive for coronary artery disease, chronic systolic heart failure, he had an echocardiogram in March 2017 which showed a reduced ejection fraction of only 40% trivial regurgitation of the aortic valve, trivial regurgitation of blood in the mitral valve.  He had a moderate enlargement of the left atrium, hypertension and persistent atrial fibrillation.  He status post cardioversion the last time attempted in April 2016 by Dr. Marlou Porch. had a coronary angioplasty with stent placement in February 2016, this he has some chronic lumbar spine and lower back pain and has been enrolled  by Dr. Jarold Song and physical therapy for back pain and also to help with his fall risk reduction.  Family sleep history: mother had TBC -died at 66, father at 64 - both of cancer, none had tremors. His younger brother is 14 and has no tremor, but is oxygen dependent.    Social history:  Married, non smoker, light beer drinker, 1-2 / day. No hard liquor. He drinks one cup of coffee in AM daily. No iced tea, hot tea nor soda.  Retired from [employer]. He retired to work for the better [employer].  - the couple has 2 children- daughters age 75, 32 .     Review of Systems: Out of a complete 14 system review, the patient complains of only the following symptoms, and all other reviewed systems are negative. He snores, he sleeps a lot. Tremor. No naps in daytime.  sleeps 8 hours nightly , " frequent "bathroom breaks- 2- times.  Epworth score: 12/ 24  , Fatigue severity score n/a  , depression score n.a  Social History   Socioeconomic History  . Marital status: Married    Spouse name: Not on file  . Number of children: Not on file  . Years of education: Not on file  . Highest education level: Not on file  Occupational History  . Not on file  Social Needs  . Financial resource strain: Not on file  . Food  insecurity:    Worry: Not on file    Inability: Not on file  . Transportation needs:    Medical: Not on file    Non-medical: Not on file  Tobacco Use  . Smoking status: Never Smoker  . Smokeless tobacco: Never Used  . Tobacco comment: "smoked when I was 22"  Substance and Sexual Activity  . Alcohol use: Yes    Alcohol/week: 7.0 - 14.0 standard drinks    Types: 7 - 14 Cans of beer per week  . Drug use: No  . Sexual activity: Not Currently  Lifestyle  . Physical activity:    Days per week: Not on file    Minutes per session: Not on file  . Stress: Not on file  Relationships  . Social connections:    Talks on phone: Not on file    Gets  together: Not on file    Attends religious service: Not on file    Active member of club or organization: Not on file    Attends meetings of clubs or organizations: Not on file    Relationship status: Not on file  . Intimate partner violence:    Fear of current or ex partner: Not on file    Emotionally abused: Not on file    Physically abused: Not on file    Forced sexual activity: Not on file  Other Topics Concern  . Not on file  Social History Narrative  . Not on file    Family History  Problem Relation Age of Onset  . Cancer Mother        breast  . Cancer Father        colon    Past Medical History:  Diagnosis Date  . CAD (coronary artery disease)    DES to mid LAD 11/10/2014  . Chronic systolic heart failure ([place])   . Hay fever    hay fever, allergies  . History of echocardiogram    Echo 3/17: EF 40%, diff HK, trivial AI, trivial MR, mod LAE, mild reduced RVSF, mod RAE  . Hyperlipidemia   . Hypertension   . Persistent atrial fibrillation [facility])     Past Surgical History:  Procedure Laterality Date  . APPENDECTOMY  1947  . CARDIOVERSION N/A 12/30/2014   Procedure: CARDIOVERSION;  Surgeon: Jerline Pain, MD;  Location: [facility];  Service: Cardiovascular;  Laterality: N/A;  . CORONARY ANGIOPLASTY WITH STENT PLACEMENT    . LEFT HEART CATHETERIZATION WITH CORONARY ANGIOGRAM N/A 11/10/2014   Procedure: LEFT HEART CATHETERIZATION WITH CORONARY ANGIOGRAM;  Surgeon: Wellington Hampshire, MD;  Location: [facility] CATH LAB;  Service: Cardiovascular;  Laterality: N/A;  . PERCUTANEOUS CORONARY STENT INTERVENTION (PCI-S)  11/10/2014   Procedure: PERCUTANEOUS CORONARY STENT INTERVENTION (PCI-S);  Surgeon: Wellington Hampshire, MD;  Location: [facility] CATH LAB;  Service: Cardiovascular;;    Current Outpatient Medications  Medication Sig Dispense Refill  . acetaminophen (TYLENOL) 500 MG tablet Take 500 mg by mouth every 4 (four) hours as needed for fever (pain).    Marland Kitchen amiodarone (PACERONE) 200 MG  tablet TAKE 1 TABLET BY MOUTH ONCE DAILY 30 tablet 3  . Ascorbic Acid (VITAMIN C) 1000 MG tablet Take 1,000 mg by mouth daily.    Marland Kitchen atorvastatin (LIPITOR) 40 MG tablet Take 40 mg by mouth daily at 6 PM.    . furosemide (LASIX) 40 MG tablet Take 1 tablet (40 mg total) by mouth daily. (Patient taking differently: Take 40 mg by mouth 2 (two) times  daily. ) 30 tablet 0  . gabapentin (NEURONTIN) 300 MG capsule Take 1 capsule (300 mg total) by mouth 2 (two) times daily. 180 capsule 1  . lisinopril (PRINIVIL,ZESTRIL) 5 MG tablet Take 2.5 mg by mouth daily.    . metoprolol succinate (TOPROL-XL) 50 MG 24 hr tablet Take 1 tablet (50 mg total) by mouth daily. (Patient taking differently: Take 50 mg by mouth 2 (two) times daily. ) 60 tablet 3  . metoprolol succinate (TOPROL-XL) 50 MG 24 hr tablet Take 1 tablet (50 mg total) by mouth daily. (Patient taking differently: Take 25 mg by mouth daily. ) 30 tablet 3  . nitroGLYCERIN (NITROSTAT) 0.4 MG SL tablet Place 1 tablet (0.4 mg total) under the tongue every 5 (five) minutes x 3 doses as needed for chest pain. 25 tablet 2  . potassium chloride (K-DUR,KLOR-CON) 10 MEQ tablet Take 1 tablet (10 mEq total) by mouth daily. Please make yearly appt with Dr. Angelena Form for August for future refills. 1st attempt 90 tablet 0  . warfarin (COUMADIN) 5 MG tablet TAKE AS DIRECTED BY MOUTH BY COUMADIN CLINIC (Patient taking differently: 1/2 tablet sat, sun, tues and thur. And then 1 tablet mon,wed,fri) 90 tablet 1   No current facility-administered medications for this visit.     Allergies as of 06/10/2018  . (No Known Allergies)    Vitals: BP 140/65   Pulse (!) 49   Ht 5\' 9"  (1.753 m)   Wt 189 lb (85.7 kg)   BMI 27.91 kg/m  Last Weight:  Wt Readings from Last 1 Encounters:  06/10/18 189 lb (85.7 kg)   WUX:LKGM mass index is 27.91 kg/m.     Last Height:   Ht Readings from Last 1 Encounters:  06/10/18 5\' 9"  (1.753 m)    irregular heart rate / feeling faint with  orthostasis.    Physical exam:  General: The patient is awake, alert and appears not in acute distress. The patient is well groomed. Head: Normocephalic, atraumatic. Neck is supple. Mallampati 3  neck circumference:16.5. Nasal airflow patent   Cardiovascular:  Regular rate and rhythm, without  murmurs or carotid bruit, and without distended neck veins.Respiratory: Lungs are clear to auscultation.Skin:  Without evidence of edema, or rashTrunk: BMI is 28. The patient's posture is slightly stooped.  Neurologic exam : The patient is awake and alert, oriented to place and time.  Attention span & concentration ability appears normal. Speech is fluent,  without dysarthria, dysphonia or aphasia. Mood and affect are appropriate.  Cranial nerves:Pupils are equal and briskly reactive to light. Funduscopic exam without  evidence of pallor or edema. Extraocular movements  in vertical and horizontal planes intact and without nystagmus. Visual fields by finger perimetry are intact.Hearing to finger rub intact.  Facial sensation intact to fine touch. Facial motor strength is symmetric- there is a slight reduction in mimik. Tongue and uvula move midline. No dysphagia. Shoulder shrug was symmetrical.   Motor exam:  Normal muscle bulk and symmetric strength in all extremities. Grip strength is slightly weaker over the right hand.   Sensory:  Fine touch, pinprick and vibration were present  in upper extremities.  His toes are stinging, and feel "asleep". Proprioception tested in the upper extremities was normal. Coordination: Rapid alternating movements in the fingers/hands was normal. Finger-to-nose maneuver With  evidence of  dysmetria and Tremor. Resting tremor right over left with large amplitude. .  Gait and station: Patient walks without assistive device - and is able unassisted to climb  up to the exam table. Strength within normal limits. He walks slow , and without shuffling here. Bilateral arm swing without  hand tremor- he has a drooping right shoulder, right arm stiffness    Stance is stable and normal. Tandem gait deferred . Turns with 4 Steps.  Deep tendon reflexes: in the  upper and lower extremities are symmetric and intact.   Assessment:  After physical and neurologic examination, review of laboratory studies,  Personal review of imaging studies, reports of other /same  Imaging studies, results of polysomnography and / or neurophysiology testing and pre-existing records as far as provided in visit., my assessment is :   Ricardo Valencia is a gentleman with a history of atrial fibrillation on amiodarone, reduced ejection fraction, and presumed essential tremor.    1)His tremor amplitude has increased and to some degree affect not just the right dominant hand but also his left.  There is some grip strength loss with the right hand.  I have noticed during the walking exam that his right shoulder seems to be droopier and while he provides some arm swing the right arm seems to be more rigid.  This could well be an early sign of Parkinson disease however he does not present with cogwheeling, dysphagia or dysphonia, and his orthostatic lightheadedness has been attributed to bradycardia.  2) the patient does snore but he does not feel excessively daytime sleepy, he only has nocturia only twice at night which is age-appropriate.  He feels that he is rested and restored when he wakes up in the morning.    3)There is some orthostatic hypotension that affects him in the form of lightheadedness when he initially gets up in the morning.  He has had no recent fainting spells.  He has fallen 5 times in 2019. Always on uneven surfaces- this can be neuropathy.    The patient was advised of the nature of the diagnosed disorder, the treatment options and the  risks for general health and wellness arising from not treating the condition.   I spent more than 50 minutes of face to face time with the patient.  Greater  than 50% of time was spent in counseling and coordination of care. We have discussed the diagnosis and differential and I answered the patient's questions.    Plan:  Treatment plan and additional workup :  Avoid uneven surfaces to walk on- grass, sand , pebbles- etc.  Neuropathy and back pain- can make fall risk worse. Orthostatic lightheadedness - bradycardia on Toprol for a fib rate control-  Tremor is assymmtric and of a large amplitude on the right, at rest- droopy shoulder, weakness in grip strength, but no cog-wheeling.  Start treatment with primidone, if not successful trial of sinemet 25/100.   Low dose primidone at night for 14 days, than change to AM , after breakfast. RV with orthostatic BP and gait exam in December 2019.   Larey Seat, MD 2/54/2706, 2:37 PM  Certified in Neurology by ABPN Certified in [place] by [facility] [address] [place], [place] [address]

## 2018-06-19 ENCOUNTER — Ambulatory Visit (INDEPENDENT_AMBULATORY_CARE_PROVIDER_SITE_OTHER): Payer: Medicare Other | Admitting: *Deleted

## 2018-06-19 DIAGNOSIS — Z5181 Encounter for therapeutic drug level monitoring: Secondary | ICD-10-CM

## 2018-06-19 DIAGNOSIS — I4891 Unspecified atrial fibrillation: Secondary | ICD-10-CM | POA: Diagnosis not present

## 2018-06-19 DIAGNOSIS — Z7901 Long term (current) use of anticoagulants: Secondary | ICD-10-CM

## 2018-06-19 LAB — POCT INR: INR: 2.8 (ref 2.0–3.0)

## 2018-06-19 NOTE — Patient Instructions (Signed)
Description   Continue on same dosage 1/2 tablet daily except 1 tablet on Mondays, Wednesdays and Fridays.  Recheck INR in 6 weeks. Call us with any medication changes or concern #[phone] [facility], Main # [phone].       

## 2018-06-25 ENCOUNTER — Other Ambulatory Visit (HOSPITAL_COMMUNITY): Payer: Self-pay | Admitting: Cardiology

## 2018-06-25 ENCOUNTER — Other Ambulatory Visit: Payer: Self-pay | Admitting: Cardiovascular Disease

## 2018-07-29 ENCOUNTER — Other Ambulatory Visit: Payer: Self-pay | Admitting: Cardiovascular Disease

## 2018-07-31 ENCOUNTER — Ambulatory Visit (INDEPENDENT_AMBULATORY_CARE_PROVIDER_SITE_OTHER): Payer: Medicare Other | Admitting: *Deleted

## 2018-07-31 DIAGNOSIS — I4891 Unspecified atrial fibrillation: Secondary | ICD-10-CM | POA: Diagnosis not present

## 2018-07-31 DIAGNOSIS — Z7901 Long term (current) use of anticoagulants: Secondary | ICD-10-CM | POA: Diagnosis not present

## 2018-07-31 LAB — POCT INR: INR: 2.1 (ref 2.0–3.0)

## 2018-07-31 NOTE — Patient Instructions (Signed)
Description   Continue on same dosage 1/2 tablet daily except 1 tablet on Mondays, Wednesdays and Fridays.  Recheck INR in 6 weeks. Call us with any medication changes or concern #[phone] [facility], Main # [phone].       

## 2018-09-05 ENCOUNTER — Other Ambulatory Visit: Payer: Self-pay | Admitting: Cardiovascular Disease

## 2018-09-11 ENCOUNTER — Other Ambulatory Visit: Payer: Self-pay | Admitting: Cardiovascular Disease

## 2018-09-12 ENCOUNTER — Telehealth: Payer: Self-pay | Admitting: Cardiovascular Disease

## 2018-09-12 ENCOUNTER — Encounter: Payer: Self-pay | Admitting: Family Medicine

## 2018-09-12 ENCOUNTER — Ambulatory Visit (INDEPENDENT_AMBULATORY_CARE_PROVIDER_SITE_OTHER): Payer: Medicare Other

## 2018-09-12 ENCOUNTER — Ambulatory Visit (INDEPENDENT_AMBULATORY_CARE_PROVIDER_SITE_OTHER): Payer: Medicare Other | Admitting: Family Medicine

## 2018-09-12 ENCOUNTER — Other Ambulatory Visit: Payer: Self-pay

## 2018-09-12 VITALS — BP 120/64 | HR 49 | Temp 97.8°F | Ht 69.0 in | Wt 187.2 lb

## 2018-09-12 DIAGNOSIS — M545 Low back pain, unspecified: Secondary | ICD-10-CM

## 2018-09-12 DIAGNOSIS — I2510 Atherosclerotic heart disease of native coronary artery without angina pectoris: Secondary | ICD-10-CM

## 2018-09-12 DIAGNOSIS — Z23 Encounter for immunization: Secondary | ICD-10-CM | POA: Diagnosis not present

## 2018-09-12 DIAGNOSIS — S32010A Wedge compression fracture of first lumbar vertebra, initial encounter for closed fracture: Secondary | ICD-10-CM

## 2018-09-12 NOTE — Telephone Encounter (Signed)
Will have Pat review on Monday to see if OK to refill until 12/2018 or if pt needs to be seen by APP prior to f/u with Dr Angelena Form.

## 2018-09-12 NOTE — Progress Notes (Signed)
Subjective:     Patient ID: Ricardo Valencia, male   DOB: [birth year], 84 y.o.   MRN: [id number]  HPI  Patient is seen following fall this past Saturday.  Unwitnessed.  He apparently was leaning forward to get something off the ground from a seated position on a chair and went back to sit in the chair and he states the chair went out from underneath him and he landed firmly on the floor Landed on his buttock.  Has had some pain with ambulation since then mostly right lower lumbar area.  Denies any hip pain.  Patient had fall last July with compression fracture L1.  Denies any other injuries related to fall.  No head injury.  He has been doing some heat with temporary relief.  Occasionally takes Tylenol.  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    Past Medical History:  Diagnosis Date  . CAD (coronary artery disease)    DES to mid LAD 11/10/2014  . Chronic systolic heart failure ([place])   . Hay fever    hay fever, allergies  . History of echocardiogram    Echo 3/17: EF 40%, diff HK, trivial AI, trivial MR, mod LAE, mild reduced RVSF, mod RAE  . Hyperlipidemia   . Hypertension   . Persistent atrial fibrillation    Past Surgical History:  Procedure Laterality Date  . APPENDECTOMY  1947  . CARDIOVERSION N/A 12/30/2014   Procedure: CARDIOVERSION;  Surgeon: Jerline Pain, MD;  Location: [facility];  Service: Cardiovascular;  Laterality: N/A;  . CORONARY ANGIOPLASTY WITH STENT PLACEMENT    . LEFT HEART CATHETERIZATION WITH CORONARY ANGIOGRAM N/A 11/10/2014   Procedure: LEFT HEART CATHETERIZATION WITH CORONARY ANGIOGRAM;  Surgeon: Wellington Hampshire, MD;  Location: [facility] CATH LAB;  Service: Cardiovascular;  Laterality: N/A;  . PERCUTANEOUS CORONARY STENT INTERVENTION (PCI-S)  11/10/2014   Procedure: PERCUTANEOUS CORONARY STENT INTERVENTION (PCI-S);  Surgeon: Wellington Hampshire, MD;  Location: [facility] CATH LAB;  Service: Cardiovascular;;    reports that  he has never smoked. He has never used smokeless tobacco. He reports current alcohol use of about 7.0 - 14.0 standard drinks of alcohol per week. He reports that he does not use drugs. family history includes Cancer in his father and mother. No Known Allergies  Review of Systems  Cardiovascular: Negative for chest pain.  Gastrointestinal: Negative for abdominal pain.  Genitourinary: Negative for dysuria.  Musculoskeletal: Positive for back pain.  Neurological: Negative for dizziness, weakness, numbness and headaches.  Psychiatric/Behavioral: Negative for confusion.       Objective:   Physical Exam Constitutional:      Appearance: Normal appearance.  Cardiovascular:     Rate and Rhythm: Normal rate.  Pulmonary:     Effort: Pulmonary effort is normal.     Breath sounds: Normal breath sounds.  Musculoskeletal:     Comments: He has good range of motion right hip.  No lateral hip tenderness.  Right lower lumbar area is examined.  No visible bruising.  No spinal tenderness.  Neurological:     Mental Status: He is alert.        Assessment:     #1 Right low back pain following fall.  #2 hx of compression fracture of L1.    Plan:     -Given his age and prior history of compression fracture we elected to go and get lumbosacral spine films -Flu vaccine given -If x-rays negative for acute bony  injury treat symptomatically with heat and Tylenol -Avoid nonsteroidals given his age  Eulas Post MD [facility]

## 2018-09-12 NOTE — Patient Instructions (Signed)
We will call you when we get x-ray over-read.

## 2018-09-12 NOTE — Telephone Encounter (Signed)
New Message     *STAT* If patient is at the pharmacy, call can be transferred to refill team.   1. Which medications need to be refilled? (please list name of each medication and dose if known) furosemide 40mg   2. Which pharmacy/location (including street and city if local pharmacy) is medication to be sent to? [facility] on [address]  3. Do they need a 30 day or 90 day supply? 90 day supply

## 2018-09-12 NOTE — Telephone Encounter (Signed)
Patient last seen by Dr Angelena Form on 04/25/17 but has an appointment scheduled for April. If okay to refill until then please advise on how the furosemide should be ordered. Last refilled as 40 mg bid but all office visits have it listed as 40 mg qd. Looks like dose was increased at 03/28/17 office visit with Dr Angelena Form to 40 mg bid for one week only. Thanks, MI

## 2018-09-14 ENCOUNTER — Other Ambulatory Visit: Payer: Self-pay | Admitting: Cardiovascular Disease

## 2018-09-14 DIAGNOSIS — S32030A Wedge compression fracture of third lumbar vertebra, initial encounter for closed fracture: Secondary | ICD-10-CM | POA: Insufficient documentation

## 2018-09-15 ENCOUNTER — Telehealth: Payer: Self-pay | Admitting: Family Medicine

## 2018-09-15 NOTE — Telephone Encounter (Signed)
Copied from [place] [id number]. Topic: Quick Communication - See Telephone Encounter >> Sep 15, 2018 10:04 AM Robina Ade, Helene Kelp D wrote: CRM for notification. See Telephone encounter for: 09/15/18. Pt called regarding his lumbar spine results. Wants to know if Dr. [name] will give him a call back with results. Please call pt back, thanks.

## 2018-09-15 NOTE — Telephone Encounter (Signed)
Pt is followed by Dr. Aundra Dubin in [place] clinic

## 2018-09-15 NOTE — Telephone Encounter (Signed)
Please see message. Thanks!

## 2018-09-16 ENCOUNTER — Other Ambulatory Visit: Payer: Self-pay | Admitting: Cardiovascular Disease

## 2018-09-16 ENCOUNTER — Telehealth (HOSPITAL_COMMUNITY): Payer: Self-pay | Admitting: Vascular Surgery

## 2018-09-16 NOTE — Telephone Encounter (Signed)
Left pt message to make f/u appt w/ [facility] 

## 2018-09-18 ENCOUNTER — Other Ambulatory Visit: Payer: Self-pay

## 2018-09-18 DIAGNOSIS — S32010A Wedge compression fracture of first lumbar vertebra, initial encounter for closed fracture: Secondary | ICD-10-CM

## 2018-09-18 NOTE — Telephone Encounter (Signed)
Dexa order has been placed.

## 2018-09-18 NOTE — Telephone Encounter (Signed)
I have already spoken with wife.  I discussed results in detail.  Recommend set up DEXA scan (can get at [place] location).  Dx= compression fracture of lumbar spine.

## 2018-09-19 ENCOUNTER — Other Ambulatory Visit: Payer: Self-pay | Admitting: Cardiovascular Disease

## 2018-09-24 ENCOUNTER — Ambulatory Visit (INDEPENDENT_AMBULATORY_CARE_PROVIDER_SITE_OTHER): Payer: Medicare Other | Admitting: *Deleted

## 2018-09-24 DIAGNOSIS — Z7901 Long term (current) use of anticoagulants: Secondary | ICD-10-CM | POA: Diagnosis not present

## 2018-09-24 DIAGNOSIS — I4891 Unspecified atrial fibrillation: Secondary | ICD-10-CM

## 2018-09-24 LAB — POCT INR: INR: 2.6 (ref 2.0–3.0)

## 2018-09-24 NOTE — Patient Instructions (Signed)
Description   Continue on same dosage 1/2 tablet daily except 1 tablet on Mondays, Wednesdays and Fridays.  Recheck INR in 6 weeks. Call us with any medication changes or concern #[phone] [facility], Main # [phone].       

## 2018-10-20 NOTE — Progress Notes (Signed)
[facility]  PATIENT: Ricardo Valencia DOB: 1929-04-11   REASON FOR VISIT: Follow-up for tremor HISTORY FROM: Patient and wife   HISTORY OF PRESENT ILLNESS: 06/10/18 CDWerner Jenetta Downer Valencia is a 85 y.o. male patient of [place] descent whose wife has ben a patient here. He is seen on 06-10-2018 here  in a referral from Dr. Rickard Rhymes an evaluation of tremors.  Ricardo Valencia was diagnosed around the year 2012 or so with essential tremor by his then from her care physician, Dr. Stevie Kern. He reports that over the last 2 to 3 years he has noted a significant increase in amplitude, probably around March 2017.  His tremor is asymmetrical and affects the right dominant hand at rest, there is also a reduced grip strength in the right hand.  More in the left hand there is a tremor only noted when he is performing an action. He is soft spoken and reports changes in penmanship. He has a little droop in the right shoulder without cog-wheeling.   Chief complaint according to patient : tremor bothers me UPDATE 2/4/2020CM Ricardo Valencia, 85 year old male returns for follow-up with history of tremors predominantly the tremor is in his right hand but he also complains of tremor at night when he takes his dentures out.  He denies any specific weakness although he does have right shoulder droop.  He has had one fall since last seen on unlevel surfaces.  He gets no regular exercise and was encouraged to do so.  He was placed on low-dose primidone at his last visit.  His wife says it is not working he says it is helping.  He returns for reevaluation REVIEW OF SYSTEMS: Full 14 system review of systems performed and notable only for those listed, all others are neg:  Constitutional: neg  Cardiovascular: neg Ear/Nose/Throat: neg  Skin: neg Eyes: neg Respiratory: neg Gastroitestinal: neg  Hematology/Lymphatic: neg  Endocrine: neg Musculoskeletal: Walking difficulty Allergy/Immunology:  neg Neurological: Tremors, weakness Psychiatric: neg Sleep : neg   ALLERGIES: No Known Allergies  HOME MEDICATIONS: Outpatient Medications Prior to Visit  Medication Sig Dispense Refill  . acetaminophen (TYLENOL) 500 MG tablet Take 500 mg by mouth every 4 (four) hours as needed for fever (pain).    Marland Kitchen amiodarone (PACERONE) 200 MG tablet TAKE 1 TABLET BY MOUTH ONCE DAILY 30 tablet 3  . Ascorbic Acid (VITAMIN C) 1000 MG tablet Take 1,000 mg by mouth daily.    Marland Kitchen atorvastatin (LIPITOR) 80 MG tablet Take 1 tablet (80 mg total) by mouth daily at 6 PM. 90 tablet 0  . furosemide (LASIX) 40 MG tablet Take 1 tablet (40 mg total) by mouth 2 (two) times daily. Please keep upcoming appt for future refills. Thank you. 60 tablet 3  . gabapentin (NEURONTIN) 300 MG capsule Take 1 capsule (300 mg total) by mouth 2 (two) times daily. 180 capsule 1  . lisinopril (PRINIVIL,ZESTRIL) 5 MG tablet TAKE 1 TABLET BY MOUTH ONCE DAILY 90 tablet 1  . metoprolol succinate (TOPROL-XL) 50 MG 24 hr tablet Take 1 tablet (50 mg total) by mouth daily. (Patient taking differently: Take 25 mg by mouth daily. ) 30 tablet 3  . nitroGLYCERIN (NITROSTAT) 0.4 MG SL tablet Place 1 tablet (0.4 mg total) under the tongue every 5 (five) minutes x 3 doses as needed for chest pain. 25 tablet 2  . potassium chloride (K-DUR,KLOR-CON) 10 MEQ tablet TAKE 1 TABLET BY MOUTH ONCE DAILY. PLEASE MAKE YEARLY APPOINTMENT WITH DR. Angelena Form FOR [place] FOR  FUTURE REFILLS 90 tablet 1  . primidone (MYSOLINE) 50 MG tablet Start at night time with 50 mg po, and switch to daytime after 14 days. 30 tablet 5  . warfarin (COUMADIN) 5 MG tablet TAKE AS DIRECTED BY MOUTH BY COUMADIN CLINIC (Patient taking differently: 1/2 tablet sat, sun, tues and thur. And then 1 tablet mon,wed,fri) 90 tablet 1  . atorvastatin (LIPITOR) 40 MG tablet Take 40 mg by mouth daily at 6 PM.    . lisinopril (PRINIVIL,ZESTRIL) 5 MG tablet Take 2.5 mg by mouth daily.    . metoprolol  succinate (TOPROL-XL) 50 MG 24 hr tablet Take 1 tablet (50 mg total) by mouth daily. (Patient taking differently: Take 50 mg by mouth 2 (two) times daily. ) 60 tablet 3   No facility-administered medications prior to visit.     PAST MEDICAL HISTORY: Past Medical History:  Diagnosis Date  . CAD (coronary artery disease)    DES to mid LAD 11/10/2014  . Chronic systolic heart failure ([place])   . Hay fever    hay fever, allergies  . History of echocardiogram    Echo 3/17: EF 40%, diff HK, trivial AI, trivial MR, mod LAE, mild reduced RVSF, mod RAE  . Hyperlipidemia   . Hypertension   . Persistent atrial fibrillation     PAST SURGICAL HISTORY: Past Surgical History:  Procedure Laterality Date  . APPENDECTOMY  1947  . CARDIOVERSION N/A 12/30/2014   Procedure: CARDIOVERSION;  Surgeon: Jerline Pain, MD;  Location: [facility];  Service: Cardiovascular;  Laterality: N/A;  . CORONARY ANGIOPLASTY WITH STENT PLACEMENT    . LEFT HEART CATHETERIZATION WITH CORONARY ANGIOGRAM N/A 11/10/2014   Procedure: LEFT HEART CATHETERIZATION WITH CORONARY ANGIOGRAM;  Surgeon: Wellington Hampshire, MD;  Location: [facility] CATH [facility];  Service: Cardiovascular;  Laterality: N/A;  . PERCUTANEOUS CORONARY STENT INTERVENTION (PCI-S)  11/10/2014   Procedure: PERCUTANEOUS CORONARY STENT INTERVENTION (PCI-S);  Surgeon: Wellington Hampshire, MD;  Location: [facility] CATH LAB;  Service: Cardiovascular;;    FAMILY HISTORY: Family History  Problem Relation Age of Onset  . Cancer Mother        breast  . Cancer Father        colon    SOCIAL HISTORY: Social History   Socioeconomic History  . Marital status: Married    Spouse name: Not on file  . Number of children: Not on file  . Years of education: Not on file  . Highest education level: Not on file  Occupational History  . Not on file  Social Needs  . Financial resource strain: Not on file  . Food insecurity:    Worry: Not on file    Inability: Not on file  . Transportation  needs:    Medical: Not on file    Non-medical: Not on file  Tobacco Use  . Smoking status: Never Smoker  . Smokeless tobacco: Never Used  . Tobacco comment: "smoked when I was 22"  Substance and Sexual Activity  . Alcohol use: Yes    Alcohol/week: 7.0 - 14.0 standard drinks    Types: 7 - 14 Cans of beer per week  . Drug use: No  . Sexual activity: Not Currently  Lifestyle  . Physical activity:    Days per week: Not on file    Minutes per session: Not on file  . Stress: Not on file  Relationships  . Social connections:    Talks on phone: Not on file    Gets together:  Not on file    Attends religious service: Not on file    Active member of club or organization: Not on file    Attends meetings of clubs or organizations: Not on file    Relationship status: Not on file  . Intimate partner violence:    Fear of current or ex partner: Not on file    Emotionally abused: Not on file    Physically abused: Not on file    Forced sexual activity: Not on file  Other Topics Concern  . Not on file  Social History Narrative  . Not on file     PHYSICAL EXAM  Vitals:   10/21/18 1418  BP: (!) 136/56  Pulse: (!) 46  Weight: 189 lb 9.6 oz (86 kg)  Height: 5\' 9"  (1.753 m)   Body mass index is 28 kg/m.  Generalized: Well developed, in no acute distress well-groomed Head: normocephalic and atraumatic,. Oropharynx benign  Neck: Supple,   Musculoskeletal: No deformity  Skin no rash or edema Neurological examination   Mentation: Alert oriented to time, place, history taking. Attention span and concentration appropriate. Recent and remote memory intact.  Follows all commands speech and language fluent.   Cranial nerve II-XII: Pupils were equal round reactive to light extraocular movements were full, visual field were full on confrontational test. Facial sensation and strength were normal. hearing was intact to finger rubbing bilaterally. Uvula tongue midline. head turning and shoulder  shrug were normal and symmetric.Tongue protrusion into cheek strength was normal. Motor: normal bulk and tone, full strength in the BUE, BLE, except mild weak right grip mild cogwheeling at right wrist Sensory: normal and symmetric to light touch, in the upper and lower extremities Coordination: finger-nose-finger, heel-to-shin bilaterally, no dysmetria intermittent tremor resting of right hand, outstretched right hand and left hand tremor very mild, no bradykinesia Reflexes: Symmetric upper and lower, plantar responses were flexor bilaterally. Gait and Station: Rising up from seated position without assistance, stooped posture   moderate stride, good bil arm swing, smooth turning, ambulated 150 feet in 30 seconds.  No retropulsion DIAGNOSTIC DATA (LABS, IMAGING, TESTING) - I reviewed patient records, labs, notes, testing and imaging myself where available.  Lab Results  Component Value Date   WBC 6.5 10/01/2017   HGB 13.5 10/01/2017   HCT 39.9 10/01/2017   MCV 94.1 10/01/2017   PLT 148 (L) 10/01/2017      Component Value Date/Time   NA 139 12/13/2017 1349   NA 138 04/11/2017 1020   K 3.9 12/13/2017 1349   CL 105 12/13/2017 1349   CO2 27 12/13/2017 1349   GLUCOSE 115 (H) 12/13/2017 1349   BUN 14 12/13/2017 1349   BUN 16 04/11/2017 1020   CREATININE 1.82 (H) 12/13/2017 1349   CREATININE 1.21 (H) 02/15/2016 1155   CALCIUM 8.2 (L) 12/13/2017 1349   PROT 6.5 12/03/2017 1336   ALBUMIN 3.4 (L) 12/03/2017 1336   AST 23 12/03/2017 1336   ALT 14 (L) 12/03/2017 1336   ALKPHOS 69 12/03/2017 1336   BILITOT 1.0 12/03/2017 1336   GFRNONAA 32 (L) 12/13/2017 1349   GFRAA 37 (L) 12/13/2017 1349   Lab Results  Component Value Date   CHOL 178 10/01/2017   HDL 63 10/01/2017   LDLCALC 93 10/01/2017   LDLDIRECT 191.1 01/04/2011   TRIG 108 10/01/2017   CHOLHDL 2.8 10/01/2017   Lab Results  Component Value Date   HGBA1C 5.4 11/07/2014   No results found for: [id number] Lab Results  Component Value Date   TSH 8.422 (H) 12/03/2017      ASSESSMENT AND PLAN  Mr. Mena is an 86 year old gentleman with a history of atrial fibrillation on amiodarone, reduced ejection fraction, and presumed essential tremor.   Tremor more dominant in the right hand mild in the left grip strength loss in the right hand.  No decreased arm swing when walking no dysphasia no dysphonia.  No bradykinesia       PLAN: Change Primidone to 50 mg 1 hour before bed may take 1 additional dose when getting up later in the night to go to the Bathroom.  Weighted utensils for eating Avoid uneven surfaces grass, pebbles rock etc.  Walk for exercise at least 30 min daily F/U in 6 months I spent 25 minutes in total face to face time with the patient more than 50% of which was spent counseling and coordination of care, reviewing test results reviewing medications and discussing and reviewing the diagnosis of essential tremor and further treatment options. ,Given patient information on essential tremor and reviewed this with the patient and wife.  All questions were answered Dennie Bible, [facility], [facility], APRN  [facility] [address] [place], [place] [address] [phone]

## 2018-10-21 ENCOUNTER — Encounter: Payer: Self-pay | Admitting: Nurse Practitioner

## 2018-10-21 ENCOUNTER — Ambulatory Visit (INDEPENDENT_AMBULATORY_CARE_PROVIDER_SITE_OTHER): Payer: Medicare Other | Admitting: Nurse Practitioner

## 2018-10-21 VITALS — BP 136/56 | HR 46 | Ht 69.0 in | Wt 189.6 lb

## 2018-10-21 DIAGNOSIS — R259 Unspecified abnormal involuntary movements: Secondary | ICD-10-CM | POA: Diagnosis not present

## 2018-10-21 DIAGNOSIS — R251 Tremor, unspecified: Secondary | ICD-10-CM

## 2018-10-21 MED ORDER — PRIMIDONE 50 MG PO TABS: 50.0000 mg | ORAL_TABLET | Freq: Every day | ORAL | 6 refills | Status: DC

## 2018-10-21 NOTE — Patient Instructions (Addendum)
Change Primidone to 50 mg 1 hour before bed may take 1 additional dose when getting up later in the night to go to the Bathroom.  Weighted utensils for eating Avoid uneven surfaces grass, pebbles rock etc.  Walk for exercise at least 30 min daily F/U in 6 months  Essential Tremor A tremor is trembling or shaking that a person cannot control. Most tremors affect the hands or arms. Tremors can also affect the head, vocal cords, legs, and other parts of the body. Essential tremor is a tremor without a known cause. Usually, it occurs while a person is trying to perform an action. It tends to get worse gradually as a person ages. What are the causes? The cause of this condition is not known. What increases the risk? You are more likely to develop this condition if:  You have a family member with essential tremor.  You are age 83 or older.  You take certain medicines. What are the signs or symptoms? The main sign of a tremor is a rhythmic shaking of certain parts of your body that is uncontrolled and unintentional. You may:  Have difficulty eating with a spoon or fork.  Have difficulty writing.  Nod your head up and down or side to side.  Have a quivering voice. The shaking may:  Get worse over time.  Come and go.  Be more noticeable on one side of your body.  Get worse due to stress, fatigue, caffeine, and extreme heat or cold. How is this diagnosed? This condition may be diagnosed based on:  Your symptoms and medical history.  A physical exam. There is no single test to diagnose an essential tremor. However, your health care provider may order tests to rule out other causes of your condition. These may include:  Blood and urine tests.  Imaging studies of your brain, such as CT scan and MRI.  A test that measures involuntary muscle movement (electromyogram). How is this treated? Treatment for essential tremor depends on the severity of the condition.  Some tremors may  go away without treatment.  Mild tremors may not need treatment if they do not affect your day-to-day life.  Severe tremors may need to be treated using one or more of the following options: ? Medicines. ? Lifestyle changes. ? Occupational or physical therapy. Follow these instructions at home: Lifestyle   Do not use any products that contain nicotine or tobacco, such as cigarettes and e-cigarettes. If you need help quitting, ask your health care provider.  Limit your caffeine intake as told by your health care provider.  Try to get 8 hours of sleep each night.  Find ways to manage your stress that fits your lifestyle and personality. Consider trying meditation or yoga.  Try to anticipate stressful situations and allow extra time to manage them.  If you are struggling emotionally with the effects of your tremor, consider working with a mental health provider. General instructions  Take over-the-counter and prescription medicines only as told by your health care provider.  Avoid extreme heat and extreme cold.  Keep all follow-up visits as told by your health care provider. This is important. Visits may include physical therapy visits. Contact a health care provider if:  You experience any changes in the location or intensity of your tremors.  You start having a tremor after starting a new medicine.  You have tremor with other symptoms, such as: ? Numbness. ? Tingling. ? Pain. ? Weakness.  Your tremor gets worse.  Your tremor interferes with your daily life.  You feel down, blue, or sad for at least 2 weeks in a row.  Worrying about your tremor and what other people think about you interferes with your everyday life functions, including relationships, work, or school. Summary  Essential tremor is a tremor without a known cause. Usually, it occurs when you are trying to perform an action.  The cause of this condition is not known.  The main sign of a tremor is a  rhythmic shaking of certain parts of your body that is uncontrolled and unintentional.  Treatment for essential tremor depends on the severity of the condition. This information is not intended to replace advice given to you by your health care provider. Make sure you discuss any questions you have with your health care provider. Document Released: 09/24/2014 Document Revised: 09/13/2017 Document Reviewed: 09/13/2017 Elsevier Interactive Patient Education  2019 [employer].

## 2018-10-29 ENCOUNTER — Other Ambulatory Visit: Payer: Self-pay | Admitting: Family Medicine

## 2018-11-05 ENCOUNTER — Telehealth: Payer: Self-pay | Admitting: Cardiovascular Disease

## 2018-11-05 ENCOUNTER — Ambulatory Visit (INDEPENDENT_AMBULATORY_CARE_PROVIDER_SITE_OTHER): Payer: Medicare Other | Admitting: Physician Assistant

## 2018-11-05 ENCOUNTER — Encounter: Payer: Self-pay | Admitting: Physician Assistant

## 2018-11-05 ENCOUNTER — Ambulatory Visit (INDEPENDENT_AMBULATORY_CARE_PROVIDER_SITE_OTHER): Payer: Medicare Other | Admitting: Pharmacist

## 2018-11-05 VITALS — BP 140/72 | HR 48 | Ht 69.0 in | Wt 187.0 lb

## 2018-11-05 DIAGNOSIS — I4891 Unspecified atrial fibrillation: Secondary | ICD-10-CM | POA: Diagnosis not present

## 2018-11-05 DIAGNOSIS — I4819 Other persistent atrial fibrillation: Secondary | ICD-10-CM | POA: Diagnosis not present

## 2018-11-05 DIAGNOSIS — I5022 Chronic systolic (congestive) heart failure: Secondary | ICD-10-CM | POA: Diagnosis not present

## 2018-11-05 DIAGNOSIS — Z7901 Long term (current) use of anticoagulants: Secondary | ICD-10-CM

## 2018-11-05 DIAGNOSIS — I2510 Atherosclerotic heart disease of native coronary artery without angina pectoris: Secondary | ICD-10-CM | POA: Diagnosis not present

## 2018-11-05 DIAGNOSIS — E785 Hyperlipidemia, unspecified: Secondary | ICD-10-CM

## 2018-11-05 LAB — POCT INR: INR: 2 (ref 2.0–3.0)

## 2018-11-05 NOTE — Telephone Encounter (Signed)
I spoke with pt's wife. She reports pt is on the wait list for an appointment in our office and pt received a call regarding an appointment opening in our office at 1:00 today.  Pt declined appt.  [name] called back because she reports pt is not doing well and she would like him seen today.  Ermalinda Barrios, PA has an opening at 1:00 today so I scheduled pt for this appointment.

## 2018-11-05 NOTE — Telephone Encounter (Signed)
I called and s/w pt's wife who said the pt said that Dr. Camillia Herter office called to make appt for today 1 pm. I apologized that was not the message that came to me and that I will have Pat A. RN for Dr. Angelena Form call back. [name] thanked me for the call back. She did state she told the operator that Dr. Camillia Herter office called to make appt today for 1 pm.

## 2018-11-05 NOTE — Patient Instructions (Addendum)
Medication Instructions:  Your physician has recommended you make the following change in your medication:   1. Take an extra 40 mg of lasix for 1 day only  2. DECREASE: metoprolol succinate (Toprol-XL) to 25 mg once a day Lab work: TODAY: CMET, CBC, TSH  Your physician recommends that you return for a FASTING lipid profile   If you have labs (blood work) drawn today and your tests are completely normal, you will receive your results only by: Marland Kitchen MyChart Message (if you have MyChart) OR . A paper copy in the mail If you have any lab test that is abnormal or we need to change your treatment, we will call you to review the results.  Testing/Procedures: Your physician has requested that you have an echocardiogram. Echocardiography is a painless test that uses sound waves to create images of your heart. It provides your doctor with information about the size and shape of your heart and how well your heart's chambers and valves are working. This procedure takes approximately one hour. There are no restrictions for this procedure.    Follow-Up: . Keep follow up appointment with Dr. Angelena Form on 12/17/18 at 3:40 PM  Any Other Special Instructions Will Be Listed Below (If Applicable).  Two Gram Sodium Diet 2000 mg  What is Sodium? Sodium is a mineral found naturally in many foods. The most significant source of sodium in the diet is table salt, which is about 40% sodium.  Processed, convenience, and preserved foods also contain a large amount of sodium.  The body needs only 500 mg of sodium daily to function,  A normal diet provides more than enough sodium even if you do not use salt.  Why Limit Sodium? A build up of sodium in the body can cause thirst, increased blood pressure, shortness of breath, and water retention.  Decreasing sodium in the diet can reduce edema and risk of heart attack or stroke associated with high blood pressure.  Keep in mind that there are many other factors involved in  these health problems.  Heredity, obesity, lack of exercise, cigarette smoking, stress and what you eat all play a role.  General Guidelines:  Do not add salt at the table or in cooking.  One teaspoon of salt contains over 2 grams of sodium.  Read food labels  Avoid processed and convenience foods  Ask your dietitian before eating any foods not dicussed in the menu planning guidelines  Consult your physician if you wish to use a salt substitute or a sodium containing medication such as antacids.  Limit milk and milk products to 16 oz (2 cups) per day.  Shopping Hints:  READ LABELS!! "Dietetic" does not necessarily mean low sodium.  Salt and other sodium ingredients are often added to foods during processing.   Menu Planning Guidelines Food Group Choose More Often Avoid  Beverages (see also the milk group All fruit juices, low-sodium, salt-free vegetables juices, low-sodium carbonated beverages Regular vegetable or tomato juices, commercially softened water used for drinking or cooking  Breads and Cereals Enriched white, wheat, rye and pumpernickel bread, hard rolls and dinner rolls; muffins, cornbread and waffles; most dry cereals, cooked cereal without added salt; unsalted crackers and breadsticks; low sodium or homemade bread crumbs Bread, rolls and crackers with salted tops; quick breads; instant hot cereals; pancakes; commercial bread stuffing; self-rising flower and biscuit mixes; regular bread crumbs or cracker crumbs  Desserts and Sweets Desserts and sweets mad with mild should be within allowance Instant pudding mixes  and cake mixes  Fats Butter or margarine; vegetable oils; unsalted salad dressings, regular salad dressings limited to 1 Tbs; light, sour and heavy cream Regular salad dressings containing bacon fat, bacon bits, and salt pork; snack dips made with instant soup mixes or processed cheese; salted nuts  Fruits Most fresh, frozen and canned fruits Fruits processed with  salt or sodium-containing ingredient (some dried fruits are processed with sodium sulfites        Vegetables Fresh, frozen vegetables and low- sodium canned vegetables Regular canned vegetables, sauerkraut, pickled vegetables, and others prepared in brine; frozen vegetables in sauces; vegetables seasoned with ham, bacon or salt pork  Condiments, Sauces, Miscellaneous  Salt substitute with physician's approval; pepper, herbs, spices; vinegar, lemon or lime juice; hot pepper sauce; garlic powder, onion powder, low sodium soy sauce (1 Tbs.); low sodium condiments (ketchup, chili sauce, mustard) in limited amounts (1 tsp.) fresh ground horseradish; unsalted tortilla chips, pretzels, potato chips, popcorn, salsa (1/4 cup) Any seasoning made with salt including garlic salt, celery salt, onion salt, and seasoned salt; sea salt, rock salt, kosher salt; meat tenderizers; monosodium glutamate; mustard, regular soy sauce, barbecue, sauce, chili sauce, teriyaki sauce, steak sauce, Worcestershire sauce, and most flavored vinegars; canned gravy and mixes; regular condiments; salted snack foods, olives, picles, relish, horseradish sauce, catsup   Food preparation: Try these seasonings Meats:    Pork Sage, onion Serve with applesauce  Chicken Poultry seasoning, thyme, parsley Serve with cranberry sauce  Lamb Curry powder, rosemary, garlic, thyme Serve with mint sauce or jelly  Veal Marjoram, basil Serve with current jelly, cranberry sauce  Beef Pepper, bay leaf Serve with dry mustard, unsalted chive butter  Fish [place] leaf, dill Serve with unsalted lemon butter, unsalted parsley butter  Vegetables:    Asparagus Lemon juice   Broccoli Lemon juice   Carrots Mustard dressing parsley, mint, nutmeg, glazed with unsalted butter and sugar   Green beans Marjoram, lemon juice, nutmeg,dill seed   Tomatoes Basil, marjoram, onion   Spice /blend for [employer]" 4 tsp ground thyme 1 tsp ground sage 3 tsp ground  rosemary 4 tsp ground marjoram   Test your knowledge 1. A product that says "Salt Free" may still contain sodium. True or False 2. Garlic Powder and Hot Pepper Sauce an be used as alternative seasonings.True or False 3. Processed foods have more sodium than fresh foods.  True or False 4. Canned Vegetables have less sodium than froze True or False  WAYS TO DECREASE YOUR SODIUM INTAKE 1. Avoid the use of added salt in cooking and at the table.  Table salt (and other prepared seasonings which contain salt) is probably one of the greatest sources of sodium in the diet.  Unsalted foods can gain flavor from the sweet, sour, and butter taste sensations of herbs and spices.  Instead of using salt for seasoning, try the following seasonings with the foods listed.  Remember: how you use them to enhance natural food flavors is limited only by your creativity... Allspice-Meat, fish, eggs, fruit, peas, red and yellow vegetables Almond Extract-Fruit baked goods Anise Seed-Sweet breads, fruit, carrots, beets, cottage cheese, cookies (tastes like licorice) Basil-Meat, fish, eggs, vegetables, rice, vegetables salads, soups, sauces Bay Leaf-Meat, fish, stews, poultry Burnet-Salad, vegetables (cucumber-like flavor) Caraway Seed-Bread, cookies, cottage cheese, meat, vegetables, cheese, rice Cardamon-Baked goods, fruit, soups Celery Powder or seed-Salads, salad dressings, sauces, meatloaf, soup, bread.Do not use  celery salt Chervil-Meats, salads, fish, eggs, vegetables, cottage cheese (parsley-like flavor) Chili Power-Meatloaf, chicken cheese,  corn, eggplant, egg dishes Chives-Salads cottage cheese, egg dishes, soups, vegetables, sauces Cilantro-Salsa, casseroles Cinnamon-Baked goods, fruit, pork, lamb, chicken, carrots Cloves-Fruit, baked goods, fish, pot roast, green beans, beets, carrots Coriander-Pastry, cookies, meat, salads, cheese (lemon-orange flavor) Cumin-Meatloaf, fish,cheese, eggs, cabbage,fruit  pie (caraway flavor) [employer], fruit, eggs, fish, poultry, cottage cheese, vegetables Dill Seed-Meat, cottage cheese, poultry, vegetables, fish, salads, bread Fennel Seed-Bread, cookies, apples, pork, eggs, fish, beets, cabbage, cheese, Licorice-like flavor Garlic-(buds or powder) Salads, meat, poultry, fish, bread, butter, vegetables, potatoes.Do not  use garlic salt Ginger-Fruit, vegetables, baked goods, meat, fish, poultry Horseradish Root-Meet, vegetables, butter Lemon Juice or Extract-Vegetables, fruit, tea, baked goods, fish salads Mace-Baked goods fruit, vegetables, fish, poultry (taste like nutmeg) Maple Extract-Syrups Marjoram-Meat, chicken, fish, vegetables, breads, green salads (taste like Sage) Mint-Tea, lamb, sherbet, vegetables, desserts, carrots, cabbage Mustard, Dry or Seed-Cheese, eggs, meats, vegetables, poultry Nutmeg-Baked goods, fruit, chicken, eggs, vegetables, desserts Onion Powder-Meat, fish, poultry, vegetables, cheese, eggs, bread, rice salads (Do not use   Onion salt) Orange Extract-Desserts, baked goods Oregano-Pasta, eggs, cheese, onions, pork, lamb, fish, chicken, vegetables, green salads Paprika-Meat, fish, poultry, eggs, cheese, vegetables Parsley Flakes-Butter, vegetables, meat fish, poultry, eggs, bread, salads (certain forms may   Contain sodium Pepper-Meat fish, poultry, vegetables, eggs Peppermint Extract-Desserts, baked goods Poppy Seed-Eggs, bread, cheese, fruit dressings, baked goods, noodles, vegetables, cottage  [employer], poultry, meat, fish, cauliflower, turnips,eggs bread Saffron-Rice, bread, veal, chicken, fish, eggs Sage-Meat, fish, poultry, onions, eggplant, tomateos, pork, stews Savory-Eggs, salads, poultry, meat, rice, vegetables, soups, pork Tarragon-Meat, poultry, fish, eggs, butter, vegetables (licorice-like flavor)  Thyme-Meat, poultry, fish, eggs, vegetables, (clover-like flavor),  sauces, soups Tumeric-Salads, butter, eggs, fish, rice, vegetables (saffron-like flavor) Vanilla Extract-Baked goods, candy Vinegar-Salads, vegetables, meat marinades Walnut Extract-baked goods, candy  2. Choose your Foods Wisely   The following is a list of foods to avoid which are high in sodium:  Meats-Avoid all smoked, canned, salt cured, dried and kosher meat and fish as well as Anchovies   [employer] meats:Bologna, Liverwurst, Pastrami Canned meat or fish  Marinated herring Caviar    Pepperoni Corned Beef   Pizza Dried chipped beef  Salami Frozen breaded fish or meat Salt pork Frankfurters or hot dogs  Sardines Gefilte fish   Sausage Ham (boiled ham, Proscuitto Smoked butt    spiced ham)   Spam      TV Dinners Vegetables Canned vegetables (Regular) Relish Canned mushrooms  Sauerkraut Olives    Tomato juice Pickles  Bakery and Dessert Products Canned puddings  Cream pies Cheesecake   Decorated cakes Cookies  Beverages/Juices Tomato juice, regular  Gatorade   V-8 vegetable juice, regular  Breads and Cereals Biscuit mixes   Salted potato chips, corn chips, pretzels Bread stuffing mixes  Salted crackers and rolls Pancake and waffle mixes Self-rising flour  Seasonings Accent    Meat sauces Barbecue sauce  Meat tenderizer Catsup    Monosodium glutamate (MSG) Celery salt   Onion salt Chili sauce   Prepared mustard Garlic salt   Salt, seasoned salt, sea salt Gravy mixes   Soy sauce Horseradish   Steak sauce Ketchup   Tartar sauce Lite salt    Teriyaki sauce Marinade mixes   Worcestershire sauce  Others Baking powder   Cocoa and cocoa mixes Baking soda   Commercial casserole mixes Candy-caramels, chocolate  Dehydrated soups    Bars, fudge,nougats  Instant rice and pasta mixes Canned broth or soup  Maraschino cherries Cheese, aged and processed  cheese and cheese spreads  Learning Assessment Quiz  Indicated T (for True) or F (for False) for each  of the following statements:  1. _____ Fresh fruits and vegetables and unprocessed grains are generally low in sodium 2. _____ Water may contain a considerable amount of sodium, depending on the source 3. _____ You can always tell if a food is high in sodium by tasting it 4. _____ Certain laxatives my be high in sodium and should be avoided unless prescribed   by a physician or pharmacist 5. _____ Salt substitutes may be used freely by anyone on a sodium restricted diet 6. _____ Sodium is present in table salt, food additives and as a natural component of   most foods 7. _____ Table salt is approximately 90% sodium 8. _____ Limiting sodium intake may help prevent excess fluid accumulation in the body 9. _____ On a sodium-restricted diet, seasonings such as bouillon soy sauce, and    cooking wine should be used in place of table salt 10. _____ On an ingredient list, a product which lists monosodium glutamate as the first   ingredient is an appropriate food to include on a low sodium diet  Circle the best answer(s) to the following statements (Hint: there may be more than one correct answer)  11. On a low-sodium diet, some acceptable snack items are:    A. Olives  F. Bean dip   K. Grapefruit juice    B. Salted Pretzels G. Commercial Popcorn   L. Canned peaches    C. Carrot Sticks  H. Bouillon   M. Unsalted nuts   D. [place] fries  I. Peanut butter crackers N. Salami   E. Sweet pickles J. Tomato Juice   O. Pizza  12.  Seasonings that may be used freely on a reduced - sodium diet include   A. Lemon wedges F.Monosodium glutamate K. Celery seed    B.Soysauce   G. Pepper   L. Mustard powder   C. Sea salt  H. Cooking wine  M. Onion flakes   D. Vinegar  E. Prepared horseradish N. Salsa   E. Sage   J. Worcestershire sauce  O. Chutney

## 2018-11-05 NOTE — Telephone Encounter (Signed)
We did not, this may have been an automated appt reminder.

## 2018-11-05 NOTE — Telephone Encounter (Signed)
New message:    Patient wife calling stating that some called them about a appt. I did not see how called. Please call patient. Patient states that he has a 1 pm.

## 2018-11-05 NOTE — Progress Notes (Signed)
Cardiology Office Note    Date:  11/05/2018   ID:  Ricardo Valencia, DOB [birth year], MRN [id number]  PCP:  Ricardo Post, MD  Cardiologist: No primary care provider on file. EPS: None  No chief complaint on file.   History of Present Illness:  Ricardo Valencia is a 85 y.o. male with persistent atrial fibrillation, CAD status Valencia DES to the mid LAD 2016, echo at that time LVEF 25 to 30% improved to 45 to 50% atrial fibrillation 2016 cardioverted back to normal sinus rhythm.  Patient had recurrent atrial fibrillation and was on Tikosyn but stopped because of prolonged QT interval and now is on amiodarone. Last echo 06/2017 EF 55 to 60% with basal inferior akinesis mildly dilated RV and mildly decreased RV systolic function.  Patient was last seen by Dr. Aundra Valencia in the heart failure clinic 11/2017 and was doing well.  No aspirin given stable CAD and Coumadin use.  He suspected OSA but the patient did not want to do a sleep study.  Patient comes in today accompanied by his wife.  He has had some increased leg edema off and on and his wife says he is eating extra salt.  He is not very active but denies chest Valencia, shortness of breath dizziness or presyncope.  Heart rates been slow in the 40s and 50s for about 6 months.  Past Medical History:  Diagnosis Date  . CAD (coronary artery disease)    DES to mid LAD 11/10/2014  . Chronic systolic heart failure ([place])   . Hay fever    hay fever, allergies  . History of echocardiogram    Echo 3/17: EF 40%, diff HK, trivial AI, trivial MR, mod LAE, mild reduced RVSF, mod RAE  . Hyperlipidemia   . Hypertension   . Persistent atrial fibrillation     Past Surgical History:  Procedure Laterality Date  . APPENDECTOMY  1947  . CARDIOVERSION N/A 12/30/2014   Procedure: CARDIOVERSION;  Surgeon: Ricardo Pain, MD;  Location: [facility];  Service: Cardiovascular;  Laterality: N/A;  . CORONARY ANGIOPLASTY WITH STENT PLACEMENT    . LEFT HEART  CATHETERIZATION WITH CORONARY ANGIOGRAM N/A 11/10/2014   Procedure: LEFT HEART CATHETERIZATION WITH CORONARY ANGIOGRAM;  Surgeon: Ricardo Hampshire, MD;  Location: [facility] CATH LAB;  Service: Cardiovascular;  Laterality: N/A;  . PERCUTANEOUS CORONARY STENT INTERVENTION (PCI-S)  11/10/2014   Procedure: PERCUTANEOUS CORONARY STENT INTERVENTION (PCI-S);  Surgeon: Ricardo Hampshire, MD;  Location: [facility] CATH LAB;  Service: Cardiovascular;;    Current Medications: Current Meds  Medication Sig  . amiodarone (PACERONE) 200 MG tablet TAKE 1 TABLET BY MOUTH ONCE DAILY  . Ascorbic Acid (VITAMIN C) 1000 MG tablet Take 1,000 mg by mouth daily.  Marland Kitchen atorvastatin (LIPITOR) 80 MG tablet Take 1 tablet (80 mg total) by mouth daily at 6 PM.  . furosemide (LASIX) 40 MG tablet Take 1 tablet (40 mg total) by mouth 2 (two) times daily. Please keep upcoming appt for future refills. Thank you.  . gabapentin (NEURONTIN) 300 MG capsule TAKE 1 CAPSULE BY MOUTH TWICE DAILY  . lisinopril (PRINIVIL,ZESTRIL) 2.5 MG tablet Take 2.5 mg by mouth daily.  . metoprolol succinate (TOPROL-XL) 50 MG 24 hr tablet Take 25 mg by mouth daily. Take with or immediately following a meal.   . nitroGLYCERIN (NITROSTAT) 0.4 MG SL tablet Place 1 tablet (0.4 mg total) under the tongue every 5 (five) minutes x 3 doses as needed for chest Valencia.  Marland Kitchen  potassium chloride (K-DUR,KLOR-CON) 10 MEQ tablet TAKE 1 TABLET BY MOUTH ONCE DAILY. PLEASE MAKE YEARLY APPOINTMENT WITH DR. Angelena Valencia FOR AUGUST FOR FUTURE REFILLS  . primidone (MYSOLINE) 50 MG tablet Take 1 tablet (50 mg total) by mouth at bedtime. 1 tab 1 hour before bed, may repeat once during the night when up to the bathroom  . warfarin (COUMADIN) 5 MG tablet TAKE AS DIRECTED BY MOUTH BY COUMADIN CLINIC (Patient taking differently: 1/2 tablet sat, sun, tues and thur. And then 1 tablet mon,wed,fri)     Allergies:   Patient has no known allergies.   Social History   Socioeconomic History  . Marital status:  Married    Spouse name: Not on file  . Number of children: Not on file  . Years of education: Not on file  . Highest education level: Not on file  Occupational History  . Not on file  Social Needs  . Financial resource strain: Not on file  . Food insecurity:    Worry: Not on file    Inability: Not on file  . Transportation needs:    Medical: Not on file    Non-medical: Not on file  Tobacco Use  . Smoking status: Never Smoker  . Smokeless tobacco: Never Used  . Tobacco comment: "smoked when I was 22"  Substance and Sexual Activity  . Alcohol use: Yes    Alcohol/week: 7.0 - 14.0 standard drinks    Types: 7 - 14 Cans of beer per week  . Drug use: No  . Sexual activity: Not Currently  Lifestyle  . Physical activity:    Days per week: Not on file    Minutes per session: Not on file  . Stress: Not on file  Relationships  . Social connections:    Talks on phone: Not on file    Gets together: Not on file    Attends religious service: Not on file    Active member of club or organization: Not on file    Attends meetings of clubs or organizations: Not on file    Relationship status: Not on file  Other Topics Concern  . Not on file  Social History Narrative  . Not on file     Family History:  The patient's family history includes Cancer in his father and mother.   ROS:   Please see the history of present illness.    Review of Systems  Constitution: Positive for malaise/fatigue.  HENT: Negative.   Cardiovascular: Positive for leg swelling.  Respiratory: Negative.   Endocrine: Negative.   Hematologic/Lymphatic: Negative.   Musculoskeletal: Negative.   Gastrointestinal: Negative.   Genitourinary: Negative.   Neurological: Negative.    All other systems reviewed and are negative.   PHYSICAL EXAM:   VS:  BP 140/72   Pulse (!) 48   Ht 5\' 9"  (1.753 m)   Wt 187 lb (84.8 kg)   SpO2 97%   BMI 27.62 kg/m   Physical Exam  GEN: Well nourished, well developed, in no acute  distress  Neck: no JVD, carotid bruits, or masses Cardiac: Irregular, distant heart sounds; no murmurs, rubs, or gallops  Respiratory:  clear to auscultation bilaterally, normal work of breathing GI: soft, nontender, nondistended, + BS Ext: +1-2 ankle edema bilaterally without cyanosis, clubbing,Good distal pulses bilaterally Neuro:  Alert and Oriented x 3 Psych: euthymic mood, full affect  Wt Readings from Last 3 Encounters:  11/05/18 187 lb (84.8 kg)  10/21/18 189 lb 9.6 oz (86 kg)  09/12/18 187 lb 3.2 oz (84.9 kg)      Studies/Labs Reviewed:   EKG:  EKG is  ordered today.  The ekg ordered today demonstrates sinus bradycardia with first-degree AV block at 48 bpm  Recent Labs: 12/03/2017: ALT 14; TSH 8.422 12/13/2017: BUN 14; Creatinine, Ser 1.82; Potassium 3.9; Sodium 139   Lipid Panel    Component Value Date/Time   CHOL 178 10/01/2017 1317   TRIG 108 10/01/2017 1317   HDL 63 10/01/2017 1317   CHOLHDL 2.8 10/01/2017 1317   VLDL 22 10/01/2017 1317   LDLCALC 93 10/01/2017 1317   LDLDIRECT 191.1 01/04/2011 1218    Additional studies/ records that were reviewed today include:   Echo 10/11/2018Study Conclusions   - Left ventricle: The cavity size was normal. Wall thickness was   increased in a pattern of mild LVH. Indeterminant diastolic   function (atrial fibrillation). Systolic function was normal. The   estimated ejection fraction was in the range of 55% to 60%. Basal   inferior akinesis. - Aortic valve: Trileaflet; moderately calcified leaflets.   Sclerosis without stenosis. There was trivial regurgitation. - Mitral valve: Mildly calcified annulus. There was mild   regurgitation. - Left atrium: The atrium was mildly dilated. - Right ventricle: The cavity size was mildly dilated. Systolic   function was mildly reduced. - Right atrium: The atrium was mildly dilated. - Pulmonary arteries: No complete TR doppler jet so unable to   estimate PA systolic pressure. -  Inferior vena cava: The vessel was normal in size. The   respirophasic diameter changes were in the normal range (= 50%),   consistent with normal central venous pressure.   Impressions:   - The patient was in atrial fibrillation. Normal LV size with mild   LV hypertrophy. EF 55-60%. Basal inferior akinesis. Mildly   dilated RV with mildly decreased systolic function. Mild MR.   -------------------------------------------------------------------    ASSESSMENT:    1. Persistent atrial fibrillation   2. Chronic systolic heart failure ([place])   3. Hyperlipidemia, unspecified hyperlipidemia type   4. Coronary artery disease involving native coronary artery of native heart without angina pectoris      PLAN:  In order of problems listed above:  Chronic atrial fibrillation with first-degree AV block on amiodarone and Toprol.  Heart rate now in the 40s and has been slow for about 6 months.  Will decrease Toprol-XL to 25 mg once daily.  Check surveillance labs including TSH.  Keep follow-up with Dr. Angelena Valencia in April  Acute on chronic systolic CHF with ankle edema.  Patient's been getting extra salt in his diet.  Will give extra Lasix 40 mg today 2 g sodium diet.  We will repeat 2D echo to reassess LV function.  Recommend making appointment with Dr. Algernon Huxley in the future as well.  Hyperlipidemia on Lipitor.  Will need fasting lipid panel and LFTs   CAD without angina  Medication Adjustments/Labs and Tests Ordered: Current medicines are reviewed at length with the patient today.  Concerns regarding medicines are outlined above.  Medication changes, Labs and Tests ordered today are listed in the Patient Instructions below. Patient Instructions   Medication Instructions:  Your physician has recommended you make the following change in your medication:   1. Take an extra 40 mg of lasix for 1 day only  2. DECREASE: metoprolol succinate (Toprol-XL) to 25 mg once a day Lab work: TODAY:  CMET, CBC, TSH  Your physician recommends that you return for a FASTING  lipid profile   If you have labs (blood work) drawn today and your tests are completely normal, you will receive your results only by: Marland Kitchen MyChart Message (if you have MyChart) OR . A paper copy in the mail If you have any lab test that is abnormal or we need to change your treatment, we will call you to review the results.  Testing/Procedures: Your physician has requested that you have an echocardiogram. Echocardiography is a painless test that uses sound waves to create images of your heart. It provides your doctor with information about the size and shape of your heart and how well your heart's chambers and valves are working. This procedure takes approximately one hour. There are no restrictions for this procedure.    Follow-Up: . Keep follow up appointment with Dr. Angelena Valencia on 12/17/18 at 3:40 PM  Any Other Special Instructions Will Be Listed Below (If Applicable).  Two Gram Sodium Diet 2000 mg  What is Sodium? Sodium is a mineral found naturally in many foods. The most significant source of sodium in the diet is table salt, which is about 40% sodium.  Processed, convenience, and preserved foods also contain a large amount of sodium.  The body needs only 500 mg of sodium daily to function,  A normal diet provides more than enough sodium even if you do not use salt.  Why Limit Sodium? A build up of sodium in the body can cause thirst, increased blood pressure, shortness of breath, and water retention.  Decreasing sodium in the diet can reduce edema and risk of heart attack or stroke associated with high blood pressure.  Keep in mind that there are many other factors involved in these health problems.  Heredity, obesity, lack of exercise, cigarette smoking, stress and what you eat all play a role.  General Guidelines:  Do not add salt at the table or in cooking.  One teaspoon of salt contains over 2 grams of  sodium.  Read food labels  Avoid processed and convenience foods  Ask your dietitian before eating any foods not dicussed in the menu planning guidelines  Consult your physician if you wish to use a salt substitute or a sodium containing medication such as antacids.  Limit milk and milk products to 16 oz (2 cups) per day.  Shopping Hints:  READ LABELS!! "Dietetic" does not necessarily mean low sodium.  Salt and other sodium ingredients are often added to foods during processing.   Menu Planning Guidelines Food Group Choose More Often Avoid  Beverages (see also the milk group All fruit juices, low-sodium, salt-free vegetables juices, low-sodium carbonated beverages Regular vegetable or tomato juices, commercially softened water used for drinking or cooking  Breads and Cereals Enriched white, wheat, rye and pumpernickel bread, hard rolls and dinner rolls; muffins, cornbread and waffles; most dry cereals, cooked cereal without added salt; unsalted crackers and breadsticks; low sodium or homemade bread crumbs Bread, rolls and crackers with salted tops; quick breads; instant hot cereals; pancakes; commercial bread stuffing; self-rising flower and biscuit mixes; regular bread crumbs or cracker crumbs  Desserts and Sweets Desserts and sweets mad with mild should be within allowance Instant pudding mixes and cake mixes  Fats Butter or margarine; vegetable oils; unsalted salad dressings, regular salad dressings limited to 1 Tbs; light, sour and heavy cream Regular salad dressings containing bacon fat, bacon bits, and salt pork; snack dips made with instant soup mixes or processed cheese; salted nuts  Fruits Most fresh, frozen and canned fruits Fruits  processed with salt or sodium-containing ingredient (some dried fruits are processed with sodium sulfites        Vegetables Fresh, frozen vegetables and low- sodium canned vegetables Regular canned vegetables, sauerkraut, pickled vegetables, and  others prepared in brine; frozen vegetables in sauces; vegetables seasoned with ham, bacon or salt pork  Condiments, Sauces, Miscellaneous  Salt substitute with physician's approval; pepper, herbs, spices; vinegar, lemon or lime juice; hot pepper sauce; garlic powder, onion powder, low sodium soy sauce (1 Tbs.); low sodium condiments (ketchup, chili sauce, mustard) in limited amounts (1 tsp.) fresh ground horseradish; unsalted tortilla chips, pretzels, potato chips, popcorn, salsa (1/4 cup) Any seasoning made with salt including garlic salt, celery salt, onion salt, and seasoned salt; sea salt, rock salt, kosher salt; meat tenderizers; monosodium glutamate; mustard, regular soy sauce, barbecue, sauce, chili sauce, teriyaki sauce, steak sauce, Worcestershire sauce, and most flavored vinegars; canned gravy and mixes; regular condiments; salted snack foods, olives, picles, relish, horseradish sauce, catsup   Food preparation: Try these seasonings Meats:    Pork Sage, onion Serve with applesauce  Chicken Poultry seasoning, thyme, parsley Serve with cranberry sauce  Lamb Curry powder, rosemary, garlic, thyme Serve with mint sauce or jelly  Veal Marjoram, basil Serve with current jelly, cranberry sauce  Beef Pepper, bay leaf Serve with dry mustard, unsalted chive butter  Fish [place] leaf, dill Serve with unsalted lemon butter, unsalted parsley butter  Vegetables:    Asparagus Lemon juice   Broccoli Lemon juice   Carrots Mustard dressing parsley, mint, nutmeg, glazed with unsalted butter and sugar   Green beans Marjoram, lemon juice, nutmeg,dill seed   Tomatoes Basil, marjoram, onion   Spice /blend for [employer]" 4 tsp ground thyme 1 tsp ground sage 3 tsp ground rosemary 4 tsp ground marjoram   Test your knowledge 1. A product that says "Salt Free" may still contain sodium. True or False 2. Garlic Powder and Hot Pepper Sauce an be used as alternative seasonings.True or False 3. Processed foods have  more sodium than fresh foods.  True or False 4. Canned Vegetables have less sodium than froze True or False  WAYS TO DECREASE YOUR SODIUM INTAKE 1. Avoid the use of added salt in cooking and at the table.  Table salt (and other prepared seasonings which contain salt) is probably one of the greatest sources of sodium in the diet.  Unsalted foods can gain flavor from the sweet, sour, and butter taste sensations of herbs and spices.  Instead of using salt for seasoning, try the following seasonings with the foods listed.  Remember: how you use them to enhance natural food flavors is limited only by your creativity... Allspice-Meat, fish, eggs, fruit, peas, red and yellow vegetables Almond Extract-Fruit baked goods Anise Seed-Sweet breads, fruit, carrots, beets, cottage cheese, cookies (tastes like licorice) Basil-Meat, fish, eggs, vegetables, rice, vegetables salads, soups, sauces Bay Leaf-Meat, fish, stews, poultry Burnet-Salad, vegetables (cucumber-like flavor) Caraway Seed-Bread, cookies, cottage cheese, meat, vegetables, cheese, rice Cardamon-Baked goods, fruit, soups Celery Powder or seed-Salads, salad dressings, sauces, meatloaf, soup, bread.Do not use  celery salt Chervil-Meats, salads, fish, eggs, vegetables, cottage cheese (parsley-like flavor) Chili Power-Meatloaf, chicken cheese, corn, eggplant, egg dishes Chives-Salads cottage cheese, egg dishes, soups, vegetables, sauces Cilantro-Salsa, casseroles Cinnamon-Baked goods, fruit, pork, lamb, chicken, carrots Cloves-Fruit, baked goods, fish, pot roast, green beans, beets, carrots Coriander-Pastry, cookies, meat, salads, cheese (lemon-orange flavor) Cumin-Meatloaf, fish,cheese, eggs, cabbage,fruit pie (caraway flavor) [employer], fruit, eggs, fish, poultry, cottage cheese, vegetables Dill Seed-Meat, cottage  cheese, poultry, vegetables, fish, salads, bread Fennel Seed-Bread, cookies, apples, pork, eggs, fish, beets, cabbage, cheese,  Licorice-like flavor Garlic-(buds or powder) Salads, meat, poultry, fish, bread, butter, vegetables, potatoes.Do not  use garlic salt Ginger-Fruit, vegetables, baked goods, meat, fish, poultry Horseradish Root-Meet, vegetables, butter Lemon Juice or Extract-Vegetables, fruit, tea, baked goods, fish salads Mace-Baked goods fruit, vegetables, fish, poultry (taste like nutmeg) Maple Extract-Syrups Marjoram-Meat, chicken, fish, vegetables, breads, green salads (taste like Sage) Mint-Tea, lamb, sherbet, vegetables, desserts, carrots, cabbage Mustard, Dry or Seed-Cheese, eggs, meats, vegetables, poultry Nutmeg-Baked goods, fruit, chicken, eggs, vegetables, desserts Onion Powder-Meat, fish, poultry, vegetables, cheese, eggs, bread, rice salads (Do not use   Onion salt) Orange Extract-Desserts, baked goods Oregano-Pasta, eggs, cheese, onions, pork, lamb, fish, chicken, vegetables, green salads Paprika-Meat, fish, poultry, eggs, cheese, vegetables Parsley Flakes-Butter, vegetables, meat fish, poultry, eggs, bread, salads (certain forms may   Contain sodium Pepper-Meat fish, poultry, vegetables, eggs Peppermint Extract-Desserts, baked goods Poppy Seed-Eggs, bread, cheese, fruit dressings, baked goods, noodles, vegetables, cottage  [employer], poultry, meat, fish, cauliflower, turnips,eggs bread Saffron-Rice, bread, veal, chicken, fish, eggs Sage-Meat, fish, poultry, onions, eggplant, tomateos, pork, stews Savory-Eggs, salads, poultry, meat, rice, vegetables, soups, pork Tarragon-Meat, poultry, fish, eggs, butter, vegetables (licorice-like flavor)  Thyme-Meat, poultry, fish, eggs, vegetables, (clover-like flavor), sauces, soups Tumeric-Salads, butter, eggs, fish, rice, vegetables (saffron-like flavor) Vanilla Extract-Baked goods, candy Vinegar-Salads, vegetables, meat marinades Walnut Extract-baked goods, candy  2. Choose your Foods Wisely   The  following is a list of foods to avoid which are high in sodium:  Meats-Avoid all smoked, canned, salt cured, dried and kosher meat and fish as well as Anchovies   [employer] meats:Bologna, Liverwurst, Pastrami Canned meat or fish  Marinated herring Caviar    Pepperoni Corned Beef   Pizza Dried chipped beef  Salami Frozen breaded fish or meat Salt pork Frankfurters or hot dogs  Sardines Gefilte fish   Sausage Ham (boiled ham, Proscuitto Smoked butt    spiced ham)   Spam      TV Dinners Vegetables Canned vegetables (Regular) Relish Canned mushrooms  Sauerkraut Olives    Tomato juice Pickles  Bakery and Dessert Products Canned puddings  Cream pies Cheesecake   Decorated cakes Cookies  Beverages/Juices Tomato juice, regular  Gatorade   V-8 vegetable juice, regular  Breads and Cereals Biscuit mixes   Salted potato chips, corn chips, pretzels Bread stuffing mixes  Salted crackers and rolls Pancake and waffle mixes Self-rising flour  Seasonings Accent    Meat sauces Barbecue sauce  Meat tenderizer Catsup    Monosodium glutamate (MSG) Celery salt   Onion salt Chili sauce   Prepared mustard Garlic salt   Salt, seasoned salt, sea salt Gravy mixes   Soy sauce Horseradish   Steak sauce Ketchup   Tartar sauce Lite salt    Teriyaki sauce Marinade mixes   Worcestershire sauce  Others Baking powder   Cocoa and cocoa mixes Baking soda   Commercial casserole mixes Candy-caramels, chocolate  Dehydrated soups    Bars, fudge,nougats  Instant rice and pasta mixes Canned broth or soup  Maraschino cherries Cheese, aged and processed cheese and cheese spreads  Learning Assessment Quiz  Indicated T (for True) or F (for False) for each of the following statements:  1. _____ Fresh fruits and vegetables and unprocessed grains are generally low in sodium 2. _____ Water may contain a considerable amount of sodium, depending on the source 3. _____ You can always  tell if a  food is high in sodium by tasting it 4. _____ Certain laxatives my be high in sodium and should be avoided unless prescribed   by a physician or pharmacist 5. _____ Salt substitutes may be used freely by anyone on a sodium restricted diet 6. _____ Sodium is present in table salt, food additives and as a natural component of   most foods 7. _____ Table salt is approximately 90% sodium 8. _____ Limiting sodium intake may help prevent excess fluid accumulation in the body 9. _____ On a sodium-restricted diet, seasonings such as bouillon soy sauce, and    cooking wine should be used in place of table salt 10. _____ On an ingredient list, a product which lists monosodium glutamate as the first   ingredient is an appropriate food to include on a low sodium diet  Circle the best answer(s) to the following statements (Hint: there may be more than one correct answer)  11. On a low-sodium diet, some acceptable snack items are:    A. Olives  F. Bean dip   K. Grapefruit juice    B. Salted Pretzels G. Commercial Popcorn   L. Canned peaches    C. Carrot Sticks  H. Bouillon   M. Unsalted nuts   D. [place] fries  I. Peanut butter crackers N. Salami   E. Sweet pickles J. Tomato Juice   O. Pizza  12.  Seasonings that may be used freely on a reduced - sodium diet include   A. Lemon wedges F.Monosodium glutamate K. Celery seed    B.Soysauce   G. Pepper   L. Mustard powder   C. Sea salt  H. Cooking wine  M. Onion flakes   D. Vinegar  E. Prepared horseradish N. Salsa   E. Sage   J. Worcestershire sauce  O. Chutney      Signed, Ermalinda Barrios, PA-C  11/05/2018 1:13 PM    [facility], [place], [place]  [address] Phone: [phone]; Fax: [phone]

## 2018-11-05 NOTE — Telephone Encounter (Signed)
I am not sure who called the pt. Since pt has appt today at 1:30 with [facility], not sure if they may have called the pt. I will route call to [facility].

## 2018-11-05 NOTE — Patient Instructions (Signed)
Description   Continue on same dosage 1/2 tablet daily except 1 tablet on Mondays, Wednesdays and Fridays.  Recheck INR in 6 weeks. Call us with any medication changes or concern #[phone] [facility], Main # [phone].       

## 2018-11-07 LAB — COMPREHENSIVE METABOLIC PANEL
ALT: 20 IU/L (ref 0–44)
AST: 25 IU/L (ref 0–40)
Albumin/Globulin Ratio: 1.4 (ref 1.2–2.2)
Albumin: 3.7 g/dL (ref 3.6–4.6)
Alkaline Phosphatase: 88 IU/L (ref 39–117)
BUN/Creatinine Ratio: 10 (ref 10–24)
BUN: 18 mg/dL (ref 8–27)
Bilirubin Total: 0.5 mg/dL (ref 0.0–1.2)
CALCIUM: 8.1 mg/dL — AB (ref 8.6–10.2)
CO2: 28 mmol/L (ref 20–29)
Chloride: 97 mmol/L (ref 96–106)
Creatinine, Ser: 1.8 mg/dL — ABNORMAL HIGH (ref 0.76–1.27)
GFR calc Af Amer: 38 mL/min/{1.73_m2} — ABNORMAL LOW (ref >59)
GFR, EST NON AFRICAN AMERICAN: 33 mL/min/{1.73_m2} — AB (ref >59)
Globulin, Total: 2.6 g/dL (ref 1.5–4.5)
Glucose: 74 mg/dL (ref 65–99)
Potassium: 4 mmol/L (ref 3.5–5.2)
Sodium: 139 mmol/L (ref 134–144)
Total Protein: 6.3 g/dL (ref 6.0–8.5)

## 2018-11-07 LAB — CBC
HEMATOCRIT: 39.8 % (ref 37.5–51.0)
Hemoglobin: 13 g/dL (ref 13.0–17.7)
MCH: 30.1 pg (ref 26.6–33.0)
MCHC: 32.7 g/dL (ref 31.5–35.7)
MCV: 92 fL (ref 79–97)
Platelets: 157 10*3/uL (ref 150–450)
RBC: 4.32 x10E6/uL (ref 4.14–5.80)
RDW: 14.4 % (ref 11.6–15.4)
WBC: 6.5 10*3/uL (ref 3.4–10.8)

## 2018-11-07 LAB — TSH: TSH: 10.39 u[IU]/mL — ABNORMAL HIGH (ref 0.450–4.500)

## 2018-11-11 ENCOUNTER — Other Ambulatory Visit: Payer: Self-pay

## 2018-11-11 ENCOUNTER — Encounter: Payer: Self-pay | Admitting: Family Medicine

## 2018-11-11 ENCOUNTER — Ambulatory Visit (INDEPENDENT_AMBULATORY_CARE_PROVIDER_SITE_OTHER): Payer: Medicare Other | Admitting: Family Medicine

## 2018-11-11 VITALS — BP 128/74 | HR 60 | Temp 98.2°F | Ht 69.0 in | Wt 186.2 lb

## 2018-11-11 DIAGNOSIS — E039 Hypothyroidism, unspecified: Secondary | ICD-10-CM | POA: Insufficient documentation

## 2018-11-11 DIAGNOSIS — I2510 Atherosclerotic heart disease of native coronary artery without angina pectoris: Secondary | ICD-10-CM | POA: Diagnosis not present

## 2018-11-11 DIAGNOSIS — E032 Hypothyroidism due to medicaments and other exogenous substances: Secondary | ICD-10-CM | POA: Diagnosis not present

## 2018-11-11 DIAGNOSIS — B0229 Other postherpetic nervous system involvement: Secondary | ICD-10-CM

## 2018-11-11 MED ORDER — LEVOTHYROXINE SODIUM 25 MCG PO TABS: 25.0000 ug | ORAL_TABLET | Freq: Every day | ORAL | 3 refills | Status: DC

## 2018-11-11 NOTE — Patient Instructions (Signed)
Hypothyroidism  Hypothyroidism is when the thyroid gland does not make enough of certain hormones (it is underactive). The thyroid gland is a small gland located in the lower front part of the neck, just in front of the windpipe (trachea). This gland makes hormones that help control how the body uses food for energy (metabolism) as well as how the heart and brain function. These hormones also play a role in keeping your bones strong. When the thyroid is underactive, it produces too little of the hormones thyroxine (T4) and triiodothyronine (T3). What are the causes? This condition may be caused by:  Hashimoto's disease. This is a disease in which the body's disease-fighting system (immune system) attacks the thyroid gland. This is the most common cause.  Viral infections.  Pregnancy.  Certain medicines.  Birth defects.  Past radiation treatments to the head or neck for cancer.  Past treatment with radioactive iodine.  Past exposure to radiation in the environment.  Past surgical removal of part or all of the thyroid.  Problems with a gland in the center of the brain (pituitary gland).  Lack of enough iodine in the diet. What increases the risk? You are more likely to develop this condition if:  You are male.  You have a family history of thyroid conditions.  You use a medicine called lithium.  You take medicines that affect the immune system (immunosuppressants). What are the signs or symptoms? Symptoms of this condition include:  Feeling as though you have no energy (lethargy).  Not being able to tolerate cold.  Weight gain that is not explained by a change in diet or exercise habits.  Lack of appetite.  Dry skin.  Coarse hair.  Menstrual irregularity.  Slowing of thought processes.  Constipation.  Sadness or depression. How is this diagnosed? This condition may be diagnosed based on:  Your symptoms, your medical history, and a physical exam.  Blood  tests. You may also have imaging tests, such as an ultrasound or MRI. How is this treated? This condition is treated with medicine that replaces the thyroid hormones that your body does not make. After you begin treatment, it may take several weeks for symptoms to go away. Follow these instructions at home:  Take over-the-counter and prescription medicines only as told by your health care provider.  If you start taking any new medicines, tell your health care provider.  Keep all follow-up visits as told by your health care provider. This is important. ? As your condition improves, your dosage of thyroid hormone medicine may change. ? You will need to have blood tests regularly so that your health care provider can monitor your condition. Contact a health care provider if:  Your symptoms do not get better with treatment.  You are taking thyroid replacement medicine and you: ? Sweat a lot. ? Have tremors. ? Feel anxious. ? Lose weight rapidly. ? Cannot tolerate heat. ? Have emotional swings. ? Have diarrhea. ? Feel weak. Get help right away if you have:  Chest pain.  An irregular heartbeat.  A rapid heartbeat.  Difficulty breathing. Summary  Hypothyroidism is when the thyroid gland does not make enough of certain hormones (it is underactive).  When the thyroid is underactive, it produces too little of the hormones thyroxine (T4) and triiodothyronine (T3).  The most common cause is Hashimoto's disease, a disease in which the body's disease-fighting system (immune system) attacks the thyroid gland. The condition can also be caused by viral infections, medicine, pregnancy, or past   radiation treatment to the head or neck.  Symptoms may include weight gain, dry skin, constipation, feeling as though you do not have energy, and not being able to tolerate cold.  This condition is treated with medicine to replace the thyroid hormones that your body does not make. This information  is not intended to replace advice given to you by your health care provider. Make sure you discuss any questions you have with your health care provider. Document Released: 09/03/2005 Document Revised: 08/14/2017 Document Reviewed: 08/14/2017 Elsevier Interactive Patient Education  2019 Elsevier Inc.  

## 2018-11-11 NOTE — Progress Notes (Signed)
Subjective:     Patient ID: Ricardo Valencia, male   DOB: May 01, 1929, 85 y.o.   MRN: [id number]  HPI Patient seen to discuss recent abnormal thyroid functions.  He is on amiodarone for atrial fibrillation and has had gradually increasing TSH with most recent level over 10.  He has nonspecific fatigue but this is likely multifactorial.  He denies any constipation issues.  Occasional cold intolerance.  Appetite and weight are relatively stable  Other medical problems include history of hypertension, CAD, chronic systolic heart failure, atrial fibrillation  He continues to have ongoing neuropathy pain right neck and face related to shingles several years ago.  He takes gabapentin for that.  He has history of essential tremor treated with Mysoline.  He is aware some of his fatigue issues may be related to medications.  Denies any recent chest pain  Past Medical History:  Diagnosis Date  . CAD (coronary artery disease)    DES to mid LAD 11/10/2014  . Chronic systolic heart failure ([place])   . Hay fever    hay fever, allergies  . History of echocardiogram    Echo 3/17: EF 40%, diff HK, trivial AI, trivial MR, mod LAE, mild reduced RVSF, mod RAE  . Hyperlipidemia   . Hypertension   . Persistent atrial fibrillation    Past Surgical History:  Procedure Laterality Date  . APPENDECTOMY  1947  . CARDIOVERSION N/A 12/30/2014   Procedure: CARDIOVERSION;  Surgeon: Jerline Pain, MD;  Location: [facility];  Service: Cardiovascular;  Laterality: N/A;  . CORONARY ANGIOPLASTY WITH STENT PLACEMENT    . LEFT HEART CATHETERIZATION WITH CORONARY ANGIOGRAM N/A 11/10/2014   Procedure: LEFT HEART CATHETERIZATION WITH CORONARY ANGIOGRAM;  Surgeon: Wellington Hampshire, MD;  Location: [facility] CATH LAB;  Service: Cardiovascular;  Laterality: N/A;  . PERCUTANEOUS CORONARY STENT INTERVENTION (PCI-S)  11/10/2014   Procedure: PERCUTANEOUS CORONARY STENT INTERVENTION (PCI-S);  Surgeon: Wellington Hampshire, MD;  Location: [facility] CATH  LAB;  Service: Cardiovascular;;    reports that he has never smoked. He has never used smokeless tobacco. He reports current alcohol use of about 7.0 - 14.0 standard drinks of alcohol per week. He reports that he does not use drugs. family history includes Cancer in his father and mother. No Known Allergies   Review of Systems  Constitutional: Positive for fatigue. Negative for appetite change and unexpected weight change.  Respiratory: Negative for shortness of breath.   Cardiovascular: Negative for chest pain.  Gastrointestinal: Negative for constipation.  Endocrine: Positive for cold intolerance.       Objective:   Physical Exam Constitutional:      Appearance: Normal appearance.  Neck:     Musculoskeletal: Neck supple.  Cardiovascular:     Rate and Rhythm: Normal rate.  Pulmonary:     Effort: Pulmonary effort is normal.     Breath sounds: Normal breath sounds.  Neurological:     Mental Status: He is alert.        Assessment:     #1 hypothyroidism.  Likely drug-induced related to amiodarone  #2 postherpetic neuralgia.  Overall stable but has ongoing intermittent pain  #3 atrial fibrillation-rate stable.  He is on Coumadin with recent INR stable at 2.0    Plan:     -Start low-dose levothyroxine 25 mcg daily.  Would start low and gradually increase as indicated (by labs) given his age and history of CAD -Recheck TSH in about 2 months -Continue close follow-up with Coumadin clinic  Camile Esters  Dan Europe MD [facility] at [facility]

## 2018-11-12 ENCOUNTER — Other Ambulatory Visit (HOSPITAL_COMMUNITY): Payer: Medicare Other

## 2018-11-20 ENCOUNTER — Ambulatory Visit (HOSPITAL_COMMUNITY): Payer: Medicare Other | Attending: Cardiovascular Disease

## 2018-11-20 DIAGNOSIS — I5022 Chronic systolic (congestive) heart failure: Secondary | ICD-10-CM | POA: Insufficient documentation

## 2018-12-04 ENCOUNTER — Other Ambulatory Visit (HOSPITAL_COMMUNITY): Payer: Self-pay | Admitting: Cardiology

## 2018-12-06 ENCOUNTER — Other Ambulatory Visit: Payer: Self-pay | Admitting: Family Medicine

## 2018-12-08 ENCOUNTER — Emergency Department (HOSPITAL_COMMUNITY): Payer: Medicare Other

## 2018-12-08 ENCOUNTER — Encounter (HOSPITAL_COMMUNITY): Payer: Self-pay | Admitting: Emergency Medicine

## 2018-12-08 ENCOUNTER — Emergency Department (HOSPITAL_COMMUNITY)
Admission: EM | Admit: 2018-12-08 | Discharge: 2018-12-08 | Disposition: A | Discharge status: Home / Self Care | Payer: Medicare Other | Attending: Emergency Medicine | Admitting: Emergency Medicine

## 2018-12-08 ENCOUNTER — Other Ambulatory Visit: Payer: Self-pay

## 2018-12-08 DIAGNOSIS — S32030A Wedge compression fracture of third lumbar vertebra, initial encounter for closed fracture: Secondary | ICD-10-CM | POA: Insufficient documentation

## 2018-12-08 DIAGNOSIS — Y939 Activity, unspecified: Secondary | ICD-10-CM | POA: Diagnosis not present

## 2018-12-08 DIAGNOSIS — E039 Hypothyroidism, unspecified: Secondary | ICD-10-CM | POA: Insufficient documentation

## 2018-12-08 DIAGNOSIS — R296 Repeated falls: Secondary | ICD-10-CM | POA: Insufficient documentation

## 2018-12-08 DIAGNOSIS — M5489 Other dorsalgia: Secondary | ICD-10-CM | POA: Diagnosis not present

## 2018-12-08 DIAGNOSIS — Z7901 Long term (current) use of anticoagulants: Secondary | ICD-10-CM | POA: Diagnosis not present

## 2018-12-08 DIAGNOSIS — S32009A Unspecified fracture of unspecified lumbar vertebra, initial encounter for closed fracture: Secondary | ICD-10-CM

## 2018-12-08 DIAGNOSIS — W1830XA Fall on same level, unspecified, initial encounter: Secondary | ICD-10-CM | POA: Diagnosis not present

## 2018-12-08 DIAGNOSIS — I10 Essential (primary) hypertension: Secondary | ICD-10-CM | POA: Insufficient documentation

## 2018-12-08 DIAGNOSIS — S22080D Wedge compression fracture of T11-T12 vertebra, subsequent encounter for fracture with routine healing: Secondary | ICD-10-CM | POA: Diagnosis not present

## 2018-12-08 DIAGNOSIS — I4891 Unspecified atrial fibrillation: Secondary | ICD-10-CM | POA: Diagnosis not present

## 2018-12-08 DIAGNOSIS — I5022 Chronic systolic (congestive) heart failure: Secondary | ICD-10-CM | POA: Insufficient documentation

## 2018-12-08 DIAGNOSIS — M545 Low back pain: Secondary | ICD-10-CM | POA: Diagnosis not present

## 2018-12-08 DIAGNOSIS — W19XXXA Unspecified fall, initial encounter: Secondary | ICD-10-CM | POA: Diagnosis not present

## 2018-12-08 DIAGNOSIS — Y929 Unspecified place or not applicable: Secondary | ICD-10-CM | POA: Diagnosis not present

## 2018-12-08 DIAGNOSIS — I700 Atherosclerosis of aorta: Secondary | ICD-10-CM | POA: Insufficient documentation

## 2018-12-08 DIAGNOSIS — S32010D Wedge compression fracture of first lumbar vertebra, subsequent encounter for fracture with routine healing: Secondary | ICD-10-CM | POA: Insufficient documentation

## 2018-12-08 DIAGNOSIS — Y999 Unspecified external cause status: Secondary | ICD-10-CM | POA: Insufficient documentation

## 2018-12-08 DIAGNOSIS — R52 Pain, unspecified: Secondary | ICD-10-CM | POA: Diagnosis not present

## 2018-12-08 DIAGNOSIS — Z79899 Other long term (current) drug therapy: Secondary | ICD-10-CM | POA: Insufficient documentation

## 2018-12-08 LAB — COMPREHENSIVE METABOLIC PANEL
ALT: 109 U/L — ABNORMAL HIGH (ref 0–44)
AST: 68 U/L — AB (ref 15–41)
Albumin: 3.1 g/dL — ABNORMAL LOW (ref 3.5–5.0)
Alkaline Phosphatase: 80 U/L (ref 38–126)
Anion gap: 12 (ref 5–15)
BUN: 15 mg/dL (ref 8–23)
CO2: 27 mmol/L (ref 22–32)
Calcium: 8.2 mg/dL — ABNORMAL LOW (ref 8.9–10.3)
Chloride: 100 mmol/L (ref 98–111)
Creatinine, Ser: 1.5 mg/dL — ABNORMAL HIGH (ref 0.61–1.24)
GFR calc Af Amer: 48 mL/min — ABNORMAL LOW (ref >60)
GFR calc non Af Amer: 41 mL/min — ABNORMAL LOW (ref >60)
Glucose, Bld: 96 mg/dL (ref 70–99)
POTASSIUM: 3.9 mmol/L (ref 3.5–5.1)
SODIUM: 139 mmol/L (ref 135–145)
Total Bilirubin: 1.3 mg/dL — ABNORMAL HIGH (ref 0.3–1.2)
Total Protein: 6.5 g/dL (ref 6.5–8.1)

## 2018-12-08 LAB — PROTIME-INR
INR: 2.1 — ABNORMAL HIGH (ref 0.8–1.2)
Prothrombin Time: 23.2 seconds — ABNORMAL HIGH (ref 11.4–15.2)

## 2018-12-08 LAB — CBC WITH DIFFERENTIAL/PLATELET
ABS IMMATURE GRANULOCYTES: 0.05 10*3/uL (ref 0.00–0.07)
BASOS ABS: 0 10*3/uL (ref 0.0–0.1)
Basophils Relative: 1 %
Eosinophils Absolute: 0 10*3/uL (ref 0.0–0.5)
Eosinophils Relative: 1 %
HCT: 36 % — ABNORMAL LOW (ref 39.0–52.0)
Hemoglobin: 13.2 g/dL (ref 13.0–17.0)
IMMATURE GRANULOCYTES: 1 %
Lymphocytes Relative: 12 %
Lymphs Abs: 0.7 10*3/uL (ref 0.7–4.0)
MCH: 33.8 pg (ref 26.0–34.0)
MCHC: 36.7 g/dL — ABNORMAL HIGH (ref 30.0–36.0)
MCV: 92.3 fL (ref 80.0–100.0)
Monocytes Absolute: 0.6 10*3/uL (ref 0.1–1.0)
Monocytes Relative: 9 %
NEUTROS PCT: 76 %
NRBC: 0 % (ref 0.0–0.2)
Neutro Abs: 4.9 10*3/uL (ref 1.7–7.7)
Platelets: 122 10*3/uL — ABNORMAL LOW (ref 150–400)
RBC: 3.9 MIL/uL — ABNORMAL LOW (ref 4.22–5.81)
RDW: 14.6 % (ref 11.5–15.5)
WBC: 6.3 10*3/uL (ref 4.0–10.5)

## 2018-12-08 MED ORDER — ONDANSETRON HCL 4 MG/2ML IJ SOLN
4.0000 mg | Freq: Once | INTRAMUSCULAR | Status: AC
  Administered 2018-12-08: 4 mg via INTRAVENOUS
  Filled 2018-12-08: qty 2

## 2018-12-08 MED ORDER — FENTANYL CITRATE (PF) 100 MCG/2ML IJ SOLN
50.0000 ug | Freq: Once | INTRAMUSCULAR | Status: AC
  Administered 2018-12-08: 50 ug via INTRAVENOUS
  Filled 2018-12-08: qty 2

## 2018-12-08 NOTE — Progress Notes (Signed)
Orthopedic Tech Progress Note Patient Details:  Ricardo Valencia [birth year] [id number] Called in brace order Patient ID: Ricardo Valencia, male   DOB: [birth year], 85 y.o.   MRN: [id number]   Janit Pagan 12/08/2018, 3:13 PM

## 2018-12-08 NOTE — ED Notes (Signed)
Patient's wife called to notify that he is getting discharged. She states that she would pick him up

## 2018-12-08 NOTE — ED Notes (Signed)
Patient transported to CT 

## 2018-12-08 NOTE — ED Triage Notes (Signed)
Patient arrived from home via [facility] with reports of fall.  He states he has fallen 4 X since father's day 2019. He states "every time I fall on my romp it takes time to heal."  Last fall according to EMS was on Friday. Patient reports lower back pain (pain of 6)

## 2018-12-08 NOTE — ED Provider Notes (Signed)
[facility] EMERGENCY DEPARTMENT Provider Note   CSN: [id number] Arrival date & time: 12/08/18  1021    History   Chief Complaint No chief complaint on file.   HPI Ricardo Valencia is a 85 y.o. male who presents the emergency department for evaluation of back pain after fall.  The patient has had several falls starting on Father's Day of 2019.  At that time he had a mechanical fall, saw his PCP and was diagnosed with a small fracture in the lumbar spine.  Patient has had several mechanical falls since that time and states that every time he falls he gets severe low back pain again however after about a week of taking care of himself at home he is able to get back up on his feet and walk with his walker and get around.  The patient has had 2 recent falls, one was a week ago, and then 1 this past Friday, 3 days ago when he had another mechanical fall and fell onto his bottom.  He did not hit his head or lose consciousness.  States that since Friday he has been unable to get out of the bed and his wife has had to help him use the bathroom.  He states he has severe pain in his lumbar spine whenever he tries to move at all.  He has been taking Tylenol at home without any relief.  He denies any pain radiating down his legs, weakness in the lower extremities, saddle anesthesia, change in bowel or bladder control.       HPI  Past Medical History:  Diagnosis Date  . CAD (coronary artery disease)    DES to mid LAD 11/10/2014  . Chronic systolic heart failure ([place])   . Hay fever    hay fever, allergies  . History of echocardiogram    Echo 3/17: EF 40%, diff HK, trivial AI, trivial MR, mod LAE, mild reduced RVSF, mod RAE  . Hyperlipidemia   . Hypertension   . Persistent atrial fibrillation     Patient Active Problem List   Diagnosis Date Noted  . Hypothyroid 11/11/2018  . Closed compression fracture of body of L1 vertebra ([place]) 09/14/2018  . Orthostatic dizziness  06/10/2018  . Paroxysmal atrial fibrillation ([place]) 06/10/2018  . Mixed action and resting tremor 06/10/2018  . Coronary artery disease involving native coronary artery of native heart 06/10/2018  . 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 Tremor of both hands 02/25/2018  . Visit for monitoring Tikosyn therapy 07/16/2017  . Postherpetic neuralgia 03/01/2017  . NSTEMI (non-ST elevated myocardial infarction) ([place]) 06/24/2016  . Long term (current) use of anticoagulants [Z79.01] 11/29/2015  . Persistent atrial fibrillation   . Chronic systolic heart failure ([place]) 11/17/2015  . Basal cell carcinoma 10/20/2015  . Herpes zoster   . Encounter for therapeutic drug monitoring 02/15/2015  . Ischemic cardiomyopathy 11/25/2014  . CAD (coronary artery disease)   . Hypertension 01/18/2011  . Hyperlipidemia 01/18/2011    Past Surgical History:  Procedure Laterality Date  . APPENDECTOMY  1947  . CARDIOVERSION N/A 12/30/2014   Procedure: CARDIOVERSION;  Surgeon: Jerline Pain, MD;  Location: [facility];  Service: Cardiovascular;  Laterality: N/A;  . CORONARY ANGIOPLASTY WITH STENT PLACEMENT    . LEFT HEART CATHETERIZATION WITH CORONARY ANGIOGRAM N/A 11/10/2014   Procedure: LEFT HEART CATHETERIZATION WITH CORONARY ANGIOGRAM;  Surgeon: Wellington Hampshire, MD;  Location: [facility] CATH LAB;  Service: Cardiovascular;  Laterality: N/A;  . PERCUTANEOUS CORONARY  STENT INTERVENTION (PCI-S)  11/10/2014   Procedure: PERCUTANEOUS CORONARY STENT INTERVENTION (PCI-S);  Surgeon: Wellington Hampshire, MD;  Location: [facility] CATH LAB;  Service: Cardiovascular;;        Home Medications    Prior to Admission medications   Medication Sig Start Date End Date Taking? Authorizing Provider  amiodarone (PACERONE) 200 MG tablet Take 1 tablet (200 mg total) by mouth daily. NEED FOLLOW UP APPT FOR REFILLS [phone] 12/05/18   Larey Dresser, MD  Ascorbic Acid (VITAMIN C) 1000 MG tablet Take 1,000 mg by mouth daily.     [provider]  atorvastatin (LIPITOR) 80 MG tablet Take 1 tablet (80 mg total) by mouth daily at 6 PM. 09/19/18   Burnell Blanks, MD  furosemide (LASIX) 40 MG tablet Take 1 tablet (40 mg total) by mouth 2 (two) times daily. Please keep upcoming appt for future refills. Thank you. 09/15/18   Burnell Blanks, MD  gabapentin (NEURONTIN) 300 MG capsule TAKE 1 CAPSULE BY MOUTH EVERY DAY AT BEDTIME 12/08/18   Burchette, Alinda Sierras, MD  levothyroxine (SYNTHROID, LEVOTHROID) 25 MCG tablet Take 1 tablet (25 mcg total) by mouth daily before breakfast. 11/11/18   Burchette, Alinda Sierras, MD  lisinopril (PRINIVIL,ZESTRIL) 2.5 MG tablet Take 2.5 mg by mouth daily.    [provider]  metoprolol succinate (TOPROL-XL) 50 MG 24 hr tablet Take 25 mg by mouth daily. Take with or immediately following a meal.     [provider]  nitroGLYCERIN (NITROSTAT) 0.4 MG SL tablet Place 1 tablet (0.4 mg total) under the tongue every 5 (five) minutes x 3 doses as needed for chest pain. 06/25/16   Arbutus Leas, NP  potassium chloride (K-DUR,KLOR-CON) 10 MEQ tablet TAKE 1 TABLET BY MOUTH ONCE DAILY. PLEASE MAKE YEARLY APPOINTMENT WITH DR. Angelena Form FOR AUGUST FOR FUTURE REFILLS 06/10/18   Larey Dresser, MD  primidone (MYSOLINE) 50 MG tablet Take 1 tablet (50 mg total) by mouth at bedtime. 1 tab 1 hour before bed, may repeat once during the night when up to the bathroom 10/21/18   Dennie Bible, NP  warfarin (COUMADIN) 5 MG tablet TAKE AS DIRECTED BY MOUTH BY COUMADIN CLINIC Patient taking differently: 1/2 tablet sat, sun, tues and thur. And then 1 tablet mon,wed,fri 04/14/18   Burnell Blanks, MD    Family History Family History  Problem Relation Age of Onset  . Cancer Mother        breast  . Cancer Father        colon    Social History Social History   Tobacco Use  . Smoking status: Never Smoker  . Smokeless tobacco: Never Used  . Tobacco comment: "smoked when I was 22"   Substance Use Topics  . Alcohol use: Yes    Alcohol/week: 7.0 - 14.0 standard drinks    Types: 7 - 14 Cans of beer per week  . Drug use: No     Allergies   Patient has no known allergies.   Review of Systems Review of Systems  Ten systems reviewed and are negative for acute change, except as noted in the HPI.   Physical Exam Updated Vital Signs BP (!) 163/60   Pulse (!) 58   Temp 98.5 F (36.9 C) (Oral)   Resp 16   Ht 5\' 8"  (1.727 m)   Wt 84.4 kg   SpO2 98%   BMI 28.28 kg/m   Physical Exam Vitals signs and nursing note reviewed.  Constitutional:      General: He is not in acute distress.    Appearance: He is well-developed. He is not diaphoretic.  HENT:     Head: Normocephalic and atraumatic.  Eyes:     General: No scleral icterus.    Conjunctiva/sclera: Conjunctivae normal.  Neck:     Musculoskeletal: Normal range of motion and neck supple.  Cardiovascular:     Rate and Rhythm: Normal rate and regular rhythm.     Heart sounds: Normal heart sounds.  Pulmonary:     Effort: Pulmonary effort is normal. No respiratory distress.     Breath sounds: Normal breath sounds.  Abdominal:     Palpations: Abdomen is soft.     Tenderness: There is no abdominal tenderness.  Musculoskeletal:     Lumbar back: He exhibits decreased range of motion, bony tenderness and pain. He exhibits no deformity, no laceration and normal pulse.       Back:  Skin:    General: Skin is warm and dry.  Neurological:     Mental Status: He is alert and oriented to person, place, and time.     Motor: Motor function is intact.     Deep Tendon Reflexes:     Reflex Scores:      Patellar reflexes are 2+ on the right side and 2+ on the left side. Psychiatric:        Behavior: Behavior normal.      ED Treatments / Results  Labs (all labs ordered are listed, but only abnormal results are displayed) Labs Reviewed  COMPREHENSIVE METABOLIC PANEL  PROTIME-INR  CBC WITH DIFFERENTIAL/PLATELET     EKG None  Radiology No results found.  Procedures Procedures (including critical care time)  Medications Ordered in ED Medications  fentaNYL (SUBLIMAZE) injection 50 mcg (has no administration in time range)  ondansetron (ZOFRAN) injection 4 mg (has no administration in time range)     Initial Impression / Assessment and Plan / ED Course  I have reviewed the triage vital signs and the nursing notes.  Pertinent labs & imaging results that were available during my care of the patient were reviewed by me and considered in my medical decision making (see chart for details).        HE:NIDP pain after fall VS: BP (!) 144/68 (BP Location: Left Arm)   Pulse 61   Temp 98.5 F (36.9 C) (Oral)   Resp 14   Ht 5\' 8"  (1.727 m)   Wt 84.4 kg   SpO2 98%   BMI 28.28 kg/m  OE:UMPNTIR is gathered by The patient and  Nursing notes and review of EMR. DDX:. The emergent differential diagnosis for back pain includes but is not limited to fracture, muscle strain, cauda equina, spinal stenosis. DDD, ankylosing spondylitis, acute ligamentous injury, disk herniation, spondylolisthesis, Epidural compression syndrome, metastatic cancer, transverse myelitis, vertebral osteomyelitis, diskitis, kidney stone, pyelonephritis, AAA, Perforated ulcer, Retrocecal appendicitis, pancreatitis, bowel obstruction, retroperitoneal hemorrhage or mass, meningitis. Labs: I reviewed the labs which show chronic renal insufficiency, hypocalcemia, mildly elevated liver enzymes.  INR is therapeutic, mild thrombocytopenia. Imaging: I personally reviewed the images (lumbar plain film and CT lumbar spine without contrast) which show(s) the lumbar plain film shows old T12-L1 fractures.  CT lumbar spine shows new L3 vertebral body compression fracture with less than 25% height loss. EKG:N/A MDM: With traumatic back pain.  Acute L3 lumbar compression fracture.  Patient does not have any neurologic deficits like weakness hyper or  hyporeflexia.  Patient  pain is improved.  He was given TLSO splint and able to ambulate easily here with greatly improved pain. Patient disposition: Home Patient condition: Patient will be discharged in good condition.  He has pain medication at home.  Patient will follow-up with neurosurgery in the next 3+ weeks.. The patient appears reasonably screened and/or stabilized for discharge and I doubt any other medical condition or other [facility] requiring further screening, evaluation, or treatment in the ED at this time prior to discharge. I have discussed lab and/or imaging findings with the patient and answered all questions/concerns to the best of my ability. I have discussed return precautions and OP follow up.      Final Clinical Impressions(s) / ED Diagnoses   Final diagnoses:  None    ED Discharge Orders    None       Margarita Mail, PA-C 12/08/18 1704    Fredia Sorrow, MD 12/10/18 1840

## 2018-12-08 NOTE — ED Notes (Signed)
Patient transported to X-ray 

## 2018-12-08 NOTE — ED Notes (Signed)
Patient verbalizes understanding of discharge instructions. Opportunity for questioning and answers were provided. Armband removed by staff, pt discharged from ED.  

## 2018-12-08 NOTE — Discharge Instructions (Addendum)
Get help right away if: You have difficulty breathing. Your pain is very bad and it suddenly gets worse. You have numbness, tingling, or weakness in any part of your body. You are unable to empty your bladder. You cannot control your bladder or bowels. You are unable to move any body part (paralysis) that is below the level of your injury. You vomit. You have pain in your abdomen.

## 2018-12-09 ENCOUNTER — Ambulatory Visit (INDEPENDENT_AMBULATORY_CARE_PROVIDER_SITE_OTHER): Payer: Medicare Other | Admitting: Family Medicine

## 2018-12-09 ENCOUNTER — Telehealth: Payer: Self-pay | Admitting: Family Medicine

## 2018-12-09 DIAGNOSIS — R296 Repeated falls: Secondary | ICD-10-CM | POA: Diagnosis not present

## 2018-12-09 DIAGNOSIS — S32030A Wedge compression fracture of third lumbar vertebra, initial encounter for closed fracture: Secondary | ICD-10-CM | POA: Diagnosis not present

## 2018-12-09 NOTE — Telephone Encounter (Signed)
Called patient and spoke to his wife [name] and she stated that she thinks he had a severe fracture because he lost bowel control. She wants to know if she is still supposed to call the neurosurgery and spine and she is concerned about his AFIB and she is concerned about if she is supposed to call them? Also is the fracture in the same place as before? Also he is walking with a walker and he is taking Tylenol and Gabapentin for pain and he can hardly stand up and can barely move but better when he lays in bed and she is wondering what he can take for pain. He is also wearing a large back brace.  Do you want to do a telephone visit? She stated that she can do on her cell phone at [phone].

## 2018-12-09 NOTE — Telephone Encounter (Signed)
Set up phone visit- if we can't do video.  Should try video visit first.

## 2018-12-09 NOTE — Progress Notes (Signed)
Patient ID: Ricardo Valencia, male   DOB: 24-Jun-1929, 85 y.o.   MRN: [id number]  Virtual Visit via Telephone Note  I connected with Ricardo Valencia (his wife) on 12/09/18 at 2:06 PM by telephone and verified that I am speaking with the correct person using two identifiers.   I discussed the limitations, risks, security and privacy concerns of performing an evaluation and management service by telephone and the availability of in person appointments. I also discussed with the patient that there may be a patient responsible charge related to this service. The patient expressed understanding and agreed to proceed.  Location patient: home Location provider: work or home office Participants present for the call: patient, provider Patient did not have a visit in the prior 7 days to address this/these issue(s).   History of Present Illness: Patient has multiple chronic problems including history of atrial fibrillation, history of CAD, hypertension, chronic systolic heart failure, hypothyroidism, hyperlipidemia, history of postherpetic neuralgia.  We called to discuss recent falls and specifically recent ER visit which occurred yesterday.  His wife states that he had 2 recent falls including 12/04/2018 and also the next day on the 20th.  First fall occurred in the bathroom.  He apparently got tangled up in his pajamas and slipped and fell.  There was no loss of consciousness.  No reported head injury.  He noted some back pain.  He then fell the next day on the 20th when he was on the porch using his walker.  His wife was able to catch him slightly.  He had some dizziness but no loss of consciousness.  She gave him some Gatorade afterwards and is dizziness improved.  He continued to have severe back pain and was unable to get out of bed over the weekend.  He went to the ER yesterday and had both plain films and CT of the back.  He had evidence for old T12 and L1 compression fracture and new L3  compression fracture with 25% loss of height.  Patient was given fentanyl in the ER with some improvement.  They fitted him with a TLSO spine brace and wife is been giving Tylenol.  He does not have significant pain at rest but only with movement.  They are planning to set up appointment with neurosurgeon in about 3 weeks.  He has not had any recent reported urine or stool incontinence other than one episode of some slight bowel incontinence with 1 of the falls above.  Denies any perianal anesthesia.  Past Medical History:  Diagnosis Date  . CAD (coronary artery disease)    DES to mid LAD 11/10/2014  . Chronic systolic heart failure ([place])   . Hay fever    hay fever, allergies  . History of echocardiogram    Echo 3/17: EF 40%, diff HK, trivial AI, trivial MR, mod LAE, mild reduced RVSF, mod RAE  . Hyperlipidemia   . Hypertension   . Persistent atrial fibrillation    Past Surgical History:  Procedure Laterality Date  . APPENDECTOMY  1947  . CARDIOVERSION N/A 12/30/2014   Procedure: CARDIOVERSION;  Surgeon: Jerline Pain, MD;  Location: [facility];  Service: Cardiovascular;  Laterality: N/A;  . CORONARY ANGIOPLASTY WITH STENT PLACEMENT    . LEFT HEART CATHETERIZATION WITH CORONARY ANGIOGRAM N/A 11/10/2014   Procedure: LEFT HEART CATHETERIZATION WITH CORONARY ANGIOGRAM;  Surgeon: Wellington Hampshire, MD;  Location: [facility] CATH LAB;  Service: Cardiovascular;  Laterality: N/A;  . PERCUTANEOUS CORONARY STENT INTERVENTION (PCI-S)  11/10/2014   Procedure: PERCUTANEOUS CORONARY STENT INTERVENTION (PCI-S);  Surgeon: Wellington Hampshire, MD;  Location: [facility] CATH LAB;  Service: Cardiovascular;;    reports that he has never smoked. He has never used smokeless tobacco. He reports current alcohol use of about 7.0 - 14.0 standard drinks of alcohol per week. He reports that he does not use drugs. family history includes Cancer in his father and mother. No Known Allergies    Observations/Objective: Patient sounds  cheerful and well on the phone. I do not appreciate any SOB. Speech and thought processing are grossly intact. Patient reported vitals:  Assessment and Plan: #1 multiple falls.  Likely multifactorial  -Recommend constant use of walker at all times  #2 compression fracture L3 which is new.  Has had old compression fractures of T12 and L1.  He has good control of pain at rest but not with movement.  -Continue Tylenol for now.  We discussed risk and benefits of further pain medications such as opioids.  Wife would like to try to avoid at this time.  He does remain on gabapentin as well. -Proceed with neurosurgical referral in about 3 weeks  Follow Up Instructions:  -Would consider further physical therapy down the road once current Covid 19 pandemic has settled down.   [id number] [id number] [id number] 11-20 [id number] 21-30  (phone time was 2:06 PM to 2:27 PM) I did not refer this patient for an OV in the next 24 hours for this/these issue(s).  I discussed the assessment and treatment plan with the patient. The patient was provided an opportunity to ask questions and all were answered. The patient agreed with the plan and demonstrated an understanding of the instructions.   The patient was advised to call back or seek an in-person evaluation if the symptoms worsen or if the condition fails to improve as anticipated.  I provided 21 minutes of non-face-to-face time during this encounter.   Carolann Littler, MD

## 2018-12-09 NOTE — Telephone Encounter (Signed)
Copied from [place] [id number]. Topic: General - Other >> Dec 09, 2018  9:10 AM Keene Breath wrote: Reason for CRM: Patient's wife, [name], called to speak with the doctor or nurse regarding her husband.  Patient was released from the hospital yesterday and needs to see a neurologist.  Patient's wife said she has some questions for the doctor before her husband see's  the specialist.  Please advise and call patient back at - Cell:  [phone] or Home:  [phone]

## 2018-12-09 NOTE — Telephone Encounter (Signed)
Called patient and spoke to his wife [name] and she stated that she will do a telephone visit instead of the WebEx visit due to her computer not working at the moment. Patient verbalized an understanding and will be expecting a call for her appointment.

## 2018-12-10 ENCOUNTER — Other Ambulatory Visit (HOSPITAL_COMMUNITY): Payer: Self-pay | Admitting: Cardiology

## 2018-12-13 ENCOUNTER — Other Ambulatory Visit: Payer: Self-pay | Admitting: Neurology

## 2018-12-17 ENCOUNTER — Ambulatory Visit: Payer: Medicare Other | Admitting: Cardiovascular Disease

## 2018-12-23 ENCOUNTER — Telehealth: Payer: Self-pay | Admitting: Cardiovascular Disease

## 2018-12-23 NOTE — Telephone Encounter (Signed)
New Message    Pts wife is calling because pt has his coumadin appointment today.  Pts wife says the pt had blood work done in the [facility] and is wondering if it is still necessary that they come to this appointment   Please call

## 2018-12-23 NOTE — Telephone Encounter (Signed)
Spoke with pt wife who states that he had INR checked when admitted for spinal fracture. This was several weeks ago now. No medication changes since this time. INR was therapeutic at 2.1. Explained that ordinarily would want to check, but given current circumstances will push out another few weeks since INR stable. She states understanding and appt made for 5/7.

## 2018-12-31 ENCOUNTER — Other Ambulatory Visit: Payer: Self-pay | Admitting: Neurology

## 2019-01-12 ENCOUNTER — Ambulatory Visit: Payer: Medicare Other | Admitting: Family Medicine

## 2019-01-12 ENCOUNTER — Telehealth: Payer: Self-pay

## 2019-01-12 NOTE — Telephone Encounter (Signed)
Called patient and spoke to his wife and she is going to try the Doxy visit. I advised her on the instructions and wife verbalized an understanding. Appointment has been made for 01/14/19 at 1:30pm.  Copied from [place] [id number]. Topic: Appointment Scheduling - Scheduling Inquiry for Clinic >> Jan 09, 2019  5:50 PM Rutherford Nail, [place] wrote: Reason for CRM: Patient's wife calling and states that she just received the automated reminder call about patient's appointment on 01/14/2019. States that she was confused about whether it would be in office or virtual. States that she hit the button to cancel/reschedule. At this time the appointment is still on the appointment desk.

## 2019-01-14 ENCOUNTER — Ambulatory Visit (INDEPENDENT_AMBULATORY_CARE_PROVIDER_SITE_OTHER): Payer: Medicare Other | Admitting: Family Medicine

## 2019-01-14 ENCOUNTER — Ambulatory Visit: Payer: Medicare Other | Admitting: Family Medicine

## 2019-01-14 ENCOUNTER — Other Ambulatory Visit: Payer: Self-pay

## 2019-01-14 DIAGNOSIS — B0229 Other postherpetic nervous system involvement: Secondary | ICD-10-CM

## 2019-01-14 DIAGNOSIS — I2510 Atherosclerotic heart disease of native coronary artery without angina pectoris: Secondary | ICD-10-CM | POA: Diagnosis not present

## 2019-01-14 DIAGNOSIS — S32030D Wedge compression fracture of third lumbar vertebra, subsequent encounter for fracture with routine healing: Secondary | ICD-10-CM

## 2019-01-14 NOTE — Progress Notes (Signed)
This visit type was conducted due to national recommendations for restrictions regarding the COVID-19 pandemic in an effort to limit this patient's exposure and mitigate transmission in our community.   Virtual Visit via Video Note  I connected with Ricardo Valencia on 01/14/19 at  1:30 PM EDT by a video enabled telemedicine application and verified that I am speaking with the correct person using two identifiers.  Location patient: home Location provider:work or home office Persons participating in the virtual visit: patient, provider Patient does have some dementia issues and his wife assisted with the interview.  I discussed the limitations of evaluation and management by telemedicine and the availability of in person appointments. The patient expressed understanding and agreed to proceed.   HPI: We connected today to discuss the following issues  He had recent falls with compression fracture L3.  Refer to note from 12/09/2018 for details.  He had prior compression fractures of T12 and L1.  Patient is improving.  He has TLSO brace which he thinks is helping.  He is having minimal pain from the spine.  Had been taking some Tylenol.  We had previously put an order for DEXA scan but he never went.  He does not take any calcium or vitamin D.  There have been some discussion of him seeing neurosurgeon but he never went.  His major complaint is ongoing postherpetic neuralgia pain right neck and right mandible region.  He had shingles over a couple years ago.  He takes gabapentin currently.  He previously took Cymbalta which did not help.  He is not a candidate for try cyclic antidepressants because of anticholinergic risk.  He has not tried any topical Zostrix.  He has especially bad night pain.   ROS: See pertinent positives and negatives per HPI.  Past Medical History:  Diagnosis Date  . CAD (coronary artery disease)    DES to mid LAD 11/10/2014  . Chronic systolic heart failure ([place])   .  Hay fever    hay fever, allergies  . History of echocardiogram    Echo 3/17: EF 40%, diff HK, trivial AI, trivial MR, mod LAE, mild reduced RVSF, mod RAE  . Hyperlipidemia   . Hypertension   . Persistent atrial fibrillation     Past Surgical History:  Procedure Laterality Date  . APPENDECTOMY  1947  . CARDIOVERSION N/A 12/30/2014   Procedure: CARDIOVERSION;  Surgeon: Jerline Pain, MD;  Location: [facility];  Service: Cardiovascular;  Laterality: N/A;  . CORONARY ANGIOPLASTY WITH STENT PLACEMENT    . LEFT HEART CATHETERIZATION WITH CORONARY ANGIOGRAM N/A 11/10/2014   Procedure: LEFT HEART CATHETERIZATION WITH CORONARY ANGIOGRAM;  Surgeon: Wellington Hampshire, MD;  Location: [facility] CATH LAB;  Service: Cardiovascular;  Laterality: N/A;  . PERCUTANEOUS CORONARY STENT INTERVENTION (PCI-S)  11/10/2014   Procedure: PERCUTANEOUS CORONARY STENT INTERVENTION (PCI-S);  Surgeon: Wellington Hampshire, MD;  Location: [facility] CATH LAB;  Service: Cardiovascular;;    Family History  Problem Relation Age of Onset  . Cancer Mother        breast  . Cancer Father        colon    SOCIAL HX: Non-smoker.  Lives with his wife.  Patient has some dementia issues and wife is primary caregiver   Current Outpatient Medications:  .  amiodarone (PACERONE) 200 MG tablet, Take 1 tablet by mouth once daily, Disp: 90 tablet, Rfl: 3 .  Ascorbic Acid (VITAMIN C) 1000 MG tablet, Take 1,000 mg by mouth daily., Disp: ,  Rfl:  .  atorvastatin (LIPITOR) 80 MG tablet, Take 1 tablet (80 mg total) by mouth daily at 6 PM., Disp: 90 tablet, Rfl: 0 .  furosemide (LASIX) 40 MG tablet, Take 1 tablet (40 mg total) by mouth 2 (two) times daily. Please keep upcoming appt for future refills. Thank you., Disp: 60 tablet, Rfl: 3 .  gabapentin (NEURONTIN) 300 MG capsule, TAKE 1 CAPSULE BY MOUTH EVERY DAY AT BEDTIME, Disp: 90 capsule, Rfl: 0 .  levothyroxine (SYNTHROID, LEVOTHROID) 25 MCG tablet, Take 1 tablet (25 mcg total) by mouth daily before  breakfast., Disp: 90 tablet, Rfl: 3 .  lisinopril (PRINIVIL,ZESTRIL) 2.5 MG tablet, Take 2.5 mg by mouth daily., Disp: , Rfl:  .  metoprolol succinate (TOPROL-XL) 50 MG 24 hr tablet, Take 25 mg by mouth daily. Take with or immediately following a meal. , Disp: , Rfl:  .  nitroGLYCERIN (NITROSTAT) 0.4 MG SL tablet, Place 1 tablet (0.4 mg total) under the tongue every 5 (five) minutes x 3 doses as needed for chest pain., Disp: 25 tablet, Rfl: 2 .  potassium chloride (K-DUR,KLOR-CON) 10 MEQ tablet, TAKE 1 TABLET BY MOUTH ONCE DAILY. PLEASE MAKE YEARLY APPOINTMENT WITH DR. Angelena Form FOR AUGUST FOR FUTURE REFILLS, Disp: 90 tablet, Rfl: 1 .  primidone (MYSOLINE) 50 MG tablet, Take 1 tablet (50 mg total) by mouth at bedtime. 1 tab 1 hour before bed, may repeat once during the night when up to the bathroom, Disp: 60 tablet, Rfl: 6 .  warfarin (COUMADIN) 5 MG tablet, TAKE AS DIRECTED BY MOUTH BY COUMADIN CLINIC (Patient taking differently: 1/2 tablet sat, sun, tues and thur. And then 1 tablet mon,wed,fri), Disp: 90 tablet, Rfl: 1  EXAM:  VITALS per patient if applicable:  GENERAL: alert, oriented, appears well and in no acute distress  HEENT: atraumatic, conjunttiva clear, no obvious abnormalities on inspection of external nose and ears  NECK: normal movements of the head and neck  LUNGS: on inspection no signs of respiratory distress, breathing rate appears normal, no obvious gross SOB, gasping or wheezing  CV: no obvious cyanosis  MS: moves all visible extremities without noticeable abnormality  PSYCH/NEURO: pleasant and cooperative, no obvious depression or anxiety, speech and thought processing grossly intact  ASSESSMENT AND PLAN:  Discussed the following assessment and plan:  Compression fracture of L3 vertebra with routine healing, subsequent encounter - Plan: DG Bone Density  -We discussed adequate dietary sources of calcium and vitamin D - they will consider starting to leave off TLSO  brace in a couple of weeks -Set up DEXA scan  #2 postherpetic neuralgia pain right side of face.  He is already on fairly high-dose gabapentin -They will try over-the-counter Zostrix cream to use 2-3 times daily and avoid contact with eyes -Not a candidate for TCAs because of his age and other risk with anticholinergics -Previous nonresponse with Cymbalta as above   I discussed the assessment and treatment plan with the patient. The patient was provided an opportunity to ask questions and all were answered. The patient agreed with the plan and demonstrated an understanding of the instructions.   The patient was advised to call back or seek an in-person evaluation if the symptoms worsen or if the condition fails to improve as anticipated.   Carolann Littler, MD

## 2019-01-15 ENCOUNTER — Other Ambulatory Visit: Payer: Self-pay | Admitting: Cardiovascular Disease

## 2019-01-17 ENCOUNTER — Other Ambulatory Visit: Payer: Self-pay | Admitting: Family Medicine

## 2019-01-17 ENCOUNTER — Other Ambulatory Visit: Payer: Self-pay | Admitting: Cardiovascular Disease

## 2019-01-20 ENCOUNTER — Telehealth: Payer: Self-pay

## 2019-01-20 NOTE — Telephone Encounter (Signed)

## 2019-01-22 ENCOUNTER — Other Ambulatory Visit: Payer: Self-pay

## 2019-01-22 ENCOUNTER — Ambulatory Visit (INDEPENDENT_AMBULATORY_CARE_PROVIDER_SITE_OTHER): Payer: Medicare Other | Admitting: *Deleted

## 2019-01-22 DIAGNOSIS — Z7901 Long term (current) use of anticoagulants: Secondary | ICD-10-CM | POA: Diagnosis not present

## 2019-01-22 DIAGNOSIS — I4891 Unspecified atrial fibrillation: Secondary | ICD-10-CM

## 2019-01-22 LAB — POCT INR: INR: 2.1 (ref 2.0–3.0)

## 2019-02-02 ENCOUNTER — Ambulatory Visit: Payer: Self-pay

## 2019-02-02 NOTE — Telephone Encounter (Signed)
Pt fell 3 days ago and has 6-7 areas of skin tears. [name] has been using hydrogen peroxide on the areas. The diameter of the wounds range from dime to quarter sized. The wound bed is red and a darker red at the perimeter. The underlying skin is red and slightly edematous. Pt denies fever. Injury occurred during a fall where the pt was siting and tumbled over.  Care advice given and pt's wife transferred to office for appt. Wife has uploaded a picture to [facility]. Reason for Disposition . [1] Looks infected AND [2] large red area or streak (>2 inches or 5 cm)  Answer Assessment - Initial Assessment Questions 1. APPEARANCE of INJURY: "What does the injury look like?"     Right behind thumb to the elbow - 3 spots like a quarter sized red in the middle and dark red on the edges- 4 size of dime - underlying skin to the back of the forearm to the elbow is red and slightky edeatous 2. SIZE: "How large is the cut?"      See aobe 3. BLEEDING: "Is it bleeding now?" If so, ask: "Is it difficult to stop?"     no 4. LOCATION: "Where is the injury located?"      Right hand and forarm 5. ONSET: "How long ago did the injury occur?"     3 days ago  6. MECHANISM: "Tell me how it happened."      Tumbled forward and fell  7. TETANUS: "When was the last tetanus booster?"     2015 8. PREGNANCY: "Is there any chance you are pregnant?" "When was your last menstrual period?"    n/a  Protocols used: SKIN INJURY-A-AH

## 2019-02-03 ENCOUNTER — Ambulatory Visit (INDEPENDENT_AMBULATORY_CARE_PROVIDER_SITE_OTHER): Payer: Medicare Other | Admitting: Family Medicine

## 2019-02-03 ENCOUNTER — Other Ambulatory Visit: Payer: Self-pay

## 2019-02-03 DIAGNOSIS — L03113 Cellulitis of right upper limb: Secondary | ICD-10-CM

## 2019-02-03 DIAGNOSIS — Z7901 Long term (current) use of anticoagulants: Secondary | ICD-10-CM | POA: Diagnosis not present

## 2019-02-03 DIAGNOSIS — S50811A Abrasion of right forearm, initial encounter: Secondary | ICD-10-CM | POA: Diagnosis not present

## 2019-02-03 DIAGNOSIS — I2510 Atherosclerotic heart disease of native coronary artery without angina pectoris: Secondary | ICD-10-CM

## 2019-02-03 DIAGNOSIS — S0081XA Abrasion of other part of head, initial encounter: Secondary | ICD-10-CM

## 2019-02-03 MED ORDER — CEPHALEXIN 500 MG PO CAPS: 500.0000 mg | ORAL_CAPSULE | Freq: Four times a day (QID) | ORAL | 0 refills | Status: DC

## 2019-02-03 NOTE — Progress Notes (Signed)
Patient ID: Ricardo Valencia, male   DOB: 15-Feb-1929, 85 y.o.   MRN: [id number]  This visit type was conducted due to national recommendations for restrictions regarding the COVID-19 pandemic in an effort to limit this patient's exposure and mitigate transmission in our community.   Virtual Visit via Video Note  I connected with Nichollas Perusse on 02/03/19 at  2:45 PM EDT by a video enabled telemedicine application and verified that I am speaking with the correct person using two identifiers.  Location patient: home Location provider:work or home office Persons participating in the virtual visit: patient, provider  I discussed the limitations of evaluation and management by telemedicine and the availability of in person appointments. The patient expressed understanding and agreed to proceed.   HPI: Patient had a fall few days ago.  This was witnessed by his wife.  History is obtained predominantly from his wife as patient has cognitive deficits.  3 days ago he went to sit down his chair and lost his balance and fell forward.  He had abrasions dorsum right forearm as well as abrasions of the face and forehead.  She noticed increased redness of the right forearm yesterday.  She has been applying some calamine and had been using hydrogen peroxide every couple hours.  She was instructed by nurse yesterday to leave off the hydrogen peroxide.  They have not noted any warmth or fever or drainage but he has had progressive redness of the right forearm.  Tetanus 2015.  He has not had any headaches or nausea or vomiting since the injury.  He has some baseline confusion.  There was no loss of consciousness.  Patient has multiple chronic problems including history of CAD, hypertension, chronic systolic heart failure, atrial fibrillation, chronic anticoagulant use, hyperlipidemia.  He had INR on 5-7/20 which is 2.1.  No known drug allergies.    ROS: See pertinent positives and negatives per HPI.  Past  Medical History:  Diagnosis Date  . CAD (coronary artery disease)    DES to mid LAD 11/10/2014  . Chronic systolic heart failure ([place])   . Hay fever    hay fever, allergies  . History of echocardiogram    Echo 3/17: EF 40%, diff HK, trivial AI, trivial MR, mod LAE, mild reduced RVSF, mod RAE  . Hyperlipidemia   . Hypertension   . Persistent atrial fibrillation     Past Surgical History:  Procedure Laterality Date  . APPENDECTOMY  1947  . CARDIOVERSION N/A 12/30/2014   Procedure: CARDIOVERSION;  Surgeon: Jerline Pain, MD;  Location: [facility];  Service: Cardiovascular;  Laterality: N/A;  . CORONARY ANGIOPLASTY WITH STENT PLACEMENT    . LEFT HEART CATHETERIZATION WITH CORONARY ANGIOGRAM N/A 11/10/2014   Procedure: LEFT HEART CATHETERIZATION WITH CORONARY ANGIOGRAM;  Surgeon: Wellington Hampshire, MD;  Location: [facility] CATH LAB;  Service: Cardiovascular;  Laterality: N/A;  . PERCUTANEOUS CORONARY STENT INTERVENTION (PCI-S)  11/10/2014   Procedure: PERCUTANEOUS CORONARY STENT INTERVENTION (PCI-S);  Surgeon: Wellington Hampshire, MD;  Location: [facility] CATH LAB;  Service: Cardiovascular;;    Family History  Problem Relation Age of Onset  . Cancer Mother        breast  . Cancer Father        colon    SOCIAL HX: Non-smoker.  Lives with wife.   Current Outpatient Medications:  .  amiodarone (PACERONE) 200 MG tablet, Take 1 tablet by mouth once daily, Disp: 90 tablet, Rfl: 3 .  Ascorbic Acid (VITAMIN C) 1000  MG tablet, Take 1,000 mg by mouth daily., Disp: , Rfl:  .  atorvastatin (LIPITOR) 80 MG tablet, Take 1 tablet (80 mg total) by mouth daily at 6 PM., Disp: 90 tablet, Rfl: 0 .  cephALEXin (KEFLEX) 500 MG capsule, Take 1 capsule (500 mg total) by mouth 4 (four) times daily., Disp: 28 capsule, Rfl: 0 .  furosemide (LASIX) 40 MG tablet, Take 1 tablet by mouth twice daily, Disp: 180 tablet, Rfl: 0 .  gabapentin (NEURONTIN) 300 MG capsule, TAKE 1 CAPSULE BY MOUTH EVERY DAY AT BEDTIME, Disp: 90  capsule, Rfl: 0 .  levothyroxine (SYNTHROID, LEVOTHROID) 25 MCG tablet, Take 1 tablet (25 mcg total) by mouth daily before breakfast., Disp: 90 tablet, Rfl: 3 .  lisinopril (PRINIVIL,ZESTRIL) 2.5 MG tablet, Take 2.5 mg by mouth daily., Disp: , Rfl:  .  metoprolol succinate (TOPROL-XL) 50 MG 24 hr tablet, Take 25 mg by mouth daily. Take with or immediately following a meal. , Disp: , Rfl:  .  nitroGLYCERIN (NITROSTAT) 0.4 MG SL tablet, Place 1 tablet (0.4 mg total) under the tongue every 5 (five) minutes x 3 doses as needed for chest pain., Disp: 25 tablet, Rfl: 2 .  potassium chloride (K-DUR,KLOR-CON) 10 MEQ tablet, TAKE 1 TABLET BY MOUTH ONCE DAILY. PLEASE MAKE YEARLY APPOINTMENT WITH DR. Angelena Form FOR AUGUST FOR FUTURE REFILLS, Disp: 90 tablet, Rfl: 1 .  primidone (MYSOLINE) 50 MG tablet, Take 1 tablet (50 mg total) by mouth at bedtime. 1 tab 1 hour before bed, may repeat once during the night when up to the bathroom, Disp: 60 tablet, Rfl: 6 .  warfarin (COUMADIN) 5 MG tablet, TAKE AS DIRECTED BY MOUTH BY COUMADIN CLINIC (Patient taking differently: 1/2 tablet sat, sun, tues and thur. And then 1 tablet mon,wed,fri), Disp: 90 tablet, Rfl: 1  EXAM:  VITALS per patient if applicable:  GENERAL: alert, oriented, appears well and in no acute distress  HEENT: atraumatic, conjunttiva clear, no obvious abnormalities on inspection of external nose and ears  NECK: normal movements of the head and neck  LUNGS: on inspection no signs of respiratory distress, breathing rate appears normal, no obvious gross SOB, gasping or wheezing  CV: no obvious cyanosis  MS: moves all visible extremities without noticeable abnormality  PSYCH/NEURO: pleasant and cooperative, no obvious depression or anxiety, speech and thought processing grossly intact  ASSESSMENT AND PLAN:  Discussed the following assessment and plan:  #1 recent fall with multiple injuries including: -Abrasions of the right forehead and right  lateral orbit region -Abrasions right forearm -Tetanus up-to-date-2015  #2 probable cellulitis right forearm based on video encounter today -Leave off hydrogen peroxide -Clean wounds daily with soap and water -Start Keflex 500 mg 4 times daily for 7 days -Follow-up for any fever or any progressive redness or swelling  #3 chronic anticoagulation on Coumadin -Explained to wife that pro times may increase with antibiotic therapy -Suggest consideration for repeat pro time in 1 week    I discussed the assessment and treatment plan with the patient. The patient was provided an opportunity to ask questions and all were answered. The patient agreed with the plan and demonstrated an understanding of the instructions.   The patient was advised to call back or seek an in-person evaluation if the symptoms worsen or if the condition fails to improve as anticipated.   Carolann Littler, MD

## 2019-02-06 ENCOUNTER — Telehealth: Payer: Self-pay

## 2019-02-06 NOTE — Telephone Encounter (Signed)
Called spoke with pt and his wife, started on Cephalexin 500mg  QID 02/04/19 x 7 days.  Advised Cephalexin does not typically interact with Warfarin.  Pt's primary MD who rx abx requested an INR be done 1 week after start of abx.  Scheduled f/u appt for 02/12/19 per pt request.

## 2019-02-09 ENCOUNTER — Other Ambulatory Visit: Payer: Self-pay | Admitting: Cardiology

## 2019-02-09 ENCOUNTER — Other Ambulatory Visit: Payer: Self-pay | Admitting: Cardiovascular Disease

## 2019-02-09 ENCOUNTER — Other Ambulatory Visit: Payer: Self-pay | Admitting: Physician Assistant

## 2019-02-10 ENCOUNTER — Other Ambulatory Visit: Payer: Self-pay | Admitting: *Deleted

## 2019-02-11 ENCOUNTER — Telehealth: Payer: Self-pay

## 2019-02-11 NOTE — Telephone Encounter (Signed)
lmom for prescreen  

## 2019-02-12 ENCOUNTER — Ambulatory Visit: Payer: Self-pay | Admitting: *Deleted

## 2019-02-12 ENCOUNTER — Emergency Department (HOSPITAL_COMMUNITY): Payer: Medicare Other

## 2019-02-12 ENCOUNTER — Encounter (HOSPITAL_COMMUNITY): Payer: Self-pay | Admitting: Emergency Medicine

## 2019-02-12 ENCOUNTER — Other Ambulatory Visit: Payer: Self-pay

## 2019-02-12 ENCOUNTER — Emergency Department (HOSPITAL_COMMUNITY)
Admission: EM | Admit: 2019-02-12 | Discharge: 2019-02-12 | Disposition: A | Discharge status: Home / Self Care | Payer: Medicare Other | Attending: Emergency Medicine | Admitting: Emergency Medicine

## 2019-02-12 ENCOUNTER — Telehealth: Payer: Self-pay | Admitting: Pharmacist

## 2019-02-12 DIAGNOSIS — I4891 Unspecified atrial fibrillation: Secondary | ICD-10-CM | POA: Insufficient documentation

## 2019-02-12 DIAGNOSIS — R0789 Other chest pain: Secondary | ICD-10-CM | POA: Diagnosis present

## 2019-02-12 DIAGNOSIS — I2510 Atherosclerotic heart disease of native coronary artery without angina pectoris: Secondary | ICD-10-CM | POA: Diagnosis not present

## 2019-02-12 DIAGNOSIS — Y999 Unspecified external cause status: Secondary | ICD-10-CM | POA: Insufficient documentation

## 2019-02-12 DIAGNOSIS — S2231XA Fracture of one rib, right side, initial encounter for closed fracture: Secondary | ICD-10-CM | POA: Diagnosis not present

## 2019-02-12 DIAGNOSIS — I10 Essential (primary) hypertension: Secondary | ICD-10-CM | POA: Insufficient documentation

## 2019-02-12 DIAGNOSIS — Y9389 Activity, other specified: Secondary | ICD-10-CM | POA: Insufficient documentation

## 2019-02-12 DIAGNOSIS — Z7901 Long term (current) use of anticoagulants: Secondary | ICD-10-CM | POA: Insufficient documentation

## 2019-02-12 DIAGNOSIS — Z79899 Other long term (current) drug therapy: Secondary | ICD-10-CM | POA: Insufficient documentation

## 2019-02-12 DIAGNOSIS — S2232XA Fracture of one rib, left side, initial encounter for closed fracture: Secondary | ICD-10-CM | POA: Diagnosis not present

## 2019-02-12 DIAGNOSIS — W01190A Fall on same level from slipping, tripping and stumbling with subsequent striking against furniture, initial encounter: Secondary | ICD-10-CM | POA: Diagnosis not present

## 2019-02-12 DIAGNOSIS — Y92013 Bedroom of single-family (private) house as the place of occurrence of the external cause: Secondary | ICD-10-CM | POA: Insufficient documentation

## 2019-02-12 DIAGNOSIS — W19XXXA Unspecified fall, initial encounter: Secondary | ICD-10-CM

## 2019-02-12 DIAGNOSIS — E039 Hypothyroidism, unspecified: Secondary | ICD-10-CM | POA: Insufficient documentation

## 2019-02-12 MED ORDER — HYDROCODONE-ACETAMINOPHEN 5-325 MG PO TABS: 1.0000 | ORAL_TABLET | Freq: Four times a day (QID) | ORAL | 0 refills | Status: DC | PRN

## 2019-02-12 NOTE — ED Notes (Signed)
This RN called pts wife per his request, states she will be here in 15 mins.

## 2019-02-12 NOTE — ED Triage Notes (Signed)
Pt reports mechanical fall yesterday, landed on a piece of furniture injuring his left side. Reports left rib and back pain, pain upon inspiration. Denies hitting head. Pt on Coumadin.

## 2019-02-12 NOTE — Telephone Encounter (Signed)
Pt and his wife, [name] called stating that the pt fell in the bathroom on 02/11/2019 and hit the knob on the shelf; he was hit on the left side towards that under right arm, and  back/ ribs; she says that the pt initially had pain when breathing, but today is having pain when he has to cough, or takes a deep breath, moves in a certain direction; [name] says that the pt takes coumadin, and was to have his INR checked today, but based on the above information cardiology told them to go to the ED; recommendations made per nurse triage protocol, also spoke with JoAnne at [facility] and she concurs; pt's wife verbalizes understanding, and will proceed to the ED; he normally sees Dr Carolann Littler; will route to office for notification.  Reason for Disposition . [1] SEVERE PAIN in kidney area (flank) AND [2] follows direct blow to that site  Answer Assessment - Initial Assessment Questions 1. RESPIRATORY STATUS: "Describe your breathing?" (e.g., wheezing, shortness of breath, unable to speak, severe coughing)      *No Answer* 2. ONSET: "When did this breathing problem begin?"      *No Answer* 3. PATTERN "Does the difficult breathing come and go, or has it been constant since it started?"      *No Answer* 4. SEVERITY: "How bad is your breathing?" (e.g., mild, moderate, severe)    - MILD: No SOB at rest, mild SOB with walking, speaks normally in sentences, can lay down, no retractions, pulse < 100.    - MODERATE: SOB at rest, SOB with minimal exertion and prefers to sit, cannot lie down flat, speaks in phrases, mild retractions, audible wheezing, pulse 100-120.    - SEVERE: Very SOB at rest, speaks in single words, struggling to breathe, sitting hunched forward, retractions, pulse > 120      *No Answer* 5. RECURRENT SYMPTOM: "Have you had difficulty breathing before?" If so, ask: "When was the last time?" and "What happened that time?"      *No Answer* 6. CARDIAC HISTORY: "Do you have any history of heart  disease?" (e.g., heart attack, angina, bypass surgery, angioplasty)     afib 7. LUNG HISTORY: "Do you have any history of lung disease?"  (e.g., pulmonary embolus, asthma, emphysema)     *No Answer* 8. CAUSE: "What do you think is causing the breathing problem?"      *No Answer* 9. OTHER SYMPTOMS: "Do you have any other symptoms? (e.g., dizziness, runny nose, cough, chest pain, fever)     *No Answer* 10. PREGNANCY: "Is there any chance you are pregnant?" "When was your last menstrual period?"       *No Answer* 11. TRAVEL: "Have you traveled out of the country in the last month?" (e.g., travel history, exposures)       *No Answer*  Answer Assessment - Initial Assessment Questions 1. MECHANISM: "How did the injury happen?" (Consider the possibility of domestic violence or elder abuse)     Pt fell app 3 feet  2. ONSET: "When did the injury happen?" (Minutes or hours ago)    12/12/2018 around 1130;  3. LOCATION: "What part of the back is injured?"    Under Right arm and side toward the back 4. SEVERITY: "Can you move the back normally?"     yes 5. PAIN: "Is there any pain?" If so, ask: "How bad is the pain?"   (Scale 1-10; or mild, moderate, severe)     Lot of pain when he  coughs; 50% pain with movement 6. CORD SYMPTOMS: Any weakness or numbness of the arms or legs?"    no 7. SIZE: For cuts, bruises, or swelling, ask: "How large is it?" (e.g., inches or centimeters)     Bruising and swelling; 2"x1.5" 8. TETANUS: For any breaks in the skin, ask: "When was the last tetanus booster?"   no 9. OTHER SYMPTOMS: "Do you have any other symptoms?" (e.g., abdominal pain, blood in urine)    no 10. PREGNANCY: "Is there any chance you are pregnant?" "When was your last menstrual period?"      n/a  Protocols used: BACK INJURY-A-AH, BREATHING DIFFICULTY-A-AH

## 2019-02-12 NOTE — ED Notes (Signed)
Patient transported to X-ray 

## 2019-02-12 NOTE — Telephone Encounter (Signed)
FYI

## 2019-02-12 NOTE — Telephone Encounter (Signed)
Pt wife called to cancel INR check for today as pt fell yesterday and he does not want to leave home. She states he may have broken a rib, but he is afraid to go be evaluated due to his age and [place].   He was scheduled for INR for Keflex RX and I advised ok to cancel as this medication generally does not affect INR significantly, but that he should seek medical attention for his fall, especially if he was concerned for broken rib.   His wife states understanding and will keep appt as scheduled for 7/2 for now and call with additional issues. She will try to contact PCP for eval for fall yesterday.

## 2019-02-12 NOTE — ED Provider Notes (Signed)
[facility] EMERGENCY DEPARTMENT Provider Note   CSN: [id number] Arrival date & time: 02/12/19  1400    History   Chief Complaint Chief Complaint  Patient presents with  . Fall    HPI Ricardo Valencia is a 85 y.o. male.     HPI   85 year old male presents status post fall.  Patient notes yesterday he missed a step while trying to put on his pants.  He notes he fell into a dresser striking the left lateral ribs.  He notes exquisite pain at that site.  He notes he fell to the ground thereafter.  He denies any back pain, neck pain, headache or trauma to the head.  No loss of consciousness, no chest pain or abdominal pain no distal neurological complaints or other injuries.  Patient notes no shortness of breath at rest but notes pain with inspiration on the left ribs.  Patient notes that he does take Coumadin.   Past Medical History:  Diagnosis Date  . CAD (coronary artery disease)    DES to mid LAD 11/10/2014  . Chronic systolic heart failure ([place])   . Hay fever    hay fever, allergies  . History of echocardiogram    Echo 3/17: EF 40%, diff HK, trivial AI, trivial MR, mod LAE, mild reduced RVSF, mod RAE  . Hyperlipidemia   . Hypertension   . Persistent atrial fibrillation     Patient Active Problem List   Diagnosis Date Noted  . Hypothyroid 11/11/2018  . Compression fracture of L3 lumbar vertebra, closed, initial encounter ([place]) 09/14/2018  . Orthostatic dizziness 06/10/2018  . Paroxysmal atrial fibrillation ([place]) 06/10/2018  . Mixed action and resting tremor 06/10/2018  . Coronary artery disease involving native coronary artery of native heart 06/10/2018  . 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 Tremor of both hands 02/25/2018  . Visit for monitoring Tikosyn therapy 07/16/2017  . Postherpetic neuralgia 03/01/2017  . NSTEMI (non-ST elevated myocardial infarction) ([place]) 06/24/2016  . Long term (current) use of anticoagulants [Z79.01]  11/29/2015  . Persistent atrial fibrillation   . Chronic systolic heart failure ([place]) 11/17/2015  . Basal cell carcinoma 10/20/2015  . Herpes zoster   . Encounter for therapeutic drug monitoring 02/15/2015  . Ischemic cardiomyopathy 11/25/2014  . CAD (coronary artery disease)   . Hypertension 01/18/2011  . Hyperlipidemia 01/18/2011    Past Surgical History:  Procedure Laterality Date  . APPENDECTOMY  1947  . CARDIOVERSION N/A 12/30/2014   Procedure: CARDIOVERSION;  Surgeon: Jerline Pain, MD;  Location: [facility];  Service: Cardiovascular;  Laterality: N/A;  . CORONARY ANGIOPLASTY WITH STENT PLACEMENT    . LEFT HEART CATHETERIZATION WITH CORONARY ANGIOGRAM N/A 11/10/2014   Procedure: LEFT HEART CATHETERIZATION WITH CORONARY ANGIOGRAM;  Surgeon: Wellington Hampshire, MD;  Location: [facility] CATH LAB;  Service: Cardiovascular;  Laterality: N/A;  . PERCUTANEOUS CORONARY STENT INTERVENTION (PCI-S)  11/10/2014   Procedure: PERCUTANEOUS CORONARY STENT INTERVENTION (PCI-S);  Surgeon: Wellington Hampshire, MD;  Location: [facility] CATH LAB;  Service: Cardiovascular;;       Home Medications    Prior to Admission medications   Medication Sig Start Date End Date Taking? Authorizing Provider  amiodarone (PACERONE) 200 MG tablet Take 1 tablet by mouth once daily 12/11/18   Larey Dresser, MD  Ascorbic Acid (VITAMIN C) 1000 MG tablet Take 1,000 mg by mouth daily.    [provider]  atorvastatin (LIPITOR) 80 MG tablet Take 1 tablet (80 mg total) by  mouth daily at 6 PM. 01/19/19   Burnell Blanks, MD  cephALEXin (KEFLEX) 500 MG capsule Take 1 capsule (500 mg total) by mouth 4 (four) times daily. 02/03/19   Burchette, Alinda Sierras, MD  furosemide (LASIX) 40 MG tablet Take 1 tablet by mouth twice daily 01/15/19   Burnell Blanks, MD  gabapentin (NEURONTIN) 300 MG capsule TAKE 1 CAPSULE BY MOUTH EVERY DAY AT BEDTIME 01/19/19   Burchette, Alinda Sierras, MD  HYDROcodone-acetaminophen (NORCO/VICODIN) 5-325 MG  tablet Take 1 tablet by mouth every 6 (six) hours as needed. 02/12/19   Duane Trias, Dellis Filbert, PA-C  levothyroxine (SYNTHROID, LEVOTHROID) 25 MCG tablet Take 1 tablet (25 mcg total) by mouth daily before breakfast. 11/11/18   Burchette, Alinda Sierras, MD  lisinopril (PRINIVIL,ZESTRIL) 2.5 MG tablet Take 2.5 mg by mouth daily.    [provider]  metoprolol succinate (TOPROL-XL) 50 MG 24 hr tablet Take 25 mg by mouth daily. Take with or immediately following a meal.     [provider]  nitroGLYCERIN (NITROSTAT) 0.4 MG SL tablet Place 1 tablet (0.4 mg total) under the tongue every 5 (five) minutes x 3 doses as needed for chest pain. 06/25/16   Arbutus Leas, NP  potassium chloride (K-DUR) 10 MEQ tablet Take 1 tablet by mouth once daily 02/12/19   Burnell Blanks, MD  primidone (MYSOLINE) 50 MG tablet Take 1 tablet (50 mg total) by mouth at bedtime. 1 tab 1 hour before bed, may repeat once during the night when up to the bathroom 10/21/18   Dennie Bible, NP  warfarin (COUMADIN) 5 MG tablet TAKE AS DIRECTED BY MOUTH BY COUMADIN CLINIC Patient taking differently: 1/2 tablet sat, sun, tues and thur. And then 1 tablet mon,wed,fri 04/14/18   Burnell Blanks, MD    Family History Family History  Problem Relation Age of Onset  . Cancer Mother        breast  . Cancer Father        colon    Social History Social History   Tobacco Use  . Smoking status: Never Smoker  . Smokeless tobacco: Never Used  . Tobacco comment: "smoked when I was 22"  Substance Use Topics  . Alcohol use: Yes    Alcohol/week: 7.0 - 14.0 standard drinks    Types: 7 - 14 Cans of beer per week    Comment: occ  . Drug use: No     Allergies   Patient has no known allergies.   Review of Systems Review of Systems  All other systems reviewed and are negative.  Physical Exam Updated Vital Signs BP (!) 110/52 (BP Location: Right Arm)   Pulse (!) 54   Temp 98.9 F (37.2 C) (Oral)   Resp 14    SpO2 99%   Physical Exam Vitals signs and nursing note reviewed.  Constitutional:      Appearance: He is well-developed.  HENT:     Head: Normocephalic and atraumatic.  Eyes:     General: No scleral icterus.       Right eye: No discharge.        Left eye: No discharge.     Conjunctiva/sclera: Conjunctivae normal.     Pupils: Pupils are equal, round, and reactive to light.  Neck:     Musculoskeletal: Normal range of motion.     Vascular: No JVD.     Trachea: No tracheal deviation.  Cardiovascular:     Rate and Rhythm: Normal rate and regular  rhythm.  Pulmonary:     Effort: Pulmonary effort is normal.     Breath sounds: No stridor.     Comments: Minor bruising to the left lateral mid ribs, no crepitus no obvious deformity lung sounds clear bilateral, pain with inspiration-no anterior chest pain with palpation Abdominal:     Comments: Abdomen soft and nontender  Musculoskeletal:     Comments: No CT or L-spine tenderness to palpation-hips are stable and pain-free with AP and lateral compression  Neurological:     Mental Status: He is alert and oriented to person, place, and time.     Coordination: Coordination normal.  Psychiatric:        Behavior: Behavior normal.        Thought Content: Thought content normal.        Judgment: Judgment normal.     ED Treatments / Results  Labs (all labs ordered are listed, but only abnormal results are displayed) Labs Reviewed - No data to display  EKG None  Radiology Dg Ribs Unilateral W/chest Left  Result Date: 02/12/2019 CLINICAL DATA:  Left chest pain since an injury suffered in a fall today. Initial encounter. EXAM: LEFT RIBS AND CHEST - 3+ VIEW COMPARISON:  PA and lateral chest 06/23/2016. FINDINGS: Small focus of scar in the lingula is noted. Lungs otherwise clear. No pneumothorax or pleural effusion. There is cardiomegaly. Aortic atherosclerosis is noted. Minimally displaced acute fracture of the left eighth rib is identified.  No other fracture is seen. IMPRESSION: Minimally displaced left eighth rib fracture. Negative for pneumothorax or acute cardiopulmonary disease. Cardiomegaly. Atherosclerosis. Electronically Signed   By: Inge Rise M.D.   On: 02/12/2019 15:22    Procedures Procedures (including critical care time)  Medications Ordered in ED Medications - No data to display   Initial Impression / Assessment and Plan / ED Course  I have reviewed the triage vital signs and the nursing notes.  Pertinent labs & imaging results that were available during my care of the patient were reviewed by me and considered in my medical decision making (see chart for details).        Labs:   Imaging: DG ribs left  Consults:  Therapeutics:  Discharge Meds:   Assessment/Plan: 85 year old male presents status post fall.  Patient has minimally displaced left eighth rib fracture.  This does appear to be isolated as this was a corner.  I have lower suspicion for multiple rib fractures.  Is no shortness of breath, reassuring vital signs.  No abdominal pain low suspicion for any intra-abdominal injuries.  Care was discussed with attending physician.  Patient appears stable for outpatient management, I discussed with the patient using Tylenol, incentive spirometer.  I will give him a prescription for hydrocodone as needed for severe pain, I discussed the side effects of this medication and risks of taking it.  Patient understands the risks and will be extra careful with this medication.  He lives at home with his wife.  He is given strict return cautions, he verbalized understanding and agreement to today's plan had no further questions or concerns.   Final Clinical Impressions(s) / ED Diagnoses   Final diagnoses:  Fall, initial encounter  Closed fracture of one rib of left side, initial encounter    ED Discharge Orders         Ordered    HYDROcodone-acetaminophen (NORCO/VICODIN) 5-325 MG tablet  Every 6 hours  PRN     02/12/19 1600  Okey Regal, PA-C 02/12/19 [place], Nathan, MD 02/12/19 [phone]

## 2019-02-12 NOTE — Discharge Instructions (Addendum)
Please read attached information. If you experience any new or worsening signs or symptoms please return to the emergency room for evaluation. Please follow-up with your primary care provider or specialist as discussed. Please use medication prescribed only as directed and discontinue taking if you have any concerning signs or symptoms.   °

## 2019-02-13 ENCOUNTER — Encounter (HOSPITAL_COMMUNITY): Payer: Self-pay | Admitting: Physician Assistant

## 2019-02-13 NOTE — Telephone Encounter (Signed)
Agree with advice given to proceed to ER.

## 2019-02-17 ENCOUNTER — Other Ambulatory Visit: Payer: Self-pay

## 2019-02-17 ENCOUNTER — Ambulatory Visit (INDEPENDENT_AMBULATORY_CARE_PROVIDER_SITE_OTHER): Payer: Medicare Other | Admitting: Family Medicine

## 2019-02-17 DIAGNOSIS — I2510 Atherosclerotic heart disease of native coronary artery without angina pectoris: Secondary | ICD-10-CM

## 2019-02-17 DIAGNOSIS — I4819 Other persistent atrial fibrillation: Secondary | ICD-10-CM | POA: Diagnosis not present

## 2019-02-17 DIAGNOSIS — S2232XA Fracture of one rib, left side, initial encounter for closed fracture: Secondary | ICD-10-CM | POA: Insufficient documentation

## 2019-02-17 DIAGNOSIS — R259 Unspecified abnormal involuntary movements: Secondary | ICD-10-CM

## 2019-02-17 DIAGNOSIS — Z9181 History of falling: Secondary | ICD-10-CM | POA: Insufficient documentation

## 2019-02-17 DIAGNOSIS — I10 Essential (primary) hypertension: Secondary | ICD-10-CM | POA: Diagnosis not present

## 2019-02-17 DIAGNOSIS — R296 Repeated falls: Secondary | ICD-10-CM

## 2019-02-17 MED ORDER — HYDROCODONE-ACETAMINOPHEN 5-325 MG PO TABS: 1.0000 | ORAL_TABLET | Freq: Four times a day (QID) | ORAL | 0 refills | Status: DC | PRN

## 2019-02-17 NOTE — Progress Notes (Signed)
Patient ID: Ricardo Valencia, male   DOB: 06-Apr-1929, 85 y.o.   MRN: [id number]  This visit type was conducted due to national recommendations for restrictions regarding the COVID-19 pandemic in an effort to limit this patient's exposure and mitigate transmission in our community.   Virtual Visit via Video Note  I connected with Greyden Besecker on 02/17/19 at  1:45 PM EDT by a video enabled telemedicine application and verified that I am speaking with the correct person using two identifiers.  Location patient: home Location provider:work or home office Persons participating in the virtual visit: patient, provider  I discussed the limitations of evaluation and management by telemedicine and the availability of in person appointments. The patient expressed understanding and agreed to proceed.   HPI:  Patient was contacted regarding ER follow-up.  He presented there 28 May after a fall at home.  He apparently was in the bathroom changing clothes trying to put on his pants when he fell into a dresser striking his left lateral rib cage area.  There was no reported head injury or loss of consciousness.  Is on Coumadin.  He denies any neck pain, headache, or back pain.  No abdominal pain.  No neurologic complaints.  X-ray revealed slightly displaced left eighth rib fracture.  He was given hydrocodone and currently taking 1 twice daily which is helping.  He has not had any constipation issues.  He does have some fatigue and somnolence with hydrocodone.  He has pending bone density scan June 8.  He has been, in general, declining over recent months and years.  His wife is feeling somewhat overwhelmed with care.  She has no one else much to help and has difficulty getting out to do errands, etc..  He has multiple chronic problems include history of CAD, hypertension, chronic systolic heart failure, atrial fibrillation, hypothyroidism, hyperlipidemia, chronic Coumadin therapy.   ROS: See pertinent  positives and negatives per HPI.  Past Medical History:  Diagnosis Date  . CAD (coronary artery disease)    DES to mid LAD 11/10/2014  . Chronic systolic heart failure ([place])   . Hay fever    hay fever, allergies  . History of echocardiogram    Echo 3/17: EF 40%, diff HK, trivial AI, trivial MR, mod LAE, mild reduced RVSF, mod RAE  . Hyperlipidemia   . Hypertension   . Persistent atrial fibrillation     Past Surgical History:  Procedure Laterality Date  . APPENDECTOMY  1947  . CARDIOVERSION N/A 12/30/2014   Procedure: CARDIOVERSION;  Surgeon: Jerline Pain, MD;  Location: [facility];  Service: Cardiovascular;  Laterality: N/A;  . CORONARY ANGIOPLASTY WITH STENT PLACEMENT    . LEFT HEART CATHETERIZATION WITH CORONARY ANGIOGRAM N/A 11/10/2014   Procedure: LEFT HEART CATHETERIZATION WITH CORONARY ANGIOGRAM;  Surgeon: Wellington Hampshire, MD;  Location: [facility] CATH LAB;  Service: Cardiovascular;  Laterality: N/A;  . PERCUTANEOUS CORONARY STENT INTERVENTION (PCI-S)  11/10/2014   Procedure: PERCUTANEOUS CORONARY STENT INTERVENTION (PCI-S);  Surgeon: Wellington Hampshire, MD;  Location: [facility] C[facility] LAB;  Service: Cardiovascular;;    Family History  Problem Relation Age of Onset  . Cancer Mother        breast  . Cancer Father        colon    SOCIAL HX:  Non-smoker.  Lives with wife.  Retired.    Current Outpatient Medications:  .  amiodarone (PACERONE) 200 MG tablet, Take 1 tablet by mouth once daily, Disp: 90 tablet, Rfl: 3 .  Ascorbic Acid (VITAMIN C) 1000 MG tablet, Take 1,000 mg by mouth daily., Disp: , Rfl:  .  atorvastatin (LIPITOR) 80 MG tablet, Take 1 tablet (80 mg total) by mouth daily at 6 PM., Disp: 90 tablet, Rfl: 0 .  cephALEXin (KEFLEX) 500 MG capsule, Take 1 capsule (500 mg total) by mouth 4 (four) times daily., Disp: 28 capsule, Rfl: 0 .  furosemide (LASIX) 40 MG tablet, Take 1 tablet by mouth twice daily, Disp: 180 tablet, Rfl: 0 .  gabapentin (NEURONTIN) 300 MG capsule, TAKE 1  CAPSULE BY MOUTH EVERY DAY AT BEDTIME, Disp: 90 capsule, Rfl: 0 .  HYDROcodone-acetaminophen (NORCO/VICODIN) 5-325 MG tablet, Take 1 tablet by mouth every 6 (six) hours as needed., Disp: 10 tablet, Rfl: 0 .  levothyroxine (SYNTHROID, LEVOTHROID) 25 MCG tablet, Take 1 tablet (25 mcg total) by mouth daily before breakfast., Disp: 90 tablet, Rfl: 3 .  lisinopril (PRINIVIL,ZESTRIL) 2.5 MG tablet, Take 2.5 mg by mouth daily., Disp: , Rfl:  .  metoprolol succinate (TOPROL-XL) 50 MG 24 hr tablet, Take 25 mg by mouth daily. Take with or immediately following a meal. , Disp: , Rfl:  .  nitroGLYCERIN (NITROSTAT) 0.4 MG SL tablet, Place 1 tablet (0.4 mg total) under the tongue every 5 (five) minutes x 3 doses as needed for chest pain., Disp: 25 tablet, Rfl: 2 .  potassium chloride (K-DUR) 10 MEQ tablet, Take 1 tablet by mouth once daily, Disp: 90 tablet, Rfl: 2 .  primidone (MYSOLINE) 50 MG tablet, Take 1 tablet (50 mg total) by mouth at bedtime. 1 tab 1 hour before bed, may repeat once during the night when up to the bathroom, Disp: 60 tablet, Rfl: 6 .  warfarin (COUMADIN) 5 MG tablet, TAKE AS DIRECTED BY MOUTH BY COUMADIN CLINIC (Patient taking differently: 1/2 tablet sat, sun, tues and thur. And then 1 tablet mon,wed,fri), Disp: 90 tablet, Rfl: 1  EXAM:  VITALS per patient if applicable:  GENERAL: alert, oriented, appears well and in no acute distress  HEENT: atraumatic, conjunttiva clear, no obvious abnormalities on inspection of external nose and ears  NECK: normal movements of the head and neck  LUNGS: on inspection no signs of respiratory distress, breathing rate appears normal, no obvious gross SOB, gasping or wheezing  CV: no obvious cyanosis  MS: moves all visible extremities without noticeable abnormality  PSYCH/NEURO: pleasant and cooperative, no obvious depression or anxiety, speech and thought processing grossly intact  ASSESSMENT AND PLAN:  Discussed the following assessment and  plan:  #1 recent fall with mildly displaced left eighth rib fracture.  Pain fairly well controlled with hydrocodone  -They have only 3 hydrocodone tablets left and will refill number 20 tablets.  They are cautioned about risk for constipation and no to increase fluids, fiber, and stool softener if necessary -Continue incentive spirometer several times daily  #2 very high risk for falls with history of recurrent falls  -Set up referral for home health PT and OT  #3 social.  Wife feeling overwhelmed with his care needs  -We will check to see if we can have [place] worker go out to see what county resources can be offered  #4 chronic atrial fibrillation-on Coumadin  -Continue close follow-up with our Coumadin clinic    I discussed the assessment and treatment plan with the patient. The patient was provided an opportunity to ask questions and all were answered. The patient agreed with the plan and demonstrated an understanding of the instructions.  The patient was advised to call back or seek an in-person evaluation if the symptoms worsen or if the condition fails to improve as anticipated.   Carolann Littler, MD

## 2019-02-18 ENCOUNTER — Other Ambulatory Visit: Payer: Self-pay

## 2019-02-18 ENCOUNTER — Other Ambulatory Visit: Payer: Self-pay | Admitting: Physician Assistant

## 2019-02-18 NOTE — Patient Outreach (Signed)
[facility]) Care Management  02/18/2019  Ricardo Valencia 1929/01/10 [id number]   Referral Date: 02/17/2019 Referral Source: MD referral Referral Reason: Wife needs help with patient in the home   Outreach Attempt: spoke with wife Ricardo Valencia.  She is able to verify HIPAA. She states that she needs help with patient in the home as she needs to run errands and patient cannot be left alone due to frequent falls. She states that patient has had 5 falls within the last 2-3 months.  Patient recent fall resulted in injury to his ribs.  Patient is dealing with pain from the fall but using hydrocodone as needed.  Physician also did home health referral for PT and OT   Social: Patient lives in the home with his spouse who is his primary caregiver.  She does not have much support to help with patient and makes it hard for her to run errands in fear of him falling.     Conditions:  Per record: He has multiple chronic problems include history of CAD, hypertension, chronic systolic heart failure, atrial fibrillation, hypothyroidism, hyperlipidemia, chronic Coumadin therapy.  Wife also shares that he has problems with balance and shaking and sees a neurologist and will see in July again.  She denies any problems with his memory but states that patient does sometime ambulate without his walker.     Medications:  Medications reviewed and wife offers no concerns with medications.   Appointments:  Patient sees physician regularly and did a telehealth visit with PCP on yesterday.    Advanced Directives: Wife states that patient does not have an advanced directive.     Consent: Discussed [facility] services and support.  Wife is agreeable to nurse for education and support and social work for resources for care in the home.   Plan: RN CM will refer to social work for care resources.   RN CM will contact next week and wife agreeable to services.  RN CM will send welcome packet with preventing falls  information. RN CM will send assessment and barriers information to physician.     Ricardo Baseman, RN, MSN [facility] Care Management Care Management Coordinator Direct Line [phone] Toll Free: [phone]  Fax: [phone]

## 2019-02-19 ENCOUNTER — Other Ambulatory Visit: Payer: Self-pay

## 2019-02-19 NOTE — Patient Outreach (Signed)
Ricardo Valencia [facility]) Care Management  02/19/2019  Ricardo Valencia May 01, 1929 [id number]   Successful outreach to patient's spouse, Ricardo Valencia, regarding social work referral for "help in the home".  BSW informed spouse that in-home aide services are typically only covered by Medicare if in conjunction with a skilled service.  Spouse reports that patient is supposed to receive PT but she missed a call from the [facility] agency yesterday.  BSW and spouse discussed personal care services through Medicaid, however, patient is over the income limit to qualify.  Spouse denied the need for long-term, in-home support.  BSW encouraged her to talk with the [facility] provider to determine if they have aides on staff that could provide service while patient is receiving PT.  BSW informed her that this would likely require an MD order as well. No other social work needs identified.  BSW is closing case but encouraged spouse to call if additional needs arise.  Ronn Melena, BSW Social Worker [phone]

## 2019-02-20 ENCOUNTER — Ambulatory Visit: Payer: Self-pay | Admitting: *Deleted

## 2019-02-20 ENCOUNTER — Telehealth: Payer: Self-pay | Admitting: Family Medicine

## 2019-02-20 DIAGNOSIS — I4819 Other persistent atrial fibrillation: Secondary | ICD-10-CM | POA: Diagnosis not present

## 2019-02-20 DIAGNOSIS — B0229 Other postherpetic nervous system involvement: Secondary | ICD-10-CM | POA: Diagnosis not present

## 2019-02-20 DIAGNOSIS — G620 Drug-induced polyneuropathy: Secondary | ICD-10-CM | POA: Diagnosis not present

## 2019-02-20 DIAGNOSIS — I5022 Chronic systolic (congestive) heart failure: Secondary | ICD-10-CM | POA: Diagnosis not present

## 2019-02-20 DIAGNOSIS — S2232XD Fracture of one rib, left side, subsequent encounter for fracture with routine healing: Secondary | ICD-10-CM | POA: Diagnosis not present

## 2019-02-20 DIAGNOSIS — I110 Hypertensive heart disease with heart failure: Secondary | ICD-10-CM | POA: Diagnosis not present

## 2019-02-20 DIAGNOSIS — I252 Old myocardial infarction: Secondary | ICD-10-CM | POA: Diagnosis not present

## 2019-02-20 DIAGNOSIS — E785 Hyperlipidemia, unspecified: Secondary | ICD-10-CM | POA: Diagnosis not present

## 2019-02-20 DIAGNOSIS — I255 Ischemic cardiomyopathy: Secondary | ICD-10-CM | POA: Diagnosis not present

## 2019-02-20 DIAGNOSIS — S32030D Wedge compression fracture of third lumbar vertebra, subsequent encounter for fracture with routine healing: Secondary | ICD-10-CM | POA: Diagnosis not present

## 2019-02-20 DIAGNOSIS — E039 Hypothyroidism, unspecified: Secondary | ICD-10-CM | POA: Diagnosis not present

## 2019-02-20 DIAGNOSIS — W19XXXD Unspecified fall, subsequent encounter: Secondary | ICD-10-CM | POA: Diagnosis not present

## 2019-02-20 DIAGNOSIS — Z7901 Long term (current) use of anticoagulants: Secondary | ICD-10-CM | POA: Diagnosis not present

## 2019-02-20 DIAGNOSIS — Z955 Presence of coronary angioplasty implant and graft: Secondary | ICD-10-CM | POA: Diagnosis not present

## 2019-02-20 DIAGNOSIS — Z9181 History of falling: Secondary | ICD-10-CM | POA: Diagnosis not present

## 2019-02-20 DIAGNOSIS — T462X5D Adverse effect of other antidysrhythmic drugs, subsequent encounter: Secondary | ICD-10-CM | POA: Diagnosis not present

## 2019-02-20 DIAGNOSIS — I2510 Atherosclerotic heart disease of native coronary artery without angina pectoris: Secondary | ICD-10-CM | POA: Diagnosis not present

## 2019-02-20 NOTE — Telephone Encounter (Signed)
Copied from [place] [id number]. Topic: Quick Communication - Home Health Verbal Orders >> Feb 20, 2019  4:07 PM Nils Flack wrote: Caller/Agency: [facility] [place] Number: [phone] Requesting OT/PT/Skilled Nursing/Social Work/Speech Therapy: PT  Frequency: there is a shortage for staffing, want to see if it ok to move start of care to week of 15th.

## 2019-02-20 NOTE — Telephone Encounter (Signed)
[name] calling stating that yesterday the pt was shaving in the bathroom and his legs started to go down while using the walker. [name] states she was able to sit him down on a bench but states the pt does not remember this happening. [name] states that today was the first day that [place] visited the home, but was not sure if it was an aide or nurse.[name] states that during the visit the pulse was checked and was noted to be 42,45, and 44. Usually HR is in the 50-60s. BP and HR checked while on the phone with triage nurse and was noted to be 111/50 and P-43. [name] advised to seek treatment in the ED and to call 911 if she was unable to transport the pt due to the pt having a broken rib and using a walker. [name] verbalized understanding but states she does not know if she is going to be able to get the pt to go to the ED again.   Reason for Disposition . Heart beating very slowly (e.g., < 50 / minute)  (Exception: athlete)  Answer Assessment - Initial Assessment Questions 1. DESCRIPTION: "Please describe your heart rate or heart beat that you are having" (e.g., fast/slow, regular/irregular, skipped or extra beats, "palpitations")     44 2. ONSET: "When did it start?" (Minutes, hours or days)      Earlier today 3. DURATION: "How long does it last" (e.g., seconds, minutes, hours)     Has occurred earlier today 4. PATTERN "Does it come and go, or has it been constant since it started?"  "Does it get worse with exertion?"   "Are you feeling it now?"     Constant today 5. TAP: "Using your hand, can you tap out what you are feeling on a chair or table in front of you, so that I can hear?" (Note: not all patients can do this)       n/a 6. HEART RATE: "Can you tell me your heart rate?" "How many beats in 15 seconds?"  (Note: not all patients can do this)       44 7. RECURRENT SYMPTOM: "Have you ever had this before?" If so, ask: "When was the last time?" and "What happened  that time?"      No not this low 8. CAUSE: "What do you think is causing the palpitations?"     No c/o palpitations 9. CARDIAC HISTORY: "Do you have any history of heart disease?" (e.g., heart attack, angina, bypass surgery, angioplasty, arrhythmia)      Heart attack- 2 years ago, afib 10. OTHER SYMPTOMS: "Do you have any other symptoms?" (e.g., dizziness, chest pain, sweating, difficulty breathing)       No, denies at this time 11. PREGNANCY: "Is there any chance you are pregnant?" "When was your last menstrual period?"       n/a  Protocols used: HEART RATE AND HEARTBEAT QUESTIONS-A-AH

## 2019-02-20 NOTE — Telephone Encounter (Signed)
Ricardo Valencia called from [facility] he does have someone that can do PT. Start of care changed to next week

## 2019-02-22 ENCOUNTER — Other Ambulatory Visit (HOSPITAL_COMMUNITY): Payer: Self-pay | Admitting: Cardiology

## 2019-02-23 ENCOUNTER — Telehealth: Payer: Self-pay | Admitting: Cardiovascular Disease

## 2019-02-23 ENCOUNTER — Telehealth: Payer: Self-pay

## 2019-02-23 ENCOUNTER — Inpatient Hospital Stay: Admission: RE | Admit: 2019-02-23 | Payer: Medicare Other | Source: Ambulatory Visit

## 2019-02-23 NOTE — Telephone Encounter (Signed)
Virtual Visit Pre-Appointment Phone Call  TELEPHONE CALL NOTE  NADIM MALIA has been deemed a candidate for a follow-up tele-health visit to limit community exposure during the Covid-19 pandemic. I spoke with the patient via phone to ensure availability of phone/video source, confirm preferred email & phone number, and discuss instructions and expectations.  I reminded HARGIS VANDYNE to be prepared with any vital sign and/or heart rhythm information that could potentially be obtained via home monitoring, at the time of his visit. I reminded WASEEM SUESS to expect a phone call prior to his visit.  Wife agrees to consent below.  Cleon Gustin, RN 02/23/2019 6:30 PM   FULL LENGTH CONSENT FOR TELE-HEALTH VISIT   I hereby voluntarily request, consent and authorize [facility] and its employed or contracted physicians, physician assistants, nurse practitioners or other licensed health care professionals (the Practitioner), to provide me with telemedicine health care services (the "Services") as deemed necessary by the treating Practitioner. I acknowledge and consent to receive the Services by the Practitioner via telemedicine. I understand that the telemedicine visit will involve communicating with the Practitioner through live audiovisual communication technology and the disclosure of certain medical information by electronic transmission. I acknowledge that I have been given the opportunity to request an in-person assessment or other available alternative prior to the telemedicine visit and am voluntarily participating in the telemedicine visit.  I understand that I have the right to withhold or withdraw my consent to the use of telemedicine in the course of my care at any time, without affecting my right to future care or treatment, and that the Practitioner or I may terminate the telemedicine visit at any time. I understand that I have the right to inspect all information  obtained and/or recorded in the course of the telemedicine visit and may receive copies of available information for a reasonable fee.  I understand that some of the potential risks of receiving the Services via telemedicine include:  Marland Kitchen Delay or interruption in medical evaluation due to technological equipment failure or disruption; . Information transmitted may not be sufficient (e.g. poor resolution of images) to allow for appropriate medical decision making by the Practitioner; and/or  . In rare instances, security protocols could fail, causing a breach of personal health information.  Furthermore, I acknowledge that it is my responsibility to provide information about my medical history, conditions and care that is complete and accurate to the best of my ability. I acknowledge that Practitioner's advice, recommendations, and/or decision may be based on factors not within their control, such as incomplete or inaccurate data provided by me or distortions of diagnostic images or specimens that may result from electronic transmissions. I understand that the practice of medicine is not an exact science and that Practitioner makes no warranties or guarantees regarding treatment outcomes. I acknowledge that I will receive a copy of this consent concurrently upon execution via email to the email address I last provided but may also request a printed copy by calling the office of [place].    I understand that my insurance will be billed for this visit.   I have read or had this consent read to me. . I understand the contents of this consent, which adequately explains the benefits and risks of the Services being provided via telemedicine.  . I have been provided ample opportunity to ask questions regarding this consent and the Services and have had my questions answered to my satisfaction. Marland Kitchen I  give my informed consent for the services to be provided through the use of telemedicine in my medical care   By participating in this telemedicine visit I agree to the above.

## 2019-02-23 NOTE — Telephone Encounter (Signed)
New Message    Patient's wife calling stating patient has been falling a lot, legs have been giving out on him.  Also his pulse dropped down to 42, 45, and was 54 today. Patient wife is a little confused about the BP would like a nurse to give her a call back please.

## 2019-02-23 NOTE — Telephone Encounter (Signed)
Called and spoke to patient's wife. She states that the patient has been having frequent falls lately. Denies hitting his head or LOC. She states that his SBP has been as low as 87 and HR as low as 45. She states that today his BP is 108/52 HR 52. She states that the patient has not had any lightheadedness or dizziness but states that his legs just give out when he falls. Patient takes amio 200 mg QD, lasix 40 mg BID, lisinopril 2.5 mg QD, and Toprol 25 mg QD (she states that she quarters a 100 mg tablet). She states that she did not give him the Toprol today. Patient has Hx of Afib and is on coumadin. Last echo with EF 55-60% and mild AS. Offered appointment for tomorrow and wife states that she has someone coming to the house tomorrow but is not sure of what time and prefers to arrange appt on Wednesday. Discussed with Ermalinda Barrios, PA and will arrange for a Virtual Visit and at the visit will arrange for [facility] to go out to get labs and perform a rhythm strip. Instructed for the patient to hold Toprol until after the visit and more instruction will be given at that time. Instructed for the patient to let us know if he develops any Sx or if SBP <100 prior to his appt. Wife verbalized understanding and thanked me for the call. Appt made for 6/10.

## 2019-02-23 NOTE — Telephone Encounter (Signed)
Pts wife has been informed and is going to call caridology

## 2019-02-23 NOTE — Telephone Encounter (Signed)
Copied from [place] [phone]. Topic: General - Other >> Feb 23, 2019  8:26 AM Oneta Rack wrote: Caller name: Crystal  Relation to pt: RN from [facility]  Call back number: [phone]   Reason for call:  Requesting skilled nursing 2x 3 1x 6

## 2019-02-23 NOTE — Telephone Encounter (Signed)
I advise that they contact cardiology regarding his low pulse.  May need to have Toprol reduced or d/ced.   He does see Cardiology already so should not need referral.

## 2019-02-24 ENCOUNTER — Other Ambulatory Visit: Payer: Self-pay

## 2019-02-24 NOTE — Telephone Encounter (Signed)
OK 

## 2019-02-24 NOTE — Patient Outreach (Signed)
[facility]) Care Management  02/24/2019  Ricardo Valencia 1929-08-26 [id number]   Telephone call to wife for weekly outreach support. Wife reports that things are going good this week.  She reports that patient has had no falls.  Discussed fall precautions.  Discussed with spouse home health coming out.  Wife somewhat confused about everyone coming out but she did say that nurse did come but has not heard from therapy.  Per chart PT to start this week.  Advised wife that they would call before visiting.  She verbalized understanding.  Patient has an appointment with the cardiologist tomorrow to discuss fluctuations in heart rate.  Wife denies any problems at this time and is appreciative of check in.    Plan: RN CM will outreach next week and wife agrees to next outreach.    Jone Baseman, RN, MSN [place] Management Care Management Coordinator Direct Line [phone] Cell [phone] Toll Free: [phone]  Fax: [phone]

## 2019-02-24 NOTE — Telephone Encounter (Signed)
Dr. [name] is it okay to give the verbal order for this?

## 2019-02-24 NOTE — Telephone Encounter (Signed)
Verbal order given to Crystal. Nothing further needed at this time

## 2019-02-25 ENCOUNTER — Telehealth: Payer: Self-pay | Admitting: Family Medicine

## 2019-02-25 ENCOUNTER — Telehealth (INDEPENDENT_AMBULATORY_CARE_PROVIDER_SITE_OTHER): Payer: Medicare Other | Admitting: Physician Assistant

## 2019-02-25 ENCOUNTER — Telehealth: Payer: Self-pay | Admitting: *Deleted

## 2019-02-25 ENCOUNTER — Other Ambulatory Visit: Payer: Self-pay

## 2019-02-25 ENCOUNTER — Encounter: Payer: Self-pay | Admitting: Physician Assistant

## 2019-02-25 VITALS — BP 126/60 | HR 65 | Ht 68.0 in | Wt 177.9 lb

## 2019-02-25 DIAGNOSIS — R609 Edema, unspecified: Secondary | ICD-10-CM | POA: Diagnosis not present

## 2019-02-25 DIAGNOSIS — R001 Bradycardia, unspecified: Secondary | ICD-10-CM | POA: Diagnosis not present

## 2019-02-25 DIAGNOSIS — I255 Ischemic cardiomyopathy: Secondary | ICD-10-CM | POA: Diagnosis not present

## 2019-02-25 DIAGNOSIS — I4811 Longstanding persistent atrial fibrillation: Secondary | ICD-10-CM

## 2019-02-25 DIAGNOSIS — I959 Hypotension, unspecified: Secondary | ICD-10-CM

## 2019-02-25 DIAGNOSIS — I2510 Atherosclerotic heart disease of native coronary artery without angina pectoris: Secondary | ICD-10-CM

## 2019-02-25 DIAGNOSIS — E782 Mixed hyperlipidemia: Secondary | ICD-10-CM

## 2019-02-25 DIAGNOSIS — I952 Hypotension due to drugs: Secondary | ICD-10-CM | POA: Diagnosis not present

## 2019-02-25 DIAGNOSIS — S32030D Wedge compression fracture of third lumbar vertebra, subsequent encounter for fracture with routine healing: Secondary | ICD-10-CM | POA: Diagnosis not present

## 2019-02-25 DIAGNOSIS — R296 Repeated falls: Secondary | ICD-10-CM

## 2019-02-25 DIAGNOSIS — S2232XD Fracture of one rib, left side, subsequent encounter for fracture with routine healing: Secondary | ICD-10-CM | POA: Diagnosis not present

## 2019-02-25 DIAGNOSIS — G620 Drug-induced polyneuropathy: Secondary | ICD-10-CM | POA: Diagnosis not present

## 2019-02-25 DIAGNOSIS — I5032 Chronic diastolic (congestive) heart failure: Secondary | ICD-10-CM | POA: Diagnosis not present

## 2019-02-25 DIAGNOSIS — W19XXXD Unspecified fall, subsequent encounter: Secondary | ICD-10-CM | POA: Diagnosis not present

## 2019-02-25 DIAGNOSIS — T462X5D Adverse effect of other antidysrhythmic drugs, subsequent encounter: Secondary | ICD-10-CM | POA: Diagnosis not present

## 2019-02-25 NOTE — Telephone Encounter (Signed)
Verbal orders given to Michelle. 

## 2019-02-25 NOTE — Telephone Encounter (Signed)
[place] Visit Initial Request  Date of Request ([place]):  February 25, 2019  Requesting Provider:  Ermalinda Barrios, [facility]    Agency Requested:    [facility] Contact:  Glory Buff, NP [address] [place], [place] [address] Phone #:  [phone] Fax #:  [phone]  Patient Demographic Information: Name:  CAYDENCE ENCK Age:  85 y.o.   DOB:  November 19, 1928  MRN:  [id number]   Address:   [place] [address]   Phone Numbers:   Home Phone [phone]  Mobile [phone]     Emergency Contact Information on File:   Contact Information    Name Relation Home Work Mobile   Romero, Letizia [phone]  [phone]      The above family members may be contacted for information on this patient (review DPR on file):  Yes    Patient Clinical Information:  Primary Care Provider:  Eulas Post, MD  Primary Cardiologist:  Lauree Chandler, MD  Primary Electrophysiologist:  None   Past Medical Hx: Mr. Easler  has a past medical history of CAD (coronary artery disease), Chronic systolic heart failure ([place]), Hay fever, History of echocardiogram, Hyperlipidemia, Hypertension, and Persistent atrial fibrillation.   Allergies: He has No Known Allergies.   Medications: Current Outpatient Medications on File Prior to Visit  Medication Sig  . amiodarone (PACERONE) 200 MG tablet Take 1 tablet by mouth once daily  . Ascorbic Acid (VITAMIN C) 1000 MG tablet Take 1,000 mg by mouth daily.  Marland Kitchen atorvastatin (LIPITOR) 80 MG tablet Take 1 tablet (80 mg total) by mouth daily at 6 PM.  . furosemide (LASIX) 40 MG tablet Take 1 tablet by mouth twice daily  . gabapentin (NEURONTIN) 300 MG capsule TAKE 1 CAPSULE BY MOUTH EVERY DAY AT BEDTIME (Patient taking differently: Take 300 mg by mouth 2 (two) times daily. )  . HYDROcodone-acetaminophen (NORCO/VICODIN) 5-325 MG tablet Take 1 tablet by mouth every 6 (six) hours as  needed.  Marland Kitchen levothyroxine (SYNTHROID, LEVOTHROID) 25 MCG tablet Take 1 tablet (25 mcg total) by mouth daily before breakfast.  . lisinopril (PRINIVIL,ZESTRIL) 2.5 MG tablet Take 2.5 mg by mouth daily.  . metoprolol succinate (TOPROL-XL) 50 MG 24 hr tablet Take 1 tablet by mouth once daily  . nitroGLYCERIN (NITROSTAT) 0.4 MG SL tablet Place 1 tablet (0.4 mg total) under the tongue every 5 (five) minutes x 3 doses as needed for chest pain.  . potassium chloride (K-DUR) 10 MEQ tablet Take 1 tablet by mouth once daily  . primidone (MYSOLINE) 50 MG tablet Take 1 tablet (50 mg total) by mouth at bedtime. 1 tab 1 hour before bed, may repeat once during the night when up to the bathroom  . warfarin (COUMADIN) 5 MG tablet TAKE AS DIRECTED BY MOUTH BY COUMADIN CLINIC (Patient taking differently: 1/2 tablet sat, sun, tues and thur. And then 1 tablet mon,wed,fri)   No current facility-administered medications on file prior to visit.      Social Hx: He  reports that he has never smoked. He has never used smokeless tobacco. He reports current alcohol use of about 7.0 - 14.0 standard drinks of alcohol per week. He reports that he does not use drugs.    Diagnosis/Reason for Visit:   BRADYCARDIA, HYPOTENSION, EDEMA  Services Requested:  Vital Signs (BP, Pulse, O2, Weight)  Orthostatic Vital Signs  Physical Exam  Medication Reconciliation  Rhythm Strip Engineer, manufacturing Device)  Labs:  BMET, TSH, CBC, PRO BNP  # of Visits Needed/Frequency per Week: 1

## 2019-02-25 NOTE — Telephone Encounter (Signed)
OK 

## 2019-02-25 NOTE — Patient Instructions (Signed)
Medication Instructions:  Your physician has recommended you make the following change in your medication:   STOP: metoprolol succinate (Toprol-XL)   Lab work: We will have Home Health come out and draw labs (CBC, TSH, BMET, BNP), obtain a rhythm strip, physical exam, and vital signs including orthostatic blood pressures  If you have labs (blood work) drawn today and your tests are completely normal, you will receive your results only by: Marland Kitchen MyChart Message (if you have MyChart) OR . A paper copy in the mail If you have any lab test that is abnormal or we need to change your treatment, we will call you to review the results.  Testing/Procedures: None ordered  Follow-Up: . Follow up with Ermalinda Barrios, PA via PHONE visit on 03/03/19 AT 12:00 PM  Any Other Special Instructions Will Be Listed Below (If Applicable).  Wear compression stockings

## 2019-02-25 NOTE — Telephone Encounter (Signed)
OK to order as requested.

## 2019-02-25 NOTE — Telephone Encounter (Signed)
Copied from [place] [phone]. Topic: Quick Communication - Home Health Verbal Orders >> Feb 25, 2019  2:40 PM Lennox Solders wrote: [facility]: [phone] Requesting OT 1x1, 2x3 and 1x1

## 2019-02-25 NOTE — Telephone Encounter (Signed)
Copied from [place] [phone]. Topic: Quick Communication - Home Health Verbal Orders >> Feb 25, 2019  1:49 PM Berneta Levins wrote: Caller/Agency: Caryl Pina with [facility] Number: [phone], OK to leave a message Requesting OT/PT/Skilled Nursing/Social Work/Speech Therapy: social work evaluation and skilled nurse visit Frequency: move to next week for social work evaluation and skilled nurse visit either tomorrow or Friday

## 2019-02-25 NOTE — Telephone Encounter (Signed)
VM left for okay for verbal orders

## 2019-02-25 NOTE — Progress Notes (Signed)
Virtual Visit via Video Note   This visit type was conducted due to national recommendations for restrictions regarding the COVID-19 Pandemic (e.g. social distancing) in an effort to limit this patient's exposure and mitigate transmission in our community.  Due to his co-morbid illnesses, this patient is at least at moderate risk for complications without adequate follow up.  This format is felt to be most appropriate for this patient at this time.  All issues noted in this document were discussed and addressed.  A limited physical exam was performed with this format.  Please refer to the patient's chart for his consent to telehealth for [facility].   Date:  02/25/2019   ID:  Ricardo Valencia, DOB 1929/07/04, MRN [id number]  Patient Location: Home Provider Location: Home  PCP:  Eulas Post, MD  Cardiologist:  Lauree Chandler, MD   Electrophysiologist:  None   Evaluation Performed:  Follow-Up Visit  Chief Complaint:  Low BP and HR  History of Present Illness:    Ricardo Valencia is a 85 y.o. male with with persistent atrial fibrillation, CAD status post DES to the mid LAD 2016, echo at that time LVEF 25 to 30% improved to 45 to 50% atrial fibrillation 2016 cardioverted back to normal sinus rhythm.  Patient had recurrent atrial fibrillation and was on Tikosyn but stopped because of prolonged QT interval and now is on amiodarone. Last echo 06/2017 EF 55 to 60% with basal inferior akinesis mildly dilated RV and mildly decreased RV systolic function.   Patient was last seen by Dr. Aundra Dubin in the heart failure clinic 11/2017 and was doing well.  No aspirin given stable CAD and Coumadin use.  He suspected OSA but the patient did not want to do a sleep study.   Patient 11/05/2018 at which time his heart rate was in the 40s and had been slow for about 6 months.  I decreased his Toprol to 25 mg once daily.  He was also getting extra salt in his diet I gave him an extra Lasix for the  1 day.  Left/20 showed normal LVEF 55 to 60% with some diastolic dysfunction and mild aortic stenosis.  Patient's wife called in yesterday complaining of multiple falls and slow pulse.  As well as low blood pressure.  He did have a fall 12/08/2018 and had a L3 lumbar compression fracture.  Another fall 02/12/2019 when he missed a step trying to put on his pants and fell hitting a dresser striking his left lateral rib prompting ER visit.  He had minimally displaced left eighth rib fracture.  Patient says his balance is off and he misses steps. Legs swollen right > left. No shortness of breath. BP as low as 87 systolic. Hasn't had toprol in 2 days. BP much better today and P 65. Watching salt closely. Has lost 9 lbs since March.   The patient does not have symptoms concerning for COVID-19 infection (fever, chills, cough, or new shortness of breath).    Past Medical History:  Diagnosis Date  . CAD (coronary artery disease)    DES to mid LAD 11/10/2014  . Chronic systolic heart failure ([place])   . Hay fever    hay fever, allergies  . History of echocardiogram    Echo 3/17: EF 40%, diff HK, trivial AI, trivial MR, mod LAE, mild reduced RVSF, mod RAE  . Hyperlipidemia   . Hypertension   . Persistent atrial fibrillation    Past Surgical History:  Procedure Laterality  Date  . APPENDECTOMY  1947  . CARDIOVERSION N/A 12/30/2014   Procedure: CARDIOVERSION;  Surgeon: Jerline Pain, MD;  Location: [facility];  Service: Cardiovascular;  Laterality: N/A;  . CORONARY ANGIOPLASTY WITH STENT PLACEMENT    . LEFT HEART CATHETERIZATION WITH CORONARY ANGIOGRAM N/A 11/10/2014   Procedure: LEFT HEART CATHETERIZATION WITH CORONARY ANGIOGRAM;  Surgeon: Wellington Hampshire, MD;  Location: [place] [place] CATH LAB;  Service: Cardiovascular;  Laterality: N/A;  . PERCUTANEOUS CORONARY STENT INTERVENTION (PCI-S)  11/10/2014   Procedure: PERCUTANEOUS CORONARY STENT INTERVENTION (PCI-S);  Surgeon: Wellington Hampshire, MD;  Location[facility] CATH  LAB;  Service: Cardiovascular;;     Current Meds  Medication Sig  . amiodarone (PACERONE) 200 MG tablet Take 1 tablet by mouth once daily  . Ascorbic Acid (VITAMIN C) 1000 MG tablet Take 1,000 mg by mouth daily.  Marland Kitchen atorvastatin (LIPITOR) 80 MG tablet Take 1 tablet (80 mg total) by mouth daily at 6 PM.  . furosemide (LASIX) 40 MG tablet Take 1 tablet by mouth twice daily  . gabapentin (NEURONTIN) 300 MG capsule TAKE 1 CAPSULE BY MOUTH EVERY DAY AT BEDTIME (Patient taking differently: Take 300 mg by mouth 2 (two) times daily. )  . HYDROcodone-acetaminophen (NORCO/VICODIN) 5-325 MG tablet Take 1 tablet by mouth every 6 (six) hours as needed.  Marland Kitchen levothyroxine (SYNTHROID, LEVOTHROID) 25 MCG tablet Take 1 tablet (25 mcg total) by mouth daily before breakfast.  . lisinopril (PRINIVIL,ZESTRIL) 2.5 MG tablet Take 2.5 mg by mouth daily.  . nitroGLYCERIN (NITROSTAT) 0.4 MG SL tablet Place 1 tablet (0.4 mg total) under the tongue every 5 (five) minutes x 3 doses as needed for chest pain.  . potassium chloride (K-DUR) 10 MEQ tablet Take 1 tablet by mouth once daily  . primidone (MYSOLINE) 50 MG tablet Take 1 tablet (50 mg total) by mouth at bedtime. 1 tab 1 hour before bed, may repeat once during the night when up to the bathroom  . warfarin (COUMADIN) 5 MG tablet TAKE AS DIRECTED BY MOUTH BY COUMADIN CLINIC (Patient taking differently: 1/2 tablet sat, sun, tues and thur. And then 1 tablet mon,wed,fri)  . [DISCONTINUED] metoprolol succinate (TOPROL-XL) 50 MG 24 hr tablet Take 1 tablet by mouth once daily     Allergies:   Patient has no known allergies.   Social History   Tobacco Use  . Smoking status: Never Smoker  . Smokeless tobacco: Never Used  . Tobacco comment: "smoked when I was 22"  Substance Use Topics  . Alcohol use: Yes    Alcohol/week: 7.0 - 14.0 standard drinks    Types: 7 - 14 Cans of beer per week    Comment: occ  . Drug use: No     Family Hx: The patient's family history  includes Cancer in his father and mother.  ROS:   Please see the history of present illness.      All other systems reviewed and are negative.   Prior CV studies:   The following studies were reviewed today: Echo 11/2018  IMPRESSIONS      1. The left ventricle has normal systolic function, with an ejection fraction of 55-60%. The cavity size was normal. Left ventricular diastolic Doppler parameters are consistent with pseudonormalization.  2. The right ventricle has normal systolic function. The cavity was normal. There is no increase in right ventricular wall thickness.  3. Left atrial size was moderately dilated.  4. The mitral valve is normal in structure. Mild thickening of the mitral  valve leaflet. Mild calcification of the mitral valve leaflet.  5. The tricuspid valve is normal in structure.  6. The aortic valve has an indeterminant number of cusps Moderate thickening of the aortic valve Moderate calcification of the aortic valve. Aortic valve regurgitation is mild by color flow Doppler. mild stenosis of the aortic valve.  7. The pulmonic valve was grossly normal. Pulmonic valve regurgitation is mild by color flow Doppler.  8. There is mild dilatation of the aortic root measuring 39 mm.   FINDINGS  Left Ventricle: The left ventricle has normal systolic function, with an ejection fraction of 55-60%. The cavity size was normal. There is no increase in left ventricular wall thickness. Left ventricular diastolic Doppler parameters are consistent with  pseudonormalization Right Ventricle: The right ventricle has normal systolic function. The cavity was normal. There is no increase in right ventricular wall thickness. Left Atrium: left atrial size was moderately dilated Right Atrium: right atrial size was normal in size. Right atrial pressure is estimated at 3 mmHg. Interatrial Septum: No atrial level shunt detected by color flow Doppler. Pericardium: There is no evidence of pericardial  effusion. Mitral Valve: The mitral valve is normal in structure. Mild thickening of the mitral valve leaflet. Mild calcification of the mitral valve leaflet. Mitral valve regurgitation is mild by color flow Doppler. Tricuspid Valve: The tricuspid valve is normal in structure. Tricuspid valve regurgitation is mild by color flow Doppler. Aortic Valve: The aortic valve has an indeterminant number of cusps Moderate thickening of the aortic valve Moderate calcification of the aortic valve. Aortic valve regurgitation is mild by color flow Doppler. There is mild stenosis of the aortic valve,  with a calculated valve area of 1.93 cm. Pulmonic Valve: The pulmonic valve was grossly normal. Pulmonic valve regurgitation is mild by color flow Doppler. Aorta: There is mild dilatation of the aortic root measuring 39 mm. Venous: The inferior vena cava is normal in size with greater than 50% respiratory variability. Compared to previous exam: 06/27/17 EF 55-60%.     Labs/Other Tests and Data Reviewed:    EKG:  An ECG dated 11/05/18 was personally reviewed today and demonstrated:  sinus bradycardia with RBBB and first degree AV block  Recent Labs: 11/05/2018: TSH 10.390 12/08/2018: ALT 109; BUN 15; Creatinine, Ser 1.50; Hemoglobin 13.2; Platelets 122; Potassium 3.9; Sodium 139   Recent Lipid Panel Lab Results  Component Value Date/Time   CHOL 178 10/01/2017 01:17 PM   TRIG 108 10/01/2017 01:17 PM   HDL 63 10/01/2017 01:17 PM   CHOLHDL 2.8 10/01/2017 01:17 PM   LDLCALC 93 10/01/2017 01:17 PM   LDLDIRECT 191.1 01/04/2011 12:18 PM    Wt Readings from Last 3 Encounters:  02/25/19 177 lb 14.4 oz (80.7 kg)  12/08/18 186 lb (84.4 kg)  11/11/18 186 lb 3.2 oz (84.5 kg)     Objective:    Vital Signs:  BP 126/60   Pulse 65   Ht 5\' 8"  (1.727 m)   Wt 177 lb 14.4 oz (80.7 kg)   BMI 27.05 kg/m    VITAL SIGNS:  reviewed GEN:  no acute distress RESPIRATORY:  normal respiratory effort, symmetric expansion  CARDIOVASCULAR:  lower extremity edema noted right greater than left  ASSESSMENT & PLAN:    1. Bradycardia-stop troprol and may need to decrease amiodarone. Will have [place] check on patient to check labs, rhythm strip, orthostatic BP 2. Hypotension improving since toprol stopped 3. Chronic diastolic CHF increase leg edema-check bmet and bnp 4.  Falls seem to be mechanical lumbar fracture 11/2018, rib fracture 02/12/19 need to reassess need for coumadin 5. CAF on Amiodarone with bradycardia-stop troprol.  6. CAD without angina 7. HLD  LDL 93 09/2017  COVID-19 Education: The signs and symptoms of COVID-19 were discussed with the patient and how to seek care for testing (follow up with PCP or arrange E-visit).    The importance of social distancing was discussed today.  Time:   Today, I have spent 19  minutes with the patient with telehealth technology discussing the above problems.     Medication Adjustments/Labs and Tests Ordered: Current medicines are reviewed at length with the patient today.  Concerns regarding medicines are outlined above.   Tests Ordered: No orders of the defined types were placed in this encounter.   Medication Changes: No orders of the defined types were placed in this encounter.   Disposition:  Follow up in 1 week(s) Ermalinda Barrios PA-C  Signed, Ermalinda Barrios, PA-C  02/25/2019 11:58 AM    [place]

## 2019-02-26 DIAGNOSIS — G620 Drug-induced polyneuropathy: Secondary | ICD-10-CM | POA: Diagnosis not present

## 2019-02-26 DIAGNOSIS — T462X5D Adverse effect of other antidysrhythmic drugs, subsequent encounter: Secondary | ICD-10-CM | POA: Diagnosis not present

## 2019-02-26 DIAGNOSIS — W19XXXD Unspecified fall, subsequent encounter: Secondary | ICD-10-CM | POA: Diagnosis not present

## 2019-02-26 DIAGNOSIS — S2232XD Fracture of one rib, left side, subsequent encounter for fracture with routine healing: Secondary | ICD-10-CM | POA: Diagnosis not present

## 2019-02-26 DIAGNOSIS — S32030D Wedge compression fracture of third lumbar vertebra, subsequent encounter for fracture with routine healing: Secondary | ICD-10-CM | POA: Diagnosis not present

## 2019-02-26 DIAGNOSIS — I255 Ischemic cardiomyopathy: Secondary | ICD-10-CM | POA: Diagnosis not present

## 2019-02-26 LAB — BASIC METABOLIC PANEL
BUN/Creatinine Ratio: 9 — ABNORMAL LOW (ref 10–24)
BUN: 17 mg/dL (ref 8–27)
CO2: 23 mmol/L (ref 20–29)
Calcium: 7.7 mg/dL — ABNORMAL LOW (ref 8.6–10.2)
Chloride: 101 mmol/L (ref 96–106)
Creatinine, Ser: 1.85 mg/dL — ABNORMAL HIGH (ref 0.76–1.27)
GFR calc Af Amer: 37 mL/min/{1.73_m2} — ABNORMAL LOW (ref >59)
GFR calc non Af Amer: 32 mL/min/{1.73_m2} — ABNORMAL LOW (ref >59)
Glucose: 92 mg/dL (ref 65–99)
Potassium: 4 mmol/L (ref 3.5–5.2)
Sodium: 139 mmol/L (ref 134–144)

## 2019-02-26 LAB — CBC
Hematocrit: 31.8 % — ABNORMAL LOW (ref 37.5–51.0)
Hemoglobin: 10.9 g/dL — ABNORMAL LOW (ref 13.0–17.7)
MCH: 30.8 pg (ref 26.6–33.0)
MCHC: 34.3 g/dL (ref 31.5–35.7)
MCV: 90 fL (ref 79–97)
Platelets: 118 10*3/uL — ABNORMAL LOW (ref 150–450)
RBC: 3.54 x10E6/uL — ABNORMAL LOW (ref 4.14–5.80)
RDW: 13.8 % (ref 11.6–15.4)
WBC: 4.9 10*3/uL (ref 3.4–10.8)

## 2019-02-26 LAB — TSH: TSH: 9.23 u[IU]/mL — ABNORMAL HIGH (ref 0.450–4.500)

## 2019-02-26 LAB — PRO B NATRIURETIC PEPTIDE: NT-Pro BNP: 2095 pg/mL — ABNORMAL HIGH (ref 0–486)

## 2019-02-27 ENCOUNTER — Telehealth: Payer: Self-pay

## 2019-02-27 ENCOUNTER — Other Ambulatory Visit: Payer: Self-pay

## 2019-02-27 DIAGNOSIS — R609 Edema, unspecified: Secondary | ICD-10-CM

## 2019-02-27 DIAGNOSIS — W19XXXD Unspecified fall, subsequent encounter: Secondary | ICD-10-CM | POA: Diagnosis not present

## 2019-02-27 DIAGNOSIS — I255 Ischemic cardiomyopathy: Secondary | ICD-10-CM | POA: Diagnosis not present

## 2019-02-27 DIAGNOSIS — G620 Drug-induced polyneuropathy: Secondary | ICD-10-CM | POA: Diagnosis not present

## 2019-02-27 DIAGNOSIS — S2232XD Fracture of one rib, left side, subsequent encounter for fracture with routine healing: Secondary | ICD-10-CM | POA: Diagnosis not present

## 2019-02-27 DIAGNOSIS — T462X5D Adverse effect of other antidysrhythmic drugs, subsequent encounter: Secondary | ICD-10-CM | POA: Diagnosis not present

## 2019-02-27 DIAGNOSIS — S32030D Wedge compression fracture of third lumbar vertebra, subsequent encounter for fracture with routine healing: Secondary | ICD-10-CM | POA: Diagnosis not present

## 2019-02-27 MED ORDER — AMIODARONE HCL 200 MG PO TABS: 100.0000 mg | ORAL_TABLET | Freq: Every day | ORAL | 3 refills | Status: DC

## 2019-02-27 NOTE — Telephone Encounter (Signed)
[facility] Visit Follow Up Request   Date of Request ([place]):  February 27, 2019  Requesting Provider:  Ermalinda Barrios, PA    Agency Requested:    [facility] Contact:  Glory Buff, NP [address] [place], [place] [address] Phone #:  [phone] Fax #:  [phone]  Patient Demographic Information: Name:  Ricardo Valencia Age:  85 y.o.   DOB:  12/24/1928  MRN:  [id number]   Home visit progress note(s), lab results, telemetry strips, etc were reviewed.  Provider Recommendations: Notes recorded by Imogene Burn, PA-C on 02/26/2019 at 12:02 PM EDT  I spoke with patient's wife and went over above increase in lasix for 4 days and decrease amiodarone to 100 mg daily. I also told them to increase his potassium to 2 tablets daily for 4 days then one daily. Asked her to Call PCP about thyroid med adjustment.Avoid gatorade and follow 2 gm sodium diet.Please arrange for [facility] to see next week with bmet and bnp. F/u with me next week. (Patient has appointment with Ermalinda Barrios, PA on 6/16)  Follow up home services requested:  Physical Exam  Medication Reconciliation  Labs:  BMET, BNP

## 2019-03-02 ENCOUNTER — Telehealth: Payer: Self-pay | Admitting: Family Medicine

## 2019-03-02 DIAGNOSIS — R001 Bradycardia, unspecified: Secondary | ICD-10-CM | POA: Insufficient documentation

## 2019-03-02 DIAGNOSIS — I255 Ischemic cardiomyopathy: Secondary | ICD-10-CM | POA: Diagnosis not present

## 2019-03-02 DIAGNOSIS — S2232XD Fracture of one rib, left side, subsequent encounter for fracture with routine healing: Secondary | ICD-10-CM | POA: Diagnosis not present

## 2019-03-02 DIAGNOSIS — T462X5D Adverse effect of other antidysrhythmic drugs, subsequent encounter: Secondary | ICD-10-CM | POA: Diagnosis not present

## 2019-03-02 DIAGNOSIS — I5032 Chronic diastolic (congestive) heart failure: Secondary | ICD-10-CM | POA: Insufficient documentation

## 2019-03-02 DIAGNOSIS — G620 Drug-induced polyneuropathy: Secondary | ICD-10-CM | POA: Diagnosis not present

## 2019-03-02 DIAGNOSIS — S32030D Wedge compression fracture of third lumbar vertebra, subsequent encounter for fracture with routine healing: Secondary | ICD-10-CM | POA: Diagnosis not present

## 2019-03-02 DIAGNOSIS — W19XXXD Unspecified fall, subsequent encounter: Secondary | ICD-10-CM | POA: Diagnosis not present

## 2019-03-02 NOTE — Telephone Encounter (Signed)
Copied from [place] [id number]. Topic: Quick Communication - Home Health Verbal Orders >> Mar 02, 2019 12:57 PM Nils Flack wrote: Caller/Agency: [facility] Shelly Bombard Number: [phone] Requesting OT/PT/Skilled Nursing/Social Work/Speech Therapy: PT  Frequency: 2 WEEK 5 1 WEEK 2 Gait balance strength effective 03/01/19

## 2019-03-02 NOTE — Progress Notes (Signed)
Virtual Visit via Video Note   This visit type was conducted due to national recommendations for restrictions regarding the COVID-19 Pandemic (e.g. social distancing) in an effort to limit this patient's exposure and mitigate transmission in our community.  Due to his co-morbid illnesses, this patient is at least at moderate risk for complications without adequate follow up.  This format is felt to be most appropriate for this patient at this time.  All issues noted in this document were discussed and addressed.  A limited physical exam was performed with this format.  Please refer to the patient's chart for his consent to telehealth for [facility].   Date:  03/03/2019   ID:  Ricardo Valencia, DOB 1928-12-02, MRN [id number]  Patient Location: Home Provider Location: Home  PCP:  Eulas Post, MD  Cardiologist:  Lauree Chandler, MD Electrophysiologist:  None   Evaluation Performed:  Follow-Up Visit  Chief Complaint:  edema  History of Present Illness:    [name] is a 85 y.o. male with persistent atrial fibrillation, CAD status post DES to the mid LAD 2016, echo at that time LVEF 25 to 30% improved to 45 to 50% atrial fibrillation 2016 cardioverted back to normal sinus rhythm.  Patient had recurrent atrial fibrillation and was on Tikosyn but stopped because of prolonged QT interval and now is on amiodarone. Last echo 06/2017 EF 55 to 60% with basal inferior akinesis mildly dilated RV and mildly decreased RV systolic function.   Patient was last seen by Dr. Aundra Dubin in the heart failure clinic 11/2017 and was doing well.  No aspirin given stable CAD and Coumadin use.  He suspected OSA but the patient did not want to do a sleep study.   I had an office visit with the patient 11/05/2018 at which time his heart rate was in the 40s and had been slow for about 6 months.  I decreased his Toprol to 25 mg once daily.  He was also getting extra salt in his diet I gave him an  extra Lasix for the 1 day.  Echo showed normal LVEF 55 to 60% with some diastolic dysfunction and mild aortic stenosis.   I saw the patient with a telemedicine visit 02/25/2019 because of his wife complaining of multiple falls and slow pulse.  As well as low blood pressure.  He did have a fall 12/08/2018 and had a L3 lumbar compression fracture.  Another fall 02/12/2019 when he missed a step trying to put on his pants and fell hitting a dresser striking his left lateral rib prompting ER visit.  He had minimally displaced left eighth rib fracture.  He was bradycardic and I stopped his Toprol and decreased his amiodarone to 100 mg a day.  I have asked home health to check on him with labs orthostatic blood pressures and rhythm strip.  Home health described significant edema and I increased his Lasix for 4 days and instructed on 2 g sodium diet.  He was drinking a lot of Gatorade.  Blood pressure was stable.  Telemetry strip was unreadable. BNP was 2095 hbg 10.9 Crt up 1.85 from 1.50 in March.  Patient weight is down 3 lbs. Swelling is down, still a little swelling.      The patient does not have symptoms concerning for COVID-19 infection (fever, chills, cough, or new shortness of breath).    Past Medical History:  Diagnosis Date   CAD (coronary artery disease)    DES to mid LAD  2/62/0355   Chronic systolic heart failure (HCC)    Hay fever    hay fever, allergies   History of echocardiogram    Echo 3/17: EF 40%, diff HK, trivial AI, trivial MR, mod LAE, mild reduced RVSF, mod RAE   Hyperlipidemia    Hypertension    Persistent atrial fibrillation    Past Surgical History:  Procedure Laterality Date   APPENDECTOMY  1947   CARDIOVERSION N/A 12/30/2014   Procedure: CARDIOVERSION;  Surgeon: Jerline Pain, MD;  Location: [facility] ENDOSCOPY;  Service: Cardiovascular;  Laterality: N/A;   CORONARY ANGIOPLASTY WITH STENT PLACEMENT     LEFT HEART CATHETERIZATION WITH CORONARY ANGIOGRAM N/A 11/10/2014    Procedure: LEFT HEART CATHETERIZATION WITH CORONARY ANGIOGRAM;  Surgeon: Wellington Hampshire, MD;  Location: [facility] CATH LAB;  Service: Cardiovascular;  Laterality: N/A;   PERCUTANEOUS CORONARY STENT INTERVENTION (PCI-S)  11/10/2014   Procedure: PERCUTANEOUS CORONARY STENT INTERVENTION (PCI-S);  Surgeon: Wellington Hampshire, MD;  Location: [facility] CATH LAB;  Service: Cardiovascular;;     Current Meds  Medication Sig   amiodarone (PACERONE) 200 MG tablet Take 0.5 tablets (100 mg total) by mouth daily.   Ascorbic Acid (VITAMIN C) 1000 MG tablet Take 1,000 mg by mouth daily.   atorvastatin (LIPITOR) 80 MG tablet Take 1 tablet (80 mg total) by mouth daily at 6 PM.   furosemide (LASIX) 40 MG tablet Take 1 tablet by mouth twice daily   gabapentin (NEURONTIN) 300 MG capsule TAKE 1 CAPSULE BY MOUTH EVERY DAY AT BEDTIME (Patient taking differently: Take 300 mg by mouth 2 (two) times daily. )   levothyroxine (SYNTHROID, LEVOTHROID) 25 MCG tablet Take 1 tablet (25 mcg total) by mouth daily before breakfast.   lisinopril (PRINIVIL,ZESTRIL) 2.5 MG tablet Take 2.5 mg by mouth daily.   nitroGLYCERIN (NITROSTAT) 0.4 MG SL tablet Place 1 tablet (0.4 mg total) under the tongue every 5 (five) minutes x 3 doses as needed for chest pain.   potassium chloride (K-DUR) 10 MEQ tablet Take 1 tablet by mouth once daily   primidone (MYSOLINE) 50 MG tablet Take 1 tablet (50 mg total) by mouth at bedtime. 1 tab 1 hour before bed, may repeat once during the night when up to the bathroom   warfarin (COUMADIN) 5 MG tablet TAKE AS DIRECTED BY MOUTH BY COUMADIN CLINIC (Patient taking differently: 1/2 tablet sat, sun, tues and thur. And then 1 tablet mon,wed,fri)     Allergies:   Patient has no known allergies.   Social History   Tobacco Use   Smoking status: Never Smoker   Smokeless tobacco: Never Used   Tobacco comment: "smoked when I was 22"  Substance Use Topics   Alcohol use: Yes    Alcohol/week: 7.0 - 14.0  standard drinks    Types: 7 - 14 Cans of beer per week    Comment: occ   Drug use: No     Family Hx: The patient's family history includes Cancer in his father and mother.  ROS:   Please see the history of present illness.      All other systems reviewed and are negative.   Prior CV studies:   The following studies were reviewed today:  Echo 11/2018  IMPRESSIONS      1. The left ventricle has normal systolic function, with an ejection fraction of 55-60%. The cavity size was normal. Left ventricular diastolic Doppler parameters are consistent with pseudonormalization.  2. The right ventricle has normal systolic function. The cavity  was normal. There is no increase in right ventricular wall thickness.  3. Left atrial size was moderately dilated.  4. The mitral valve is normal in structure. Mild thickening of the mitral valve leaflet. Mild calcification of the mitral valve leaflet.  5. The tricuspid valve is normal in structure.  6. The aortic valve has an indeterminant number of cusps Moderate thickening of the aortic valve Moderate calcification of the aortic valve. Aortic valve regurgitation is mild by color flow Doppler. mild stenosis of the aortic valve.  7. The pulmonic valve was grossly normal. Pulmonic valve regurgitation is mild by color flow Doppler.  8. There is mild dilatation of the aortic root measuring 39 mm.   FINDINGS  Left Ventricle: The left ventricle has normal systolic function, with an ejection fraction of 55-60%. The cavity size was normal. There is no increase in left ventricular wall thickness. Left ventricular diastolic Doppler parameters are consistent with  pseudonormalization Right Ventricle: The right ventricle has normal systolic function. The cavity was normal. There is no increase in right ventricular wall thickness. Left Atrium: left atrial size was moderately dilated Right Atrium: right atrial size was normal in size. Right atrial pressure is  estimated at 3 mmHg. Interatrial Septum: No atrial level shunt detected by color flow Doppler. Pericardium: There is no evidence of pericardial effusion. Mitral Valve: The mitral valve is normal in structure. Mild thickening of the mitral valve leaflet. Mild calcification of the mitral valve leaflet. Mitral valve regurgitation is mild by color flow Doppler. Tricuspid Valve: The tricuspid valve is normal in structure. Tricuspid valve regurgitation is mild by color flow Doppler. Aortic Valve: The aortic valve has an indeterminant number of cusps Moderate thickening of the aortic valve Moderate calcification of the aortic valve. Aortic valve regurgitation is mild by color flow Doppler. There is mild stenosis of the aortic valve,  with a calculated valve area of 1.93 cm. Pulmonic Valve: The pulmonic valve was grossly normal. Pulmonic valve regurgitation is mild by color flow Doppler. Aorta: There is mild dilatation of the aortic root measuring 39 mm. Venous: The inferior vena cava is normal in size with greater than 50% respiratory variability. Compared to previous exam: 06/27/17 EF 55-60%.        Labs/Other Tests and Data Reviewed:    EKG:  No ECG reviewed. rhythm strip unreadable  Recent Labs: 12/08/2018: ALT 109 02/25/2019: BUN 17; Creatinine, Ser 1.85; Hemoglobin 10.9; NT-Pro BNP 2,095; Platelets 118; Potassium 4.0; Sodium 139; TSH 9.230   Recent Lipid Panel Lab Results  Component Value Date/Time   CHOL 178 10/01/2017 01:17 PM   TRIG 108 10/01/2017 01:17 PM   HDL 63 10/01/2017 01:17 PM   CHOLHDL 2.8 10/01/2017 01:17 PM   LDLCALC 93 10/01/2017 01:17 PM   LDLDIRECT 191.1 01/04/2011 12:18 PM    Wt Readings from Last 3 Encounters:  03/03/19 175 lb 9.6 oz (79.7 kg)  02/25/19 177 lb 14.4 oz (80.7 kg)  12/08/18 186 lb (84.4 kg)     Objective:    Vital Signs:  BP 117/78    Pulse 78    Ht 5\' 8"  (1.727 m)    Wt 175 lb 9.6 oz (79.7 kg)    BMI 26.70 kg/m    VITAL SIGNS:   reviewed GEN:  no acute distress RESPIRATORY:  normal respiratory effort, symmetric expansion CARDIOVASCULAR:  lower extremity edema noted down from last week but still present  ASSESSMENT & PLAN:    1. Bradycardia I stopped his Toprol and  decreased amiodarone to 100 mg daily.  Home health was unable to do a rhythm strip because he was too shaky. No further HR in 40's and up to 78 today. HH to see today. 2. Acute diastolic CHF Lasix increased for 4 days-swelling down and weight down but still has some edema. Increase lasix 80 mg am 40 pm for 2 days then back to 40 mg bid. Increase K 10 meq bid for 2 days then once daily. F/u with me in 2 weeks. 3. Mechanical falls with lumbar fracture 11/2018, rib fracture 02/12/2019.  Need to monitor closely on Coumadin 4. Chronic atrial fibrillation on amiodarone with significant bradycardia stopped Toprol and decreased amiodarone 5. CAD without angina 6. Hyperlipidemia on lipitor  COVID-19 Education: The signs and symptoms of COVID-19 were discussed with the patient and how to seek care for testing (follow up with PCP or arrange E-visit).  The importance of social distancing was discussed today.  Time:   Today, I have spent 12 minutes with the patient with telehealth technology discussing the above problems.     Medication Adjustments/Labs and Tests Ordered: Current medicines are reviewed at length with the patient today.  Concerns regarding medicines are outlined above.   Tests Ordered: No orders of the defined types were placed in this encounter.   Medication Changes: No orders of the defined types were placed in this encounter.   Follow Up:  Virtual Visit in 2 week(s) Ermalinda Barrios PA-C  Signed, Ermalinda Barrios, PA-C  03/03/2019 12:06 PM    [place]

## 2019-03-02 NOTE — Telephone Encounter (Signed)
Please advise 

## 2019-03-03 ENCOUNTER — Telehealth: Payer: Self-pay

## 2019-03-03 ENCOUNTER — Encounter: Payer: Self-pay | Admitting: Physician Assistant

## 2019-03-03 ENCOUNTER — Other Ambulatory Visit: Payer: Self-pay

## 2019-03-03 ENCOUNTER — Telehealth (INDEPENDENT_AMBULATORY_CARE_PROVIDER_SITE_OTHER): Payer: Medicare Other | Admitting: Physician Assistant

## 2019-03-03 VITALS — BP 117/78 | HR 78 | Ht 68.0 in | Wt 175.6 lb

## 2019-03-03 DIAGNOSIS — E785 Hyperlipidemia, unspecified: Secondary | ICD-10-CM

## 2019-03-03 DIAGNOSIS — Z9181 History of falling: Secondary | ICD-10-CM

## 2019-03-03 DIAGNOSIS — T462X5D Adverse effect of other antidysrhythmic drugs, subsequent encounter: Secondary | ICD-10-CM | POA: Diagnosis not present

## 2019-03-03 DIAGNOSIS — I4819 Other persistent atrial fibrillation: Secondary | ICD-10-CM

## 2019-03-03 DIAGNOSIS — R609 Edema, unspecified: Secondary | ICD-10-CM | POA: Diagnosis not present

## 2019-03-03 DIAGNOSIS — I2510 Atherosclerotic heart disease of native coronary artery without angina pectoris: Secondary | ICD-10-CM

## 2019-03-03 DIAGNOSIS — S2232XD Fracture of one rib, left side, subsequent encounter for fracture with routine healing: Secondary | ICD-10-CM | POA: Diagnosis not present

## 2019-03-03 DIAGNOSIS — I5032 Chronic diastolic (congestive) heart failure: Secondary | ICD-10-CM

## 2019-03-03 DIAGNOSIS — G620 Drug-induced polyneuropathy: Secondary | ICD-10-CM | POA: Diagnosis not present

## 2019-03-03 DIAGNOSIS — Z7901 Long term (current) use of anticoagulants: Secondary | ICD-10-CM

## 2019-03-03 DIAGNOSIS — I5031 Acute diastolic (congestive) heart failure: Secondary | ICD-10-CM | POA: Diagnosis not present

## 2019-03-03 DIAGNOSIS — S32030D Wedge compression fracture of third lumbar vertebra, subsequent encounter for fracture with routine healing: Secondary | ICD-10-CM | POA: Diagnosis not present

## 2019-03-03 DIAGNOSIS — I255 Ischemic cardiomyopathy: Secondary | ICD-10-CM | POA: Diagnosis not present

## 2019-03-03 DIAGNOSIS — R001 Bradycardia, unspecified: Secondary | ICD-10-CM

## 2019-03-03 DIAGNOSIS — W19XXXD Unspecified fall, subsequent encounter: Secondary | ICD-10-CM | POA: Diagnosis not present

## 2019-03-03 NOTE — Telephone Encounter (Signed)
[facility] Visit Follow Up Request   Date of Request ([place]):  March 03, 2019  Requesting Provider:  Ermalinda Barrios    Agency Requested:    [facility] Contact:  Glory Buff, NP [address] [place], [place] [address] Phone #:  [phone] Fax #:  [phone]  Patient Demographic Information: Name:  Ricardo Valencia Age:  85 y.o.   DOB:  12/31/1928  MRN:  [id number]   Home visit progress note(s), lab results, telemetry strips, etc were reviewed.  Provider Recommendations: Please draw INR on 03/19/19  Follow up home services requested:  Labs:  INR  All labs ordered for this home visit have been released and the request was sent to Chrissie Noa at [facility].

## 2019-03-03 NOTE — Telephone Encounter (Signed)
Called Liij with [facility] and gave her the verbal OK per Dr. [name]. Liij verbalized an understanding and thanked me.

## 2019-03-03 NOTE — Patient Instructions (Signed)
Medication Instructions:  Your physician has recommended you make the following change in your medication:   1. furosemide (lasix) 40 mg tablet: Take 2 tablets (80 mg total) in the morning and 1 tablet (40 mg total) in the afternoon for 3 days only, then go back to taking 1 tablet (40 mg total) twice a day  2. potassium (k-dur) 10 mEq tablet: Take 1 tablet (10 mEq total) twice a day for 3 days only, then go back to taking 1 tablet (10 mEq total) once a day   Lab work: [facility] to come out today to draw BMET and BNP  We will arrange for Home Health to draw INR on July 2nd  If you have labs (blood work) drawn today and your tests are completely normal, you will receive your results only by: Marland Kitchen MyChart Message (if you have MyChart) OR . A paper copy in the mail If you have any lab test that is abnormal or we need to change your treatment, we will call you to review the results.  Testing/Procedures: None ordered  Follow-Up: . Follow up with Ermalinda Barrios, PA via TELEPHONE Visit on 03/18/19 at 12:30 PM  Any Other Special Instructions Will Be Listed Below (If Applicable).  Wear compression stockings

## 2019-03-03 NOTE — Telephone Encounter (Signed)
OK to give verbal orders as requested.

## 2019-03-03 NOTE — Progress Notes (Signed)
Home Visit on behalf of [facility] to do physical exam, med rec, BMET, BNP.  On arrival, Ricardo Valencia stated he was feeling great.  He had just had a virtual visit w/[name], PA w/[facility].  Wife, Ricardo Valencia, stated the med rec was reviewed during that call and she had no questions or concerns about his medication.  He was to increase his lasix to 80mg  @ noon and 40mg  @ pm x 3 days as well as KDUR, then resume previous orders.  He stated his BLE edema has improved from where they had been.  Other than the RT ear "neurothopic shingle pain" for approx 1 month and back pain/LT flank pain d/t previous fall approx 5-6 months ago, he had no concerns or complaints.  His HHPT was due to arrive for PT shortly after this visit.   BMET and BNP were drawn and brought to the [facility] on [address].

## 2019-03-03 NOTE — Patient Outreach (Signed)
[facility]) Care Management  03/03/2019  Markee Matera Pippen July 18, 1929 [id number]   Telephone call to wife [name].  She states that she cannot talk right now as patient has virtual visit with doctor right now.  Advised that CM would call another time.  She verbalized understanding and agrees to call another time.    Plan: RN CM will outreach again within 4 business days.    Jone Baseman, RN, MSN [place] Management Care Management Coordinator Direct Line [phone] Cell [phone] Toll Free: [phone]  Fax: [phone]

## 2019-03-04 ENCOUNTER — Telehealth: Payer: Self-pay | Admitting: Family Medicine

## 2019-03-04 ENCOUNTER — Other Ambulatory Visit: Payer: Self-pay

## 2019-03-04 ENCOUNTER — Other Ambulatory Visit: Payer: Self-pay | Admitting: Family Medicine

## 2019-03-04 DIAGNOSIS — S2232XD Fracture of one rib, left side, subsequent encounter for fracture with routine healing: Secondary | ICD-10-CM | POA: Diagnosis not present

## 2019-03-04 DIAGNOSIS — W19XXXD Unspecified fall, subsequent encounter: Secondary | ICD-10-CM | POA: Diagnosis not present

## 2019-03-04 DIAGNOSIS — I4819 Other persistent atrial fibrillation: Secondary | ICD-10-CM

## 2019-03-04 DIAGNOSIS — S32030D Wedge compression fracture of third lumbar vertebra, subsequent encounter for fracture with routine healing: Secondary | ICD-10-CM | POA: Diagnosis not present

## 2019-03-04 DIAGNOSIS — G620 Drug-induced polyneuropathy: Secondary | ICD-10-CM | POA: Diagnosis not present

## 2019-03-04 DIAGNOSIS — I255 Ischemic cardiomyopathy: Secondary | ICD-10-CM | POA: Diagnosis not present

## 2019-03-04 DIAGNOSIS — Z7901 Long term (current) use of anticoagulants: Secondary | ICD-10-CM

## 2019-03-04 DIAGNOSIS — T462X5D Adverse effect of other antidysrhythmic drugs, subsequent encounter: Secondary | ICD-10-CM | POA: Diagnosis not present

## 2019-03-04 LAB — BASIC METABOLIC PANEL
BUN/Creatinine Ratio: 7 — ABNORMAL LOW (ref 10–24)
BUN: 14 mg/dL (ref 8–27)
CO2: 26 mmol/L (ref 20–29)
Calcium: 7.8 mg/dL — ABNORMAL LOW (ref 8.6–10.2)
Chloride: 100 mmol/L (ref 96–106)
Creatinine, Ser: 1.94 mg/dL — ABNORMAL HIGH (ref 0.76–1.27)
GFR calc Af Amer: 35 mL/min/{1.73_m2} — ABNORMAL LOW (ref >59)
GFR calc non Af Amer: 30 mL/min/{1.73_m2} — ABNORMAL LOW (ref >59)
Glucose: 114 mg/dL — ABNORMAL HIGH (ref 65–99)
Potassium: 4.1 mmol/L (ref 3.5–5.2)
Sodium: 137 mmol/L (ref 134–144)

## 2019-03-04 LAB — PRO B NATRIURETIC PEPTIDE: NT-Pro BNP: 2252 pg/mL — ABNORMAL HIGH (ref 0–486)

## 2019-03-04 NOTE — Telephone Encounter (Signed)
Please see message.  Please advise. 

## 2019-03-04 NOTE — Telephone Encounter (Signed)
OK 

## 2019-03-04 NOTE — Telephone Encounter (Signed)
Ricardo Valencia and gave her the verbal OK for orders per Dr. [name].

## 2019-03-04 NOTE — Telephone Encounter (Signed)
[name] Is being overwhelm b/c of the visits from several nurses. Sharyn Lull is making an adjustment for, OT pt is being put on hold until July 6 and after 1x w 5wks. Please give verbal orders.  .orders

## 2019-03-05 ENCOUNTER — Other Ambulatory Visit: Payer: Self-pay

## 2019-03-05 DIAGNOSIS — T462X5D Adverse effect of other antidysrhythmic drugs, subsequent encounter: Secondary | ICD-10-CM | POA: Diagnosis not present

## 2019-03-05 DIAGNOSIS — I255 Ischemic cardiomyopathy: Secondary | ICD-10-CM | POA: Diagnosis not present

## 2019-03-05 DIAGNOSIS — S2232XD Fracture of one rib, left side, subsequent encounter for fracture with routine healing: Secondary | ICD-10-CM | POA: Diagnosis not present

## 2019-03-05 DIAGNOSIS — S32030D Wedge compression fracture of third lumbar vertebra, subsequent encounter for fracture with routine healing: Secondary | ICD-10-CM | POA: Diagnosis not present

## 2019-03-05 DIAGNOSIS — W19XXXD Unspecified fall, subsequent encounter: Secondary | ICD-10-CM | POA: Diagnosis not present

## 2019-03-05 DIAGNOSIS — G620 Drug-induced polyneuropathy: Secondary | ICD-10-CM | POA: Diagnosis not present

## 2019-03-05 NOTE — Patient Outreach (Signed)
[facility]) Care Management  03/05/2019  Ricardo Valencia 03/04/1929 [id number]   Telephone call to wife for weekly call. Wife states this is not a good time and asked for a call back.  Advised CM would call back another time.    Plan: RN CM will attempt again within 4 business days.    Jone Baseman, RN, MSN [place] Management Care Management Coordinator Direct Line [phone] Cell [phone] Toll Free: [phone]  Fax: [phone]

## 2019-03-06 ENCOUNTER — Telehealth: Payer: Self-pay | Admitting: Physician Assistant

## 2019-03-06 DIAGNOSIS — I255 Ischemic cardiomyopathy: Secondary | ICD-10-CM | POA: Diagnosis not present

## 2019-03-06 DIAGNOSIS — S32030D Wedge compression fracture of third lumbar vertebra, subsequent encounter for fracture with routine healing: Secondary | ICD-10-CM | POA: Diagnosis not present

## 2019-03-06 DIAGNOSIS — I5032 Chronic diastolic (congestive) heart failure: Secondary | ICD-10-CM

## 2019-03-06 DIAGNOSIS — W19XXXD Unspecified fall, subsequent encounter: Secondary | ICD-10-CM | POA: Diagnosis not present

## 2019-03-06 DIAGNOSIS — S2232XD Fracture of one rib, left side, subsequent encounter for fracture with routine healing: Secondary | ICD-10-CM | POA: Diagnosis not present

## 2019-03-06 DIAGNOSIS — T462X5D Adverse effect of other antidysrhythmic drugs, subsequent encounter: Secondary | ICD-10-CM | POA: Diagnosis not present

## 2019-03-06 DIAGNOSIS — G620 Drug-induced polyneuropathy: Secondary | ICD-10-CM | POA: Diagnosis not present

## 2019-03-06 NOTE — Telephone Encounter (Signed)
New Message   Patients wife is returning call in reference to lab results. Please call.

## 2019-03-09 ENCOUNTER — Telehealth: Payer: Self-pay

## 2019-03-09 DIAGNOSIS — I5032 Chronic diastolic (congestive) heart failure: Secondary | ICD-10-CM

## 2019-03-09 MED ORDER — CALCIUM CARBONATE ANTACID 500 MG PO CHEW: 1.0000 | CHEWABLE_TABLET | Freq: Every day | ORAL | 3 refills | Status: DC

## 2019-03-09 NOTE — Telephone Encounter (Signed)
Left message for patient to call back  

## 2019-03-09 NOTE — Telephone Encounter (Signed)
Ricardo Valencia, returned your call.

## 2019-03-09 NOTE — Telephone Encounter (Signed)
Called and made patient's wife (DPR on file) aware of lab results and recommendations. She states that the patient will take TUMS. Made her aware that I will arrange for [facility] to come out and check another BMET and BNP in 1 week. She verbalized understanding and thanked me for the call.

## 2019-03-09 NOTE — Telephone Encounter (Signed)
[facility] Visit Follow Up Request   Date of Request ([place]):  March 09, 2019  Requesting Provider:  Ermalinda Barrios, PA    Agency Requested:    [facility] Contact:  Glory Buff, NP [address] [place], [place] [address] Phone #:  [phone] Fax #:  [phone]  Patient Demographic Information: Name:  Ricardo Valencia Age:  85 y.o.   DOB:  Aug 14, 1929  MRN:  [id number]   Home visit progress note(s), lab results, telemetry strips, etc were reviewed.  Provider Recommendations: BNP still elevated but we already increased his lasix. Kidney function up as well. Calcium low. Would start calcium 500 mg once daily-can just take a TUMS if he wants. Have HH draw BMET and BNP next week. thanks  Follow up home services requested:  Labs:  BMET and BNP in 1 week

## 2019-03-09 NOTE — Telephone Encounter (Signed)
-----   Message from Imogene Burn, PA-C sent at 03/04/2019  8:00 AM EDT ----- BNP still elevated but we already increased his lasix. Kidney function up as well. Calcium low. Would start calcium 500 mg once daily-can just take a TUMS if he wants. Have HH draw BMET and BNP next week. thanks

## 2019-03-10 ENCOUNTER — Other Ambulatory Visit: Payer: Self-pay

## 2019-03-10 DIAGNOSIS — G620 Drug-induced polyneuropathy: Secondary | ICD-10-CM | POA: Diagnosis not present

## 2019-03-10 DIAGNOSIS — S2232XD Fracture of one rib, left side, subsequent encounter for fracture with routine healing: Secondary | ICD-10-CM | POA: Diagnosis not present

## 2019-03-10 DIAGNOSIS — I255 Ischemic cardiomyopathy: Secondary | ICD-10-CM | POA: Diagnosis not present

## 2019-03-10 DIAGNOSIS — W19XXXD Unspecified fall, subsequent encounter: Secondary | ICD-10-CM | POA: Diagnosis not present

## 2019-03-10 DIAGNOSIS — S32030D Wedge compression fracture of third lumbar vertebra, subsequent encounter for fracture with routine healing: Secondary | ICD-10-CM | POA: Diagnosis not present

## 2019-03-10 DIAGNOSIS — T462X5D Adverse effect of other antidysrhythmic drugs, subsequent encounter: Secondary | ICD-10-CM | POA: Diagnosis not present

## 2019-03-10 NOTE — Patient Outreach (Signed)
Ricardo Valencia) Care Management  03/10/2019  Ricardo Valencia [birth year] [id number]   Telephone call to wife for weekly call.  She is able to verify HIPAA.  She states that patient is getting stronger with PT working with him 2/week.  She states also that the home health nurse is coming 2/week as well.  She states that patient swelling is down and that his weight is coming down as well.  Last weight was 173.4 lbs today.  She states that he is weighing daily.  She states that his heart rate is better in the 50-60's.  Patient has had no falls.  Discussed keeping up with exercises and weighing daily.  She verbalized understanding. She denies any questions or concerns.   Plan: RN CM will outreach next week and wife agrees to next outreach.    Jone Baseman, RN, MSN [place] Management Care Management Coordinator Direct Line [phone] Cell [phone] Toll Free: [phone]  Fax: [phone]

## 2019-03-12 ENCOUNTER — Telehealth: Payer: Self-pay | Admitting: Family Medicine

## 2019-03-12 DIAGNOSIS — G620 Drug-induced polyneuropathy: Secondary | ICD-10-CM | POA: Diagnosis not present

## 2019-03-12 DIAGNOSIS — T462X5D Adverse effect of other antidysrhythmic drugs, subsequent encounter: Secondary | ICD-10-CM | POA: Diagnosis not present

## 2019-03-12 DIAGNOSIS — S2232XD Fracture of one rib, left side, subsequent encounter for fracture with routine healing: Secondary | ICD-10-CM | POA: Diagnosis not present

## 2019-03-12 DIAGNOSIS — W19XXXD Unspecified fall, subsequent encounter: Secondary | ICD-10-CM | POA: Diagnosis not present

## 2019-03-12 DIAGNOSIS — I255 Ischemic cardiomyopathy: Secondary | ICD-10-CM | POA: Diagnosis not present

## 2019-03-12 DIAGNOSIS — S32030D Wedge compression fracture of third lumbar vertebra, subsequent encounter for fracture with routine healing: Secondary | ICD-10-CM | POA: Diagnosis not present

## 2019-03-12 NOTE — Telephone Encounter (Signed)
Home Health Verbal Orders - Caller/Agency: Caryl Pina with [facility] Number: [phone], OK to leave a message Requesting OT/PT/Skilled Nursing/Social Work/Speech Therapy: skilled nursing Frequency: Pt usually gets two visits but only wanted one this week because they had a busy week and only want one visit this week only.  Need change of order for this week only for one visit and will continue forward with order that is on file for two visits a week.

## 2019-03-13 DIAGNOSIS — S32030D Wedge compression fracture of third lumbar vertebra, subsequent encounter for fracture with routine healing: Secondary | ICD-10-CM | POA: Diagnosis not present

## 2019-03-13 DIAGNOSIS — T462X5D Adverse effect of other antidysrhythmic drugs, subsequent encounter: Secondary | ICD-10-CM | POA: Diagnosis not present

## 2019-03-13 DIAGNOSIS — S2232XD Fracture of one rib, left side, subsequent encounter for fracture with routine healing: Secondary | ICD-10-CM | POA: Diagnosis not present

## 2019-03-13 DIAGNOSIS — W19XXXD Unspecified fall, subsequent encounter: Secondary | ICD-10-CM | POA: Diagnosis not present

## 2019-03-13 DIAGNOSIS — I255 Ischemic cardiomyopathy: Secondary | ICD-10-CM | POA: Diagnosis not present

## 2019-03-13 DIAGNOSIS — G620 Drug-induced polyneuropathy: Secondary | ICD-10-CM | POA: Diagnosis not present

## 2019-03-13 NOTE — Telephone Encounter (Signed)
Called and spoke with Ricardo Valencia. Informed [name] per Dr. [name] it is ok to proceed with the orders as requested. Ricardo Valencia verbalized understanding.

## 2019-03-13 NOTE — Telephone Encounter (Signed)
OK to proceed with orders as requested.   

## 2019-03-17 ENCOUNTER — Ambulatory Visit: Payer: Medicare Other | Admitting: Family Medicine

## 2019-03-17 ENCOUNTER — Telehealth: Payer: Self-pay | Admitting: Physician Assistant

## 2019-03-17 ENCOUNTER — Other Ambulatory Visit: Payer: Self-pay

## 2019-03-17 DIAGNOSIS — T462X5D Adverse effect of other antidysrhythmic drugs, subsequent encounter: Secondary | ICD-10-CM | POA: Diagnosis not present

## 2019-03-17 DIAGNOSIS — W19XXXD Unspecified fall, subsequent encounter: Secondary | ICD-10-CM | POA: Diagnosis not present

## 2019-03-17 DIAGNOSIS — G620 Drug-induced polyneuropathy: Secondary | ICD-10-CM | POA: Diagnosis not present

## 2019-03-17 DIAGNOSIS — I255 Ischemic cardiomyopathy: Secondary | ICD-10-CM | POA: Diagnosis not present

## 2019-03-17 DIAGNOSIS — S32030D Wedge compression fracture of third lumbar vertebra, subsequent encounter for fracture with routine healing: Secondary | ICD-10-CM | POA: Diagnosis not present

## 2019-03-17 DIAGNOSIS — S2232XD Fracture of one rib, left side, subsequent encounter for fracture with routine healing: Secondary | ICD-10-CM | POA: Diagnosis not present

## 2019-03-17 NOTE — Telephone Encounter (Signed)
New Message     Confirmed appt  Consent in Epic

## 2019-03-17 NOTE — Patient Outreach (Signed)
[facility]) Care Management  03/17/2019  Wing Schoch Graves 06-04-1929 [id number]   Telephone outreach to patient. He is able to verify HIPAA. He states that he is doing good.  He states that the therapist is to visit today.  He states the nurse is to come tomorrow. Patient states that he has no swelling and uses cane and walker for ambulation.  He states that he deals with shingles pain regularly and takes medication to control that.  He states that his wife keeps up with things concerning his health and overall feels better.  Discussed with falls and he states that he continues to train using the cane and the walker.  Patient denies any questions or concerns at this time.    Plan: RN CM will outreach patient next week and patient agrees to next outreach.    Jone Baseman, RN, MSN [place] Management Care Management Coordinator Direct Line [phone] Cell [phone] Toll Free: [phone]  Fax: [phone]

## 2019-03-18 ENCOUNTER — Encounter: Payer: Self-pay | Admitting: Physician Assistant

## 2019-03-18 ENCOUNTER — Other Ambulatory Visit: Payer: Self-pay

## 2019-03-18 ENCOUNTER — Telehealth (INDEPENDENT_AMBULATORY_CARE_PROVIDER_SITE_OTHER): Payer: Medicare Other | Admitting: Physician Assistant

## 2019-03-18 VITALS — BP 117/62 | Ht 69.0 in | Wt 172.5 lb

## 2019-03-18 DIAGNOSIS — R001 Bradycardia, unspecified: Secondary | ICD-10-CM | POA: Diagnosis not present

## 2019-03-18 DIAGNOSIS — E785 Hyperlipidemia, unspecified: Secondary | ICD-10-CM

## 2019-03-18 DIAGNOSIS — I4819 Other persistent atrial fibrillation: Secondary | ICD-10-CM | POA: Diagnosis not present

## 2019-03-18 DIAGNOSIS — I4811 Longstanding persistent atrial fibrillation: Secondary | ICD-10-CM

## 2019-03-18 DIAGNOSIS — I5033 Acute on chronic diastolic (congestive) heart failure: Secondary | ICD-10-CM

## 2019-03-18 DIAGNOSIS — I25810 Atherosclerosis of coronary artery bypass graft(s) without angina pectoris: Secondary | ICD-10-CM

## 2019-03-18 DIAGNOSIS — Z9181 History of falling: Secondary | ICD-10-CM

## 2019-03-18 DIAGNOSIS — Z7901 Long term (current) use of anticoagulants: Secondary | ICD-10-CM | POA: Diagnosis not present

## 2019-03-18 LAB — PROTIME-INR
INR: 2.1 — ABNORMAL HIGH (ref 0.8–1.2)
Prothrombin Time: 21.1 s — ABNORMAL HIGH (ref 9.1–12.0)

## 2019-03-18 NOTE — Patient Instructions (Signed)
.  Medication Instructions:  Your physician recommends that you continue on your current medications as directed. Please refer to the Current Medication list given to you today.  If you need a refill on your cardiac medications before your next appointment, please call your pharmacy.   Lab work:  None ordered today  Testing/Procedures:  None ordered today  Follow-Up:  04/15/19 at 12:30 with Ermalinda Barrios, PA

## 2019-03-18 NOTE — Progress Notes (Signed)
Virtual Visit via Video Note   This visit type was conducted due to national recommendations for restrictions regarding the COVID-19 Pandemic (e.g. social distancing) in an effort to limit this patient's exposure and mitigate transmission in our community.  Due to his co-morbid illnesses, this patient is at least at moderate risk for complications without adequate follow up.  This format is felt to be most appropriate for this patient at this time.  All issues noted in this document were discussed and addressed.  A limited physical exam was performed with this format.  Please refer to the patient's chart for his consent to telehealth for [facility].   Date:  03/18/2019   ID:  Ricardo Valencia, DOB [birth year], MRN [id number]  Patient Location: Home Provider Location: Home  PCP:  Eulas Post, MD  Cardiologist:  Lauree Chandler, MD  Electrophysiologist:  None   Evaluation Performed:  Follow-Up Visit  Chief Complaint:  F/u edema  History of Present Illness:    Ricardo Valencia is a 85 y.o. male with with persistent atrial fibrillation, CAD status post DES to the mid LAD 2016, echo at that time LVEF 25 to 30% improved to 45 to 50% atrial fibrillation 2016 cardioverted back to normal sinus rhythm.  Patient had recurrent atrial fibrillation and was on Tikosyn but stopped because of prolonged QT interval and now is on amiodarone. Last echo 06/2017 EF 55 to 60% with basal inferior akinesis mildly dilated RV and mildly decreased RV systolic function.   Patient was last seen by Dr. Aundra Dubin in the heart failure clinic 11/2017 and was doing well.  No aspirin given stable CAD and Coumadin use.  He suspected OSA but the patient did not want to do a sleep study.   I had an office visit with the patient 11/05/2018 at which time his heart rate was in the 40s and had been slow for about 6 months.  I decreased his Toprol to 25 mg once daily.  He was also getting extra salt in his diet I gave  him an extra Lasix for the 1 day.  Echo showed normal LVEF 55 to 60% with some diastolic dysfunction and mild aortic stenosis   I saw the patient with a telemedicine visit 02/25/2019 because of his wife complaining of multiple falls and slow pulse.  As well as low blood pressure.  He did have a fall 12/08/2018 and had a L3 lumbar compression fracture.  Another fall 02/12/2019 when he missed a step trying to put on his pants and fell hitting a dresser striking his left lateral rib prompting ER visit.  He had minimally displaced left eighth rib fracture.  He was bradycardic and I stopped his Toprol and decreased his amiodarone to 100 mg a day.  BNP 2252 and lasix increased for a couple days. Crt up to 1.94.  Patient complains of urinating a lot and wants to decrease lasix -taking it around 2 pm and 9pm. Still mild swelling in ankles but better. BP and pulse doing well. Weight down 3 lbs. I told patient to take lasix 9 am 2 pm  The patient does not have symptoms concerning for COVID-19 infection (fever, chills, cough, or new shortness of breath).    Past Medical History:  Diagnosis Date   CAD (coronary artery disease)    DES to mid LAD 03/16/5283   Chronic systolic heart failure (HCC)    Hay fever    hay fever, allergies   History of echocardiogram  Echo 3/17: EF 40%, diff HK, trivial AI, trivial MR, mod LAE, mild reduced RVSF, mod RAE   Hyperlipidemia    Hypertension    Persistent atrial fibrillation    Past Surgical History:  Procedure Laterality Date   APPENDECTOMY  1947   CARDIOVERSION N/A 12/30/2014   Procedure: CARDIOVERSION;  Surgeon: Jerline Pain, MD;  Location: [place];  Service: Cardiovascular;  Laterality: N/A;   CORONARY ANGIOPLASTY WITH STENT PLACEMENT     LEFT HEART CATHETERIZATION WITH CORONARY ANGIOGRAM N/A 11/10/2014   Procedure: LEFT HEART CATHETERIZATION WITH CORONARY ANGIOGRAM;  Surgeon: Wellington Hampshire, MD;  Location: [place] CATH LAB;  Service:  Cardiovascular;  Laterality: N/A;   PERCUTANEOUS CORONARY STENT INTERVENTION (PCI-S)  11/10/2014   Procedure: PERCUTANEOUS CORONARY STENT INTERVENTION (PCI-S);  Surgeon: Wellington Hampshire, MD;  Location: [facility] CATH LAB;  Service: Cardiovascular;;     Current Meds  Medication Sig   amiodarone (PACERONE) 200 MG tablet Take 0.5 tablets (100 mg total) by mouth daily.   Ascorbic Acid (VITAMIN C) 1000 MG tablet Take 1,000 mg by mouth daily.   atorvastatin (LIPITOR) 80 MG tablet Take 1 tablet (80 mg total) by mouth daily at 6 PM.   calcium carbonate (TUMS) 500 MG chewable tablet Chew 1 tablet (200 mg of elemental calcium total) by mouth daily.   furosemide (LASIX) 40 MG tablet Take 1 tablet by mouth twice daily   gabapentin (NEURONTIN) 300 MG capsule Take 300 mg by mouth 2 (two) times daily.   levothyroxine (SYNTHROID, LEVOTHROID) 25 MCG tablet Take 1 tablet (25 mcg total) by mouth daily before breakfast.   lisinopril (PRINIVIL,ZESTRIL) 2.5 MG tablet Take 2.5 mg by mouth daily.   nitroGLYCERIN (NITROSTAT) 0.4 MG SL tablet Place 1 tablet (0.4 mg total) under the tongue every 5 (five) minutes x 3 doses as needed for chest pain.   potassium chloride (K-DUR) 10 MEQ tablet Take 1 tablet by mouth once daily   primidone (MYSOLINE) 50 MG tablet Take 1 tablet (50 mg total) by mouth at bedtime. 1 tab 1 hour before bed, may repeat once during the night when up to the bathroom   warfarin (COUMADIN) 5 MG tablet TAKE AS DIRECTED BY MOUTH BY COUMADIN CLINIC (Patient taking differently: 1/2 tablet sat, sun, tues and thur. And then 1 tablet mon,wed,fri)     Allergies:   Patient has no known allergies.   Social History   Tobacco Use   Smoking status: Never Smoker   Smokeless tobacco: Never Used   Tobacco comment: "smoked when I was 22"  Substance Use Topics   Alcohol use: Yes    Alcohol/week: 7.0 - 14.0 standard drinks    Types: 7 - 14 Cans of beer per week    Comment: occ   Drug use: No      Family Hx: The patient's family history includes Cancer in his father and mother.  ROS:   Please see the history of present illness.     All other systems reviewed and are negative.   Prior CV studies:   The following studies were reviewed today:  Echo 11/2018  IMPRESSIONS      1. The left ventricle has normal systolic function, with an ejection fraction of 55-60%. The cavity size was normal. Left ventricular diastolic Doppler parameters are consistent with pseudonormalization.  2. The right ventricle has normal systolic function. The cavity was normal. There is no increase in right ventricular wall thickness.  3. Left atrial size was moderately dilated.  4. The mitral valve is normal in structure. Mild thickening of the mitral valve leaflet. Mild calcification of the mitral valve leaflet.  5. The tricuspid valve is normal in structure.  6. The aortic valve has an indeterminant number of cusps Moderate thickening of the aortic valve Moderate calcification of the aortic valve. Aortic valve regurgitation is mild by color flow Doppler. mild stenosis of the aortic valve.  7. The pulmonic valve was grossly normal. Pulmonic valve regurgitation is mild by color flow Doppler.  8. There is mild dilatation of the aortic root measuring 39 mm.   FINDINGS  Left Ventricle: The left ventricle has normal systolic function, with an ejection fraction of 55-60%. The cavity size was normal. There is no increase in left ventricular wall thickness. Left ventricular diastolic Doppler parameters are consistent with  pseudonormalization Right Ventricle: The right ventricle has normal systolic function. The cavity was normal. There is no increase in right ventricular wall thickness. Left Atrium: left atrial size was moderately dilated Right Atrium: right atrial size was normal in size. Right atrial pressure is estimated at 3 mmHg. Interatrial Septum: No atrial level shunt detected by color flow  Doppler. Pericardium: There is no evidence of pericardial effusion. Mitral Valve: The mitral valve is normal in structure. Mild thickening of the mitral valve leaflet. Mild calcification of the mitral valve leaflet. Mitral valve regurgitation is mild by color flow Doppler. Tricuspid Valve: The tricuspid valve is normal in structure. Tricuspid valve regurgitation is mild by color flow Doppler. Aortic Valve: The aortic valve has an indeterminant number of cusps Moderate thickening of the aortic valve Moderate calcification of the aortic valve. Aortic valve regurgitation is mild by color flow Doppler. There is mild stenosis of the aortic valve,  with a calculated valve area of 1.93 cm. Pulmonic Valve: The pulmonic valve was grossly normal. Pulmonic valve regurgitation is mild by color flow Doppler. Aorta: There is mild dilatation of the aortic root measuring 39 mm. Venous: The inferior vena cava is normal in size with greater than 50% respiratory variability. Compared to previous exam: 06/27/17 EF 55-60%.       Labs/Other Tests and Data Reviewed:    EKG:  No ECG reviewed.  Recent Labs: 12/08/2018: ALT 109 02/25/2019: Hemoglobin 10.9; Platelets 118; TSH 9.230 03/03/2019: BUN 14; Creatinine, Ser 1.94; NT-Pro BNP 2,252; Potassium 4.1; Sodium 137   Recent Lipid Panel Lab Results  Component Value Date/Time   CHOL 178 10/01/2017 01:17 PM   TRIG 108 10/01/2017 01:17 PM   HDL 63 10/01/2017 01:17 PM   CHOLHDL 2.8 10/01/2017 01:17 PM   LDLCALC 93 10/01/2017 01:17 PM   LDLDIRECT 191.1 01/04/2011 12:18 PM    Wt Readings from Last 3 Encounters:  03/18/19 172 lb 8 oz (78.2 kg)  03/03/19 175 lb 9.6 oz (79.7 kg)  03/03/19 175 lb 9.6 oz (79.7 kg)     Objective:    Vital Signs:  BP 117/62    Ht 5\' 9"  (1.753 m)    Wt 172 lb 8 oz (78.2 kg)    BMI 25.47 kg/m    VITAL SIGNS:  reviewed GEN:  no acute distress RESPIRATORY:  normal respiratory effort, symmetric expansion CARDIOVASCULAR:  lower  extremity edema noted-mild and much better than before  ASSESSMENT & PLAN:    1. Bradycardia I stopped his Toprol and decreased amiodarone to 100 mg daily.  Home health was unable to do a rhythm strip because he was too shaky. HR has come up nicely to the  60's and BP stable. 2. Acute diastolic CHF Iincreased lasix 80 mg am 40 pm for 2 days then back to 40 mg bid. Edema much better. HH saw this am and will await their assessment and labs 3. Mechanical falls with lumbar fracture 11/2018, rib fracture 02/12/2019.  Need to monitor closely on Coumadin 4. Chronic atrial fibrillation on amiodarone with significant bradycardia stopped Toprol and decreased amiodarone 5. CAD without angina 6. Hyperlipidemia on lipitor     COVID-19 Education: The signs and symptoms of COVID-19 were discussed with the patient and how to seek care for testing (follow up with PCP or arrange E-visit).   The importance of social distancing was discussed today.  Time:   Today, I have spent 11 minutes with the patient with telehealth technology discussing the above problems.     Medication Adjustments/Labs and Tests Ordered: Current medicines are reviewed at length with the patient today.  Concerns regarding medicines are outlined above.   Tests Ordered: No orders of the defined types were placed in this encounter.   Medication Changes: No orders of the defined types were placed in this encounter.   Follow Up:  Virtual Visit in 4 week(s) Ermalinda Barrios PA-C  Signed, Ermalinda Barrios, PA-C  03/18/2019 1:12 PM    [facility]

## 2019-03-19 DIAGNOSIS — G620 Drug-induced polyneuropathy: Secondary | ICD-10-CM | POA: Diagnosis not present

## 2019-03-19 DIAGNOSIS — W19XXXD Unspecified fall, subsequent encounter: Secondary | ICD-10-CM | POA: Diagnosis not present

## 2019-03-19 DIAGNOSIS — S32030D Wedge compression fracture of third lumbar vertebra, subsequent encounter for fracture with routine healing: Secondary | ICD-10-CM | POA: Diagnosis not present

## 2019-03-19 DIAGNOSIS — I255 Ischemic cardiomyopathy: Secondary | ICD-10-CM | POA: Diagnosis not present

## 2019-03-19 DIAGNOSIS — T462X5D Adverse effect of other antidysrhythmic drugs, subsequent encounter: Secondary | ICD-10-CM | POA: Diagnosis not present

## 2019-03-19 DIAGNOSIS — S2232XD Fracture of one rib, left side, subsequent encounter for fracture with routine healing: Secondary | ICD-10-CM | POA: Diagnosis not present

## 2019-03-22 DIAGNOSIS — I255 Ischemic cardiomyopathy: Secondary | ICD-10-CM | POA: Diagnosis not present

## 2019-03-22 DIAGNOSIS — B0229 Other postherpetic nervous system involvement: Secondary | ICD-10-CM | POA: Diagnosis not present

## 2019-03-22 DIAGNOSIS — I252 Old myocardial infarction: Secondary | ICD-10-CM | POA: Diagnosis not present

## 2019-03-22 DIAGNOSIS — E785 Hyperlipidemia, unspecified: Secondary | ICD-10-CM | POA: Diagnosis not present

## 2019-03-22 DIAGNOSIS — I5022 Chronic systolic (congestive) heart failure: Secondary | ICD-10-CM | POA: Diagnosis not present

## 2019-03-22 DIAGNOSIS — G620 Drug-induced polyneuropathy: Secondary | ICD-10-CM | POA: Diagnosis not present

## 2019-03-22 DIAGNOSIS — I110 Hypertensive heart disease with heart failure: Secondary | ICD-10-CM | POA: Diagnosis not present

## 2019-03-22 DIAGNOSIS — W19XXXD Unspecified fall, subsequent encounter: Secondary | ICD-10-CM | POA: Diagnosis not present

## 2019-03-22 DIAGNOSIS — I4819 Other persistent atrial fibrillation: Secondary | ICD-10-CM | POA: Diagnosis not present

## 2019-03-22 DIAGNOSIS — T462X5D Adverse effect of other antidysrhythmic drugs, subsequent encounter: Secondary | ICD-10-CM | POA: Diagnosis not present

## 2019-03-22 DIAGNOSIS — Z7901 Long term (current) use of anticoagulants: Secondary | ICD-10-CM | POA: Diagnosis not present

## 2019-03-22 DIAGNOSIS — I2510 Atherosclerotic heart disease of native coronary artery without angina pectoris: Secondary | ICD-10-CM | POA: Diagnosis not present

## 2019-03-22 DIAGNOSIS — S32030D Wedge compression fracture of third lumbar vertebra, subsequent encounter for fracture with routine healing: Secondary | ICD-10-CM | POA: Diagnosis not present

## 2019-03-22 DIAGNOSIS — Z955 Presence of coronary angioplasty implant and graft: Secondary | ICD-10-CM | POA: Diagnosis not present

## 2019-03-22 DIAGNOSIS — S2232XD Fracture of one rib, left side, subsequent encounter for fracture with routine healing: Secondary | ICD-10-CM | POA: Diagnosis not present

## 2019-03-22 DIAGNOSIS — Z9181 History of falling: Secondary | ICD-10-CM | POA: Diagnosis not present

## 2019-03-22 DIAGNOSIS — E039 Hypothyroidism, unspecified: Secondary | ICD-10-CM | POA: Diagnosis not present

## 2019-03-23 ENCOUNTER — Ambulatory Visit (INDEPENDENT_AMBULATORY_CARE_PROVIDER_SITE_OTHER): Payer: Medicare Other | Admitting: Internal Medicine

## 2019-03-23 DIAGNOSIS — I4891 Unspecified atrial fibrillation: Secondary | ICD-10-CM

## 2019-03-23 DIAGNOSIS — Z7901 Long term (current) use of anticoagulants: Secondary | ICD-10-CM

## 2019-03-23 NOTE — Telephone Encounter (Signed)
Will re-fax orders below from 6/22 for BMET and BNP. Orders have already been released. Req# [id number].

## 2019-03-23 NOTE — Patient Instructions (Addendum)
Description   Continue on same dosage 1/2 tablet daily except 1 tablet on Mondays, Wednesdays and Fridays.  Recheck INR in 6 weeks. Call us with any medication changes or concern [phone] [facility], Main # [phone].

## 2019-03-24 ENCOUNTER — Other Ambulatory Visit: Payer: Self-pay

## 2019-03-24 ENCOUNTER — Other Ambulatory Visit: Payer: Self-pay | Admitting: Physician Assistant

## 2019-03-24 DIAGNOSIS — R609 Edema, unspecified: Secondary | ICD-10-CM | POA: Diagnosis not present

## 2019-03-24 DIAGNOSIS — S2232XD Fracture of one rib, left side, subsequent encounter for fracture with routine healing: Secondary | ICD-10-CM | POA: Diagnosis not present

## 2019-03-24 DIAGNOSIS — I255 Ischemic cardiomyopathy: Secondary | ICD-10-CM | POA: Diagnosis not present

## 2019-03-24 DIAGNOSIS — G620 Drug-induced polyneuropathy: Secondary | ICD-10-CM | POA: Diagnosis not present

## 2019-03-24 DIAGNOSIS — S32030D Wedge compression fracture of third lumbar vertebra, subsequent encounter for fracture with routine healing: Secondary | ICD-10-CM | POA: Diagnosis not present

## 2019-03-24 DIAGNOSIS — W19XXXD Unspecified fall, subsequent encounter: Secondary | ICD-10-CM | POA: Diagnosis not present

## 2019-03-24 DIAGNOSIS — T462X5D Adverse effect of other antidysrhythmic drugs, subsequent encounter: Secondary | ICD-10-CM | POA: Diagnosis not present

## 2019-03-24 NOTE — Patient Outreach (Signed)
[facility]) Care Management  03/24/2019  Ricardo Valencia 05-08-1929 [id number]   Telephone call to patient for outreach.  Patient reports that he is hanging in there.  He states that his wife helps to keep him straight.  He states that he is now ambulating with cane in the house.  Discussed fall precautions with patient. PT continues with patient.  Patient states that his weight is stable running between 172-173 lbs.  He is taking medications as prescribed.  Patient denies any further questions or concerns.    Plan: RN CM will outreach patient again within the month of July and patient agrees to next outreach.  Jone Baseman, RN, MSN [place] [facility] Care Management Coordinator Direct Line [phone] Cell [phone] Toll Free: [phone]  Fax: [phone]

## 2019-03-25 LAB — BASIC METABOLIC PANEL
BUN/Creatinine Ratio: 8 — ABNORMAL LOW (ref 10–24)
BUN: 16 mg/dL (ref 8–27)
CO2: 24 mmol/L (ref 20–29)
Calcium: 7.7 mg/dL — ABNORMAL LOW (ref 8.6–10.2)
Chloride: 102 mmol/L (ref 96–106)
Creatinine, Ser: 2.07 mg/dL — ABNORMAL HIGH (ref 0.76–1.27)
GFR calc Af Amer: 32 mL/min/{1.73_m2} — ABNORMAL LOW (ref >59)
GFR calc non Af Amer: 28 mL/min/{1.73_m2} — ABNORMAL LOW (ref >59)
Glucose: 86 mg/dL (ref 65–99)
Potassium: 4 mmol/L (ref 3.5–5.2)
Sodium: 137 mmol/L (ref 134–144)

## 2019-03-25 LAB — PRO B NATRIURETIC PEPTIDE: NT-Pro BNP: 1911 pg/mL — ABNORMAL HIGH (ref 0–486)

## 2019-03-26 ENCOUNTER — Telehealth: Payer: Self-pay

## 2019-03-26 DIAGNOSIS — W19XXXD Unspecified fall, subsequent encounter: Secondary | ICD-10-CM | POA: Diagnosis not present

## 2019-03-26 DIAGNOSIS — T462X5D Adverse effect of other antidysrhythmic drugs, subsequent encounter: Secondary | ICD-10-CM | POA: Diagnosis not present

## 2019-03-26 DIAGNOSIS — S2232XD Fracture of one rib, left side, subsequent encounter for fracture with routine healing: Secondary | ICD-10-CM | POA: Diagnosis not present

## 2019-03-26 DIAGNOSIS — S32030D Wedge compression fracture of third lumbar vertebra, subsequent encounter for fracture with routine healing: Secondary | ICD-10-CM | POA: Diagnosis not present

## 2019-03-26 DIAGNOSIS — I255 Ischemic cardiomyopathy: Secondary | ICD-10-CM | POA: Diagnosis not present

## 2019-03-26 DIAGNOSIS — G620 Drug-induced polyneuropathy: Secondary | ICD-10-CM | POA: Diagnosis not present

## 2019-03-26 NOTE — Telephone Encounter (Signed)
Copied from [place] [phone] [phone]. Topic: Quick Communication - Home Health Verbal Orders >> Mar 26, 2019 11:53 AM Virl Axe D wrote: Caller/Agency: [facility] [place] Number: [phone] / Secure VM Requesting OT/PT/Skilled Nursing/Social Work/Speech Therapy: Discharge Frequency:  Discharge pt per pt and spouse request

## 2019-03-27 NOTE — Telephone Encounter (Signed)
OK 

## 2019-03-27 NOTE — Telephone Encounter (Signed)
Ricardo Valencia and gave her the verbal OK per Dr. [name]. [name] verbalized an understanding.

## 2019-03-27 NOTE — Telephone Encounter (Signed)
Please advise 

## 2019-04-02 ENCOUNTER — Inpatient Hospital Stay: Admission: RE | Admit: 2019-04-02 | Payer: Medicare Other | Source: Ambulatory Visit

## 2019-04-07 ENCOUNTER — Other Ambulatory Visit: Payer: Self-pay

## 2019-04-07 NOTE — Patient Outreach (Signed)
[facility]) Care Management  04/07/2019  Ricardo Valencia 1929/05/29 [id number]   Telephone call to spouse for outreach.  She is able to verify HIPAA. She states that patient is doing better and that his balance is better.  Discussed fall precautions and utilizing assistive devices.  She verbalized understanding. Patient weight remains steady at 171 lbs.  Discussed weight fluctuations and when to notify physicians. She verbalized understanding.  She denies any concerns but thankful for check in.    Plan: RN CM will outreach wife in the month August and wife is agreeable.    Jone Baseman, RN, MSN [place] [facility] Care Management Coordinator Direct Line [phone] Cell [phone] Toll Free: [phone]  Fax: [phone]

## 2019-04-08 ENCOUNTER — Telehealth: Payer: Self-pay | Admitting: Family Medicine

## 2019-04-08 DIAGNOSIS — I255 Ischemic cardiomyopathy: Secondary | ICD-10-CM | POA: Diagnosis not present

## 2019-04-08 DIAGNOSIS — S32030D Wedge compression fracture of third lumbar vertebra, subsequent encounter for fracture with routine healing: Secondary | ICD-10-CM | POA: Diagnosis not present

## 2019-04-08 DIAGNOSIS — G620 Drug-induced polyneuropathy: Secondary | ICD-10-CM | POA: Diagnosis not present

## 2019-04-08 DIAGNOSIS — T462X5D Adverse effect of other antidysrhythmic drugs, subsequent encounter: Secondary | ICD-10-CM | POA: Diagnosis not present

## 2019-04-08 DIAGNOSIS — S2232XD Fracture of one rib, left side, subsequent encounter for fracture with routine healing: Secondary | ICD-10-CM | POA: Diagnosis not present

## 2019-04-08 DIAGNOSIS — W19XXXD Unspecified fall, subsequent encounter: Secondary | ICD-10-CM | POA: Diagnosis not present

## 2019-04-08 NOTE — Telephone Encounter (Signed)
Home Health Verbal Orders - Caller/Agency: [facility] Number: [phone] Requesting OT/PT/Skilled Nursing/Social Work/Speech Therapy: Physical Therapy Frequency: twice a week for three weeks

## 2019-04-08 NOTE — Telephone Encounter (Signed)
OK 

## 2019-04-09 NOTE — Telephone Encounter (Signed)
Called [place]. LVM for okay for verbal order.

## 2019-04-14 ENCOUNTER — Telehealth: Payer: Self-pay | Admitting: Physician Assistant

## 2019-04-14 DIAGNOSIS — S32030D Wedge compression fracture of third lumbar vertebra, subsequent encounter for fracture with routine healing: Secondary | ICD-10-CM | POA: Diagnosis not present

## 2019-04-14 DIAGNOSIS — G620 Drug-induced polyneuropathy: Secondary | ICD-10-CM | POA: Diagnosis not present

## 2019-04-14 DIAGNOSIS — T462X5D Adverse effect of other antidysrhythmic drugs, subsequent encounter: Secondary | ICD-10-CM | POA: Diagnosis not present

## 2019-04-14 DIAGNOSIS — I255 Ischemic cardiomyopathy: Secondary | ICD-10-CM | POA: Diagnosis not present

## 2019-04-14 DIAGNOSIS — W19XXXD Unspecified fall, subsequent encounter: Secondary | ICD-10-CM | POA: Diagnosis not present

## 2019-04-14 DIAGNOSIS — S2232XD Fracture of one rib, left side, subsequent encounter for fracture with routine healing: Secondary | ICD-10-CM | POA: Diagnosis not present

## 2019-04-14 NOTE — Telephone Encounter (Signed)
New Message     Left message to confirm appt

## 2019-04-14 NOTE — Progress Notes (Signed)
Virtual Visit via Video Note   This visit type was conducted due to national recommendations for restrictions regarding the COVID-19 Pandemic (e.g. social distancing) in an effort to limit this patient's exposure and mitigate transmission in our community.  Due to his co-morbid illnesses, this patient is at least at moderate risk for complications without adequate follow up.  This format is felt to be most appropriate for this patient at this time.  All issues noted in this document were discussed and addressed.  A limited physical exam was performed with this format.  Please refer to the patient's chart for his consent to telehealth for [facility].   Had to change to phone visit because wife had phone trouble.  Date:  04/15/2019   ID:  Ricardo Valencia, DOB 04/03/1929, MRN [id number]  Patient Location: Home Provider Location: Home  PCP:  Eulas Post, MD  Cardiologist:  Lauree Chandler, MD   Electrophysiologist:  None   Evaluation Performed:  Follow-Up Visit  Chief Complaint:  Follow up  History of Present Illness:    Ricardo Valencia is a 85 y.o. male with persistent atrial fibrillation, CAD status post DES to the mid LAD 2016, echo at that time LVEF 25 to 30% improved to 45 to 50% atrial fibrillation 2016 cardioverted back to normal sinus rhythm.  Patient had recurrent atrial fibrillation and was on Tikosyn but stopped because of prolonged QT interval and now is on amiodarone. Last echo 06/2017 EF 55 to 60% with basal inferior akinesis mildly dilated RV and mildly decreased RV systolic function.   Patient was last seen by Dr. Aundra Dubin in the heart failure clinic 11/2017 and was doing well.  No aspirin given stable CAD and Coumadin use.  He suspected OSA but the patient did not want to do a sleep study.   I have had several telemedicine visits with the patient recently because of multiple falls and slow pulse.  He sustained L3 lumbar compression fracture and rib  fractures.  I stopped his Toprol and decreased amiodarone to 100 mg daily.  Heart rate came up nicely to the 60s.  Trouble with diastolic heart failure with elevated BNP and I increased his Lasix for couple days.HH felt that CHF was stable.  Patient complains of trouble getting bleeding to stop when he scrapes himself. Worried INR too high. Was 2.1 03/18/19. Legs have no more edema. BP lower today. No dizziness or presyncope. Has lost 6 lbs. Since last visit. Was having trouble with constipation then developed diarrhea after taking laxatives.  The patient does not have symptoms concerning for COVID-19 infection (fever, chills, cough, or new shortness of breath).    Past Medical History:  Diagnosis Date  . CAD (coronary artery disease)    DES to mid LAD 11/10/2014  . Chronic systolic heart failure ([place])   . Hay fever    hay fever, allergies  . History of echocardiogram    Echo 3/17: EF 40%, diff HK, trivial AI, trivial MR, mod LAE, mild reduced RVSF, mod RAE  . Hyperlipidemia   . Hypertension   . Persistent atrial fibrillation    Past Surgical History:  Procedure Laterality Date  . APPENDECTOMY  1947  . CARDIOVERSION N/A 12/30/2014   Procedure: CARDIOVERSION;  Surgeon: Jerline Pain, MD;  Location: [facility];  Service: Cardiovascular;  Laterality: N/A;  . CORONARY ANGIOPLASTY WITH STENT PLACEMENT    . LEFT HEART CATHETERIZATION WITH CORONARY ANGIOGRAM N/A 11/10/2014   Procedure: LEFT HEART CATHETERIZATION WITH  CORONARY ANGIOGRAM;  Surgeon: Wellington Hampshire, MD;  Location: [facility] CATH LAB;  Service: Cardiovascular;  Laterality: N/A;  . PERCUTANEOUS CORONARY STENT INTERVENTION (PCI-S)  11/10/2014   Procedure: PERCUTANEOUS CORONARY STENT INTERVENTION (PCI-S);  Surgeon: Wellington Hampshire, MD;  Location: [facility] CATH LAB;  Service: Cardiovascular;;     Current Meds  Medication Sig  . amiodarone (PACERONE) 200 MG tablet Take 0.5 tablets (100 mg total) by mouth daily.  . Ascorbic Acid (VITAMIN C) 1000  MG tablet Take 1,000 mg by mouth daily.  Marland Kitchen atorvastatin (LIPITOR) 80 MG tablet Take 1 tablet (80 mg total) by mouth daily at 6 PM.  . calcium carbonate (TUMS) 500 MG chewable tablet Chew 1 tablet (200 mg of elemental calcium total) by mouth daily.  . furosemide (LASIX) 40 MG tablet Take 60 mg by mouth daily.  Marland Kitchen gabapentin (NEURONTIN) 300 MG capsule Take 300 mg by mouth 2 (two) times daily.  Marland Kitchen levothyroxine (SYNTHROID, LEVOTHROID) 25 MCG tablet Take 1 tablet (25 mcg total) by mouth daily before breakfast.  . lisinopril (PRINIVIL,ZESTRIL) 2.5 MG tablet Take 2.5 mg by mouth daily.  . nitroGLYCERIN (NITROSTAT) 0.4 MG SL tablet Place 1 tablet (0.4 mg total) under the tongue every 5 (five) minutes x 3 doses as needed for chest pain.  . potassium chloride (K-DUR) 10 MEQ tablet Take 1 tablet by mouth once daily  . primidone (MYSOLINE) 50 MG tablet Take 1 tablet (50 mg total) by mouth at bedtime. 1 tab 1 hour before bed, may repeat once during the night when up to the bathroom  . warfarin (COUMADIN) 5 MG tablet TAKE AS DIRECTED BY MOUTH BY COUMADIN CLINIC (Patient taking differently: 1/2 tablet sat, sun, tues and thur. And then 1 tablet mon,wed,fri)  . [DISCONTINUED] furosemide (LASIX) 40 MG tablet Take 1 tablet by mouth twice daily     Allergies:   Patient has no known allergies.   Social History   Tobacco Use  . Smoking status: Never Smoker  . Smokeless tobacco: Never Used  . Tobacco comment: "smoked when I was 22"  Substance Use Topics  . Alcohol use: Yes    Alcohol/week: 7.0 - 14.0 standard drinks    Types: 7 - 14 Cans of beer per week    Comment: occ  . Drug use: No     Family Hx: The patient's family history includes Cancer in his father and mother.  ROS:   Please see the history of present illness.      All other systems reviewed and are negative.   Prior CV studies:   The following studies were reviewed today:  Echo 11/2018  IMPRESSIONS      1. The left ventricle has  normal systolic function, with an ejection fraction of 55-60%. The cavity size was normal. Left ventricular diastolic Doppler parameters are consistent with pseudonormalization.  2. The right ventricle has normal systolic function. The cavity was normal. There is no increase in right ventricular wall thickness.  3. Left atrial size was moderately dilated.  4. The mitral valve is normal in structure. Mild thickening of the mitral valve leaflet. Mild calcification of the mitral valve leaflet.  5. The tricuspid valve is normal in structure.  6. The aortic valve has an indeterminant number of cusps Moderate thickening of the aortic valve Moderate calcification of the aortic valve. Aortic valve regurgitation is mild by color flow Doppler. mild stenosis of the aortic valve.  7. The pulmonic valve was grossly normal. Pulmonic valve regurgitation is  mild by color flow Doppler.  8. There is mild dilatation of the aortic root measuring 39 mm.   FINDINGS  Left Ventricle: The left ventricle has normal systolic function, with an ejection fraction of 55-60%. The cavity size was normal. There is no increase in left ventricular wall thickness. Left ventricular diastolic Doppler parameters are consistent with  pseudonormalization Right Ventricle: The right ventricle has normal systolic function. The cavity was normal. There is no increase in right ventricular wall thickness. Left Atrium: left atrial size was moderately dilated Right Atrium: right atrial size was normal in size. Right atrial pressure is estimated at 3 mmHg. Interatrial Septum: No atrial level shunt detected by color flow Doppler. Pericardium: There is no evidence of pericardial effusion. Mitral Valve: The mitral valve is normal in structure. Mild thickening of the mitral valve leaflet. Mild calcification of the mitral valve leaflet. Mitral valve regurgitation is mild by color flow Doppler. Tricuspid Valve: The tricuspid valve is normal in structure.  Tricuspid valve regurgitation is mild by color flow Doppler. Aortic Valve: The aortic valve has an indeterminant number of cusps Moderate thickening of the aortic valve Moderate calcification of the aortic valve. Aortic valve regurgitation is mild by color flow Doppler. There is mild stenosis of the aortic valve,  with a calculated valve area of 1.93 cm. Pulmonic Valve: The pulmonic valve was grossly normal. Pulmonic valve regurgitation is mild by color flow Doppler. Aorta: There is mild dilatation of the aortic root measuring 39 mm. Venous: The inferior vena cava is normal in size with greater than 50% respiratory variability. Compared to previous exam: 06/27/17 EF 55-60%.         Labs/Other Tests and Data Reviewed:    EKG:  No ECG reviewed.  Recent Labs: 12/08/2018: ALT 109 02/25/2019: Hemoglobin 10.9; Platelets 118; TSH 9.230 03/24/2019: BUN 16; Creatinine, Ser 2.07; NT-Pro BNP 1,911; Potassium 4.0; Sodium 137   Recent Lipid Panel Lab Results  Component Value Date/Time   CHOL 178 10/01/2017 01:17 PM   TRIG 108 10/01/2017 01:17 PM   HDL 63 10/01/2017 01:17 PM   CHOLHDL 2.8 10/01/2017 01:17 PM   LDLCALC 93 10/01/2017 01:17 PM   LDLDIRECT 191.1 01/04/2011 12:18 PM    Wt Readings from Last 3 Encounters:  04/15/19 166 lb (75.3 kg)  03/18/19 172 lb 8 oz (78.2 kg)  03/03/19 175 lb 9.6 oz (79.7 kg)     Objective:    Vital Signs:  BP 98/69   Pulse 69   Ht 5\' 9"  (1.753 m)   Wt 166 lb (75.3 kg)   BMI 24.51 kg/m    VITAL SIGNS:  reviewed  ASSESSMENT & PLAN:    1. Bradycardia with multiple falls improved with stopping his Toprol and decreasing amiodarone to 100 mg daily 2. Diastolic CHF improved with extra Lasix for 2 days no edema today per patient, BP low and weight down 6 lbs. Crt up to 2.07 on 7/7. Will decrease lasix to 60 mg daily. Check bmet and bnp and INR F/u with me in 2 weeks. 3. Chronic atrial fibrillation on amiodarone which was decreased because of significant  bradycardia 4. Mechanical falls with lumbar fracture 11/2018, rib fracture 01/2019, monitor closely on Coumadin-no recent falls 5. CAD without angina 6. Hyperlipidemia on Lipitor. 7. Trouble getting bleeding to stop when he bumps himself. Will have [place] check INR.   COVID-19 Education: The signs and symptoms of COVID-19 were discussed with the patient and how to seek care for testing (follow up  with PCP or arrange E-visit).  The importance of social distancing was discussed today.  Time:   Today, I have spent  13:30 minutes with the patient with telehealth technology discussing the above problems.     Medication Adjustments/Labs and Tests Ordered: Current medicines are reviewed at length with the patient today.  Concerns regarding medicines are outlined above.   Tests Ordered: No orders of the defined types were placed in this encounter.   Medication Changes: No orders of the defined types were placed in this encounter.   Follow Up:  Virtual Visit in 2 week(s) Ermalinda Barrios PA-C  Signed, Ermalinda Barrios, PA-C  04/15/2019 1:19 PM    [facility]

## 2019-04-15 ENCOUNTER — Telehealth (INDEPENDENT_AMBULATORY_CARE_PROVIDER_SITE_OTHER): Payer: Medicare Other | Admitting: Physician Assistant

## 2019-04-15 ENCOUNTER — Telehealth: Payer: Self-pay | Admitting: *Deleted

## 2019-04-15 ENCOUNTER — Encounter: Payer: Self-pay | Admitting: Physician Assistant

## 2019-04-15 ENCOUNTER — Other Ambulatory Visit: Payer: Self-pay

## 2019-04-15 VITALS — BP 98/69 | HR 69 | Ht 69.0 in | Wt 166.0 lb

## 2019-04-15 DIAGNOSIS — I25810 Atherosclerosis of coronary artery bypass graft(s) without angina pectoris: Secondary | ICD-10-CM | POA: Diagnosis not present

## 2019-04-15 DIAGNOSIS — I4819 Other persistent atrial fibrillation: Secondary | ICD-10-CM

## 2019-04-15 DIAGNOSIS — Z7901 Long term (current) use of anticoagulants: Secondary | ICD-10-CM

## 2019-04-15 DIAGNOSIS — Z9181 History of falling: Secondary | ICD-10-CM | POA: Diagnosis not present

## 2019-04-15 DIAGNOSIS — Z5181 Encounter for therapeutic drug level monitoring: Secondary | ICD-10-CM | POA: Diagnosis not present

## 2019-04-15 DIAGNOSIS — R001 Bradycardia, unspecified: Secondary | ICD-10-CM | POA: Diagnosis not present

## 2019-04-15 DIAGNOSIS — I5032 Chronic diastolic (congestive) heart failure: Secondary | ICD-10-CM | POA: Diagnosis not present

## 2019-04-15 DIAGNOSIS — I2510 Atherosclerotic heart disease of native coronary artery without angina pectoris: Secondary | ICD-10-CM

## 2019-04-15 DIAGNOSIS — E782 Mixed hyperlipidemia: Secondary | ICD-10-CM

## 2019-04-15 NOTE — Patient Instructions (Addendum)
Medication Instructions:  DECREASE: Lasix to 60 mg once a day   If you need a refill on your cardiac medications before your next appointment, please call your pharmacy.   Lab work: BMET, BNP & INR to be done this week by remote health   If you have labs (blood work) drawn today and your tests are completely normal, you will receive your results only by: Marland Kitchen MyChart Message (if you have MyChart) OR . A paper copy in the mail If you have any lab test that is abnormal or we need to change your treatment, we will call you to review the results.  Testing/Procedures: None   Follow-Up: You are scheduled to see Ermalinda Barrios PA-C on 04/28/2019 @ 12:00 pm  Any Other Special Instructions Will Be Listed Below (If Applicable).

## 2019-04-15 NOTE — Telephone Encounter (Signed)
[place] Visit Follow Up Request   Date of Request ([place]):  April 15, 2019  Requesting Provider:  Ermalinda Barrios, PA-C    Agency Requested:    [facility] Contact:  Glory Buff, NP [name] [place], [place] [address] Phone #:  [phone] Fax #:  [phone]  Patient Demographic Information: Name:  Ricardo Valencia Age:  85 y.o.   DOB:  12-23-1928  MRN:  [id number]   Home visit progress note(s), lab results, telemetry strips, etc were reviewed.  Provider Recommendations: NEED LABS; PT HAS BEEN HAVING A LOT OF BLEEDING  AND MICHELE LENZE, PAC WOULD LIKE FOR SOMEONE TO GO OUT THIS WEEK  Follow up home services requested:  Labs:  BMET, PRO BNP, INR; THIS WEEK  1 VISIT  PLEASE SEND MICHELE LENZE, PAC A MESSAGE TO LET HER KNOW IF THE PATIENT IS DEHYDRATED

## 2019-04-15 NOTE — Telephone Encounter (Signed)
-----   Message from Mendel Ryder, [place] sent at 04/15/2019 12:48 PM EDT ----- Regarding: [facility] Home Visit Request  Requesting provider:   Ermalinda Barrios  Please specify if there is a specific agency the provider is requesting:   [facility] (Labs, IV Lasix)  Reason for home visit:   Pt has been having a lot of bleeding and [name] would like for some to come out this week  Is this the initial request or a follow up?:   Follow Up Request  Services requested:   Labs:  BMET, BNP & INR   When services are needed:   THIS WEEK   # of visits/frequency requested:   [place], [place]  04/15/2019 12:49 PM   PLEASE [place]

## 2019-04-16 ENCOUNTER — Other Ambulatory Visit: Payer: Self-pay | Admitting: Family Medicine

## 2019-04-16 ENCOUNTER — Other Ambulatory Visit: Payer: Self-pay | Admitting: Physician Assistant

## 2019-04-16 DIAGNOSIS — W19XXXD Unspecified fall, subsequent encounter: Secondary | ICD-10-CM | POA: Diagnosis not present

## 2019-04-16 DIAGNOSIS — T462X5D Adverse effect of other antidysrhythmic drugs, subsequent encounter: Secondary | ICD-10-CM | POA: Diagnosis not present

## 2019-04-16 DIAGNOSIS — I255 Ischemic cardiomyopathy: Secondary | ICD-10-CM | POA: Diagnosis not present

## 2019-04-16 DIAGNOSIS — G620 Drug-induced polyneuropathy: Secondary | ICD-10-CM | POA: Diagnosis not present

## 2019-04-16 DIAGNOSIS — S32030D Wedge compression fracture of third lumbar vertebra, subsequent encounter for fracture with routine healing: Secondary | ICD-10-CM | POA: Diagnosis not present

## 2019-04-16 DIAGNOSIS — I4819 Other persistent atrial fibrillation: Secondary | ICD-10-CM | POA: Diagnosis not present

## 2019-04-16 DIAGNOSIS — S2232XD Fracture of one rib, left side, subsequent encounter for fracture with routine healing: Secondary | ICD-10-CM | POA: Diagnosis not present

## 2019-04-16 DIAGNOSIS — I5032 Chronic diastolic (congestive) heart failure: Secondary | ICD-10-CM | POA: Diagnosis not present

## 2019-04-17 LAB — BASIC METABOLIC PANEL
BUN/Creatinine Ratio: 8 — ABNORMAL LOW (ref 10–24)
BUN: 16 mg/dL (ref 8–27)
CO2: 25 mmol/L (ref 20–29)
Calcium: 7.8 mg/dL — ABNORMAL LOW (ref 8.6–10.2)
Chloride: 100 mmol/L (ref 96–106)
Creatinine, Ser: 2.1 mg/dL — ABNORMAL HIGH (ref 0.76–1.27)
GFR calc Af Amer: 32 mL/min/{1.73_m2} — ABNORMAL LOW (ref >59)
GFR calc non Af Amer: 27 mL/min/{1.73_m2} — ABNORMAL LOW (ref >59)
Glucose: 94 mg/dL (ref 65–99)
Potassium: 4.1 mmol/L (ref 3.5–5.2)
Sodium: 138 mmol/L (ref 134–144)

## 2019-04-17 LAB — PROTIME-INR
INR: 1.7 — ABNORMAL HIGH (ref 0.8–1.2)
Prothrombin Time: 18.1 s — ABNORMAL HIGH (ref 9.1–12.0)

## 2019-04-17 LAB — PRO B NATRIURETIC PEPTIDE: NT-Pro BNP: 1635 pg/mL — ABNORMAL HIGH (ref 0–486)

## 2019-04-17 NOTE — Telephone Encounter (Signed)
Refill for 6 months. 

## 2019-04-17 NOTE — Telephone Encounter (Signed)
OK to fill? Last filled by historical provider

## 2019-04-21 ENCOUNTER — Other Ambulatory Visit: Payer: Self-pay

## 2019-04-21 NOTE — Patient Outreach (Signed)
[facility]) Care Management  04/21/2019  Ricardo Valencia Jan 26, 1929 [id number]   Telephone call to patient for follow up.  No answer.  HIPAA compliant voice message left.    Plan: RN CM will attempt patient again within 1 week and send letter.  Ricardo Baseman, RN, MSN [place] Management Care Management Coordinator Direct Line [phone] Cell [phone] Toll Free: [phone]  Fax: [phone]

## 2019-04-22 ENCOUNTER — Other Ambulatory Visit: Payer: Self-pay

## 2019-04-22 NOTE — Patient Outreach (Signed)
[facility]) Care Management  04/22/2019  Ricardo Valencia [birth year] [id number]   Telephone call to patient. Spoke with spouse.  She is able to verify HIPAA.  She states that patient is doing good and that PT has stopped as they felt patient had reached his maximum benefit.  Patient continues to utilize cane for ambulation. Discussed continued use of cane and preventing falls.  She verbalize understanding. Patient weight is 169.8 lbs.  Emphasized importance of weights.  Wife declines any questions, concerns, or needs at this time but thankful for calls.    Plan: RN CM will outreach again later in the month and spouse agreeable.    Jone Baseman, RN, MSN [place] Management Care Management Coordinator Direct Line [phone] Cell [phone] Toll Free: [phone]  Fax: [phone]

## 2019-04-27 ENCOUNTER — Ambulatory Visit: Payer: Self-pay

## 2019-04-28 ENCOUNTER — Ambulatory Visit: Payer: Medicare Other | Admitting: Physician Assistant

## 2019-05-01 ENCOUNTER — Telehealth: Payer: Self-pay | Admitting: *Deleted

## 2019-05-01 ENCOUNTER — Telehealth: Payer: Self-pay | Admitting: Cardiovascular Disease

## 2019-05-01 DIAGNOSIS — Z7901 Long term (current) use of anticoagulants: Secondary | ICD-10-CM

## 2019-05-01 DIAGNOSIS — I480 Paroxysmal atrial fibrillation: Secondary | ICD-10-CM

## 2019-05-01 NOTE — Telephone Encounter (Signed)
[place] Visit Follow-up Request  Date of Request ([place]):  May 01, 2019  Requesting Provider:  Marcelle Overlie, PharmD per Dr. Angelena Form    Agency Requested:    [facility] Contact:  Glory Buff, NP [address] [place], [place] [address] Phone #:  [phone] Fax #:  [phone]  Patient Demographic Information: Name:  Ricardo Valencia Age:  85 y.o.   DOB:  07-12-1929  MRN:  [id number]   Address:   [place] [address]   Phone Numbers:   Home Phone [phone]  Mobile [phone]     Emergency Contact Information on File:   Contact Information    Name Relation Home Work Mobile   Ricardo, Valencia [phone]  [phone]      The above family members may be contacted for information on this patient (review DPR on file):  Yes    Patient Clinical Information:  Primary Care Provider:  Eulas Post, MD  Primary Cardiologist:  Lauree Chandler, MD  Primary Electrophysiologist:  None   Past Medical Hx: Ricardo Valencia  has a past medical history of CAD (coronary artery disease), Chronic systolic heart failure ([place]), Hay fever, History of echocardiogram, Hyperlipidemia, Hypertension, and Persistent atrial fibrillation.   Allergies: He has No Known Allergies.   Medications: Current Outpatient Medications on File Prior to Visit  Medication Sig  . amiodarone (PACERONE) 200 MG tablet Take 0.5 tablets (100 mg total) by mouth daily.  . Ascorbic Acid (VITAMIN C) 1000 MG tablet Take 1,000 mg by mouth daily.  Marland Kitchen atorvastatin (LIPITOR) 80 MG tablet Take 1 tablet (80 mg total) by mouth daily at 6 PM.  . calcium carbonate (TUMS) 500 MG chewable tablet Chew 1 tablet (200 mg of elemental calcium total) by mouth daily.  . furosemide (LASIX) 40 MG tablet Take 60 mg by mouth daily.  Marland Kitchen gabapentin (NEURONTIN) 300 MG capsule TAKE 1 CAPSULE BY MOUTH EVERY DAY AT BEDTIME  . levothyroxine (SYNTHROID,  LEVOTHROID) 25 MCG tablet Take 1 tablet (25 mcg total) by mouth daily before breakfast.  . lisinopril (PRINIVIL,ZESTRIL) 2.5 MG tablet Take 2.5 mg by mouth daily.  . nitroGLYCERIN (NITROSTAT) 0.4 MG SL tablet Place 1 tablet (0.4 mg total) under the tongue every 5 (five) minutes x 3 doses as needed for chest pain.  . potassium chloride (K-DUR) 10 MEQ tablet Take 1 tablet by mouth once daily  . primidone (MYSOLINE) 50 MG tablet Take 1 tablet (50 mg total) by mouth at bedtime. 1 tab 1 hour before bed, may repeat once during the night when up to the bathroom  . warfarin (COUMADIN) 5 MG tablet TAKE AS DIRECTED BY MOUTH BY COUMADIN CLINIC (Patient taking differently: 1/2 tablet sat, sun, tues and thur. And then 1 tablet mon,wed,fri)   No current facility-administered medications on file prior to visit.      Social Hx: He  reports that he has never smoked. He has never used smokeless tobacco. He reports current alcohol use of about 7.0 - 14.0 standard drinks of alcohol per week. He reports that he does not use drugs.    Diagnosis/Reason for Visit:   Afib on chronic coumadin- needs his INR checked. Was subtherapeutic on last check  Services Requested:  Labs:  INR  # of Visits Needed/Frequency per Week: 1  A copy of the office note will be faxed with this form. All labs ordered for this home visit have been released and the request  was sent to Chrissie Noa at [facility].

## 2019-05-01 NOTE — Telephone Encounter (Signed)
Patient wife made aware that orders have been placed for a home visit for labs

## 2019-05-01 NOTE — Telephone Encounter (Signed)
Looks like we sent someone out to his house once before. Is it possible to do this again?

## 2019-05-01 NOTE — Telephone Encounter (Signed)
[place] Visit Follow Up Request   Date of Request ([place]):  May 01, 2019  Requesting Provider:  Marcelle Overlie, PHARM-D PER DR,    Agency Requested:    [facility] Contact:  Glory Buff, NP [address] [place], [place] [address] Phone #:  [phone] Fax #:  [phone]  Patient Demographic Information: Name:  Ricardo Valencia Age:  86 y.o.   DOB:  Aug 27, 1929  MRN:  [id number]   Home visit progress note(s), lab results, telemetry strips, etc were reviewed.  Provider Recommendations: LABS; INR  Follow up home services requested:  Labs:  INR   NUMBER OF VISITS: 1  TO BE DONE NEXT WEEK

## 2019-05-01 NOTE — Telephone Encounter (Signed)
Patient's wife called stating her patient doesn't want to leave house.  He has appt on 8/18, he wants to know if a nurse can come to the house to check his blood.

## 2019-05-06 ENCOUNTER — Other Ambulatory Visit: Payer: Self-pay

## 2019-05-06 DIAGNOSIS — Z7901 Long term (current) use of anticoagulants: Secondary | ICD-10-CM | POA: Diagnosis not present

## 2019-05-06 NOTE — Patient Outreach (Signed)
[facility]) Care Management  05/06/2019  Ricardo Valencia [birth year] [id number]   Telephone call to patient for check in.  He is able to verify HIPAA.  He states that he is doing good with the help of his wife. He states weights are good and that he is using the walker and cane to get around. Discussed using devices and preventing falls.  Patient states he is eating a good diet and feels he is in a good place right now.  Encouraged patient to continue current regimen.  He verbalized understanding and denies any needs presently.    Plan: RN CM will contact patient next month and patient agreeable.    Jone Baseman, RN, MSN [place] Management Care Management Coordinator Direct Line [phone] Cell [phone] Toll Free: [phone]  Fax: [phone]

## 2019-05-06 NOTE — Progress Notes (Signed)
Met with patient in home  Lab drawn for PT/INR taken to [facility]. Patient states easy to bleed  Note thin skin and easy bruising Full note to be scanned

## 2019-05-07 LAB — PROTIME-INR
INR: 1.6 — ABNORMAL HIGH (ref 0.8–1.2)
Prothrombin Time: 16.4 s — ABNORMAL HIGH (ref 9.1–12.0)

## 2019-05-10 ENCOUNTER — Other Ambulatory Visit: Payer: Self-pay | Admitting: Cardiovascular Disease

## 2019-05-12 ENCOUNTER — Telehealth: Payer: Self-pay | Admitting: *Deleted

## 2019-05-12 DIAGNOSIS — I480 Paroxysmal atrial fibrillation: Secondary | ICD-10-CM

## 2019-05-12 DIAGNOSIS — Z7901 Long term (current) use of anticoagulants: Secondary | ICD-10-CM

## 2019-05-12 MED ORDER — FUROSEMIDE 40 MG PO TABS: 60.0000 mg | ORAL_TABLET | Freq: Every day | ORAL | 3 refills | Status: DC

## 2019-05-12 NOTE — Telephone Encounter (Signed)
[place] Visit Follow Up Request   Date of Request ([place]):  May 12, 2019  Requesting Provider:  Ermalinda Barrios, [facility]    Agency Requested:    [facility] Contact:  Glory Buff, NP [address] [place], [place] [address] Phone #:  [phone] Fax #:  [phone]  Patient Demographic Information: Name:  Ricardo Valencia Age:  85 y.o.   DOB:  03/30/1929  MRN:  [id number]   Home visit progress note(s), lab results, telemetry strips, etc were reviewed.  Provider Recommendations: INR CHECK  Follow up home services requested:  Labs:  INR    TO BE DONE EITHER WED 05/13/19 OR 05/14/19 WITH RESULTS TO BE CALLED TO THE COUMADIN CLINIC

## 2019-05-12 NOTE — Telephone Encounter (Addendum)
Prescription refill request received for Coumadin. Pt overdue for an appointment. Called pt and spoke to his wife who stated that he had his INR drawn on 8/18 ( INR 1.6). Told pt's wife he needs to come in to get his INR checked. [name] stated that he dose not want to be coming in for appointment's because of Covid. Explained to the pt's wife he is at a higher risk for a clot or a stroke when his level is below his goal of 2.0-3.0. [name] asked if some one from home health can come out to check his INR. Informed pt's wife we would check in and see and would like for pt to get his INR checked this week. [name] stated pt has 5 more tabs of coumadin. Will refill prescription.

## 2019-05-13 DIAGNOSIS — Z7901 Long term (current) use of anticoagulants: Secondary | ICD-10-CM | POA: Diagnosis not present

## 2019-05-13 DIAGNOSIS — I480 Paroxysmal atrial fibrillation: Secondary | ICD-10-CM | POA: Diagnosis not present

## 2019-05-14 ENCOUNTER — Ambulatory Visit: Payer: Medicare Other | Admitting: Adult Health

## 2019-05-14 ENCOUNTER — Ambulatory Visit (INDEPENDENT_AMBULATORY_CARE_PROVIDER_SITE_OTHER): Payer: Medicare Other | Admitting: Cardiology

## 2019-05-14 DIAGNOSIS — Z7901 Long term (current) use of anticoagulants: Secondary | ICD-10-CM | POA: Diagnosis not present

## 2019-05-14 LAB — PROTIME-INR
INR: 1.9 — ABNORMAL HIGH (ref 0.8–1.2)
Prothrombin Time: 20.1 s — ABNORMAL HIGH (ref 9.1–12.0)

## 2019-05-14 NOTE — Patient Instructions (Signed)
Description    Spoke with pt's wife per pt's request, will advise pt to take 1 tablet today, then continue on same dosage 1/2 tablet daily except 1 tablet on Mondays, Wednesdays and Fridays. Recheck INR in 5 weeks. Call us with any medication changes or concern [phone] Coumadin Clinic, Main # [phone].

## 2019-05-14 NOTE — Progress Notes (Signed)
Ricardo Valencia       DOB: June 25, 1929  Purpose of Visit: Lab Draw:  PT/INR Ordering provider: [facility]  Medications: Is the patient taking all medications listed on MAR from Epic? Yes  List any medications that are not being taken correctly: none  List any medication refills needed:none  Is the patient able to pick up medications? No but wife picks them up.       Any patient concerns?  Wife Ricardo Valencia is concerned that [name] rarely uses his cane now and she feels he "is often unbalanced" but reports no falls since the fall that let to hospitalization several months back.  [name] states he continues the exercises QD that were given to him by his home PT.   PT/INR drawn and brought to the [facility] on [address]    [place], [place] 05/14/19

## 2019-05-19 ENCOUNTER — Telehealth (INDEPENDENT_AMBULATORY_CARE_PROVIDER_SITE_OTHER): Payer: Medicare Other | Admitting: Family Medicine

## 2019-05-19 ENCOUNTER — Encounter: Payer: Self-pay | Admitting: Family Medicine

## 2019-05-19 DIAGNOSIS — R259 Unspecified abnormal involuntary movements: Secondary | ICD-10-CM

## 2019-05-19 DIAGNOSIS — I25810 Atherosclerosis of coronary artery bypass graft(s) without angina pectoris: Secondary | ICD-10-CM

## 2019-05-19 MED ORDER — PRIMIDONE 50 MG PO TABS: 50.0000 mg | ORAL_TABLET | Freq: Two times a day (BID) | ORAL | 6 refills | Status: DC

## 2019-05-19 NOTE — Progress Notes (Signed)
PATIENT: Ricardo Valencia DOB: [birth year]  REASON FOR VISIT: follow up HISTORY FROM: patient  Virtual Visit via Telephone Note  I connected with Ricardo Valencia on 05/19/19 at 10:30 AM EDT by telephone and verified that I am speaking with the correct person using two identifiers.   I discussed the limitations, risks, security and privacy concerns of performing an evaluation and management service by telephone and the availability of in person appointments. I also discussed with the patient that there may be a patient responsible charge related to this service. The patient expressed understanding and agreed to proceed.   History of Present Illness:  05/19/19 Ricardo Valencia is a 85 y.o. male here today for follow up for essential tremor. He continues primidone 50mg  around 6pm and again at bedtime. He is tolerating medication with no adverse effects. He reports that he has not noticed much improvement in tremor but is concerned about taking medications at night. He is uncertain why this was advised. He does not wake throughout the night. He feels that tremor is worse during the day. Tremor is not very noticeable at rest but definitely worse with action. He denies changes in gait, speech, vision, trouble swallowing. He has an extensive cardiac history. BP usually low 100's/50-60's. Pulse 50-55. Creatinine 2.10 on 04/16/2019. He is on coumadin with regular INR's.   History (copied from [employer] note on 06/10/2018)  06/10/18 CD  Ricardo Blake [name] a 85 y.o.malepatient of [place] descent whose wife has ben a patient here. He isseenon 9-24-2019here in a referral from Dr. Olivia Canter evaluation of tremors.Mr. Kieschnick was diagnosed around the year 2012 or so with essential tremor by his thenfrom her care physician, Dr. Stevie Kern. He reports that over the last 2 to 3 years he has noted a significant increase in amplitude, probably around March 2017. His tremor is  asymmetrical and affects the right dominant hand at rest, there is also a reduced grip strength in the right hand. More in the left hand there is a tremor only noted when he is performing an action. He is soft spoken and reports changes in penmanship. He has a little droop in the right shoulder without cog-wheeling.   UPDATE 2/4/2020CM Ms.Gandolfi, 85 year old male returns for follow-up with history of tremors predominantly the tremor is in his right hand but he also complains of tremor at night when he takes his dentures out.  He denies any specific weakness although he does have right shoulder droop.  He has had one fall since last seen on unlevel surfaces.  He gets no regular exercise and was encouraged to do so.  He was placed on low-dose primidone at his last visit.  His wife says it is not working he says it is helping.  He returns for reevaluation   Observations/Objective:  Generalized: Well developed, in no acute distress  Mentation: Alert oriented to time, place, history taking. Follows all commands speech and language fluent   Assessment and Plan:  85 y.o. year old male  has a past medical history of CAD (coronary artery disease), Chronic systolic heart failure ([place]), Hay fever, History of echocardiogram, Hyperlipidemia, Hypertension, and Persistent atrial fibrillation. here with    ICD-10-CM   1. Mixed action and resting tremor  R25.9    Ricardo Valencia is tolerating primidone 50 mg twice daily with no adverse effects.  Unfortunately, he is concerned that tremor is worse in the day.  I have advised that we change the times that he takes this  medication.  We have calculated creatinine clearance (27) and based on recommendation, we will dose BID and try to spread out dosing. He was advised to take 50mg  when waking around 9:30-10am. Second dose around 6pm.  He will monitor for any adverse effects.  I do not feel the propranolol is a good option at this time.  There are no concerning Parkinson  symptoms elicited in HPI.  Due to tele-visit, I am unable to perform physical exam.  Patient is oriented to time place and situation.  There have been no falls.  I have suggested that we consider bringing him again for an in office visit in 6 months, however, we will reassess closer to time to ensure that he is not placed in an unsafe environment.  He will continue close follow-up with primary care.  He will call with any worsening concerns.  He and his wife both verbalized understanding and agreement with this plan.   No orders of the defined types were placed in this encounter.   Meds ordered this encounter  Medications   primidone (MYSOLINE) 50 MG tablet    Sig: Take 1 tablet (50 mg total) by mouth 2 (two) times daily. Take at 10am and 6pm    Dispense:  60 tablet    Refill:  6    Order Specific Question:   Supervising Provider    Answer:   Melvenia Beam [id number]     Follow Up Instructions:  I discussed the assessment and treatment plan with the patient. The patient was provided an opportunity to ask questions and all were answered. The patient agreed with the plan and demonstrated an understanding of the instructions.   The patient was advised to call back or seek an in-person evaluation if the symptoms worsen or if the condition fails to improve as anticipated.  I provided 25 minutes of non-face-to-face time during this encounter.  Patient and his wife are located at their place of residence during [place].  My chart visit initially, however, patient lost Internet access.  Visit converted to teleconference.  Provider is located in the office.   Debbora Presto, NP

## 2019-05-20 ENCOUNTER — Other Ambulatory Visit: Payer: Self-pay

## 2019-05-20 ENCOUNTER — Encounter: Payer: Self-pay | Admitting: Physician Assistant

## 2019-05-20 ENCOUNTER — Telehealth (INDEPENDENT_AMBULATORY_CARE_PROVIDER_SITE_OTHER): Payer: Medicare Other | Admitting: Physician Assistant

## 2019-05-20 VITALS — BP 111/55 | HR 57 | Ht 69.0 in | Wt 171.0 lb

## 2019-05-20 DIAGNOSIS — W19XXXD Unspecified fall, subsequent encounter: Secondary | ICD-10-CM

## 2019-05-20 DIAGNOSIS — I5032 Chronic diastolic (congestive) heart failure: Secondary | ICD-10-CM | POA: Diagnosis not present

## 2019-05-20 DIAGNOSIS — R001 Bradycardia, unspecified: Secondary | ICD-10-CM | POA: Diagnosis not present

## 2019-05-20 DIAGNOSIS — Z9181 History of falling: Secondary | ICD-10-CM

## 2019-05-20 DIAGNOSIS — I4819 Other persistent atrial fibrillation: Secondary | ICD-10-CM

## 2019-05-20 DIAGNOSIS — E785 Hyperlipidemia, unspecified: Secondary | ICD-10-CM | POA: Diagnosis not present

## 2019-05-20 DIAGNOSIS — I25812 Atherosclerosis of bypass graft of coronary artery of transplanted heart without angina pectoris: Secondary | ICD-10-CM

## 2019-05-20 NOTE — Patient Outreach (Signed)
[facility] Care Management  05/20/2019  Demetri Dalpiaz Hollis 1929-07-17 [id number]   Telephone call to patient for follow up call.  No answer.  HIPAA compliant voice message left.  Plan: RN CM will attempt again within 4 business days.     Jone Baseman, RN, MSN [facility] Care Management Coordinator Direct Line [phone] Cell [phone] Toll Free: [phone]  Fax: [phone]

## 2019-05-20 NOTE — Patient Instructions (Addendum)
Medication Instructions:  Your physician recommends that you continue on your current medications as directed. Please refer to the Current Medication list given to you today.  If you need a refill on your cardiac medications before your next appointment, please call your pharmacy.   Lab work: We will arrange for Home Health to come out and draw labs (BMET, BNP, and INR) in 2 weeks  If you have labs (blood work) drawn today and your tests are completely normal, you will receive your results only by: Marland Kitchen MyChart Message (if you have MyChart) OR . A paper copy in the mail If you have any lab test that is abnormal or we need to change your treatment, we will call you to review the results.  Testing/Procedures: None ordered  Follow-Up: . Follow up with Ermalinda Barrios, PA for VIDEO Visit on 06/24/19 at 1:30 PM  Any Other Special Instructions Will Be Listed Below (If Applicable).

## 2019-05-20 NOTE — Progress Notes (Signed)
Virtual Visit via Video Note   This visit type was conducted due to national recommendations for restrictions regarding the COVID-19 Pandemic (e.g. social distancing) in an effort to limit this patient's exposure and mitigate transmission in our community.  Due to his co-morbid illnesses, this patient is at least at moderate risk for complications without adequate follow up.  This format is felt to be most appropriate for this patient at this time.  All issues noted in this document were discussed and addressed.  A limited physical exam was performed with this format.  Please refer to the patient's chart for his consent to telehealth for [facility].   Date:  05/20/2019   ID:  Ricardo Valencia, DOB 1929-03-17, MRN [id number]  Patient Location: Home Provider Location: Office  PCP:  Eulas Post, MD  Cardiologist:  Lauree Chandler, MD   Electrophysiologist:  None   Evaluation Performed:  Follow-Up Visit  Chief Complaint:  Follow up  History of Present Illness:    Ricardo Valencia is a 85 y.o. male with with persistent atrial fibrillation, CAD status post DES to the mid LAD 2016, echo at that time LVEF 25 to 30% improved to 45 to 50% atrial fibrillation 2016 cardioverted back to normal sinus rhythm.  Patient had recurrent atrial fibrillation and was on Tikosyn but stopped because of prolonged QT interval and now is on amiodarone. Last echo 06/2017 EF 55 to 60% with basal inferior akinesis mildly dilated RV and mildly decreased RV systolic function.   Patient was last seen by Dr. Aundra Dubin in the heart failure clinic 11/2017 and was doing well.  No aspirin given stable CAD and Coumadin use.  He suspected OSA but the patient did not want to do a sleep study.   I have had several telemedicine visits with the patient recently because of multiple falls and slow pulse.  He sustained L3 lumbar compression fracture and rib fractures.  I stopped his Toprol and decreased amiodarone to 100  mg daily.  Heart rate came up nicely to the 60s.  Trouble with diastolic heart failure with elevated BNP and I increased his Lasix for couple days.HH felt that CHF was stable.   LOV 04/15/19 BP low and weight down 6 lbs crt up 2.10 and BNP 1635 on 7/30. I decreased lasix 60 mg daily.  Swelling was better but this am he seems to be a little swollen today. Weight 171 today last visit 166 but they don't think he was ever that low. Overall no shortness of breath. Worried about his coumadin being 1.9.  The patient does not have symptoms concerning for COVID-19 infection (fever, chills, cough, or new shortness of breath).    Past Medical History:  Diagnosis Date  . CAD (coronary artery disease)    DES to mid LAD 11/10/2014  . Chronic systolic heart failure ([place])   . Hay fever    hay fever, allergies  . History of echocardiogram    Echo 3/17: EF 40%, diff HK, trivial AI, trivial MR, mod LAE, mild reduced RVSF, mod RAE  . Hyperlipidemia   . Hypertension   . Persistent atrial fibrillation    Past Surgical History:  Procedure Laterality Date  . APPENDECTOMY  1947  . CARDIOVERSION N/A 12/30/2014   Procedure: CARDIOVERSION;  Surgeon: Jerline Pain, MD;  Location: [facility];  Service: Cardiovascular;  Laterality: N/A;  . CORONARY ANGIOPLASTY WITH STENT PLACEMENT    . LEFT HEART CATHETERIZATION WITH CORONARY ANGIOGRAM N/A 11/10/2014  Procedure: LEFT HEART CATHETERIZATION WITH CORONARY ANGIOGRAM;  Surgeon: Wellington Hampshire, MD;  Location: [facility] CATH LAB;  Service: Cardiovascular;  Laterality: N/A;  . PERCUTANEOUS CORONARY STENT INTERVENTION (PCI-S)  11/10/2014   Procedure: PERCUTANEOUS CORONARY STENT INTERVENTION (PCI-S);  Surgeon: Wellington Hampshire, MD;  Location: [facility] CATH LAB;  Service: Cardiovascular;;     Current Meds  Medication Sig  . amiodarone (PACERONE) 200 MG tablet Take 0.5 tablets (100 mg total) by mouth daily.  . Ascorbic Acid (VITAMIN C) 1000 MG tablet Take 1,000 mg by mouth daily.  Marland Kitchen  atorvastatin (LIPITOR) 80 MG tablet Take 1 tablet (80 mg total) by mouth daily at 6 PM.  . calcium carbonate (TUMS) 500 MG chewable tablet Chew 1 tablet (200 mg of elemental calcium total) by mouth daily.  . furosemide (LASIX) 40 MG tablet Take 1.5 tablets (60 mg total) by mouth daily.  Marland Kitchen gabapentin (NEURONTIN) 300 MG capsule TAKE 1 CAPSULE BY MOUTH EVERY DAY AT BEDTIME  . levothyroxine (SYNTHROID, LEVOTHROID) 25 MCG tablet Take 1 tablet (25 mcg total) by mouth daily before breakfast.  . lisinopril (PRINIVIL,ZESTRIL) 2.5 MG tablet Take 2.5 mg by mouth daily.  . nitroGLYCERIN (NITROSTAT) 0.4 MG SL tablet Place 1 tablet (0.4 mg total) under the tongue every 5 (five) minutes x 3 doses as needed for chest pain.  . potassium chloride (K-DUR) 10 MEQ tablet Take 1 tablet by mouth once daily  . primidone (MYSOLINE) 50 MG tablet Take 1 tablet (50 mg total) by mouth 2 (two) times daily. Take at 10am and 6pm  . warfarin (COUMADIN) 5 MG tablet TAKE AS DIRECTED BY  COUMADIN  CLINIC     Allergies:   Patient has no known allergies.   Social History   Tobacco Use  . Smoking status: Never Smoker  . Smokeless tobacco: Never Used  . Tobacco comment: "smoked when I was 22"  Substance Use Topics  . Alcohol use: Yes    Alcohol/week: 7.0 - 14.0 standard drinks    Types: 7 - 14 Cans of beer per week    Comment: occ  . Drug use: No     Family Hx: The patient's family history includes Cancer in his father and mother.  ROS:   Please see the history of present illness.      All other systems reviewed and are negative.   Prior CV studies:   The following studies were reviewed today:  Echo 11/2018  IMPRESSIONS      1. The left ventricle has normal systolic function, with an ejection fraction of 55-60%. The cavity size was normal. Left ventricular diastolic Doppler parameters are consistent with pseudonormalization.  2. The right ventricle has normal systolic function. The cavity was normal. There is no  increase in right ventricular wall thickness.  3. Left atrial size was moderately dilated.  4. The mitral valve is normal in structure. Mild thickening of the mitral valve leaflet. Mild calcification of the mitral valve leaflet.  5. The tricuspid valve is normal in structure.  6. The aortic valve has an indeterminant number of cusps Moderate thickening of the aortic valve Moderate calcification of the aortic valve. Aortic valve regurgitation is mild by color flow Doppler. mild stenosis of the aortic valve.  7. The pulmonic valve was grossly normal. Pulmonic valve regurgitation is mild by color flow Doppler.  8. There is mild dilatation of the aortic root measuring 39 mm.   FINDINGS  Left Ventricle: The left ventricle has normal systolic function, with  an ejection fraction of 55-60%. The cavity size was normal. There is no increase in left ventricular wall thickness. Left ventricular diastolic Doppler parameters are consistent with  pseudonormalization Right Ventricle: The right ventricle has normal systolic function. The cavity was normal. There is no increase in right ventricular wall thickness. Left Atrium: left atrial size was moderately dilated Right Atrium: right atrial size was normal in size. Right atrial pressure is estimated at 3 mmHg. Interatrial Septum: No atrial level shunt detected by color flow Doppler. Pericardium: There is no evidence of pericardial effusion. Mitral Valve: The mitral valve is normal in structure. Mild thickening of the mitral valve leaflet. Mild calcification of the mitral valve leaflet. Mitral valve regurgitation is mild by color flow Doppler. Tricuspid Valve: The tricuspid valve is normal in structure. Tricuspid valve regurgitation is mild by color flow Doppler. Aortic Valve: The aortic valve has an indeterminant number of cusps Moderate thickening of the aortic valve Moderate calcification of the aortic valve. Aortic valve regurgitation is mild by color flow  Doppler. There is mild stenosis of the aortic valve,  with a calculated valve area of 1.93 cm. Pulmonic Valve: The pulmonic valve was grossly normal. Pulmonic valve regurgitation is mild by color flow Doppler. Aorta: There is mild dilatation of the aortic root measuring 39 mm. Venous: The inferior vena cava is normal in size with greater than 50% respiratory variability. Compared to previous exam: 06/27/17 EF 55-60%.         Labs/Other Tests and Data Reviewed:    EKG:  No ECG reviewed.  Recent Labs: 12/08/2018: ALT 109 02/25/2019: Hemoglobin 10.9; Platelets 118; TSH 9.230 04/16/2019: BUN 16; Creatinine, Ser 2.10; NT-Pro BNP 1,635; Potassium 4.1; Sodium 138   Recent Lipid Panel Lab Results  Component Value Date/Time   CHOL 178 10/01/2017 01:17 PM   TRIG 108 10/01/2017 01:17 PM   HDL 63 10/01/2017 01:17 PM   CHOLHDL 2.8 10/01/2017 01:17 PM   LDLCALC 93 10/01/2017 01:17 PM   LDLDIRECT 191.1 01/04/2011 12:18 PM    Wt Readings from Last 3 Encounters:  05/20/19 171 lb (77.6 kg)  05/14/19 172 lb 4.8 oz (78.2 kg)  04/15/19 166 lb (75.3 kg)     Objective:    Vital Signs:  BP (!) 111/55   Pulse (!) 57   Ht 5\' 9"  (1.753 m)   Wt 171 lb (77.6 kg)   BMI 25.25 kg/m    VITAL SIGNS:  reviewed GEN:  no acute distress RESPIRATORY:  normal respiratory effort, symmetric expansion CARDIOVASCULAR:  lower extremity edema noted  ASSESSMENT & PLAN:    1. Bradycardia with multiple falls improved with stopping his Toprol and decreasing amiodarone to 100 mg daily 2. Diastolic CHF BP low and weight down 6 lbs. Crt up to 2.07 on 7/7. I decrease lasix to 60 mg daily. Weight up to 171 which is about his baseline.  3. Chronic atrial fibrillation on amiodarone which was decreased because of significant bradycardia. On coumadin and INR 1.9. Patient worried about it being too low. Will have [place] check in 2 weeks with bmet and bnp. Send to coumadin clinic 4. Mechanical falls with lumbar fracture  11/2018, rib fracture 01/2019, monitor closely on Coumadin-no recent falls 5. CAD without angina 6. Hyperlipidemia on Lipitor.      COVID-19 Education: The signs and symptoms of COVID-19 were discussed with the patient and how to seek care for testing (follow up with PCP or arrange E-visit).   The importance of social distancing was  discussed today.  Time:   Today, I have spent  15:20 minutes with the patient with telehealth technology discussing the above problems.     Medication Adjustments/Labs and Tests Ordered: Current medicines are reviewed at length with the patient today.  Concerns regarding medicines are outlined above.   Tests Ordered: No orders of the defined types were placed in this encounter.   Medication Changes: No orders of the defined types were placed in this encounter.   Follow Up:  Virtual Visit in 1 month(s) Ermalinda Barrios PA-C  Signed, Ermalinda Barrios, PA-C  05/20/2019 2:06 PM    [place]

## 2019-05-22 ENCOUNTER — Other Ambulatory Visit: Payer: Self-pay

## 2019-05-22 NOTE — Patient Outreach (Signed)
[facility]) Care Management  05/22/2019  Mats Heaster Price 1929/02/03 [id number]   Telephone call to patient for check in.  Patient reports he is doing ok.  He states he has not had any recent falls.  He states that his weight remains around 171 lbs.  He takes medications as prescribed and watches his salt intake.  Patient states he is aware of signs of heart failure and wife does also and keeps him in line.  Patient using cane at times with ambulation but sometimes does not use anything.  Advised patient on fall precautions. He verbalized understanding and denies any problems.  Discussed with patient transfer to health coach as he has done well and just needs disease management at this time.  He verbalized understanding.    Plan: RN CM will send to health coach for disease management and support of heart failure.    Jone Baseman, RN, MSN [facility] Care Management Coordinator Direct Line [phone] Cell [phone] Toll Free: [phone]  Fax: [phone]

## 2019-05-26 ENCOUNTER — Other Ambulatory Visit: Payer: Self-pay

## 2019-05-28 ENCOUNTER — Telehealth: Payer: Self-pay

## 2019-05-28 DIAGNOSIS — Z7901 Long term (current) use of anticoagulants: Secondary | ICD-10-CM

## 2019-05-28 DIAGNOSIS — I4819 Other persistent atrial fibrillation: Secondary | ICD-10-CM

## 2019-05-28 DIAGNOSIS — I5032 Chronic diastolic (congestive) heart failure: Secondary | ICD-10-CM

## 2019-05-28 NOTE — Telephone Encounter (Signed)
[place] Visit Follow Up Request   Date of Request ([place]):  May 28, 2019  Requesting Provider:  Ermalinda Barrios, PA    Agency Requested:    [facility] Contact:  Glory Buff, NP [address] [place], [place] [address] Phone #:  [phone] Fax #:  [phone]  Patient Demographic Information: Name:  Ricardo Valencia Age:  85 y.o.   DOB:  [birth year]  MRN:  [id number]   Home visit progress note(s), lab results, telemetry strips, etc were reviewed.  Provider Recommendations: Patient needs LABS EARLY NEXT WEEK  Follow up home services requested:  Labs:  BMET, BNP, INR

## 2019-06-01 DIAGNOSIS — I4819 Other persistent atrial fibrillation: Secondary | ICD-10-CM | POA: Diagnosis not present

## 2019-06-01 DIAGNOSIS — Z7901 Long term (current) use of anticoagulants: Secondary | ICD-10-CM | POA: Diagnosis not present

## 2019-06-01 DIAGNOSIS — I5032 Chronic diastolic (congestive) heart failure: Secondary | ICD-10-CM | POA: Diagnosis not present

## 2019-06-02 ENCOUNTER — Ambulatory Visit (INDEPENDENT_AMBULATORY_CARE_PROVIDER_SITE_OTHER): Payer: Medicare Other | Admitting: Cardiology

## 2019-06-02 ENCOUNTER — Telehealth: Payer: Self-pay

## 2019-06-02 DIAGNOSIS — Z7901 Long term (current) use of anticoagulants: Secondary | ICD-10-CM | POA: Diagnosis not present

## 2019-06-02 DIAGNOSIS — I5032 Chronic diastolic (congestive) heart failure: Secondary | ICD-10-CM

## 2019-06-02 DIAGNOSIS — I4819 Other persistent atrial fibrillation: Secondary | ICD-10-CM

## 2019-06-02 LAB — PROTIME-INR
INR: 1.7 — ABNORMAL HIGH (ref 0.8–1.2)
Prothrombin Time: 17.5 s — ABNORMAL HIGH (ref 9.1–12.0)

## 2019-06-02 LAB — BASIC METABOLIC PANEL
BUN/Creatinine Ratio: 9 — ABNORMAL LOW (ref 10–24)
BUN: 15 mg/dL (ref 8–27)
CO2: 24 mmol/L (ref 20–29)
Calcium: 7.8 mg/dL — ABNORMAL LOW (ref 8.6–10.2)
Chloride: 102 mmol/L (ref 96–106)
Creatinine, Ser: 1.72 mg/dL — ABNORMAL HIGH (ref 0.76–1.27)
GFR calc Af Amer: 40 mL/min/{1.73_m2} — ABNORMAL LOW (ref >59)
GFR calc non Af Amer: 35 mL/min/{1.73_m2} — ABNORMAL LOW (ref >59)
Glucose: 105 mg/dL — ABNORMAL HIGH (ref 65–99)
Potassium: 4.4 mmol/L (ref 3.5–5.2)
Sodium: 139 mmol/L (ref 134–144)

## 2019-06-02 LAB — PRO B NATRIURETIC PEPTIDE: NT-Pro BNP: 2565 pg/mL — ABNORMAL HIGH (ref 0–486)

## 2019-06-02 NOTE — Telephone Encounter (Signed)
Will start an Anticoagulation Encounter to address the INR.

## 2019-06-02 NOTE — Telephone Encounter (Signed)
[facility] Visit Follow Up Request   Date of Request (Ag[place]):  June 02, 2019  Requesting Provider:  Ermalinda Barrios, PA  Agency Requested:    [facility] Contact:  Glory Buff, NP [address] [place], [place] [address] Phone #:  [phone] Fax #:  [phone]  Patient Demographic Information: Name:  Ricardo Valencia Age:  85 y.o.   DOB:  1928-11-29  MRN:  [id number]   Home visit progress note(s), lab results, telemetry strips, etc were reviewed.  Provider Recommendations: Notes recorded by Drue Novel I, RN on 06/02/2019 at 10:12 AM EDT  The patient's wife (DPR on file) has been notified of the result and recommendations to increase lasix to 80 mg QD x 3 days and then go back to 60 mg QD. They will have PCP address hypocalcemia. They are aware that they should get a call from coumadin clinic regarding his INR and recommendations regarding dose. Per Ermalinda Barrios, patient will need to have repeat BMET and BNP next week. Will arrange for Home Health to go out. All questions (if any) were answered.  Cleon Gustin, RN 06/02/2019 10:06 AM   ------   Notes recorded by Imogene Burn, PA-C on 06/02/2019 at 7:57 AM EDT  Kidney function improved but CHF marker elevated. Ask about weight and edema. Would increase lasix 80 mg daily for 3 days then down to 60 mg daily. Have PCP address low calcium. Coumadin clinic to address INR.  Follow up home services requested:  Labs:  BMET and BNP NEXT WEEK

## 2019-06-02 NOTE — Telephone Encounter (Signed)
Spoke with Coumadin RN and Ermalinda Barrios, PA and patient will have INR drawn by home health next week with his BMET and BNP.

## 2019-06-02 NOTE — Patient Instructions (Addendum)
Description   9/15-Spoke with wife and pt and advised pt to take 1 tablet today, then start taking 1 tablet daily except 1/2 tablet on Tuesdays, Thursdays, and Sundays. Recheck INR in 1 week when remote health goes back out-order sent today. Call us with any medication changes or concern [phone] Coumadin Clinic, Main # [phone].

## 2019-06-02 NOTE — Addendum Note (Signed)
Addended by: Drue Novel I on: 06/02/2019 03:04 PM   Modules accepted: Orders

## 2019-06-08 ENCOUNTER — Other Ambulatory Visit: Payer: Self-pay | Admitting: Cardiovascular Disease

## 2019-06-08 NOTE — Telephone Encounter (Signed)
[facility] Drug-Drug: warfarin and amiodarone Amiodarone may inhibit hepatic metabolism and increase the anticoagulant effect of Anticoagulants. Bleeding may occur. Details     warfarin (COUMADIN) 5 MG tablet [Pharmacy Med Name: Warfarin Sodium 5 MG Oral Tablet] Prescription. Reordered. Long-term. amiodarone (PACERONE) 200 MG tablet Prescription. Active. Long-term. Discontinue Very High Drug-Drug: warfarin and primidone The hypoprothrombinemic effect of Anticoagulants may be decreased by Barbiturates. Details    Needs a pharmd review

## 2019-06-11 ENCOUNTER — Telehealth: Payer: Self-pay | Admitting: Family Medicine

## 2019-06-11 NOTE — Telephone Encounter (Signed)
Needs TSH (dx=hypothyroidism), lipids (dx= hyperlipidemia), and hepatic profile

## 2019-06-11 NOTE — Telephone Encounter (Signed)
Called and spoke to Mrs. Spivey and informed her that Dr. [name] needed additional lab work.  Attempted to schedule pt but he has not left home since Covid started.  Was informed that cardiology is having a nurse come from [facility] to draw labs.  Asking if Dr. [name] can send orders. Advised that I will send a message and if approved than I will have Jinny Blossom send an order.

## 2019-06-18 ENCOUNTER — Telehealth: Payer: Self-pay | Admitting: *Deleted

## 2019-06-18 ENCOUNTER — Other Ambulatory Visit: Payer: Self-pay

## 2019-06-18 NOTE — Telephone Encounter (Signed)
Patient wife called to say that her husband refuse to leave the house and that she was under the impression that a home care nurse was coming to get his blood to have his INR and other blood work done. Wife can be reached at Ph# [phone]

## 2019-06-18 NOTE — Telephone Encounter (Signed)
Wife called in reference to the pt having his INR checked and the pt is overdue as he was due to have INR done on 06/10/2019. We do not draw the INR for the pt as she thought it was our Nurses that had been coming out to see the pt, explained to her that it is not [place] that come out.  Orders were sent on 06/02/2019 via fax to a [facility] that is being used for home visits. Will follow up and update pt/wife once info received.

## 2019-06-18 NOTE — Patient Outreach (Signed)
[place] [facility]) Care Management  06/18/2019  Ricardo Valencia [birth year] [id number]    1st outreach to the patient for initial assessment.  HIPAA verified.  Introduced myself and explained to the patient health coach role and [facility] services.  He states that he feels he does not need any education for his CHF.  He states that his wife handles his medications and he takes them daily.  He visits his doctor regularly and he has a nurse to come to his home for checks and draw blood.  He is in the process of trying to get a flu shot.  He states that he basically stays in the home due to covid.    Plan:  RN Health Coach will close the case at this time.  The patient states all of his needs at met.  I will sent him a letter and pamphlet for future reference.  Lazaro Arms RN, BSN, [place] [facility] Dial:  [phone]  Fax: [phone]

## 2019-06-19 NOTE — Telephone Encounter (Addendum)
Left message for [facility] to call back regarding the orders that were placed on 06/02/2019 by [place] RN for labwork which includes an INR, BMET, and BNP. Will await a call back.    At 115pm, spoke with Ricardo Valencia and she stated she did not have any other orders for the pt to have any labwork drawn. Clarified that she did not have the orders from 06/02/2019 and she stated the fax number has changed so that may be the issue and she gave me the new fax number, which is [phone] and I electronically faxed them and manually faxed as well. She apologized for the delay and stated she would have someone out on Monday to obtain the labs-BMET, BNP, and INR. Updated [place] RN with the info as well and let her know the fax number had changed.  Spoke with the pt's wife Ricardo Valencia and updated her that they plan to have the pt's lab drawn on Monday, 06/22/2019.

## 2019-06-22 DIAGNOSIS — I5032 Chronic diastolic (congestive) heart failure: Secondary | ICD-10-CM | POA: Diagnosis not present

## 2019-06-22 DIAGNOSIS — I4819 Other persistent atrial fibrillation: Secondary | ICD-10-CM | POA: Diagnosis not present

## 2019-06-23 ENCOUNTER — Ambulatory Visit (INDEPENDENT_AMBULATORY_CARE_PROVIDER_SITE_OTHER): Payer: Medicare Other | Admitting: Cardiology

## 2019-06-23 ENCOUNTER — Telehealth: Payer: Self-pay | Admitting: Pharmacist

## 2019-06-23 DIAGNOSIS — Z7901 Long term (current) use of anticoagulants: Secondary | ICD-10-CM

## 2019-06-23 LAB — BASIC METABOLIC PANEL
BUN/Creatinine Ratio: 8 — ABNORMAL LOW (ref 10–24)
BUN: 14 mg/dL (ref 8–27)
CO2: 23 mmol/L (ref 20–29)
Calcium: 7.5 mg/dL — ABNORMAL LOW (ref 8.6–10.2)
Chloride: 102 mmol/L (ref 96–106)
Creatinine, Ser: 1.79 mg/dL — ABNORMAL HIGH (ref 0.76–1.27)
GFR calc Af Amer: 39 mL/min/{1.73_m2} — ABNORMAL LOW (ref >59)
GFR calc non Af Amer: 33 mL/min/{1.73_m2} — ABNORMAL LOW (ref >59)
Glucose: 117 mg/dL — ABNORMAL HIGH (ref 65–99)
Potassium: 3.9 mmol/L (ref 3.5–5.2)
Sodium: 138 mmol/L (ref 134–144)

## 2019-06-23 LAB — PRO B NATRIURETIC PEPTIDE: NT-Pro BNP: 3107 pg/mL — ABNORMAL HIGH (ref 0–486)

## 2019-06-23 LAB — PROTIME-INR
INR: 1.7 — ABNORMAL HIGH (ref 0.9–1.2)
Prothrombin Time: 17.9 s — ABNORMAL HIGH (ref 9.1–12.0)

## 2019-06-23 NOTE — Telephone Encounter (Signed)
  [place] Visit Follow Up Request   Date of Request ([place]):  June 23, 2019  Requesting Provider:  Ermalinda Barrios, PA    Agency Requested:    [facility] Contact:  Glory Buff, NP [address] [place], [place] [address] Phone #:  [phone] Fax #:  [phone]  Patient Demographic Information: Name:  Ricardo Valencia Age:  85 y.o.   DOB:  September 16, 1929  MRN:  [id number]   Home visit progress note(s), lab results, telemetry strips, etc were reviewed.  Provider Recommendations: Patient needs labs drawn on 07/06/19  Follow up home services requested:  Labs: INR

## 2019-06-23 NOTE — Progress Notes (Signed)
Home visit for Pt/INR & Pro BNP draw.  Ricardo Valencia had no complaints or requests and stated he was doing well other than a bruise to his RT hand (dominant hand) that he aquired when pulling weeds last week.  Stated is was not healing as quickly as he hoped, but he is aware he uses his dominant hand all day.  He was able to use the hand, wiggle fingers, and no s/s of infection were seen on assessment.  He stated he will run it by his MD at his next appt.

## 2019-06-23 NOTE — Addendum Note (Signed)
Addended by: SUPPLE, MEGAN E on: 06/23/2019 01:33 PM   Modules accepted: Orders

## 2019-06-24 ENCOUNTER — Telehealth: Payer: Self-pay

## 2019-06-24 ENCOUNTER — Telehealth: Payer: Self-pay | Admitting: *Deleted

## 2019-06-24 ENCOUNTER — Telehealth (INDEPENDENT_AMBULATORY_CARE_PROVIDER_SITE_OTHER): Payer: Medicare Other | Admitting: Physician Assistant

## 2019-06-24 ENCOUNTER — Other Ambulatory Visit: Payer: Self-pay

## 2019-06-24 ENCOUNTER — Encounter: Payer: Self-pay | Admitting: Physician Assistant

## 2019-06-24 VITALS — BP 116/58 | HR 61 | Ht 68.0 in | Wt 171.4 lb

## 2019-06-24 DIAGNOSIS — I5033 Acute on chronic diastolic (congestive) heart failure: Secondary | ICD-10-CM | POA: Diagnosis not present

## 2019-06-24 DIAGNOSIS — I4819 Other persistent atrial fibrillation: Secondary | ICD-10-CM | POA: Diagnosis not present

## 2019-06-24 DIAGNOSIS — R001 Bradycardia, unspecified: Secondary | ICD-10-CM

## 2019-06-24 DIAGNOSIS — I2510 Atherosclerotic heart disease of native coronary artery without angina pectoris: Secondary | ICD-10-CM | POA: Diagnosis not present

## 2019-06-24 DIAGNOSIS — I25812 Atherosclerosis of bypass graft of coronary artery of transplanted heart without angina pectoris: Secondary | ICD-10-CM

## 2019-06-24 DIAGNOSIS — Z9181 History of falling: Secondary | ICD-10-CM | POA: Diagnosis not present

## 2019-06-24 DIAGNOSIS — W19XXXD Unspecified fall, subsequent encounter: Secondary | ICD-10-CM

## 2019-06-24 MED ORDER — LISINOPRIL 2.5 MG PO TABS: 2.5000 mg | ORAL_TABLET | Freq: Every day | ORAL | 3 refills | Status: DC

## 2019-06-24 NOTE — Telephone Encounter (Signed)
[place] Visit Follow Up Request   Date of Request ([place]):  June 24, 2019  Requesting Provider:  Ermalinda Barrios, PA    Agency Requested:    [facility] Contact:  Glory Buff, NP [address] [place], [place] [address] Phone #:  [phone] Fax #:  [phone]  Patient Demographic Information: Name:  Ricardo Valencia Age:  85 y.o.   DOB:  20-Dec-1928  MRN:  [id number]   Home visit progress note(s), lab results, telemetry strips, etc were reviewed.  Provider Recommendations: Lab draw in 1 month  Follow up home services requested:  Labs:  BMET in [place]

## 2019-06-24 NOTE — Patient Instructions (Addendum)
Medication Instructions:  Your physician recommends that you continue on your current medications as directed. Please refer to the Current Medication list given to you today.  If you need a refill on your cardiac medications before your next appointment, please call your pharmacy.   Lab work: We will arrange for Home Health to come out and draw a BMET in 1 month  If you have labs (blood work) drawn today and your tests are completely normal, you will receive your results only by: Marland Kitchen MyChart Message (if you have MyChart) OR . A paper copy in the mail If you have any lab test that is abnormal or we need to change your treatment, we will call you to review the results.  Testing/Procedures: None ordered  Follow-Up: Follow up with Ermalinda Barrios, PA via VIRTUAL VISIT on 08/19/19 at 1:30 PM  Any Other Special Instructions Will Be Listed Below (If Applicable).

## 2019-06-24 NOTE — Progress Notes (Signed)
Virtual Visit via Video Note   This visit type was conducted due to national recommendations for restrictions regarding the COVID-19 Pandemic (e.g. social distancing) in an effort to limit this patient's exposure and mitigate transmission in our community.  Due to his co-morbid illnesses, this patient is at least at moderate risk for complications without adequate follow up.  This format is felt to be most appropriate for this patient at this time.  All issues noted in this document were discussed and addressed.  A limited physical exam was performed with this format.  Please refer to the patient's chart for his consent to telehealth for [facility].   Date:  06/24/2019   ID:  Ricardo Valencia, DOB 11-Sep-1929, MRN [id number]  Patient Location: Home Provider Location: Home  PCP:  Eulas Post, MD  Cardiologist:  Lauree Chandler, MD   Electrophysiologist:  None   Evaluation Performed:  Follow-Up Visit  Chief Complaint:  F/u  History of Present Illness:    Ricardo Valencia is a 85 y.o. male with persistent atrial fibrillation, CAD status post DES to the mid LAD 2016, echo at that time LVEF 25 to 30% improved to 45 to 50% atrial fibrillation 2016 cardioverted back to normal sinus rhythm.  Patient had recurrent atrial fibrillation and was on Tikosyn but stopped because of prolonged QT interval and now is on amiodarone. Last echo 06/2017 EF 55 to 60% with basal inferior akinesis mildly dilated RV and mildly decreased RV systolic function.    Patient was last seen by Dr. Aundra Dubin in the heart failure clinic 11/2017 and was doing well.  No aspirin given stable CAD and Coumadin use.  He suspected OSA but the patient did not want to do a sleep study.  I have stopped his toprol and decreased amiodarone due to slow pulse and multiple falls resulting in L3 lumbar fracture and rib fractures. He's also struggled with diastolic CHF. Weight today 171 same as 05/20/19. BNP 3107 06/22/19 -higher  than it's been. Crt 1.79. I increased lasix 80 mg 3 days then back to 60 mg daily. Patient denies shortness of breath, change in weight or edema.No more falls and pulse has been stable.  The patient does not have symptoms concerning for COVID-19 infection (fever, chills, cough, or new shortness of breath).    Past Medical History:  Diagnosis Date  . CAD (coronary artery disease)    DES to mid LAD 11/10/2014  . Chronic systolic heart failure ([place])   . Hay fever    hay fever, allergies  . History of echocardiogram    Echo 3/17: EF 40%, diff HK, trivial AI, trivial MR, mod LAE, mild reduced RVSF, mod RAE  . Hyperlipidemia   . Hypertension   . Persistent atrial fibrillation [facility])    Past Surgical History:  Procedure Laterality Date  . APPENDECTOMY  1947  . CARDIOVERSION N/A 12/30/2014   Procedure: CARDIOVERSION;  Surgeon: Jerline Pain, MD;  Location: [facility];  Service: Cardiovascular;  Laterality: N/A;  . CORONARY ANGIOPLASTY WITH STENT PLACEMENT    . LEFT HEART CATHETERIZATION WITH CORONARY ANGIOGRAM N/A 11/10/2014   Procedure: LEFT HEART CATHETERIZATION WITH CORONARY ANGIOGRAM;  Surgeon: Wellington Hampshire, MD;  Location: [facility] CATH LAB;  Service: Cardiovascular;  Laterality: N/A;  . PERCUTANEOUS CORONARY STENT INTERVENTION (PCI-S)  11/10/2014   Procedure: PERCUTANEOUS CORONARY STENT INTERVENTION (PCI-S);  Surgeon: Wellington Hampshire, MD;  Location: [facility] CATH LAB;  Service: Cardiovascular;;     Current Meds  Medication  Sig  . amiodarone (PACERONE) 200 MG tablet Take 0.5 tablets (100 mg total) by mouth daily.  . Ascorbic Acid (VITAMIN C) 1000 MG tablet Take 1,000 mg by mouth daily.  Marland Kitchen atorvastatin (LIPITOR) 80 MG tablet Take 1 tablet (80 mg total) by mouth daily at 6 PM.  . calcium carbonate (TUMS) 500 MG chewable tablet Chew 1 tablet (200 mg of elemental calcium total) by mouth daily.  . furosemide (LASIX) 40 MG tablet Take 1.5 tablets (60 mg total) by mouth daily.  Marland Kitchen gabapentin  (NEURONTIN) 300 MG capsule Patient takes 300 mg at bedtime and then takes another 300 mg during the night  . levothyroxine (SYNTHROID, LEVOTHROID) 25 MCG tablet Take 1 tablet (25 mcg total) by mouth daily before breakfast.  . lisinopril (PRINIVIL,ZESTRIL) 2.5 MG tablet Take 2.5 mg by mouth daily.  . nitroGLYCERIN (NITROSTAT) 0.4 MG SL tablet Place 1 tablet (0.4 mg total) under the tongue every 5 (five) minutes x 3 doses as needed for chest pain.  . potassium chloride (K-DUR) 10 MEQ tablet Take 1 tablet by mouth once daily  . primidone (MYSOLINE) 50 MG tablet Take 1 tablet (50 mg total) by mouth 2 (two) times daily. Take at 10am and 6pm  . warfarin (COUMADIN) 5 MG tablet TAKE AS DIRECTED BY  COUMADIN  CLINIC     Allergies:   Patient has no known allergies.   Social History   Tobacco Use  . Smoking status: Never Smoker  . Smokeless tobacco: Never Used  . Tobacco comment: "smoked when I was 22"  Substance Use Topics  . Alcohol use: Yes    Alcohol/week: 7.0 - 14.0 standard drinks    Types: 7 - 14 Cans of beer per week    Comment: occ  . Drug use: No     Family Hx: The patient's family history includes Cancer in his father and mother.  ROS:   Please see the history of present illness.      All other systems reviewed and are negative.   Prior CV studies:   The following studies were reviewed today:   Echo 11/2018  IMPRESSIONS      1. The left ventricle has normal systolic function, with an ejection fraction of 55-60%. The cavity size was normal. Left ventricular diastolic Doppler parameters are consistent with pseudonormalization.  2. The right ventricle has normal systolic function. The cavity was normal. There is no increase in right ventricular wall thickness.  3. Left atrial size was moderately dilated.  4. The mitral valve is normal in structure. Mild thickening of the mitral valve leaflet. Mild calcification of the mitral valve leaflet.  5. The tricuspid valve is normal in  structure.  6. The aortic valve has an indeterminant number of cusps Moderate thickening of the aortic valve Moderate calcification of the aortic valve. Aortic valve regurgitation is mild by color flow Doppler. mild stenosis of the aortic valve.  7. The pulmonic valve was grossly normal. Pulmonic valve regurgitation is mild by color flow Doppler.  8. There is mild dilatation of the aortic root measuring 39 mm.   FINDINGS  Left Ventricle: The left ventricle has normal systolic function, with an ejection fraction of 55-60%. The cavity size was normal. There is no increase in left ventricular wall thickness. Left ventricular diastolic Doppler parameters are consistent with  pseudonormalization Right Ventricle: The right ventricle has normal systolic function. The cavity was normal. There is no increase in right ventricular wall thickness. Left Atrium: left atrial size  was moderately dilated Right Atrium: right atrial size was normal in size. Right atrial pressure is estimated at 3 mmHg. Interatrial Septum: No atrial level shunt detected by color flow Doppler. Pericardium: There is no evidence of pericardial effusion. Mitral Valve: The mitral valve is normal in structure. Mild thickening of the mitral valve leaflet. Mild calcification of the mitral valve leaflet. Mitral valve regurgitation is mild by color flow Doppler. Tricuspid Valve: The tricuspid valve is normal in structure. Tricuspid valve regurgitation is mild by color flow Doppler. Aortic Valve: The aortic valve has an indeterminant number of cusps Moderate thickening of the aortic valve Moderate calcification of the aortic valve. Aortic valve regurgitation is mild by color flow Doppler. There is mild stenosis of the aortic valve,  with a calculated valve area of 1.93 cm. Pulmonic Valve: The pulmonic valve was grossly normal. Pulmonic valve regurgitation is mild by color flow Doppler. Aorta: There is mild dilatation of the aortic root  measuring 39 mm. Venous: The inferior vena cava is normal in size with greater than 50% respiratory variability. Compared to previous exam: 06/27/17 EF 55-60%.         Labs/Other Tests and Data Reviewed:    EKG:  No ECG reviewed.  Recent Labs: 12/08/2018: ALT 109 02/25/2019: Hemoglobin 10.9; Platelets 118; TSH 9.230 06/22/2019: BUN 14; Creatinine, Ser 1.79; NT-Pro BNP 3,107; Potassium 3.9; Sodium 138   Recent Lipid Panel Lab Results  Component Value Date/Time   CHOL 178 10/01/2017 01:17 PM   TRIG 108 10/01/2017 01:17 PM   HDL 63 10/01/2017 01:17 PM   CHOLHDL 2.8 10/01/2017 01:17 PM   LDLCALC 93 10/01/2017 01:17 PM   LDLDIRECT 191.1 01/04/2011 12:18 PM    Wt Readings from Last 3 Encounters:  06/24/19 171 lb 6.4 oz (77.7 kg)  05/20/19 171 lb (77.6 kg)  05/14/19 172 lb 4.8 oz (78.2 kg)     Objective:    Vital Signs:  BP (!) 116/58   Pulse 61   Ht 5\' 8"  (1.727 m)   Wt 171 lb 6.4 oz (77.7 kg)   BMI 26.06 kg/m    VITAL SIGNS:  reviewed GEN:  no acute distress RESPIRATORY:  normal respiratory effort, symmetric expansion CARDIOVASCULAR:  lower extremity edema noted  ASSESSMENT & PLAN:    1. Acute on chronic diastolic CHF with increase BNP over 3000 lasix increased 80 mg daily for 3 days then 60 mg daily starting tomorrow. Overall stable without symptoms.  2. Persistent Afib toprol stopped and Amio decreased because of bradycardia pulse stable. On coumadin INR 1.7 adjusted by coumadin clinic 3. Bradycardia-see above 4. CAD no angina 5. Falls with fractures 11/2018 and 01/2019 watch closely on coumadin-no falls since meds adjusted  COVID-19 Education: The signs and symptoms of COVID-19 were discussed with the patient and how to seek care for testing (follow up with PCP or arrange E-visit).   The importance of social distancing was discussed today.  Time:   Today, I have spent 12:20 minutes with the patient with telehealth technology discussing the above problems.      Medication Adjustments/Labs and Tests Ordered: Current medicines are reviewed at length with the patient today.  Concerns regarding medicines are outlined above.   Tests Ordered: No orders of the defined types were placed in this encounter.   Medication Changes: No orders of the defined types were placed in this encounter.   Follow Up:  Virtual Visit in 2 month(s) Ermalinda Barrios PAC  Signed, Ermalinda Barrios, PA-C  06/24/2019  1:01 PM    [facility]

## 2019-06-24 NOTE — Telephone Encounter (Signed)
Called pt and wife because they had a question about the pt's Warfarin dose. Spoke with the pt's wife and husband was in the background and  I stated I wanted to go over dosing instructions for the pt's Warfarin and the wife stated good. I recalled to them that the pt's INR was 1.7 on 10/69/2020 and Megan spoke with them and she confirmed that was right. Then, I stated that the pt was to take 1 tablet yesterday and she said yes he got it, I inquired about the dose today and she said wait and he said 1 tablet and then the wife stated the others dose and confirmed they were correct. Advised to ensure pt gets the doses she called out to me as this is very important to ensuring we have him on the right dose and to prevent clots or a stroke and they both verbalized understanding.

## 2019-07-05 ENCOUNTER — Other Ambulatory Visit: Payer: Self-pay | Admitting: Cardiovascular Disease

## 2019-07-06 ENCOUNTER — Other Ambulatory Visit: Payer: Self-pay | Admitting: Physician Assistant

## 2019-07-06 DIAGNOSIS — I4819 Other persistent atrial fibrillation: Secondary | ICD-10-CM | POA: Diagnosis not present

## 2019-07-06 NOTE — Progress Notes (Signed)
Home visit on behalf of [facility].  PT/INR drawn and taken to the [facility] on [address].   Flu shot given to LT deltoid. [place] [id number] Lot # [id number] Exp. 03/16/20 Archie Endo

## 2019-07-07 LAB — PROTIME-INR
INR: 1.8 — ABNORMAL HIGH (ref 0.9–1.2)
Prothrombin Time: 18.9 s — ABNORMAL HIGH (ref 9.1–12.0)

## 2019-07-09 ENCOUNTER — Telehealth: Payer: Self-pay | Admitting: Pharmacist

## 2019-07-09 ENCOUNTER — Ambulatory Visit (INDEPENDENT_AMBULATORY_CARE_PROVIDER_SITE_OTHER): Payer: Medicare Other

## 2019-07-09 DIAGNOSIS — Z7901 Long term (current) use of anticoagulants: Secondary | ICD-10-CM

## 2019-07-09 DIAGNOSIS — I480 Paroxysmal atrial fibrillation: Secondary | ICD-10-CM | POA: Diagnosis not present

## 2019-07-09 NOTE — Patient Instructions (Signed)
Description   Spoke with wife and pt and advised pt to start taking 1 tablet daily except 1/2 tablet on Sundays and Thursdays. Recheck INR in 2 weeks when [facility] goes back out - order sent today fax [phone]. Call us with any medication changes or concern [phone] [facility], Main # [phone].

## 2019-07-09 NOTE — Telephone Encounter (Signed)
[place] Visit Follow Up Request  Date of Request ([place]): July 09, 2019  Requesting Provider:Michele Bonnell Public, [place]  Agency Requested:   [facility] Contact: Glory Buff, NP [address] [place], [place] [address] Phone #: [phone] Fax #: [phone]  Patient Demographic Information: Name:Ricardo Valencia Age:85 y.o. DOB:08/31/1929 [id number]  Home visit progress note(s), lab results, telemetry strips, etc were reviewed.  Provider Recommendations: Patient needs labs drawn on 07/20/19  Follow up home services requested:  Labs: INR

## 2019-07-20 DIAGNOSIS — Z7901 Long term (current) use of anticoagulants: Secondary | ICD-10-CM | POA: Diagnosis not present

## 2019-07-20 NOTE — Progress Notes (Signed)
Home Visit to draw labs on behalf of [facility].  Mr. Youngs had no complaints, appeared pleasant and free of distress.  His wife stated he has been doing his exercises given by PT and that his balance has improved.  She stated they will walk together around their yard where it's flat.    INR was drawn and brought to the [facility] on [address], [place].

## 2019-07-21 ENCOUNTER — Telehealth: Payer: Self-pay | Admitting: Pharmacist

## 2019-07-21 ENCOUNTER — Ambulatory Visit (INDEPENDENT_AMBULATORY_CARE_PROVIDER_SITE_OTHER): Payer: Medicare Other | Admitting: Cardiology

## 2019-07-21 DIAGNOSIS — Z7901 Long term (current) use of anticoagulants: Secondary | ICD-10-CM

## 2019-07-21 LAB — PROTIME-INR
INR: 1.8 — ABNORMAL HIGH (ref 0.9–1.2)
Prothrombin Time: 19.2 s — ABNORMAL HIGH (ref 9.1–12.0)

## 2019-07-21 NOTE — Telephone Encounter (Signed)
[place] Visit Follow Up Request  Date of Request ([place]):July 21, 2019  Requesting Provider:Michele Bonnell Public, PA  Agency Requested:   [facility] Contact: Glory Buff, NP [address] [place], [place] [address] Phone #: [phone] Fax #: [phone]  Patient Demographic Information: Name:Ricardo Valencia Age:85 y.o. DOB:[birth year] [id number]  Home visit progress note(s), lab results, telemetry strips, etc were reviewed.  Provider Recommendations: Patient needslabs drawn on 08/03/19  Follow up home services requested:  Labs: INR

## 2019-07-21 NOTE — Patient Instructions (Signed)
Description   Spoke with wife and pt and advised pt to take 1.5 tablets today, then start taking 1 tablet daily except 1/2 tablet on Sundays. Recheck INR in 2 weeks when [facility] goes back out - order sent today fax [phone]. Call us with any medication changes or concern [phone] Coumadin Clinic, Main # [phone].

## 2019-07-23 DIAGNOSIS — I5033 Acute on chronic diastolic (congestive) heart failure: Secondary | ICD-10-CM | POA: Diagnosis not present

## 2019-07-24 LAB — BASIC METABOLIC PANEL
BUN/Creatinine Ratio: 8 — ABNORMAL LOW (ref 10–24)
BUN: 15 mg/dL (ref 8–27)
CO2: 22 mmol/L (ref 20–29)
Calcium: 7.8 mg/dL — ABNORMAL LOW (ref 8.6–10.2)
Chloride: 101 mmol/L (ref 96–106)
Creatinine, Ser: 1.78 mg/dL — ABNORMAL HIGH (ref 0.76–1.27)
GFR calc Af Amer: 39 mL/min/{1.73_m2} — ABNORMAL LOW (ref >59)
GFR calc non Af Amer: 34 mL/min/{1.73_m2} — ABNORMAL LOW (ref >59)
Glucose: 90 mg/dL (ref 65–99)
Potassium: 4.8 mmol/L (ref 3.5–5.2)
Sodium: 133 mmol/L — ABNORMAL LOW (ref 134–144)

## 2019-07-26 ENCOUNTER — Other Ambulatory Visit: Payer: Self-pay | Admitting: Family Medicine

## 2019-07-27 ENCOUNTER — Other Ambulatory Visit: Payer: Self-pay | Admitting: Cardiovascular Disease

## 2019-07-28 NOTE — Telephone Encounter (Signed)
Would refill for Gabapentin 300 mg po bid #180 with 3 refills.

## 2019-07-30 ENCOUNTER — Other Ambulatory Visit: Payer: Self-pay | Admitting: Family Medicine

## 2019-07-30 NOTE — Telephone Encounter (Signed)
Okay to refill? Was prescribed by a historical provider

## 2019-07-30 NOTE — Telephone Encounter (Signed)
Copied from [place] [phone]. Topic: Quick Communication - Rx Refill/Question >> Jul 30, 2019 10:32 AM Yvette Rack wrote: Medication: gabapentin (NEURONTIN) 300 MG capsule  Has the patient contacted their pharmacy? yes   Preferred Pharmacy (with phone number or street name): [place], [place] - [id number] [phone] (Phone) [phone] (Fax)  Agent: Please be advised that RX refills may take up to 3 business days. We ask that you follow-up with your pharmacy.

## 2019-07-30 NOTE — Telephone Encounter (Signed)
Requested medication (s) are due for refill today: yes  Requested medication (s) are on the active medication list: yes  Last refill:  06/24/2019  Future visit scheduled: no  Notes to clinic: last filled by historical provider Review for refill   Requested Prescriptions  Pending Prescriptions Disp Refills   gabapentin (NEURONTIN) 300 MG capsule       Sig: Patient takes 300 mg at bedtime and then takes another 300 mg during the night     Neurology: Anticonvulsants - gabapentin Passed - 07/30/2019 10:38 AM      Passed - Valid encounter within last 12 months    Recent Outpatient Visits          5 months ago Essential hypertension   Therapist, music at [facility], Alinda Sierras, MD   5 months ago Cellulitis of right forearm   Therapist, music at [facility], Alinda Sierras, MD   6 months ago Compression fracture of L3 vertebra with routine healing, subsequent encounter   Therapist, music at [facility], Alinda Sierras, MD   7 months ago Compression fracture of L3 lumbar vertebra, closed, initial encounter ([place])   Therapist, music at [facility], Alinda Sierras, MD   8 months ago Postherpetic neuralgia   Therapist, music at [facility], Alinda Sierras, MD      Future Appointments            In 2 weeks Bonnell Public, Jennye Moccasin, PA-C [place]

## 2019-07-31 NOTE — Telephone Encounter (Signed)
Looks like this was refilled this morning?

## 2019-08-03 DIAGNOSIS — Z7901 Long term (current) use of anticoagulants: Secondary | ICD-10-CM | POA: Diagnosis not present

## 2019-08-04 ENCOUNTER — Ambulatory Visit (INDEPENDENT_AMBULATORY_CARE_PROVIDER_SITE_OTHER): Payer: Medicare Other | Admitting: Pharmacist

## 2019-08-04 ENCOUNTER — Telehealth: Payer: Self-pay | Admitting: Pharmacist

## 2019-08-04 DIAGNOSIS — Z5181 Encounter for therapeutic drug level monitoring: Secondary | ICD-10-CM

## 2019-08-04 DIAGNOSIS — Z7901 Long term (current) use of anticoagulants: Secondary | ICD-10-CM

## 2019-08-04 LAB — PROTIME-INR
INR: 2.8 — ABNORMAL HIGH (ref 0.9–1.2)
Prothrombin Time: 28.7 s — ABNORMAL HIGH (ref 9.1–12.0)

## 2019-08-04 NOTE — Telephone Encounter (Deleted)
[place] Visit Initial Request  Date of Request ([place]):  August 04, 2019  Requesting Provider: Lauree Chandler, MD    Agency Requested:    [facility] Contact:  Joen Laura, [place], [place], [place], [place]  [id number] [email or web address]  Phone #: [phone] Fax #: [phone]  Patient Demographic Information: Name:  Ricardo Valencia Age:  85 y.o.   DOB:  Jan 13, 1929  MRN:  [id number]   Address:   [place] [address]   Phone Numbers:   Home Phone [phone]  Mobile [phone]     Emergency Contact Information on File:   Contact Information    Name Relation Home Work Mobile   Ricardo Valencia, Ricardo Valencia [id number]  [phone]      The above family members may be contacted for information on this patient (review DPR on file):  Yes    Patient Clinical Information:  Primary Care Provider:  Eulas Post, MD  Primary Cardiologist:  Lauree Chandler, MD  Primary Electrophysiologist:  None   Past Medical Hx: Ricardo Valencia  has a past medical history of CAD (coronary artery disease), Chronic systolic heart failure ([place]), Hay fever, History of echocardiogram, Hyperlipidemia, Hypertension, and Persistent atrial fibrillation ([place]).   Allergies: He has No Known Allergies.   Medications: Current Outpatient Medications on File Prior to Visit  Medication Sig  . amiodarone (PACERONE) 200 MG tablet Take 0.5 tablets (100 mg total) by mouth daily.  . Ascorbic Acid (VITAMIN C) 1000 MG tablet Take 1,000 mg by mouth daily.  Marland Kitchen atorvastatin (LIPITOR) 80 MG tablet TAKE 1 TABLET BY MOUTH ONCE DAILY AT  6PM  . calcium carbonate (TUMS) 500 MG chewable tablet Chew 1 tablet (200 mg of elemental calcium total) by mouth daily.  . furosemide (LASIX) 40 MG tablet Take 1.5 tablets (60 mg total) by mouth daily.  Marland Kitchen gabapentin (NEURONTIN) 300 MG capsule Take 1 capsule (300 mg total) by  mouth 2 (two) times daily.  Marland Kitchen levothyroxine (SYNTHROID, LEVOTHROID) 25 MCG tablet Take 1 tablet (25 mcg total) by mouth daily before breakfast.  . lisinopril (ZESTRIL) 2.5 MG tablet Take 1 tablet (2.5 mg total) by mouth daily.  . nitroGLYCERIN (NITROSTAT) 0.4 MG SL tablet Place 1 tablet (0.4 mg total) under the tongue every 5 (five) minutes x 3 doses as needed for chest pain.  . potassium chloride (K-DUR) 10 MEQ tablet Take 1 tablet by mouth once daily  . primidone (MYSOLINE) 50 MG tablet Take 1 tablet (50 mg total) by mouth 2 (two) times daily. Take at 10am and 6pm  . warfarin (COUMADIN) 5 MG tablet TAKE AS DIRECTED BY  COUMADIN  CLINIC   No current facility-administered medications on file prior to visit.      Social Hx: He  reports that he has never smoked. He has never used smokeless tobacco. He reports current alcohol use of about 7.0 - 14.0 standard drinks of alcohol per week. He reports that he does not use drugs.    Diagnosis/Reason for Visit:   Draw INR labs  Services Requested:  Labs:  INR

## 2019-08-04 NOTE — Telephone Encounter (Signed)
[facility] Visit Follow Up Request   Date of Request ([place]):  August 04, 2019  Requesting Provider:  Ermalinda Barrios    Agency Requested:    [facility] Contact:  Glory Buff, NP [address] [place], [place] [address] Phone #:  [phone] Fax #:  [phone]  Patient Demographic Information: Name:  Ricardo Valencia Age:  85 y.o.   DOB:  1928-09-25  MRN:  [id number]   Home visit progress note(s), lab results, telemetry strips, etc were reviewed.  Provider Recommendations: Patient needs an INR drawn on 08/24/2019  Follow up home services requested:  Labs:  INR

## 2019-08-04 NOTE — Telephone Encounter (Signed)
[facility] Visit Initial Request  Date of Request ([place]):  August 04, 2019  Requesting Provider:  Ermalinda Barrios    Agency Requested:    [facility] Contact:  Glory Buff, NP [address] [place], [place] [address] Phone #:  [phone] Fax #:  [phone]  Patient Demographic Information: Name:  Ricardo Valencia Age:  85 y.o.   DOB:  06/19/1929  MRN:  [id number]   Address:   [place] [address]   Phone Numbers:   Home Phone [phone]  Mobile [phone]     Emergency Contact Information on File:   Contact Information    Name Relation Home Work Mobile   Ricardo Valencia [id number]  [phone]      The above family members may be contacted for information on this patient (review DPR on file):  Yes    Patient Clinical Information:  Primary Care Provider:  Eulas Post, MD  Primary Cardiologist:  Lauree Chandler, MD  Primary Electrophysiologist:  None   Past Medical Hx: Ricardo Valencia  has a past medical history of CAD (coronary artery disease), Chronic systolic heart failure ([place]), Hay fever, History of echocardiogram, Hyperlipidemia, Hypertension, and Persistent atrial fibrillation ([place]).   Allergies: He has No Known Allergies.   Medications: Current Outpatient Medications on File Prior to Visit  Medication Sig  . amiodarone (PACERONE) 200 MG tablet Take 0.5 tablets (100 mg total) by mouth daily.  . Ascorbic Acid (VITAMIN C) 1000 MG tablet Take 1,000 mg by mouth daily.  Marland Kitchen atorvastatin (LIPITOR) 80 MG tablet TAKE 1 TABLET BY MOUTH ONCE DAILY AT  6PM  . calcium carbonate (TUMS) 500 MG chewable tablet Chew 1 tablet (200 mg of elemental calcium total) by mouth daily.  . furosemide (LASIX) 40 MG tablet Take 1.5 tablets (60 mg total) by mouth daily.  Marland Kitchen gabapentin (NEURONTIN) 300 MG capsule Take 1 capsule (300 mg total) by mouth 2 (two) times daily.  Marland Kitchen levothyroxine  (SYNTHROID, LEVOTHROID) 25 MCG tablet Take 1 tablet (25 mcg total) by mouth daily before breakfast.  . lisinopril (ZESTRIL) 2.5 MG tablet Take 1 tablet (2.5 mg total) by mouth daily.  . nitroGLYCERIN (NITROSTAT) 0.4 MG SL tablet Place 1 tablet (0.4 mg total) under the tongue every 5 (five) minutes x 3 doses as needed for chest pain.  . potassium chloride (K-DUR) 10 MEQ tablet Take 1 tablet by mouth once daily  . primidone (MYSOLINE) 50 MG tablet Take 1 tablet (50 mg total) by mouth 2 (two) times daily. Take at 10am and 6pm  . warfarin (COUMADIN) 5 MG tablet TAKE AS DIRECTED BY  COUMADIN  CLINIC   No current facility-administered medications on file prior to visit.      Social Hx: He  reports that he has never smoked. He has never used smokeless tobacco. He reports current alcohol use of about 7.0 - 14.0 standard drinks of alcohol per week. He reports that he does not use drugs.    Diagnosis/Reason for Visit:   Atrial fibrillation with rapid ventricular response ([place]) (Resolved) [I48.91]  Long term (current) use of anticoagulants [Z79.01] [Z79.01]  Services Requested:  Labs:  INR  # of Visits Needed/Frequency per Week: 1, labs to be drawn 08/24/2019  .

## 2019-08-04 NOTE — Progress Notes (Signed)
Home Visit on behalf of [facility] for lab draw.  Ricardo Valencia stated he was doing well but was a bit frustrated as he said he just had labs drawn a week ago and questioned why they were being drawn again.  Last labs were not PT/INR as was needed on this visit.  He was understanding.   Labs brought to the [facility] on [address]

## 2019-08-18 NOTE — Progress Notes (Signed)
Virtual Visit via Video Note   This visit type was conducted due to national recommendations for restrictions regarding the COVID-19 Pandemic (e.g. social distancing) in an effort to limit this patient's exposure and mitigate transmission in our community.  Due to his co-morbid illnesses, this patient is at least at moderate risk for complications without adequate follow up.  This format is felt to be most appropriate for this patient at this time.  All issues noted in this document were discussed and addressed.  A limited physical exam was performed with this format.  Please refer to the patient's chart for his consent to telehealth for [facility].   Date:  08/19/2019   ID:  Ricardo Valencia, DOB 09-22-1928, MRN [id number]  Patient Location: Home Provider Location: Home  PCP:  Eulas Post, MD  Cardiologist:  Lauree Chandler, MD   Electrophysiologist:  None   Evaluation Performed:  Follow-Up Visit  Chief Complaint:  f/u  History of Present Illness:    Ricardo Valencia is a 85 y.o. male with persistent atrial fibrillation, CAD status post DES to the mid LAD 2016, echo at that time LVEF 25 to 30% improved to 45 to 50% atrial fibrillation 2016 cardioverted back to normal sinus rhythm.  Patient had recurrent atrial fibrillation and was on Tikosyn but stopped because of prolonged QT interval and now is on amiodarone. Last echo 06/2017 EF 55 to 60% with basal inferior akinesis mildly dilated RV and mildly decreased RV systolic function.    Patient was last seen by Dr. Aundra Dubin in the heart failure clinic 11/2017 and was doing well.  No aspirin given stable CAD and Coumadin use.  He suspected OSA but the patient did not want to do a sleep study.   I  stopped his toprol and decreased amiodarone due to slow pulse and multiple falls resulting in L3 lumbar fracture and rib fractures. He's also struggled with diastolic CHF.  Patient's BNP has remained elevated despite lack of  symptoms.  Patient denies shortness of breath or edema. Overall doing fine. Walks around the house.    The patient does not have symptoms concerning for COVID-19 infection (fever, chills, cough, or new shortness of breath).    Past Medical History:  Diagnosis Date  . CAD (coronary artery disease)    DES to mid LAD 11/10/2014  . Chronic systolic heart failure ([place])   . Hay fever    hay fever, allergies  . History of echocardiogram    Echo 3/17: EF 40%, diff HK, trivial AI, trivial MR, mod LAE, mild reduced RVSF, mod RAE  . Hyperlipidemia   . Hypertension   . Persistent atrial fibrillation [facility])    Past Surgical History:  Procedure Laterality Date  . APPENDECTOMY  1947  . CARDIOVERSION N/A 12/30/2014   Procedure: CARDIOVERSION;  Surgeon: Jerline Pain, MD;  Location: [facility];  Service: Cardiovascular;  Laterality: N/A;  . CORONARY ANGIOPLASTY WITH STENT PLACEMENT    . LEFT HEART CATHETERIZATION WITH CORONARY ANGIOGRAM N/A 11/10/2014   Procedure: LEFT HEART CATHETERIZATION WITH CORONARY ANGIOGRAM;  Surgeon: Wellington Hampshire, MD;  Location: [facility] CATH LAB;  Service: Cardiovascular;  Laterality: N/A;  . PERCUTANEOUS CORONARY STENT INTERVENTION (PCI-S)  11/10/2014   Procedure: PERCUTANEOUS CORONARY STENT INTERVENTION (PCI-S);  Surgeon: Wellington Hampshire, MD;  Location: [facility] CATH LAB;  Service: Cardiovascular;;     Current Meds  Medication Sig  . amiodarone (PACERONE) 200 MG tablet Take 0.5 tablets (100 mg total) by mouth daily.  Marland Kitchen  Ascorbic Acid (VITAMIN C) 1000 MG tablet Take 1,000 mg by mouth daily.  Marland Kitchen atorvastatin (LIPITOR) 80 MG tablet TAKE 1 TABLET BY MOUTH ONCE DAILY AT  6PM  . calcium carbonate (TUMS) 500 MG chewable tablet Chew 1 tablet (200 mg of elemental calcium total) by mouth daily.  . furosemide (LASIX) 40 MG tablet Take 1.5 tablets (60 mg total) by mouth daily.  Marland Kitchen gabapentin (NEURONTIN) 300 MG capsule Take 1 capsule (300 mg total) by mouth 2 (two) times daily.  Marland Kitchen  levothyroxine (SYNTHROID, LEVOTHROID) 25 MCG tablet Take 1 tablet (25 mcg total) by mouth daily before breakfast.  . lisinopril (ZESTRIL) 2.5 MG tablet Take 1 tablet (2.5 mg total) by mouth daily.  . nitroGLYCERIN (NITROSTAT) 0.4 MG SL tablet Place 1 tablet (0.4 mg total) under the tongue every 5 (five) minutes x 3 doses as needed for chest pain.  . potassium chloride (K-DUR) 10 MEQ tablet Take 1 tablet by mouth once daily  . primidone (MYSOLINE) 50 MG tablet Take 1 tablet (50 mg total) by mouth 2 (two) times daily. Take at 10am and 6pm  . warfarin (COUMADIN) 5 MG tablet TAKE AS DIRECTED BY  COUMADIN  CLINIC     Allergies:   Patient has no known allergies.   Social History   Tobacco Use  . Smoking status: Never Smoker  . Smokeless tobacco: Never Used  . Tobacco comment: "smoked when I was 22"  Substance Use Topics  . Alcohol use: Yes    Alcohol/week: 7.0 - 14.0 standard drinks    Types: 7 - 14 Cans of beer per week    Comment: occ  . Drug use: No     Family Hx: The patient's family history includes Cancer in his father and mother.  ROS:   Please see the history of present illness.      All other systems reviewed and are negative.   Prior CV studies:   The following studies were reviewed today:  Echo 11/2018  IMPRESSIONS      1. The left ventricle has normal systolic function, with an ejection fraction of 55-60%. The cavity size was normal. Left ventricular diastolic Doppler parameters are consistent with pseudonormalization.  2. The right ventricle has normal systolic function. The cavity was normal. There is no increase in right ventricular wall thickness.  3. Left atrial size was moderately dilated.  4. The mitral valve is normal in structure. Mild thickening of the mitral valve leaflet. Mild calcification of the mitral valve leaflet.  5. The tricuspid valve is normal in structure.  6. The aortic valve has an indeterminant number of cusps Moderate thickening of the  aortic valve Moderate calcification of the aortic valve. Aortic valve regurgitation is mild by color flow Doppler. mild stenosis of the aortic valve.  7. The pulmonic valve was grossly normal. Pulmonic valve regurgitation is mild by color flow Doppler.  8. There is mild dilatation of the aortic root measuring 39 mm.   FINDINGS  Left Ventricle: The left ventricle has normal systolic function, with an ejection fraction of 55-60%. The cavity size was normal. There is no increase in left ventricular wall thickness. Left ventricular diastolic Doppler parameters are consistent with  pseudonormalization Right Ventricle: The right ventricle has normal systolic function. The cavity was normal. There is no increase in right ventricular wall thickness. Left Atrium: left atrial size was moderately dilated Right Atrium: right atrial size was normal in size. Right atrial pressure is estimated at 3 mmHg. Interatrial Septum:  No atrial level shunt detected by color flow Doppler. Pericardium: There is no evidence of pericardial effusion. Mitral Valve: The mitral valve is normal in structure. Mild thickening of the mitral valve leaflet. Mild calcification of the mitral valve leaflet. Mitral valve regurgitation is mild by color flow Doppler. Tricuspid Valve: The tricuspid valve is normal in structure. Tricuspid valve regurgitation is mild by color flow Doppler. Aortic Valve: The aortic valve has an indeterminant number of cusps Moderate thickening of the aortic valve Moderate calcification of the aortic valve. Aortic valve regurgitation is mild by color flow Doppler. There is mild stenosis of the aortic valve,  with a calculated valve area of 1.93 cm. Pulmonic Valve: The pulmonic valve was grossly normal. Pulmonic valve regurgitation is mild by color flow Doppler. Aorta: There is mild dilatation of the aortic root measuring 39 mm. Venous: The inferior vena cava is normal in size with greater than 50% respiratory  variability. Compared to previous exam: 06/27/17 EF 55-60%.           Labs/Other Tests and Data Reviewed:    EKG:  No ECG reviewed.  Recent Labs: 12/08/2018: ALT 109 02/25/2019: Hemoglobin 10.9; Platelets 118; TSH 9.230 06/22/2019: NT-Pro BNP 3,107 07/23/2019: BUN 15; Creatinine, Ser 1.78; Potassium 4.8; Sodium 133   Recent Lipid Panel Lab Results  Component Value Date/Time   CHOL 178 10/01/2017 01:17 PM   TRIG 108 10/01/2017 01:17 PM   HDL 63 10/01/2017 01:17 PM   CHOLHDL 2.8 10/01/2017 01:17 PM   LDLCALC 93 10/01/2017 01:17 PM   LDLDIRECT 191.1 01/04/2011 12:18 PM    Wt Readings from Last 3 Encounters:  08/19/19 172 lb (78 kg)  06/24/19 171 lb 6.4 oz (77.7 kg)  05/20/19 171 lb (77.6 kg)     Objective:    Vital Signs:  BP 119/62   Pulse (!) 56   Ht 5\' 8"  (1.727 m)   Wt 172 lb (78 kg)   BMI 26.15 kg/m    VITAL SIGNS:  reviewed GEN:  no acute distress RESPIRATORY:  normal respiratory effort, symmetric expansion CARDIOVASCULAR:  no peripheral edema  ASSESSMENT & PLAN:    1. Chronic diastolic CHF-well compensated-no edema or shortness of breath. Continue lasix 60 mg daily. 2. Persistent atrial fibrillation Toprol stopped and amiodarone decreased because of bradycardia now stable on Coumadin managed by Coumadin clinic 3. History of bradycardia Toprol stopped 4. CAD without angina 5. Falls with fractures 11/2018 and 01/2019.  Need to watch closely on Coumadin has had no falls since meds have been adjusted. 6. CKD-last Crt 1.78 07/23/19. Will recheck next week with INR check.   COVID-19 Education: The signs and symptoms of COVID-19 were discussed with the patient and how to seek care for testing (follow up with PCP or arrange E-visit).   The importance of social distancing was discussed today.  Time:   Today, I have spent 10:40 minutes with the patient with telehealth technology discussing the above problems.     Medication Adjustments/Labs and Tests Ordered:  Current medicines are reviewed at length with the patient today.  Concerns regarding medicines are outlined above.   Tests Ordered: No orders of the defined types were placed in this encounter.   Medication Changes: No orders of the defined types were placed in this encounter.   Follow Up:  Virtual Visit  in 6 week(s) Ermalinda Barrios PA-C  Signed, Ermalinda Barrios, PA-C  08/19/2019 11:23 AM    [facility]

## 2019-08-19 ENCOUNTER — Telehealth (INDEPENDENT_AMBULATORY_CARE_PROVIDER_SITE_OTHER): Payer: Medicare Other | Admitting: Physician Assistant

## 2019-08-19 ENCOUNTER — Encounter: Payer: Self-pay | Admitting: Physician Assistant

## 2019-08-19 ENCOUNTER — Telehealth: Payer: Self-pay

## 2019-08-19 VITALS — BP 119/62 | HR 56 | Ht 68.0 in | Wt 172.0 lb

## 2019-08-19 DIAGNOSIS — N183 Chronic kidney disease, stage 3 unspecified: Secondary | ICD-10-CM

## 2019-08-19 DIAGNOSIS — I2510 Atherosclerotic heart disease of native coronary artery without angina pectoris: Secondary | ICD-10-CM

## 2019-08-19 DIAGNOSIS — R001 Bradycardia, unspecified: Secondary | ICD-10-CM | POA: Diagnosis not present

## 2019-08-19 DIAGNOSIS — Z9181 History of falling: Secondary | ICD-10-CM

## 2019-08-19 DIAGNOSIS — I25812 Atherosclerosis of bypass graft of coronary artery of transplanted heart without angina pectoris: Secondary | ICD-10-CM

## 2019-08-19 DIAGNOSIS — I4819 Other persistent atrial fibrillation: Secondary | ICD-10-CM

## 2019-08-19 DIAGNOSIS — I5032 Chronic diastolic (congestive) heart failure: Secondary | ICD-10-CM | POA: Diagnosis not present

## 2019-08-19 NOTE — Patient Instructions (Signed)
Medication Instructions:  Your physician recommends that you continue on your current medications as directed. Please refer to the Current Medication list given to you today.  *If you need a refill on your cardiac medications before your next appointment, please call your pharmacy*  Lab Work: None today If you have labs (blood work) drawn today and your tests are completely normal, you will receive your results only by: Marland Kitchen MyChart Message (if you have MyChart) OR . A paper copy in the mail If you have any lab test that is abnormal or we need to change your treatment, we will call you to review the results.  Testing/Procedures: None today  Follow-Up: At [facility], you and your health needs are our priority.  As part of our continuing mission to provide you with exceptional heart care, we have created designated Provider Care Teams.  These Care Teams include your primary Cardiologist (physician) and Advanced Practice Providers (APPs -  Physician Assistants and Nurse Practitioners) who all work together to provide you with the care you need, when you need it.  Your next appointment:   6-8  week(s)  The format for your next appointment:   Virtual Visit   Provider:   Ermalinda Barrios, PA-C  Other Instructions None     Thank you for choosing [facility] !

## 2019-08-19 NOTE — Telephone Encounter (Signed)
[facility] Visit Follow Up Request   Date of Request ([place]):  August 19, 2019  Requesting Provider:  Ermalinda Barrios, PA    Agency Requested:    [facility] Contact:  Glory Buff, NP [address] [place], [place] [address] Phone #:  [phone] Fax #:  [phone]  Patient Demographic Information: Name:  Ricardo Valencia Age:  85 y.o.   DOB:  Oct 08, 1928  MRN:  [id number]   Home visit progress note(s), lab results, telemetry strips, etc were reviewed.  Provider Recommendations: Please Draw BMET on 12/7, the same day as INR check. Can draw BNP (if he is having swelling)  Follow up home services requested:  Labs:  BMET and BNP

## 2019-08-24 DIAGNOSIS — Z5181 Encounter for therapeutic drug level monitoring: Secondary | ICD-10-CM | POA: Diagnosis not present

## 2019-08-24 DIAGNOSIS — N183 Chronic kidney disease, stage 3 unspecified: Secondary | ICD-10-CM | POA: Diagnosis not present

## 2019-08-24 DIAGNOSIS — I5032 Chronic diastolic (congestive) heart failure: Secondary | ICD-10-CM | POA: Diagnosis not present

## 2019-08-25 ENCOUNTER — Telehealth: Payer: Self-pay | Admitting: Pharmacist

## 2019-08-25 ENCOUNTER — Ambulatory Visit (INDEPENDENT_AMBULATORY_CARE_PROVIDER_SITE_OTHER): Payer: Medicare Other | Admitting: Cardiology

## 2019-08-25 DIAGNOSIS — Z7901 Long term (current) use of anticoagulants: Secondary | ICD-10-CM | POA: Diagnosis not present

## 2019-08-25 LAB — BASIC METABOLIC PANEL
BUN/Creatinine Ratio: 10 (ref 10–24)
BUN: 19 mg/dL (ref 8–27)
CO2: 27 mmol/L (ref 20–29)
Calcium: 7.8 mg/dL — ABNORMAL LOW (ref 8.6–10.2)
Chloride: 103 mmol/L (ref 96–106)
Creatinine, Ser: 1.97 mg/dL — ABNORMAL HIGH (ref 0.76–1.27)
GFR calc Af Amer: 34 mL/min/{1.73_m2} — ABNORMAL LOW (ref >59)
GFR calc non Af Amer: 29 mL/min/{1.73_m2} — ABNORMAL LOW (ref >59)
Glucose: 101 mg/dL — ABNORMAL HIGH (ref 65–99)
Potassium: 4.3 mmol/L (ref 3.5–5.2)
Sodium: 140 mmol/L (ref 134–144)

## 2019-08-25 LAB — PROTIME-INR
INR: 2.3 — ABNORMAL HIGH (ref 0.9–1.2)
Prothrombin Time: 24.3 s — ABNORMAL HIGH (ref 9.1–12.0)

## 2019-08-25 LAB — PRO B NATRIURETIC PEPTIDE: NT-Pro BNP: 2555 pg/mL — ABNORMAL HIGH (ref 0–486)

## 2019-08-25 NOTE — Progress Notes (Signed)
Home Visit on 08/24/19 on behalf of [facility] to check VS and obtain lab work. Ricardo Valencia had no complaints, stating he felt fine.  BMET, BNP, PT/INR drawn and brought to the [facility] on [place], [place].

## 2019-08-25 NOTE — Telephone Encounter (Signed)
[facility] Visit Follow Up Request   Date of Request ([place]):  August 25, 2019  Requesting Provider:  Ermalinda Barrios, PA    Agency Requested:    [facility] Contact:  Glory Buff, NP [address] [place], [place] [address] Phone #:  [phone] Fax #:  [phone]  Patient Demographic Information: Name:  Ricardo Valencia Age:  85 y.o.   DOB:  August 04, 1929  MRN:  [id number]   Home visit progress note(s), lab results, telemetry strips, etc were reviewed.  Provider Recommendations: Please check INR on 09/21/19  Follow up home services requested:  Labs:  INR

## 2019-08-25 NOTE — Patient Instructions (Signed)
Description   Spoke with wife and pt and advised pt to continue taking 1 tablet daily except 1/2 tablet on Sundays. Recheck INR in 4 weeks when [facility] goes back out - order sent today fax [phone]. Call us with any medication changes or concern [phone] [facility], Main # [phone].

## 2019-09-22 ENCOUNTER — Telehealth: Payer: Self-pay | Admitting: Pharmacist

## 2019-09-22 ENCOUNTER — Ambulatory Visit (INDEPENDENT_AMBULATORY_CARE_PROVIDER_SITE_OTHER): Payer: Medicare Other

## 2019-09-22 DIAGNOSIS — Z7901 Long term (current) use of anticoagulants: Secondary | ICD-10-CM

## 2019-09-22 DIAGNOSIS — I4819 Other persistent atrial fibrillation: Secondary | ICD-10-CM

## 2019-09-22 DIAGNOSIS — I480 Paroxysmal atrial fibrillation: Secondary | ICD-10-CM

## 2019-09-22 LAB — PROTIME-INR
INR: 1.6 — ABNORMAL HIGH (ref 0.9–1.2)
Prothrombin Time: 16.5 s — ABNORMAL HIGH (ref 9.1–12.0)

## 2019-09-22 NOTE — Patient Instructions (Signed)
Description   Spoke with wife and pt and advised to take 1.5 tablets today, then start taking 1 tablet daily. Recheck INR in 2 weeks when [facility] goes back out - order sent today fax [phone]. Call us with any medication changes or concern [phone] [facility], Main # [phone].

## 2019-09-22 NOTE — Telephone Encounter (Signed)
[facility] Visit Follow Up Request   Date of Request ([place]):  September 22, 2019  Requesting Provider:  Nena Polio    Agency Requested:    [facility] Contact:  Glory Buff, NP [address] [place], [place] [address] Phone #:  [phone] Fax #:  [phone]  Patient Demographic Information: Name:  Ricardo Valencia Age:  86 y.o.   DOB:  1928/12/02  MRN:  [id number]   Home visit progress note(s), lab results, telemetry strips, etc were reviewed.  Provider Recommendations: Please draw INR on 10/05/2019  Follow up home services requested:  Labs:  INR  All labs ordered for this home visit have been released and the request was sent to Chrissie Noa at [facility].

## 2019-09-29 NOTE — Progress Notes (Signed)
Virtual Visit via Video Note   This visit type was conducted due to national recommendations for restrictions regarding the COVID-19 Pandemic (e.g. social distancing) in an effort to limit this patient's exposure and mitigate transmission in our community.  Due to his co-morbid illnesses, this patient is at least at moderate risk for complications without adequate follow up.  This format is felt to be most appropriate for this patient at this time.  All issues noted in this document were discussed and addressed.  A limited physical exam was performed with this format.  Please refer to the patient's chart for his consent to telehealth for [facility].   Date:  09/30/2019   ID:  Ricardo Valencia, DOB [birth year], MRN [id number]  Patient Location: Home Provider Location: Home  PCP:  Eulas Post, MD  Cardiologist:  Lauree Chandler, MD   Electrophysiologist:  None   Evaluation Performed:  Follow-Up Visit  Chief Complaint:  F/U  History of Present Illness:    Ricardo Valencia is a 86 y.o. male with  with persistent atrial fibrillation, CAD status post DES to the mid LAD 2016, echo at that time LVEF 25 to 30% improved to 45 to 50% atrial fibrillation 2016 cardioverted back to normal sinus rhythm.  Patient had recurrent atrial fibrillation and was on Tikosyn but stopped because of prolonged QT interval and now is on amiodarone. Last echo 06/2017 EF 55 to 60% with basal inferior akinesis mildly dilated RV and mildly decreased RV systolic function.    Patient was last seen by Dr. Aundra Dubin in the heart failure clinic 11/2017 and was doing well.  No aspirin given stable CAD and Coumadin use.  He suspected OSA but the patient did not want to do a sleep study.   I  stopped his toprol and decreased amiodarone due to slow pulse and multiple falls resulting in L3 lumbar fracture and rib fractures. He's also struggled with diastolic CHF.  Patient's BNP has remained elevated despite lack of  symptoms.   Patient denies any swelling and breathing is fine. No complaints.Gave instructions on how to get the covid19 vaccine.    The patient does not have symptoms concerning for COVID-19 infection (fever, chills, cough, or new shortness of breath).    Past Medical History:  Diagnosis Date  . CAD (coronary artery disease)    DES to mid LAD 11/10/2014  . Chronic systolic heart failure ([place])   . Hay fever    hay fever, allergies  . History of echocardiogram    Echo 3/17: EF 40%, diff HK, trivial AI, trivial MR, mod LAE, mild reduced RVSF, mod RAE  . Hyperlipidemia   . Hypertension   . Persistent atrial fibrillation [facility])    Past Surgical History:  Procedure Laterality Date  . APPENDECTOMY  1947  . CARDIOVERSION N/A 12/30/2014   Procedure: CARDIOVERSION;  Surgeon: Jerline Pain, MD;  Location: [place];  Service: Cardiovascular;  Laterality: N/A;  . CORONARY ANGIOPLASTY WITH STENT PLACEMENT    . LEFT HEART CATHETERIZATION WITH CORONARY ANGIOGRAM N/A 11/10/2014   Procedure: LEFT HEART CATHETERIZATION WITH CORONARY ANGIOGRAM;  Surgeon: Wellington Hampshire, MD;  Location: [place] CATH LAB;  Service: Cardiovascular;  Laterality: N/A;  . PERCUTANEOUS CORONARY STENT INTERVENTION (PCI-S)  11/10/2014   Procedure: PERCUTANEOUS CORONARY STENT INTERVENTION (PCI-S);  Surgeon: Wellington Hampshire, MD;  Location: [facility] CATH LAB;  Service: Cardiovascular;;     Current Meds  Medication Sig  . amiodarone (PACERONE) 200 MG tablet Take 0.5  tablets (100 mg total) by mouth daily.  . Ascorbic Acid (VITAMIN C) 1000 MG tablet Take 1,000 mg by mouth daily.  Marland Kitchen atorvastatin (LIPITOR) 80 MG tablet TAKE 1 TABLET BY MOUTH ONCE DAILY AT  6PM  . calcium carbonate (TUMS) 500 MG chewable tablet Chew 1 tablet (200 mg of elemental calcium total) by mouth daily.  . furosemide (LASIX) 40 MG tablet Take 1.5 tablets (60 mg total) by mouth daily.  Marland Kitchen gabapentin (NEURONTIN) 300 MG capsule Take 1 capsule (300 mg total) by mouth 2  (two) times daily.  Marland Kitchen levothyroxine (SYNTHROID, LEVOTHROID) 25 MCG tablet Take 1 tablet (25 mcg total) by mouth daily before breakfast.  . lisinopril (ZESTRIL) 2.5 MG tablet Take 1 tablet (2.5 mg total) by mouth daily.  . nitroGLYCERIN (NITROSTAT) 0.4 MG SL tablet Place 1 tablet (0.4 mg total) under the tongue every 5 (five) minutes x 3 doses as needed for chest pain.  . potassium chloride (K-DUR) 10 MEQ tablet Take 1 tablet by mouth once daily  . primidone (MYSOLINE) 50 MG tablet Take 1 tablet (50 mg total) by mouth 2 (two) times daily. Take at 10am and 6pm  . warfarin (COUMADIN) 5 MG tablet TAKE AS DIRECTED BY  COUMADIN  CLINIC     Allergies:   Patient has no known allergies.   Social History   Tobacco Use  . Smoking status: Never Smoker  . Smokeless tobacco: Never Used  . Tobacco comment: "smoked when I was 22"  Substance Use Topics  . Alcohol use: Yes    Alcohol/week: 7.0 - 14.0 standard drinks    Types: 7 - 14 Cans of beer per week    Comment: occ  . Drug use: No     Family Hx: The patient's family history includes Cancer in his father and mother.  ROS:   Please see the history of present illness.      All other systems reviewed and are negative.   Prior CV studies:   The following studies were reviewed today:   Echo 11/2018  IMPRESSIONS      1. The left ventricle has normal systolic function, with an ejection fraction of 55-60%. The cavity size was normal. Left ventricular diastolic Doppler parameters are consistent with pseudonormalization.  2. The right ventricle has normal systolic function. The cavity was normal. There is no increase in right ventricular wall thickness.  3. Left atrial size was moderately dilated.  4. The mitral valve is normal in structure. Mild thickening of the mitral valve leaflet. Mild calcification of the mitral valve leaflet.  5. The tricuspid valve is normal in structure.  6. The aortic valve has an indeterminant number of cusps Moderate  thickening of the aortic valve Moderate calcification of the aortic valve. Aortic valve regurgitation is mild by color flow Doppler. mild stenosis of the aortic valve.  7. The pulmonic valve was grossly normal. Pulmonic valve regurgitation is mild by color flow Doppler.  8. There is mild dilatation of the aortic root measuring 39 mm.   FINDINGS  Left Ventricle: The left ventricle has normal systolic function, with an ejection fraction of 55-60%. The cavity size was normal. There is no increase in left ventricular wall thickness. Left ventricular diastolic Doppler parameters are consistent with  pseudonormalization Right Ventricle: The right ventricle has normal systolic function. The cavity was normal. There is no increase in right ventricular wall thickness. Left Atrium: left atrial size was moderately dilated Right Atrium: right atrial size was normal in size.  Right atrial pressure is estimated at 3 mmHg. Interatrial Septum: No atrial level shunt detected by color flow Doppler. Pericardium: There is no evidence of pericardial effusion. Mitral Valve: The mitral valve is normal in structure. Mild thickening of the mitral valve leaflet. Mild calcification of the mitral valve leaflet. Mitral valve regurgitation is mild by color flow Doppler. Tricuspid Valve: The tricuspid valve is normal in structure. Tricuspid valve regurgitation is mild by color flow Doppler. Aortic Valve: The aortic valve has an indeterminant number of cusps Moderate thickening of the aortic valve Moderate calcification of the aortic valve. Aortic valve regurgitation is mild by color flow Doppler. There is mild stenosis of the aortic valve,  with a calculated valve area of 1.93 cm. Pulmonic Valve: The pulmonic valve was grossly normal. Pulmonic valve regurgitation is mild by color flow Doppler. Aorta: There is mild dilatation of the aortic root measuring 39 mm. Venous: The inferior vena cava is normal in size with greater than  50% respiratory variability. Compared to previous exam: 06/27/17 EF 55-60%.           Labs/Other Tests and Data Reviewed:    EKG:  No ECG reviewed.  Recent Labs: 12/08/2018: ALT 109 02/25/2019: Hemoglobin 10.9; Platelets 118; TSH 9.230 08/24/2019: BUN 19; Creatinine, Ser 1.97; NT-Pro BNP 2,555; Potassium 4.3; Sodium 140   Recent Lipid Panel Lab Results  Component Value Date/Time   CHOL 178 10/01/2017 01:17 PM   TRIG 108 10/01/2017 01:17 PM   HDL 63 10/01/2017 01:17 PM   CHOLHDL 2.8 10/01/2017 01:17 PM   LDLCALC 93 10/01/2017 01:17 PM   LDLDIRECT 191.1 01/04/2011 12:18 PM    Wt Readings from Last 3 Encounters:  09/30/19 173 lb 8 oz (78.7 kg)  08/25/19 172 lb (78 kg)  08/19/19 172 lb (78 kg)     Objective:    Vital Signs:  BP (!) 120/58   Pulse (!) 55   Ht 5\' 8"  (1.727 m)   Wt 173 lb 8 oz (78.7 kg)   BMI 26.38 kg/m    VITAL SIGNS:  reviewed GEN:  no acute distress RESPIRATORY:  normal respiratory effort, symmetric expansion CARDIOVASCULAR:  no peripheral edema  ASSESSMENT & PLAN:    Chronic diastolic CHF compensated  Persistent atrial fibrillation with bradycardia Toprol had to be stopped and amiodarone decreased. Also on Coumadin-managed by coumadin clinic  CAD without angina   Falls with fractures 11/2018 and 01/2019 no falls since and stable watching closely on Coumadin  CKD stage 3 Crt 1.97. recheck on Monday  COVID-19 Education: The signs and symptoms of COVID-19 were discussed with the patient and how to seek care for testing (follow up with PCP or arrange E-visit).   The importance of social distancing was discussed today.  Time:   Today, I have spent 13:30 minutes with the patient with telehealth technology discussing the above problems.     Medication Adjustments/Labs and Tests Ordered: Current medicines are reviewed at length with the patient today.  Concerns regarding medicines are outlined above.   Tests Ordered: No orders of the defined  types were placed in this encounter.   Medication Changes: No orders of the defined types were placed in this encounter.   Follow Up:  Virtual Visit  in 2 month(s) Ermalinda Barrios PA-C  Signed, Ermalinda Barrios, PA-C  09/30/2019 2:03 PM    [place]

## 2019-09-30 ENCOUNTER — Telehealth (INDEPENDENT_AMBULATORY_CARE_PROVIDER_SITE_OTHER): Payer: Medicare Other | Admitting: Physician Assistant

## 2019-09-30 ENCOUNTER — Encounter: Payer: Self-pay | Admitting: Physician Assistant

## 2019-09-30 ENCOUNTER — Other Ambulatory Visit: Payer: Self-pay

## 2019-09-30 VITALS — BP 120/58 | HR 55 | Ht 68.0 in | Wt 173.5 lb

## 2019-09-30 DIAGNOSIS — I4819 Other persistent atrial fibrillation: Secondary | ICD-10-CM

## 2019-09-30 DIAGNOSIS — I5032 Chronic diastolic (congestive) heart failure: Secondary | ICD-10-CM

## 2019-09-30 DIAGNOSIS — Z9181 History of falling: Secondary | ICD-10-CM | POA: Diagnosis not present

## 2019-09-30 DIAGNOSIS — I2510 Atherosclerotic heart disease of native coronary artery without angina pectoris: Secondary | ICD-10-CM

## 2019-09-30 NOTE — Patient Instructions (Signed)
Your physician recommends that you continue on your current medications as directed. Please refer to the Current Medication list given to you today.  Your physician recommends that you return for lab work in:  BMET ON Monday [place] physician recommends that you schedule a follow-up appointment in:  2 MONTHS VIRTUAL VISIT [place]   COVID-19 Vaccine Information can be found at: [email or web address].uk For questions related to vaccine distribution or appointments, please email [email or web address] or call [phone].

## 2019-10-05 DIAGNOSIS — I4819 Other persistent atrial fibrillation: Secondary | ICD-10-CM | POA: Diagnosis not present

## 2019-10-05 DIAGNOSIS — I5032 Chronic diastolic (congestive) heart failure: Secondary | ICD-10-CM | POA: Diagnosis not present

## 2019-10-06 ENCOUNTER — Telehealth: Payer: Self-pay | Admitting: Pharmacist

## 2019-10-06 ENCOUNTER — Ambulatory Visit (INDEPENDENT_AMBULATORY_CARE_PROVIDER_SITE_OTHER): Payer: Medicare Other | Admitting: Cardiovascular Disease

## 2019-10-06 DIAGNOSIS — Z7901 Long term (current) use of anticoagulants: Secondary | ICD-10-CM | POA: Diagnosis not present

## 2019-10-06 LAB — PROTIME-INR
INR: 1.7 — ABNORMAL HIGH (ref 0.9–1.2)
Prothrombin Time: 17.8 s — ABNORMAL HIGH (ref 9.1–12.0)

## 2019-10-06 LAB — BASIC METABOLIC PANEL
BUN/Creatinine Ratio: 9 — ABNORMAL LOW (ref 10–24)
BUN: 17 mg/dL (ref 8–27)
CO2: 27 mmol/L (ref 20–29)
Calcium: 7.7 mg/dL — ABNORMAL LOW (ref 8.6–10.2)
Chloride: 106 mmol/L (ref 96–106)
Creatinine, Ser: 1.94 mg/dL — ABNORMAL HIGH (ref 0.76–1.27)
GFR calc Af Amer: 35 mL/min/{1.73_m2} — ABNORMAL LOW (ref >59)
GFR calc non Af Amer: 30 mL/min/{1.73_m2} — ABNORMAL LOW (ref >59)
Glucose: 106 mg/dL — ABNORMAL HIGH (ref 65–99)
Potassium: 4.1 mmol/L (ref 3.5–5.2)
Sodium: 139 mmol/L (ref 134–144)

## 2019-10-06 LAB — PRO B NATRIURETIC PEPTIDE: NT-Pro BNP: 1922 pg/mL — ABNORMAL HIGH (ref 0–486)

## 2019-10-06 NOTE — Patient Instructions (Signed)
Description   Spoke with wife and pt and advised to take 1.5 tablets today, then start taking 1 tablet daily. Recheck INR in 2 weeks when [facility] goes back out - order sent today fax [phone]. Call us with any medication changes or concern [phone] Cou[facility], Main # [phone].

## 2019-10-06 NOTE — Telephone Encounter (Signed)
[place] Visit Follow Up Request   Date of Request ([place]):  October 06, 2019  Requesting Provider:  Nena Polio    Agency Requested:    [facility] Contact:  Glory Buff, NP [address] [place], [place] [address] Phone #:  [phone] Fax #:  [phone]  Patient Demographic Information: Name:  Ricardo Valencia Age:  87 y.o.   DOB:  1929-07-09  MRN:  [id number]   Home visit progress note(s), lab results, telemetry strips, etc were reviewed.  Provider Recommendations: Please draw INR on 10/19/2019  Follow up home services requested:  Labs:  INR  All labs ordered for this home visit have been released and the request was sent to Chrissie Noa at [facility].

## 2019-10-12 ENCOUNTER — Other Ambulatory Visit: Payer: Self-pay | Admitting: Family Medicine

## 2019-10-12 ENCOUNTER — Other Ambulatory Visit: Payer: Self-pay | Admitting: Cardiovascular Disease

## 2019-10-19 DIAGNOSIS — I4819 Other persistent atrial fibrillation: Secondary | ICD-10-CM | POA: Diagnosis not present

## 2019-10-20 ENCOUNTER — Telehealth: Payer: Self-pay | Admitting: Pharmacist

## 2019-10-20 ENCOUNTER — Ambulatory Visit (INDEPENDENT_AMBULATORY_CARE_PROVIDER_SITE_OTHER): Payer: Medicare Other | Admitting: Pharmacist

## 2019-10-20 DIAGNOSIS — Z7901 Long term (current) use of anticoagulants: Secondary | ICD-10-CM | POA: Diagnosis not present

## 2019-10-20 LAB — PROTIME-INR
INR: 1.9 — ABNORMAL HIGH (ref 0.9–1.2)
Prothrombin Time: 20 s — ABNORMAL HIGH (ref 9.1–12.0)

## 2019-10-20 NOTE — Telephone Encounter (Signed)
[place] Visit Follow Up Request   Date of Request ([place]):  October 20, 2019  Requesting Provider:  Amie [place]    Agency Requested:    [facility] Contact:  Glory Buff, NP [address] [place], [place] [address] Phone #:  [phone] Fax #:  [phone]  Patient Demographic Information: Name:  Ricardo Valencia Age:  86 y.o.   DOB:  1929-05-22  MRN:  [id number]   Home visit progress note(s), lab results, telemetry strips, etc were reviewed.  Provider Recommendations: Please draw an INR and CBC on 11/02/19  Follow up home services requested:  Labs:  INR and CBC  All labs ordered for this home visit have been released and the request was sent to Chrissie Noa at [facility].

## 2019-10-20 NOTE — Progress Notes (Signed)
Patients wife states that patient is always cold. I advised that it could be from his afib and poor profusion or if he were anemic. Hgb slightly low in Oct. Will order CBC with next labs

## 2019-11-02 DIAGNOSIS — Z7901 Long term (current) use of anticoagulants: Secondary | ICD-10-CM | POA: Diagnosis not present

## 2019-11-03 ENCOUNTER — Ambulatory Visit (INDEPENDENT_AMBULATORY_CARE_PROVIDER_SITE_OTHER): Payer: Medicare Other | Admitting: Pharmacist

## 2019-11-03 ENCOUNTER — Telehealth: Payer: Self-pay | Admitting: Pharmacist

## 2019-11-03 DIAGNOSIS — Z5181 Encounter for therapeutic drug level monitoring: Secondary | ICD-10-CM

## 2019-11-03 DIAGNOSIS — Z7901 Long term (current) use of anticoagulants: Secondary | ICD-10-CM

## 2019-11-03 NOTE — Telephone Encounter (Signed)
[place] Visit Follow Up Request   Date of Request ([place]):  November 03, 2019  Requesting Provider:  Nena Polio    Agency Requested:    [facility] Contact:  Glory Buff, NP [address] [place], [place] [address] Phone #:  [phone] Fax #:  [phone]  Patient Demographic Information: Name:  Ricardo Valencia Age:  86 y.o.   DOB:  10-18-1928  MRN:  [id number]   Home visit progress note(s), lab results, telemetry strips, etc were reviewed.  Provider Recommendations: Please draw INR on 11/16/19  Follow up home services requested:  Labs:  INR  All labs ordered for this home visit have been released and the request was sent to Chrissie Noa at [facility].

## 2019-11-04 LAB — PROTIME-INR
INR: 2 — ABNORMAL HIGH (ref 0.9–1.2)
Prothrombin Time: 20.6 s — ABNORMAL HIGH (ref 9.1–12.0)

## 2019-11-04 LAB — CBC
Hematocrit: 38.6 % (ref 37.5–51.0)
Hemoglobin: 12.7 g/dL — ABNORMAL LOW (ref 13.0–17.7)
MCH: 30.9 pg (ref 26.6–33.0)
MCHC: 32.9 g/dL (ref 31.5–35.7)
MCV: 94 fL (ref 79–97)
Platelets: 164 10*3/uL (ref 150–450)
RBC: 4.11 x10E6/uL — ABNORMAL LOW (ref 4.14–5.80)
RDW: 13.6 % (ref 11.6–15.4)
WBC: 6.3 10*3/uL (ref 3.4–10.8)

## 2019-11-15 ENCOUNTER — Ambulatory Visit: Payer: Medicare Other

## 2019-11-16 DIAGNOSIS — Z5181 Encounter for therapeutic drug level monitoring: Secondary | ICD-10-CM | POA: Diagnosis not present

## 2019-11-17 ENCOUNTER — Telehealth: Payer: Self-pay | Admitting: Pharmacist

## 2019-11-17 ENCOUNTER — Ambulatory Visit (INDEPENDENT_AMBULATORY_CARE_PROVIDER_SITE_OTHER): Payer: Medicare Other | Admitting: Pharmacist

## 2019-11-17 DIAGNOSIS — Z7901 Long term (current) use of anticoagulants: Secondary | ICD-10-CM | POA: Diagnosis not present

## 2019-11-17 LAB — PROTIME-INR
INR: 1.9 — ABNORMAL HIGH (ref 0.9–1.2)
Prothrombin Time: 19.7 s — ABNORMAL HIGH (ref 9.1–12.0)

## 2019-11-17 NOTE — Telephone Encounter (Signed)
[place] Visit Follow Up Request   Date of Request ([place]):  November 17, 2019  Requesting Provider:  Nena Polio    Agency Requested:    [facility] Contact:  Glory Buff, NP [address] [place], [place] [address] Phone #:  [phone] Fax #:  [phone]  Patient Demographic Information: Name:  Ricardo Valencia Age:  86 y.o.   DOB:  21-Jan-1929  MRN:  [id number]   Home visit progress note(s), lab results, telemetry strips, etc were reviewed.  Provider Recommendations: Please draw INR on 12/07/19  Follow up home services requested:  Labs:  INR  All labs ordered for this home visit have been released and the request was sent to Chrissie Noa at [facility].

## 2019-11-23 NOTE — Progress Notes (Signed)
Virtual Visit via Video Note   This visit type was conducted due to national recommendations for restrictions regarding the COVID-19 Pandemic (e.g. social distancing) in an effort to limit this patient's exposure and mitigate transmission in our community.  Due to his co-morbid illnesses, this patient is at least at moderate risk for complications without adequate follow up.  This format is felt to be most appropriate for this patient at this time.  All issues noted in this document were discussed and addressed.  A limited physical exam was performed with this format.  Please refer to the patient's chart for his consent to telehealth for [facility].   The patient was identified using 2 identifiers.  Date:  11/24/2019   ID:  Ricardo Valencia, DOB December 15, 1928, MRN [id number]  Patient Location: Home Provider Location: Home  PCP:  Eulas Post, MD  Cardiologist:  Lauree Chandler, MD  Electrophysiologist:  None   Evaluation Performed:  Follow-Up Visit  Chief Complaint:  Follow up  History of Present Illness:    Ricardo Valencia is a 86 y.o. male with persistent atrial fibrillation, CAD status post DES to the mid LAD 2016, echo at that time LVEF 25 to 30% improved to 45 to 50% atrial fibrillation 2016 cardioverted back to normal sinus rhythm.  Patient had recurrent atrial fibrillation and was on Tikosyn but stopped because of prolonged QT interval and now is on amiodarone. Last echo 06/2017 EF 55 to 60% with basal inferior akinesis mildly dilated RV and mildly decreased RV systolic function.    Patient was last seen by Dr. Aundra Dubin in the heart failure clinic 11/2017 and was doing well.  No aspirin given stable CAD and Coumadin use.  He suspected OSA but the patient did not want to do a sleep study.   I  stopped his toprol and decreased amiodarone due to slow pulse and multiple falls resulting in L3 lumbar fracture and rib fractures. He's also struggled with diastolic CHF.   Patient's BNP has remained elevated despite lack of symptoms. LOV 09/30/19 and I didn't make any changes.  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 Walking about a block without shortness of breath. Had to cancel     The patient does not have symptoms concerning for COVID-19 infection (fever, chills, cough, or new shortness of breath).    Past Medical History:  Diagnosis Date  . CAD (coronary artery disease)    DES to mid LAD 11/10/2014  . Chronic systolic heart failure ([place])   . Hay fever    hay fever, allergies  . History of echocardiogram    Echo 3/17: EF 40%, diff HK, trivial AI, trivial MR, mod LAE, mild reduced RVSF, mod RAE  . Hyperlipidemia   . Hypertension   . Persistent atrial fibrillation [facility])    Past Surgical History:  Procedure Laterality Date  . APPENDECTOMY  1947  . CARDIOVERSION N/A 12/30/2014   Procedure: CARDIOVERSION;  Surgeon: Jerline Pain, MD;  Location: [facility];  Service: Cardiovascular;  Laterality: N/A;  . CORONARY ANGIOPLASTY WITH STENT PLACEMENT    . LEFT HEART CATHETERIZATION WITH CORONARY ANGIOGRAM N/A 11/10/2014   Procedure: LEFT HEART CATHETERIZATION WITH CORONARY ANGIOGRAM;  Surgeon: Wellington Hampshire, MD;  Location: [facility] CATH LAB;  Service: Cardiovascular;  Laterality: N/A;  . PERCUTANEOUS CORONARY STENT INTERVENTION (PCI-S)  11/10/2014   Procedure: PERCUTANEOUS CORONARY STENT INTERVENTION (PCI-S);  Surgeon: Wellington Hampshire, MD;  Location: [facility] CATH LAB;  Service: Cardiovascular;;  Current Meds  Medication Sig  . amiodarone (PACERONE) 200 MG tablet Take 0.5 tablets (100 mg total) by mouth daily.  . Ascorbic Acid (VITAMIN C) 1000 MG tablet Take 1,000 mg by mouth daily.  Marland Kitchen atorvastatin (LIPITOR) 80 MG tablet TAKE 1 TABLET BY MOUTH ONCE DAILY AT  6PM  . calcium carbonate (TUMS) 500 MG chewable tablet Chew 1 tablet (200 mg of elemental calcium total) by mouth daily.  . furosemide (LASIX) 40 MG tablet Take 1.5 tablets (60 mg  total) by mouth daily.  Marland Kitchen gabapentin (NEURONTIN) 300 MG capsule Take 1 capsule (300 mg total) by mouth 2 (two) times daily.  Marland Kitchen levothyroxine (SYNTHROID) 25 MCG tablet TAKE 1 TABLET BY MOUTH ONCE DAILY BEFORE BREAKFAST  . lisinopril (ZESTRIL) 2.5 MG tablet Take 1 tablet (2.5 mg total) by mouth daily.  . nitroGLYCERIN (NITROSTAT) 0.4 MG SL tablet Place 1 tablet (0.4 mg total) under the tongue every 5 (five) minutes x 3 doses as needed for chest pain.  . potassium chloride (K-DUR) 10 MEQ tablet Take 1 tablet by mouth once daily  . primidone (MYSOLINE) 50 MG tablet Take 1 tablet (50 mg total) by mouth 2 (two) times daily. Take at 10am and 6pm  . warfarin (COUMADIN) 5 MG tablet TAKE AS DIRECTED BY COUMADIN CLINIC     Allergies:   Patient has no known allergies.   Social History   Tobacco Use  . Smoking status: Never Smoker  . Smokeless tobacco: Never Used  . Tobacco comment: "smoked when I was 22"  Substance Use Topics  . Alcohol use: Yes    Alcohol/week: 7.0 - 14.0 standard drinks    Types: 7 - 14 Cans of beer per week    Comment: occ  . Drug use: No     Family Hx: The patient's family history includes Cancer in his father and mother.  ROS:   Please see the history of present illness.      All other systems reviewed and are negative.   Prior CV studies:   The following studies were reviewed today:   Echo 11/2018  IMPRESSIONS      1. The left ventricle has normal systolic function, with an ejection fraction of 55-60%. The cavity size was normal. Left ventricular diastolic Doppler parameters are consistent with pseudonormalization.  2. The right ventricle has normal systolic function. The cavity was normal. There is no increase in right ventricular wall thickness.  3. Left atrial size was moderately dilated.  4. The mitral valve is normal in structure. Mild thickening of the mitral valve leaflet. Mild calcification of the mitral valve leaflet.  5. The tricuspid valve is normal  in structure.  6. The aortic valve has an indeterminant number of cusps Moderate thickening of the aortic valve Moderate calcification of the aortic valve. Aortic valve regurgitation is mild by color flow Doppler. mild stenosis of the aortic valve.  7. The pulmonic valve was grossly normal. Pulmonic valve regurgitation is mild by color flow Doppler.  8. There is mild dilatation of the aortic root measuring 39 mm.   FINDINGS  Left Ventricle: The left ventricle has normal systolic function, with an ejection fraction of 55-60%. The cavity size was normal. There is no increase in left ventricular wall thickness. Left ventricular diastolic Doppler parameters are consistent with  pseudonormalization Right Ventricle: The right ventricle has normal systolic function. The cavity was normal. There is no increase in right ventricular wall thickness. Left Atrium: left atrial size was moderately dilated  Right Atrium: right atrial size was normal in size. Right atrial pressure is estimated at 3 mmHg. Interatrial Septum: No atrial level shunt detected by color flow Doppler. Pericardium: There is no evidence of pericardial effusion. Mitral Valve: The mitral valve is normal in structure. Mild thickening of the mitral valve leaflet. Mild calcification of the mitral valve leaflet. Mitral valve regurgitation is mild by color flow Doppler. Tricuspid Valve: The tricuspid valve is normal in structure. Tricuspid valve regurgitation is mild by color flow Doppler. Aortic Valve: The aortic valve has an indeterminant number of cusps Moderate thickening of the aortic valve Moderate calcification of the aortic valve. Aortic valve regurgitation is mild by color flow Doppler. There is mild stenosis of the aortic valve,  with a calculated valve area of 1.93 cm. Pulmonic Valve: The pulmonic valve was grossly normal. Pulmonic valve regurgitation is mild by color flow Doppler. Aorta: There is mild dilatation of the aortic root  measuring 39 mm. Venous: The inferior vena cava is normal in size with greater than 50% respiratory variability. Compared to previous exam: 06/27/17 EF 55-60%.           Labs/Other Tests and Data Reviewed:    EKG:  No ECG reviewed.  Recent Labs: 12/08/2018: ALT 109 02/25/2019: TSH 9.230 10/05/2019: BUN 17; Creatinine, Ser 1.94; NT-Pro BNP 1,922; Potassium 4.1; Sodium 139 11/02/2019: Hemoglobin 12.7; Platelets 164   Recent Lipid Panel Lab Results  Component Value Date/Time   CHOL 178 10/01/2017 01:17 PM   TRIG 108 10/01/2017 01:17 PM   HDL 63 10/01/2017 01:17 PM   CHOLHDL 2.8 10/01/2017 01:17 PM   LDLCALC 93 10/01/2017 01:17 PM   LDLDIRECT 191.1 01/04/2011 12:18 PM    Wt Readings from Last 3 Encounters:  11/24/19 172 lb (78 kg)  09/30/19 173 lb 8 oz (78.7 kg)  08/25/19 172 lb (78 kg)     Objective:    Vital Signs:  Ht 5\' 8"  (1.727 m)   Wt 172 lb (78 kg)   BMI 26.15 kg/m    VITAL SIGNS:  reviewed GEN:  no acute distress RESPIRATORY:  normal respiratory effort, symmetric expansion CARDIOVASCULAR:  lower extremity edema noted-trace of edema  ASSESSMENT & PLAN:    Chronic diastolic CHF compensated [id number] 10/05/19. Only trace of edema, no dyspnea. No changes   Persistent atrial fibrillation with bradycardia Toprol had to be stopped and amiodarone decreased. Also on Coumadin-managed by coumadin clinic   CAD without angina    Falls with fractures 11/2018 and 01/2019 no falls since and stable watching closely on Coumadin   CKD stage 3 Crt 1.94-1/18/21  Educated on covid vaccine-number and website given for [employer] site since they want a drive thru.  COVID-19 Education: The signs and symptoms of COVID-19 were discussed with the patient and how to seek care for testing (follow up with PCP or arrange E-visit).   The importance of social distancing was discussed today.  Time:   Today, I have spent 17 minutes with the patient with telehealth technology discussing the above  problems.     Medication Adjustments/Labs and Tests Ordered: Current medicines are reviewed at length with the patient today.  Concerns regarding medicines are outlined above.   Tests Ordered: No orders of the defined types were placed in this encounter.   Medication Changes: No orders of the defined types were placed in this encounter.   Follow Up:  Virtual Visit  in 3 month(s) Ermalinda Barrios PA-C  Signed, Ermalinda Barrios, PA-C  11/24/2019 2:19  PM    [facility]

## 2019-11-24 ENCOUNTER — Telehealth: Payer: Self-pay | Admitting: *Deleted

## 2019-11-24 ENCOUNTER — Telehealth (INDEPENDENT_AMBULATORY_CARE_PROVIDER_SITE_OTHER): Payer: Medicare Other | Admitting: Physician Assistant

## 2019-11-24 ENCOUNTER — Encounter: Payer: Self-pay | Admitting: Physician Assistant

## 2019-11-24 VITALS — Ht 68.0 in | Wt 172.0 lb

## 2019-11-24 DIAGNOSIS — Z7189 Other specified counseling: Secondary | ICD-10-CM

## 2019-11-24 DIAGNOSIS — I5032 Chronic diastolic (congestive) heart failure: Secondary | ICD-10-CM | POA: Diagnosis not present

## 2019-11-24 DIAGNOSIS — N183 Chronic kidney disease, stage 3 unspecified: Secondary | ICD-10-CM | POA: Diagnosis not present

## 2019-11-24 DIAGNOSIS — Z9181 History of falling: Secondary | ICD-10-CM | POA: Diagnosis not present

## 2019-11-24 DIAGNOSIS — I4811 Longstanding persistent atrial fibrillation: Secondary | ICD-10-CM

## 2019-11-24 DIAGNOSIS — I2510 Atherosclerotic heart disease of native coronary artery without angina pectoris: Secondary | ICD-10-CM | POA: Diagnosis not present

## 2019-11-24 DIAGNOSIS — Z87891 Personal history of nicotine dependence: Secondary | ICD-10-CM

## 2019-11-24 DIAGNOSIS — Z8781 Personal history of (healed) traumatic fracture: Secondary | ICD-10-CM | POA: Diagnosis not present

## 2019-11-24 NOTE — Patient Instructions (Addendum)
Medication Instructions:  Your physician recommends that you continue on your current medications as directed. Please refer to the Current Medication list given to you today.  *If you need a refill on your cardiac medications before your next appointment, please call your pharmacy*   Lab Work: Lab work to be done by [employer] in about 3 months--prior to visit with Ermalinda Barrios, PA--BMP and CBC If you have labs (blood work) drawn today and your tests are completely normal, you will receive your results only by: Marland Kitchen MyChart Message (if you have MyChart) OR . A paper copy in the mail If you have any lab test that is abnormal or we need to change your treatment, we will call you to review the results.   Testing/Procedures: none   Follow-Up: At [facility], you and your health needs are our priority.  As part of our continuing mission to provide you with exceptional heart care, we have created designated Provider Care Teams.  These Care Teams include your primary Cardiologist (physician) and Advanced Practice Providers (APPs -  Physician Assistants and Nurse Practitioners) who all work together to provide you with the care you need, when you need it.  We recommend signing up for the patient portal called "MyChart".  Sign up information is provided on this After Visit Summary.  MyChart is used to connect with patients for Virtual Visits (Telemedicine).  Patients are able to view lab/test results, encounter notes, upcoming appointments, etc.  Non-urgent messages can be sent to your provider as well.   To learn more about what you can do with MyChart, go to [email or web address].    Your next appointment:   June 15,2021 at 1:15  The format for your next appointment:   Virtual Visit   Provider:   Ermalinda Barrios, PA-C   Other Instructions

## 2019-11-24 NOTE — Telephone Encounter (Signed)
[place] Visit Follow Up Request   Date of Request ([place]):  November 24, 2019  Requesting Provider:  Ermalinda Barrios, [place]    Agency Requested:    [facility] Contact:  Ricardo Valencia, [place], [place], [place], [place]  [id number] [email or web address]  Phone #: [phone] Fax #: [phone]  Patient Demographic Information: Name:  Ricardo Valencia Age:  86 y.o.   DOB:  19-Feb-1929  MRN:  [id number]   Home visit progress note(s), lab results, telemetry strips, etc were reviewed.  Provider Recommendations: LABS  Follow up home services requested:  Vital Signs (BP, Pulse, O2, Weight)  Medication Reconciliation  Labs:  BMET, CBC   NUMBER OF VISITS: 1  WHEN NEEDED: TO BE DONE IN 3 MONTHS PRIOR TO OFFICE VISIT WITH MICHELE LENZE, [place] IS ON 03/01/20

## 2019-12-02 ENCOUNTER — Telehealth: Payer: Medicare Other | Admitting: Physician Assistant

## 2019-12-07 ENCOUNTER — Telehealth: Payer: Self-pay | Admitting: *Deleted

## 2019-12-07 ENCOUNTER — Ambulatory Visit (INDEPENDENT_AMBULATORY_CARE_PROVIDER_SITE_OTHER): Payer: Medicare Other | Admitting: *Deleted

## 2019-12-07 DIAGNOSIS — Z7901 Long term (current) use of anticoagulants: Secondary | ICD-10-CM

## 2019-12-07 DIAGNOSIS — I480 Paroxysmal atrial fibrillation: Secondary | ICD-10-CM | POA: Diagnosis not present

## 2019-12-07 DIAGNOSIS — I5032 Chronic diastolic (congestive) heart failure: Secondary | ICD-10-CM | POA: Diagnosis not present

## 2019-12-07 LAB — PROTIME-INR: INR: 1.7 — AB (ref 0.9–1.1)

## 2019-12-07 NOTE — Telephone Encounter (Signed)
[place] Visit Follow Up Request   Date of Request ([place]):  December 07, 2019  Requesting Provider: Ermalinda Barrios   Agency Requested:    [facility] Contact:  Glory Buff, NP [address] [place], [place] [address] Phone #:  [phone] Fax #:  [phone]  Patient Demographic Information: Name:  Ricardo Valencia Age:  86 y.o.   DOB:  10-14-1928  MRN:  [id number]   Home visit progress note(s), lab results, telemetry strips, etc were reviewed.  Provider Recommendations: Please obtain a PT/INR on 12/21/2019  Follow up home services requested:  Labs:  PT/INR  All labs ordered for this home visit have been released and the request was sent to Chrissie Noa at [facility].

## 2019-12-18 ENCOUNTER — Other Ambulatory Visit: Payer: Self-pay | Admitting: Cardiovascular Disease

## 2019-12-21 DIAGNOSIS — I4819 Other persistent atrial fibrillation: Secondary | ICD-10-CM | POA: Diagnosis not present

## 2019-12-21 LAB — PROTIME-INR: INR: 2.5 — AB (ref <=1.1)

## 2019-12-22 ENCOUNTER — Ambulatory Visit (INDEPENDENT_AMBULATORY_CARE_PROVIDER_SITE_OTHER): Payer: Medicare Other | Admitting: *Deleted

## 2019-12-22 DIAGNOSIS — Z7901 Long term (current) use of anticoagulants: Secondary | ICD-10-CM | POA: Diagnosis not present

## 2019-12-22 LAB — PROTIME-INR
INR: 2.5 — ABNORMAL HIGH (ref 0.9–1.2)
Prothrombin Time: 26.1 s — ABNORMAL HIGH (ref 9.1–12.0)

## 2019-12-23 ENCOUNTER — Telehealth: Payer: Self-pay | Admitting: Pharmacist

## 2019-12-23 DIAGNOSIS — I480 Paroxysmal atrial fibrillation: Secondary | ICD-10-CM

## 2019-12-23 NOTE — Telephone Encounter (Signed)
[place] Visit Follow Up Request   Date of Request ([place]):  December 23, 2019  Requesting Provider: Ermalinda Barrios   Agency Requested:    [facility] Contact:  Glory Buff, NP [address] [place], [place] [address] Phone #:  [phone] Fax #:  [phone]  Patient Demographic Information: Name:  Ricardo Valencia Age:  86 y.o.   DOB:  09/29/1928  MRN:  [id number]   Home visit progress note(s), lab results, telemetry strips, etc were reviewed.  Provider Recommendations: Please obtain a PT/INR on 01/11/2020  Follow up home services requested:  Labs:  PT/INR  All labs ordered for this home visit have been released and the request was sent to Chrissie Noa at [facility].

## 2019-12-23 NOTE — Patient Instructions (Signed)
Description   Spoke with pt's wife and advised him to continue taking 1 tablet daily except 1.5 tablets on Sundays, Wednesdays, and Fridays. Recheck INR in 2 weeks with [facility]order sent today electronically via Epic (fax [phone]). Call us with any medication changes or concern [phone] Coumadin Clinic, Main # [phone].

## 2020-01-11 DIAGNOSIS — Z7901 Long term (current) use of anticoagulants: Secondary | ICD-10-CM | POA: Diagnosis not present

## 2020-01-11 DIAGNOSIS — I5032 Chronic diastolic (congestive) heart failure: Secondary | ICD-10-CM | POA: Diagnosis not present

## 2020-01-11 LAB — PROTIME-INR: INR: 2.5 — AB (ref <=1.1)

## 2020-01-12 ENCOUNTER — Ambulatory Visit (INDEPENDENT_AMBULATORY_CARE_PROVIDER_SITE_OTHER): Payer: Medicare Other | Admitting: Pharmacist

## 2020-01-12 ENCOUNTER — Telehealth: Payer: Self-pay | Admitting: Pharmacist

## 2020-01-12 DIAGNOSIS — I2510 Atherosclerotic heart disease of native coronary artery without angina pectoris: Secondary | ICD-10-CM

## 2020-01-12 DIAGNOSIS — I480 Paroxysmal atrial fibrillation: Secondary | ICD-10-CM

## 2020-01-12 DIAGNOSIS — Z7901 Long term (current) use of anticoagulants: Secondary | ICD-10-CM

## 2020-01-12 NOTE — Telephone Encounter (Signed)
[place] Visit Follow Up Request  Date of Request ([place]): January 12, 2020  Requesting Provider:Michele Bonnell Public  Agency Requested:   [facility] Contact: Glory Buff, NP [place], [place] [address] Phone #: [phone] Fax #: [phone]  Patient Demographic Information: Name:Ricardo Valencia Age:86 y.o. DOB:1928-12-09 [id number]  Home visit progress note(s), lab results, telemetry strips, etc were reviewed.  Provider Recommendations: Please obtain a PT/INR on 02/08/2020  Follow up home services requested:  Labs:PT/INR  All labs ordered for this home visit have been released and the request was sent to Chrissie Noa at [facility].

## 2020-01-12 NOTE — Patient Instructions (Signed)
Description   Spoke with pt's wife and advised him to continue taking 1 tablet daily except 1.5 tablets on Sundays, Wednesdays, and Fridays. Recheck INR in 4 weeks with [facility]-order sent today electronically via Epic (fax #[phone]). Call us with any medication changes or concern #[phone] Coumadin Clinic, Main # [phone].      

## 2020-01-15 ENCOUNTER — Other Ambulatory Visit: Payer: Self-pay | Admitting: Cardiovascular Disease

## 2020-01-15 MED ORDER — POTASSIUM CHLORIDE CRYS ER 10 MEQ PO TBCR: 10.0000 meq | EXTENDED_RELEASE_TABLET | Freq: Every day | ORAL | 3 refills | Status: DC

## 2020-01-17 ENCOUNTER — Other Ambulatory Visit: Payer: Self-pay | Admitting: Family Medicine

## 2020-01-17 ENCOUNTER — Other Ambulatory Visit (HOSPITAL_COMMUNITY): Payer: Self-pay | Admitting: Cardiology

## 2020-02-08 ENCOUNTER — Telehealth: Payer: Self-pay | Admitting: Pharmacist

## 2020-02-08 ENCOUNTER — Ambulatory Visit (INDEPENDENT_AMBULATORY_CARE_PROVIDER_SITE_OTHER): Payer: Medicare Other | Admitting: Pharmacist

## 2020-02-08 DIAGNOSIS — Z7901 Long term (current) use of anticoagulants: Secondary | ICD-10-CM | POA: Diagnosis not present

## 2020-02-08 DIAGNOSIS — I480 Paroxysmal atrial fibrillation: Secondary | ICD-10-CM | POA: Diagnosis not present

## 2020-02-08 LAB — PROTIME-INR
INR: 3.4 — ABNORMAL HIGH (ref 0.9–1.2)
Prothrombin Time: 35.9 s — ABNORMAL HIGH (ref 9.1–12.0)

## 2020-02-08 NOTE — Telephone Encounter (Signed)
[place] Visit Follow Up Request   Date of Request ([place]):  Feb 08, 2020  Requesting Provider:  Nena Polio    Agency Requested:    [facility] Contact:  Glory Buff, NP [address] [place], [place] [address] Phone #:  [phone] Fax #:  [phone]  Patient Demographic Information: Name:  Ricardo Valencia Age:  86 y.o.   DOB:  08-21-1929  MRN:  [id number]   Home visit progress note(s), lab results, telemetry strips, etc were reviewed.  Provider Recommendations: INR draw on 02/29/20  Follow up home services requested:  Labs:  INR  All labs ordered for this home visit have been released and the request was sent to Chrissie Noa at [facility].

## 2020-02-29 DIAGNOSIS — I5032 Chronic diastolic (congestive) heart failure: Secondary | ICD-10-CM | POA: Diagnosis not present

## 2020-02-29 DIAGNOSIS — Z7901 Long term (current) use of anticoagulants: Secondary | ICD-10-CM | POA: Diagnosis not present

## 2020-02-29 DIAGNOSIS — I10 Essential (primary) hypertension: Secondary | ICD-10-CM | POA: Diagnosis not present

## 2020-02-29 NOTE — Progress Notes (Signed)
Virtual Visit via Video Note   This visit type was conducted due to national recommendations for restrictions regarding the COVID-19 Pandemic (e.g. social distancing) in an effort to limit this patient's exposure and mitigate transmission in our community.  Due to his co-morbid illnesses, this patient is at least at moderate risk for complications without adequate follow up.  This format is felt to be most appropriate for this patient at this time.  All issues noted in this document were discussed and addressed.  A limited physical exam was performed with this format.  Please refer to the patient's chart for his consent to telehealth for [facility].   The patient was identified using 2 identifiers.  Date:  03/01/2020   ID:  Ricardo Valencia, DOB [birth year], MRN [id number]  Patient Location: Home Provider Location: Office  PCP:  Eulas Post, MD  Cardiologist:  Lauree Chandler, MD  Electrophysiologist:  None   Evaluation Performed:  Follow-Up Visit  Chief Complaint:  Follow up  History of Present Illness:    Ricardo Valencia is a 86 y.o. male with with persistent atrial fibrillation, CAD status post DES to the mid LAD 2016, echo at that time LVEF 25 to 30% improved to 45 to 50% atrial fibrillation 2016 cardioverted back to normal sinus rhythm.  Patient had recurrent atrial fibrillation and was on Tikosyn but stopped because of prolonged QT interval and now is on amiodarone. Last echo 06/2017 EF 55 to 60% with basal inferior akinesis mildly dilated RV and mildly decreased RV systolic function.   I  stopped his toprol and decreased amiodarone due to slow pulse and multiple falls resulting in L3 lumbar fracture and rib fractures. He's also struggled with diastolic CHF.  Patient's BNP has remained elevated despite lack of symptoms. LOV 09/30/19 and I didn't make any changes.   Patient has had his covid 19 vaccine. Looking forward to seeing their children. Walking outside  some. No chest pain, shortness of breath, dizziness, edema.palpitations.  Having some internal right sided facial pain from shingles-following up with PCP. Causes trouble with chewing.   The patient does not have symptoms concerning for COVID-19 infection (fever, chills, cough, or new shortness of breath).    Past Medical History:  Diagnosis Date  . CAD (coronary artery disease)    DES to mid LAD 11/10/2014  . Chronic systolic heart failure ([place])   . Hay fever    hay fever, allergies  . History of echocardiogram    Echo 3/17: EF 40%, diff HK, trivial AI, trivial MR, mod LAE, mild reduced RVSF, mod RAE  . Hyperlipidemia   . Hypertension   . Persistent atrial fibrillation [facility])    Past Surgical History:  Procedure Laterality Date  . APPENDECTOMY  1947  . CARDIOVERSION N/A 12/30/2014   Procedure: CARDIOVERSION;  Surgeon: Jerline Pain, MD;  Location: [place];  Service: Cardiovascular;  Laterality: N/A;  . CORONARY ANGIOPLASTY WITH STENT PLACEMENT    . LEFT HEART CATHETERIZATION WITH CORONARY ANGIOGRAM N/A 11/10/2014   Procedure: LEFT HEART CATHETERIZATION WITH CORONARY ANGIOGRAM;  Surgeon: Wellington Hampshire, MD;  Location: [place] CATH LAB;  Service: Cardiovascular;  Laterality: N/A;  . PERCUTANEOUS CORONARY STENT INTERVENTION (PCI-S)  11/10/2014   Procedure: PERCUTANEOUS CORONARY STENT INTERVENTION (PCI-S);  Surgeon: Wellington Hampshire, MD;  Location: [facility] CATH LAB;  Service: Cardiovascular;;     Current Meds  Medication Sig  . amiodarone (PACERONE) 200 MG tablet Take 1 tablet (200 mg total) by mouth daily.  Must be seen in office for further refills  . Ascorbic Acid (VITAMIN C) 1000 MG tablet Take 1,000 mg by mouth daily.  Marland Kitchen atorvastatin (LIPITOR) 80 MG tablet TAKE 1 TABLET BY MOUTH ONCE DAILY AT  6PM  . calcium carbonate (TUMS) 500 MG chewable tablet Chew 1 tablet (200 mg of elemental calcium total) by mouth daily.  . furosemide (LASIX) 40 MG tablet Take 1.5 tablets (60 mg total) by mouth  daily.  Marland Kitchen gabapentin (NEURONTIN) 300 MG capsule Take 1 capsule (300 mg total) by mouth 2 (two) times daily.  Marland Kitchen levothyroxine (SYNTHROID) 25 MCG tablet TAKE 1 TABLET BY MOUTH ONCE DAILY BEFORE BREAKFAST  . lisinopril (ZESTRIL) 2.5 MG tablet Take 1 tablet (2.5 mg total) by mouth daily.  . nitroGLYCERIN (NITROSTAT) 0.4 MG SL tablet Place 1 tablet (0.4 mg total) under the tongue every 5 (five) minutes x 3 doses as needed for chest pain.  . potassium chloride (KLOR-CON) 10 MEQ tablet Take 1 tablet (10 mEq total) by mouth daily.  . primidone (MYSOLINE) 50 MG tablet Take 1 tablet (50 mg total) by mouth 2 (two) times daily. Take at 10am and 6pm  . warfarin (COUMADIN) 5 MG tablet TAKE AS DIRECTED BY  COUMADIN  CLINIC     Allergies:   Patient has no known allergies.   Social History   Tobacco Use  . Smoking status: Never Smoker  . Smokeless tobacco: Never Used  . Tobacco comment: "smoked when I was 22"  Vaping Use  . Vaping Use: Never used  Substance Use Topics  . Alcohol use: Yes    Alcohol/week: 7.0 - 14.0 standard drinks    Types: 7 - 14 Cans of beer per week    Comment: occ  . Drug use: No     Family Hx: The patient's family history includes Cancer in his father and mother.  ROS:   Please see the history of present illness.      All other systems reviewed and are negative.   Prior CV studies:   The following studies were reviewed today:   Echo 11/2018  IMPRESSIONS      1. The left ventricle has normal systolic function, with an ejection fraction of 55-60%. The cavity size was normal. Left ventricular diastolic Doppler parameters are consistent with pseudonormalization.  2. The right ventricle has normal systolic function. The cavity was normal. There is no increase in right ventricular wall thickness.  3. Left atrial size was moderately dilated.  4. The mitral valve is normal in structure. Mild thickening of the mitral valve leaflet. Mild calcification of the mitral valve  leaflet.  5. The tricuspid valve is normal in structure.  6. The aortic valve has an indeterminant number of cusps Moderate thickening of the aortic valve Moderate calcification of the aortic valve. Aortic valve regurgitation is mild by color flow Doppler. mild stenosis of the aortic valve.  7. The pulmonic valve was grossly normal. Pulmonic valve regurgitation is mild by color flow Doppler.  8. There is mild dilatation of the aortic root measuring 39 mm.   FINDINGS  Left Ventricle: The left ventricle has normal systolic function, with an ejection fraction of 55-60%. The cavity size was normal. There is no increase in left ventricular wall thickness. Left ventricular diastolic Doppler parameters are consistent with  pseudonormalization Right Ventricle: The right ventricle has normal systolic function. The cavity was normal. There is no increase in right ventricular wall thickness. Left Atrium: left atrial size was moderately dilated  Right Atrium: right atrial size was normal in size. Right atrial pressure is estimated at 3 mmHg. Interatrial Septum: No atrial level shunt detected by color flow Doppler. Pericardium: There is no evidence of pericardial effusion. Mitral Valve: The mitral valve is normal in structure. Mild thickening of the mitral valve leaflet. Mild calcification of the mitral valve leaflet. Mitral valve regurgitation is mild by color flow Doppler. Tricuspid Valve: The tricuspid valve is normal in structure. Tricuspid valve regurgitation is mild by color flow Doppler. Aortic Valve: The aortic valve has an indeterminant number of cusps Moderate thickening of the aortic valve Moderate calcification of the aortic valve. Aortic valve regurgitation is mild by color flow Doppler. There is mild stenosis of the aortic valve,  with a calculated valve area of 1.93 cm. Pulmonic Valve: The pulmonic valve was grossly normal. Pulmonic valve regurgitation is mild by color flow Doppler. Aorta: There  is mild dilatation of the aortic root measuring 39 mm. Venous: The inferior vena cava is normal in size with greater than 50% respiratory variability. Compared to previous exam: 06/27/17 EF 55-60%.             Labs/Other Tests and Data Reviewed:    EKG:    Recent Labs: 10/05/2019: BUN 17; Creatinine, Ser 1.94; NT-Pro BNP 1,922; Potassium 4.1; Sodium 139 11/02/2019: Hemoglobin 12.7; Platelets 164   Recent Lipid Panel Lab Results  Component Value Date/Time   CHOL 178 10/01/2017 01:17 PM   TRIG 108 10/01/2017 01:17 PM   HDL 63 10/01/2017 01:17 PM   CHOLHDL 2.8 10/01/2017 01:17 PM   LDLCALC 93 10/01/2017 01:17 PM   LDLDIRECT 191.1 01/04/2011 12:18 PM    Wt Readings from Last 3 Encounters:  03/01/20 172 lb (78 kg)  11/24/19 172 lb (78 kg)  09/30/19 173 lb 8 oz (78.7 kg)     Objective:    Vital Signs:  BP 112/60   Pulse (!) 56   Ht 5\' 8"  (1.727 m)   Wt 172 lb (78 kg)   BMI 26.15 kg/m    VITAL SIGNS:  reviewed GEN:  no acute distress RESPIRATORY:  normal respiratory effort, symmetric expansion CARDIOVASCULAR:  no peripheral edema  ASSESSMENT & PLAN:    Chronic diastolic CHF compensated no edema, no dyspnea. No changes. Will have home health check bmet and CBC when they check INR next   Persistent atrial fibrillation with bradycardia Toprol had to be stopped and amiodarone decreased. Also on Coumadin-managed by coumadin clinic-INR 2.5 yesterday   CAD without angina    Falls with fractures 11/2018 and 01/2019 no falls since and stable watching closely on Coumadin   CKD stage 3 Crt 1.94-1/18/21-recheck.              COVID-19 Education: The signs and symptoms of COVID-19 were discussed with the patient and how to seek care for testing (follow up with PCP or arrange E-visit).   The importance of social distancing was discussed today.  Time:   Today, I have spent 10 minutes with the patient with telehealth technology discussing the above problems.      Medication Adjustments/Labs and Tests Ordered: Current medicines are reviewed at length with the patient today.  Concerns regarding medicines are outlined above.   Tests Ordered: Orders Placed This Encounter  Procedures  . LAB REPORT - SCANNED  . Basic metabolic panel  . CBC    Medication Changes: No orders of the defined types were placed in this encounter.   Follow Up:  In  Person in 3 month(s) with Ricardo Barrios PA-C/Dr. Angelena Form  Signed, Ricardo Barrios, PA-C  03/01/2020 1:32 PM    [facility]

## 2020-03-01 ENCOUNTER — Telehealth: Payer: Self-pay

## 2020-03-01 ENCOUNTER — Ambulatory Visit (INDEPENDENT_AMBULATORY_CARE_PROVIDER_SITE_OTHER): Payer: Medicare Other | Admitting: Pharmacist

## 2020-03-01 ENCOUNTER — Encounter: Payer: Self-pay | Admitting: Physician Assistant

## 2020-03-01 ENCOUNTER — Telehealth (INDEPENDENT_AMBULATORY_CARE_PROVIDER_SITE_OTHER): Payer: Medicare Other | Admitting: Physician Assistant

## 2020-03-01 ENCOUNTER — Other Ambulatory Visit: Payer: Self-pay

## 2020-03-01 VITALS — BP 112/60 | HR 56 | Ht 68.0 in | Wt 172.0 lb

## 2020-03-01 DIAGNOSIS — I2510 Atherosclerotic heart disease of native coronary artery without angina pectoris: Secondary | ICD-10-CM | POA: Diagnosis not present

## 2020-03-01 DIAGNOSIS — Z9181 History of falling: Secondary | ICD-10-CM | POA: Diagnosis not present

## 2020-03-01 DIAGNOSIS — I4811 Longstanding persistent atrial fibrillation: Secondary | ICD-10-CM

## 2020-03-01 DIAGNOSIS — Z7901 Long term (current) use of anticoagulants: Secondary | ICD-10-CM

## 2020-03-01 DIAGNOSIS — I5032 Chronic diastolic (congestive) heart failure: Secondary | ICD-10-CM | POA: Diagnosis not present

## 2020-03-01 DIAGNOSIS — N183 Chronic kidney disease, stage 3 unspecified: Secondary | ICD-10-CM | POA: Diagnosis not present

## 2020-03-01 LAB — POCT INR: INR: 2.5 (ref 2.0–3.0)

## 2020-03-01 NOTE — Patient Instructions (Signed)
Medication Instructions:  Your physician recommends that you continue on your current medications as directed. Please refer to the Current Medication list given to you today.  *If you need a refill on your cardiac medications before your next appointment, please call your pharmacy*   Lab Work: TO BE DONE BY HOME HEALTH NEXT TIME THEY COME OUT TO YOUR HOME: BMET, CBC  If you have labs (blood work) drawn today and your tests are completely normal, you will receive your results only by: Marland Kitchen MyChart Message (if you have MyChart) OR . A paper copy in the mail If you have any lab test that is abnormal or we need to change your treatment, we will call you to review the results.   Testing/Procedures: NONE   Follow-Up: At [facility], you and your health needs are our priority.  As part of our continuing mission to provide you with exceptional heart care, we have created designated Provider Care Teams.  These Care Teams include your primary Cardiologist (physician) and Advanced Practice Providers (APPs -  Physician Assistants and Nurse Practitioners) who all work together to provide you with the care you need, when you need it.  We recommend signing up for the patient portal called "MyChart".  Sign up information is provided on this After Visit Summary.  MyChart is used to connect with patients for Virtual Visits (Telemedicine).  Patients are able to view lab/test results, encounter notes, upcoming appointments, etc.  Non-urgent messages can be sent to your provider as well.   To learn more about what you can do with MyChart, go to [email or web address].    Your next appointment:   3-4 month(s)  The format for your next appointment:   In Person  Provider:   Ermalinda Barrios, PA-C

## 2020-03-01 NOTE — Telephone Encounter (Signed)
Patient Consent for Virtual Visit    Mr. Hinshaw, you are scheduled for a virtual visit with your provider today.  Just as we do with appointments in the office, we must obtain your consent to participate.  Your consent will be active for this visit and any virtual visit you may have with one of our providers in the next 365 days.  If you have a MyChart account, I can also send a copy of this consent to you electronically.  All virtual visits are billed to your insurance company just like a traditional visit in the office.  As this is a virtual visit, video technology does not allow for your provider to perform a traditional examination.  This may limit your provider's ability to fully assess your condition.  If your provider identifies any concerns that need to be evaluated in person or the need to arrange testing such as labs, EKG, etc, we will make arrangements to do so.  Although advances in technology are sophisticated, we cannot ensure that it will always work on either your end or our end.  If the connection with a video visit is poor, we may have to switch to a telephone visit.  With either a video or telephone visit, we are not always able to ensure that we have a secure connection.   I need to obtain your verbal consent now.   Are you willing to proceed with your visit today?   Ricardo Valencia has provided verbal consent on 03/01/2020 for a virtual visit (video or telephone).   CONSENT FOR VIRTUAL VISIT FOR:  Ricardo Valencia  By participating in this virtual visit I agree to the following:  I hereby voluntarily request, consent and authorize [facility] and its employed or contracted physicians, physician assistants, nurse practitioners or other licensed health care professionals (the Practitioner), to provide me with telemedicine health care services (the Services") as deemed necessary by the treating Practitioner. I acknowledge and consent to receive the Services by the  Practitioner via telemedicine. I understand that the telemedicine visit will involve communicating with the Practitioner through live audiovisual communication technology and the disclosure of certain medical information by electronic transmission. I acknowledge that I have been given the opportunity to request an in-person assessment or other available alternative prior to the telemedicine visit and am voluntarily participating in the telemedicine visit.  I understand that I have the right to withhold or withdraw my consent to the use of telemedicine in the course of my care at any time, without affecting my right to future care or treatment, and that the Practitioner or I may terminate the telemedicine visit at any time. I understand that I have the right to inspect all information obtained and/or recorded in the course of the telemedicine visit and may receive copies of available information for a reasonable fee.  I understand that some of the potential risks of receiving the Services via telemedicine include:   Delay or interruption in medical evaluation due to technological equipment failure or disruption;  Information transmitted may not be sufficient (e.g. poor resolution of images) to allow for appropriate medical decision making by the Practitioner; and/or   In rare instances, security protocols could fail, causing a breach of personal health information.  Furthermore, I acknowledge that it is my responsibility to provide information about my medical history, conditions and care that is complete and accurate to the best of my ability. I acknowledge that Practitioner's advice, recommendations, and/or decision may be based  on factors not within their control, such as incomplete or inaccurate data provided by me or distortions of diagnostic images or specimens that may result from electronic transmissions. I understand that the practice of medicine is not an exact science and that Practitioner makes  no warranties or guarantees regarding treatment outcomes. I acknowledge that a copy of this consent can be made available to me via my patient portal ([facility]), or I can request a printed copy by calling the office of [facility].    I understand that my insurance will be billed for this visit.   I have read or had this consent read to me.  I understand the contents of this consent, which adequately explains the benefits and risks of the Services being provided via telemedicine.   I have been provided ample opportunity to ask questions regarding this consent and the Services and have had my questions answered to my satisfaction.  I give my informed consent for the services to be provided through the use of telemedicine in my medical care

## 2020-03-02 ENCOUNTER — Telehealth: Payer: Self-pay | Admitting: Pharmacist

## 2020-03-02 ENCOUNTER — Other Ambulatory Visit: Payer: Self-pay | Admitting: *Deleted

## 2020-03-02 DIAGNOSIS — Z7901 Long term (current) use of anticoagulants: Secondary | ICD-10-CM

## 2020-03-02 MED ORDER — PRIMIDONE 50 MG PO TABS: 50.0000 mg | ORAL_TABLET | Freq: Two times a day (BID) | ORAL | 6 refills | Status: DC

## 2020-03-02 NOTE — Patient Instructions (Signed)
Description   Spoke with pt's wife and advised him to continue taking 1 tablet daily except 1.5 tablets on Sundays, Wednesdays, and Fridays. Recheck INR in 4 weeks with [facility]-order sent today electronically via Epic (fax [phone]). Call us with any medication changes or concern [phone] [facility], Main # [phone].

## 2020-03-02 NOTE — Telephone Encounter (Signed)
[place] Visit Follow Up Request   Date of Request ([place]):  March 02, 2020  Requesting Provider:  Ermalinda Barrios  Agency Requested:    [facility] Contact:  Glory Buff, NP [address] [place], [place] [address] Phone #:  [phone] Fax #:  [phone]  Patient Demographic Information: Name:  Ricardo Valencia Age:  86 y.o.   DOB:  [birth year]  MRN:  [id number]   Home visit progress note(s), lab results, telemetry strips, etc were reviewed.  Provider Recommendations: INR draw on 03/28/20  Follow up home services requested:  Labs:  INR  All labs ordered for this home visit have been released and the request was sent to Chrissie Noa at [facility].

## 2020-03-07 NOTE — Addendum Note (Signed)
Addended by: Verma Grothaus E on: 03/07/2020 07:59 AM   Modules accepted: Orders

## 2020-03-10 ENCOUNTER — Other Ambulatory Visit: Payer: Self-pay | Admitting: Family Medicine

## 2020-03-10 DIAGNOSIS — S32030D Wedge compression fracture of third lumbar vertebra, subsequent encounter for fracture with routine healing: Secondary | ICD-10-CM

## 2020-03-27 ENCOUNTER — Other Ambulatory Visit: Payer: Self-pay | Admitting: Family Medicine

## 2020-03-27 ENCOUNTER — Other Ambulatory Visit: Payer: Self-pay | Admitting: Cardiovascular Disease

## 2020-03-27 ENCOUNTER — Other Ambulatory Visit (HOSPITAL_COMMUNITY): Payer: Self-pay | Admitting: Cardiology

## 2020-03-28 ENCOUNTER — Ambulatory Visit (INDEPENDENT_AMBULATORY_CARE_PROVIDER_SITE_OTHER): Payer: Medicare Other | Admitting: Cardiology

## 2020-03-28 DIAGNOSIS — Z7901 Long term (current) use of anticoagulants: Secondary | ICD-10-CM | POA: Diagnosis not present

## 2020-03-28 LAB — PROTIME-INR: INR: 4.4 — AB (ref 0.9–1.1)

## 2020-04-11 ENCOUNTER — Telehealth: Payer: Self-pay | Admitting: *Deleted

## 2020-04-11 ENCOUNTER — Ambulatory Visit (INDEPENDENT_AMBULATORY_CARE_PROVIDER_SITE_OTHER): Payer: Medicare Other | Admitting: *Deleted

## 2020-04-11 DIAGNOSIS — Z7901 Long term (current) use of anticoagulants: Secondary | ICD-10-CM

## 2020-04-11 DIAGNOSIS — Z791 Long term (current) use of non-steroidal anti-inflammatories (NSAID): Secondary | ICD-10-CM | POA: Diagnosis not present

## 2020-04-11 DIAGNOSIS — I5032 Chronic diastolic (congestive) heart failure: Secondary | ICD-10-CM | POA: Diagnosis not present

## 2020-04-11 DIAGNOSIS — I4819 Other persistent atrial fibrillation: Secondary | ICD-10-CM

## 2020-04-11 LAB — PROTIME-INR: INR: 3.1 — AB (ref 0.9–1.1)

## 2020-04-11 NOTE — Telephone Encounter (Signed)
[place] Visit Follow Up Request   Date of Request ([place]):  April 11, 2020  Requesting Provider:   Estella Husk, PA  Agency Requested:    [facility] Contact:  Glory Buff, NP [address] [place], [place] [address] Phone #:  [phone] Fax #:  [phone]  Patient Demographic Information: Name:  Ricardo Valencia Age:  86 y.o.   DOB:  [birth year]  MRN:  [id number]   Home visit progress note(s), lab results, telemetry strips, etc were reviewed.  Provider Recommendations: INR draw on 04/25/2020  Follow up home services requested:  Labs:  INR  All labs ordered for this home visit have been released and the request was sent to Chrissie Noa at [facility].

## 2020-04-25 ENCOUNTER — Telehealth: Payer: Self-pay | Admitting: *Deleted

## 2020-04-25 ENCOUNTER — Ambulatory Visit (INDEPENDENT_AMBULATORY_CARE_PROVIDER_SITE_OTHER): Payer: Medicare Other | Admitting: *Deleted

## 2020-04-25 DIAGNOSIS — Z7901 Long term (current) use of anticoagulants: Secondary | ICD-10-CM

## 2020-04-25 DIAGNOSIS — I4819 Other persistent atrial fibrillation: Secondary | ICD-10-CM

## 2020-04-25 DIAGNOSIS — Z5181 Encounter for therapeutic drug level monitoring: Secondary | ICD-10-CM

## 2020-04-25 LAB — PROTIME-INR: INR: 2.7 — AB (ref 0.9–1.1)

## 2020-04-25 NOTE — Telephone Encounter (Signed)
[place] Visit Follow Up Request   Date of Request ([place]):  April 25, 2020  Requesting Provider: Estella Husk, PA  Agency Requested:    [facility] Contact:  Glory Buff, NP [address] [place], [place] [address] Phone #:  [phone] Fax #:  [phone]  Patient Demographic Information: Name:  Ricardo Valencia Age:  86 y.o.   DOB:  03/22/1929  MRN:  [id number]   Home visit progress note(s), lab results, telemetry strips, etc were reviewed.  Provider Recommendations: INR draw on 05/16/2020  Follow up home services requested:  Labs:  PT/INR  All labs ordered for this home visit have been released and the request was sent to Chrissie Noa at [facility].

## 2020-04-25 NOTE — Patient Instructions (Signed)
Description   Spoke with pt and pt's wife and advised him to continue taking Warfarin 1 tablet daily except 1.5 tablets on Sundays and Fridays. Recheck INR in 3 weeks with [facility]-order (fax [phone]). Call us with any medication changes or concern [phone] [facility], Main # [phone].

## 2020-05-16 ENCOUNTER — Telehealth: Payer: Self-pay | Admitting: *Deleted

## 2020-05-16 ENCOUNTER — Ambulatory Visit (INDEPENDENT_AMBULATORY_CARE_PROVIDER_SITE_OTHER): Payer: Medicare Other | Admitting: *Deleted

## 2020-05-16 DIAGNOSIS — Z7901 Long term (current) use of anticoagulants: Secondary | ICD-10-CM

## 2020-05-16 DIAGNOSIS — I4819 Other persistent atrial fibrillation: Secondary | ICD-10-CM

## 2020-05-16 DIAGNOSIS — Z5181 Encounter for therapeutic drug level monitoring: Secondary | ICD-10-CM | POA: Diagnosis not present

## 2020-05-16 LAB — PROTIME-INR: INR: 2.6 — AB (ref <=1.1)

## 2020-05-16 NOTE — Telephone Encounter (Signed)
[place] Visit Follow Up Request   Date of Request ([place]):  May 16, 2020  Requesting Provider:  Estella Husk, PA    Agency Requested:    [facility] Contact:  Glory Buff, NP [address] [place], [place] [address] Phone #:  [phone] Fax #:  [phone]  Patient Demographic Information: Name:  Ricardo Valencia Age:  86 y.o.   DOB:  12-29-1928  MRN:  [id number]   Home visit progress note(s), lab results, telemetry strips, etc were reviewed.  Provider Recommendations: INR draw on 06/13/2020  Follow up home services requested:  Labs:  PT/INR  All labs ordered for this home visit have been released and the request was sent to Chrissie Noa at [facility].

## 2020-06-03 ENCOUNTER — Other Ambulatory Visit: Payer: Self-pay | Admitting: Family Medicine

## 2020-06-05 ENCOUNTER — Other Ambulatory Visit: Payer: Self-pay | Admitting: Family Medicine

## 2020-06-09 ENCOUNTER — Other Ambulatory Visit: Payer: Self-pay | Admitting: Family Medicine

## 2020-06-09 ENCOUNTER — Other Ambulatory Visit (HOSPITAL_COMMUNITY): Payer: Self-pay | Admitting: Cardiology

## 2020-06-13 DIAGNOSIS — I480 Paroxysmal atrial fibrillation: Secondary | ICD-10-CM | POA: Diagnosis not present

## 2020-06-13 LAB — PROTIME-INR: INR: 3.6 — AB (ref 0.9–1.1)

## 2020-06-14 ENCOUNTER — Other Ambulatory Visit (HOSPITAL_COMMUNITY): Payer: Self-pay | Admitting: Cardiology

## 2020-06-14 ENCOUNTER — Other Ambulatory Visit: Payer: Self-pay | Admitting: Physician Assistant

## 2020-06-15 ENCOUNTER — Telehealth: Payer: Self-pay | Admitting: Physician Assistant

## 2020-06-15 NOTE — Telephone Encounter (Signed)
Will send to primary card nurse as I am not sure of what results pt is calling about.

## 2020-06-15 NOTE — Telephone Encounter (Signed)
New message:     Patient calling to get some results. Please call patient back.

## 2020-06-16 ENCOUNTER — Ambulatory Visit (INDEPENDENT_AMBULATORY_CARE_PROVIDER_SITE_OTHER): Payer: Medicare Other | Admitting: *Deleted

## 2020-06-16 ENCOUNTER — Telehealth: Payer: Self-pay | Admitting: *Deleted

## 2020-06-16 DIAGNOSIS — Z7901 Long term (current) use of anticoagulants: Secondary | ICD-10-CM | POA: Diagnosis not present

## 2020-06-16 DIAGNOSIS — I4819 Other persistent atrial fibrillation: Secondary | ICD-10-CM

## 2020-06-16 DIAGNOSIS — I480 Paroxysmal atrial fibrillation: Secondary | ICD-10-CM

## 2020-06-16 NOTE — Telephone Encounter (Signed)
Called [facility] cause we have not received any INR results from them on the pt. Spoke with Ebony Hail at [employer] [facility] and she stated the labs were drawn Monday and she could not see the results from [place] so she was going to call them and update [place] or fax the results once she receives them.

## 2020-06-16 NOTE — Telephone Encounter (Signed)
[place] Visit Follow Up Request   Date of Request ([place]):  June 16, 2020  Requesting Provider:  Estella Husk, PA    Agency Requested:    [facility] Contact:  Glory Buff, NP [address] [place], [place] [address] Phone #:  [phone] Fax #:  [phone]  Patient Demographic Information: Name:  LEMONT SITZMANN Age:  86 y.o.   DOB:  Nov 20, 1928  MRN:  [id number]   Home visit progress note(s), lab results, telemetry strips, etc were reviewed.  Provider Recommendations: INR draw on 07/04/2020 fax to [phone] or call [phone]  Follow up home services requested:  Labs:  PT/INR  All labs ordered for this home visit have been released and the request was sent to Chrissie Noa at [facility].

## 2020-06-16 NOTE — Telephone Encounter (Signed)
Spoke with pt and gave an update on lab results and dosing instructions. Please refer to Anticoagulation Encounter from today.

## 2020-06-16 NOTE — Telephone Encounter (Signed)
Called pt and updated him that we spoke with [facility] and they are contacting [facility] for the labs and will get back to [place] and once they do we will call him; he verbalized understanding.

## 2020-06-16 NOTE — Addendum Note (Signed)
Addended by: Derrel Nip B on: 06/16/2020 01:55 PM   Modules accepted: Orders

## 2020-06-16 NOTE — Telephone Encounter (Signed)
Called pt who states [facility] drew labs in Monday. We have not been sent any results. Ricardo Valencia called [facility] who stated they could see they were drawn but no results. They said they would call [facility].

## 2020-06-16 NOTE — Patient Instructions (Signed)
Description   Spoke with pt and advised him to hold today's dose then continue taking Warfarin 1 tablet daily except 1.5 tablets on Sundays and Fridays. Recheck INR in 3 weeks with [facility]-order (fax [phone]). Call us with any medication changes or concern [phone] Cou[facility]in Clinic, Main # [phone].

## 2020-06-20 ENCOUNTER — Telehealth: Payer: Self-pay | Admitting: Family Medicine

## 2020-06-20 NOTE — Telephone Encounter (Signed)
Patient needs a refill for levothyroxine (SYNTHROID) 25 MCG tablet

## 2020-06-20 NOTE — Telephone Encounter (Signed)
Last OV 02/17/2019  Last TSH was checked and was not in normal limits.  OK to refill and have an in person visit?

## 2020-06-20 NOTE — Telephone Encounter (Signed)
Needs follow up.  Set up follow up-30 minutes.  Will need lab then.

## 2020-06-21 NOTE — Telephone Encounter (Signed)
Patient is scheduled for 06/24/2020 at 1:15 PM with Dr. [name]

## 2020-06-21 NOTE — Telephone Encounter (Signed)
Please contact and schedule patient per Dr. [name] request. Thank you!

## 2020-06-24 ENCOUNTER — Telehealth (INDEPENDENT_AMBULATORY_CARE_PROVIDER_SITE_OTHER): Payer: Medicare Other | Admitting: Family Medicine

## 2020-06-24 DIAGNOSIS — I2510 Atherosclerotic heart disease of native coronary artery without angina pectoris: Secondary | ICD-10-CM

## 2020-06-24 DIAGNOSIS — I10 Essential (primary) hypertension: Secondary | ICD-10-CM | POA: Diagnosis not present

## 2020-06-24 DIAGNOSIS — B0229 Other postherpetic nervous system involvement: Secondary | ICD-10-CM | POA: Diagnosis not present

## 2020-06-24 DIAGNOSIS — E785 Hyperlipidemia, unspecified: Secondary | ICD-10-CM

## 2020-06-24 DIAGNOSIS — Z79899 Other long term (current) drug therapy: Secondary | ICD-10-CM | POA: Diagnosis not present

## 2020-06-24 DIAGNOSIS — E032 Hypothyroidism due to medicaments and other exogenous substances: Secondary | ICD-10-CM | POA: Diagnosis not present

## 2020-06-24 MED ORDER — GABAPENTIN 300 MG PO CAPS
300.0000 mg | ORAL_CAPSULE | Freq: Three times a day (TID) | ORAL | 5 refills | Status: DC
Start: 2020-06-24 — End: 2020-12-15

## 2020-06-24 MED ORDER — LEVOTHYROXINE SODIUM 25 MCG PO TABS
ORAL_TABLET | ORAL | 3 refills | Status: DC
Start: 2020-06-24 — End: 2020-09-28

## 2020-06-24 NOTE — Progress Notes (Signed)
Patient ID: Ricardo Valencia, male   DOB: August 17, 1929, 86 y.o.   MRN: [id number]   This visit type was conducted due to national recommendations for restrictions regarding the COVID-19 pandemic in an effort to limit this patient's exposure and mitigate transmission in our community.   Virtual Visit via Video Note  I connected with Edwena Blow on 06/24/20 at  1:15 PM EDT by a video enabled telemedicine application and verified that I am speaking with the correct person using two identifiers.  Location patient: home Location provider:work or home office Persons participating in the virtual visit: patient, provider  I discussed the limitations of evaluation and management by telemedicine and the availability of in person appointments. The patient expressed understanding and agreed to proceed.   HPI:  Patient is seen accompanied by wife who assisted with this call. They have been very isolated in general since the pandemic. They have someone with remote health lab coming out to monitor his protimes and these are being forwarded to cardiology. His chronic problems include history of CAD, chronic diastolic and systolic heart failure, hypertension, history of atrial fibrillation, hypothyroidism, postherpetic neuralgia right side of face following shingles several years ago. He has had prior history of compression fractures. Has history of dyslipidemia treated with atorvastatin. He is overdue for labs. Has been over a year since last thyroid were checked. Is on low-dose levothyroxine 25 mcg daily. He is on amiodarone for his atrial fibrillation  Remains on gabapentin 300 mg twice daily for his neuropathy pain and sometimes takes a 3rd tablet and has been running out early. They would like to have this refilled. He has tried topicals such as Zostrix without improvement. He is not a good candidate for tricyclic's because of his age. They tried Cymbalta previously without relief.  He has not had any  recent falls. No recent peripheral edema issues.  ROS: See pertinent positives and negatives per HPI.  Past Medical History:  Diagnosis Date  . CAD (coronary artery disease)    DES to mid LAD 11/10/2014  . Chronic systolic heart failure ([place])   . Hay fever    hay fever, allergies  . History of echocardiogram    Echo 3/17: EF 40%, diff HK, trivial AI, trivial MR, mod LAE, mild reduced RVSF, mod RAE  . Hyperlipidemia   . Hypertension   . Persistent atrial fibrillation [facility])     Past Surgical History:  Procedure Laterality Date  . APPENDECTOMY  1947  . CARDIOVERSION N/A 12/30/2014   Procedure: CARDIOVERSION;  Surgeon: Jerline Pain, MD;  Location: [place];  Service: Cardiovascular;  Laterality: N/A;  . CORONARY ANGIOPLASTY WITH STENT PLACEMENT    . LEFT HEART CATHETERIZATION WITH CORONARY ANGIOGRAM N/A 11/10/2014   Procedure: LEFT HEART CATHETERIZATION WITH CORONARY ANGIOGRAM;  Surgeon: Wellington Hampshire, MD;  Location: [place] CATH LAB;  Service: Cardiovascular;  Laterality: N/A;  . PERCUTANEOUS CORONARY STENT INTERVENTION (PCI-S)  11/10/2014   Procedure: PERCUTANEOUS CORONARY STENT INTERVENTION (PCI-S);  Surgeon: Wellington Hampshire, MD;  Location: [facility] CATH LAB;  Service: Cardiovascular;;    Family History  Problem Relation Age of Onset  . Cancer Mother        breast  . Cancer Father        colon    SOCIAL HX: Lives at home with his wife. Non-smoker.   Current Outpatient Medications:  .  Ascorbic Acid (VITAMIN C) 1000 MG tablet, Take 1,000 mg by mouth daily., Disp: , Rfl:  .  atorvastatin (  LIPITOR) 80 MG tablet, TAKE 1 TABLET BY MOUTH ONCE DAILY AT  6PM, Disp: 90 tablet, Rfl: 3 .  calcium carbonate (TUMS) 500 MG chewable tablet, Chew 1 tablet (200 mg of elemental calcium total) by mouth daily., Disp: 90 tablet, Rfl: 3 .  furosemide (LASIX) 40 MG tablet, TAKE 1 & 1/2 (ONE & ONE-HALF) TABLETS BY MOUTH ONCE DAILY, Disp: 135 tablet, Rfl: 0 .  gabapentin (NEURONTIN) 300 MG capsule,  Take 1 capsule (300 mg total) by mouth 3 (three) times daily., Disp: 90 capsule, Rfl: 5 .  levothyroxine (SYNTHROID) 25 MCG tablet, TAKE 1 TABLET BY MOUTH ONCE DAILY BEFORE BREAKFAST NEED AN APPT FOR REFILS, Disp: 30 tablet, Rfl: 3 .  lisinopril (ZESTRIL) 2.5 MG tablet, Take 1 tablet (2.5 mg total) by mouth daily., Disp: 90 tablet, Rfl: 3 .  nitroGLYCERIN (NITROSTAT) 0.4 MG SL tablet, Place 1 tablet (0.4 mg total) under the tongue every 5 (five) minutes x 3 doses as needed for chest pain., Disp: 25 tablet, Rfl: 2 .  PACERONE 200 MG tablet, TAKE 1 TABLET BY MOUTH ONCE DAILY . APPOINTMENT REQUIRED FOR FUTURE REFILLS, Disp: 30 tablet, Rfl: 0 .  potassium chloride (KLOR-CON) 10 MEQ tablet, Take 1 tablet (10 mEq total) by mouth daily., Disp: 90 tablet, Rfl: 3 .  primidone (MYSOLINE) 50 MG tablet, Take 1 tablet (50 mg total) by mouth 2 (two) times daily. Take at 10am and 6pm, Disp: 60 tablet, Rfl: 6 .  warfarin (COUMADIN) 5 MG tablet, TAKE AS DIRECTED BY COUMADIN CLINIC, Disp: 40 tablet, Rfl: 2  EXAM:  VITALS per patient if applicable:  GENERAL: alert, oriented, appears well and in no acute distress  HEENT: atraumatic, conjunttiva clear, no obvious abnormalities on inspection of external nose and ears  NECK: normal movements of the head and neck  LUNGS: on inspection no signs of respiratory distress, breathing rate appears normal, no obvious gross SOB, gasping or wheezing  CV: no obvious cyanosis  MS: moves all visible extremities without noticeable abnormality  PSYCH/NEURO: pleasant and cooperative, no obvious depression or anxiety, speech and thought processing grossly intact  ASSESSMENT AND PLAN:  Discussed the following assessment and plan:  Primary hypertension - Plan: Basic metabolic panel  Hypothyroidism due to medication - Plan: TSH  Dyslipidemia - Plan: Lipid panel, Hepatic function panel  High risk medication use - Plan: CBC with Differential/Platelet Multiple chronic  problems as above. He is overdue for labs. We have written future order for TSH, CBC, comprehensive metabolic panel, lipid panel. They will see if this can be drawn by home health agency that is going out to get his labs. If not they will schedule labs here  -Refill gabapentin for 300 mg 3 times daily #90 -Refill levothyroxine 25 mcg daily and schedule labs as above -Routine follow-up in 6 months and sooner as needed    I discussed the assessment and treatment plan with the patient. The patient was provided an opportunity to ask questions and all were answered. The patient agreed with the plan and demonstrated an understanding of the instructions.   The patient was advised to call back or seek an in-person evaluation if the symptoms worsen or if the condition fails to improve as anticipated.     Carolann Littler, MD

## 2020-07-06 ENCOUNTER — Other Ambulatory Visit: Payer: Self-pay

## 2020-07-06 ENCOUNTER — Ambulatory Visit (INDEPENDENT_AMBULATORY_CARE_PROVIDER_SITE_OTHER): Payer: Medicare Other | Admitting: *Deleted

## 2020-07-06 DIAGNOSIS — I480 Paroxysmal atrial fibrillation: Secondary | ICD-10-CM

## 2020-07-06 LAB — POCT INR: INR: 2.7 (ref 2.0–3.0)

## 2020-07-06 NOTE — Patient Instructions (Signed)
Description   Continue taking Warfarin 1 tablet daily except 1.5 tablets on Sundays and Fridays. Recheck INR in 4 weeks. Call us with any medication changes or concern [phone] [facility], Main # [phone].

## 2020-08-02 ENCOUNTER — Other Ambulatory Visit: Payer: Self-pay | Admitting: Cardiovascular Disease

## 2020-08-04 ENCOUNTER — Other Ambulatory Visit: Payer: Self-pay

## 2020-08-04 ENCOUNTER — Ambulatory Visit (INDEPENDENT_AMBULATORY_CARE_PROVIDER_SITE_OTHER): Payer: Medicare Other | Admitting: *Deleted

## 2020-08-04 DIAGNOSIS — Z5181 Encounter for therapeutic drug level monitoring: Secondary | ICD-10-CM

## 2020-08-04 DIAGNOSIS — I480 Paroxysmal atrial fibrillation: Secondary | ICD-10-CM | POA: Diagnosis not present

## 2020-08-04 LAB — POCT INR: INR: 2.8 (ref 2.0–3.0)

## 2020-08-04 NOTE — Patient Instructions (Signed)
Description   Continue taking Warfarin 1 tablet daily except 1.5 tablets on Sundays and Fridays. Recheck INR in 5 weeks. Call us with any medication changes or concern [phone] [facility], Main # [phone].

## 2020-08-12 ENCOUNTER — Other Ambulatory Visit: Payer: Self-pay | Admitting: Physician Assistant

## 2020-08-19 DIAGNOSIS — Z7901 Long term (current) use of anticoagulants: Secondary | ICD-10-CM | POA: Diagnosis not present

## 2020-08-19 DIAGNOSIS — Z79899 Other long term (current) drug therapy: Secondary | ICD-10-CM | POA: Diagnosis not present

## 2020-08-19 DIAGNOSIS — L03115 Cellulitis of right lower limb: Secondary | ICD-10-CM | POA: Diagnosis not present

## 2020-08-31 ENCOUNTER — Encounter: Payer: Self-pay | Admitting: Family Medicine

## 2020-08-31 ENCOUNTER — Ambulatory Visit (INDEPENDENT_AMBULATORY_CARE_PROVIDER_SITE_OTHER): Payer: Medicare Other | Admitting: Family Medicine

## 2020-08-31 ENCOUNTER — Other Ambulatory Visit: Payer: Self-pay

## 2020-08-31 ENCOUNTER — Telehealth: Payer: Self-pay | Admitting: Family Medicine

## 2020-08-31 VITALS — BP 98/54 | HR 72 | Temp 98.1°F | Ht 65.75 in | Wt 171.3 lb

## 2020-08-31 DIAGNOSIS — Z23 Encounter for immunization: Secondary | ICD-10-CM | POA: Diagnosis not present

## 2020-08-31 DIAGNOSIS — E032 Hypothyroidism due to medicaments and other exogenous substances: Secondary | ICD-10-CM | POA: Diagnosis not present

## 2020-08-31 DIAGNOSIS — E782 Mixed hyperlipidemia: Secondary | ICD-10-CM

## 2020-08-31 DIAGNOSIS — I10 Essential (primary) hypertension: Secondary | ICD-10-CM | POA: Diagnosis not present

## 2020-08-31 DIAGNOSIS — C44310 Basal cell carcinoma of skin of unspecified parts of face: Secondary | ICD-10-CM | POA: Diagnosis not present

## 2020-08-31 DIAGNOSIS — B0229 Other postherpetic nervous system involvement: Secondary | ICD-10-CM | POA: Diagnosis not present

## 2020-08-31 DIAGNOSIS — I4891 Unspecified atrial fibrillation: Secondary | ICD-10-CM | POA: Diagnosis not present

## 2020-08-31 DIAGNOSIS — I2510 Atherosclerotic heart disease of native coronary artery without angina pectoris: Secondary | ICD-10-CM

## 2020-08-31 MED ORDER — TRIAMCINOLONE ACETONIDE 0.1 % EX CREA: 1.0000 "application " | TOPICAL_CREAM | Freq: Two times a day (BID) | CUTANEOUS | 1 refills | Status: DC

## 2020-08-31 NOTE — Telephone Encounter (Signed)
Spoke with Ricardo Valencia and informed him of the message below.

## 2020-08-31 NOTE — Progress Notes (Signed)
Established Patient Office Visit  Subjective:  Patient ID: Ricardo Valencia, male    DOB: 11/19/1928  Age: 86 y.o. MRN: [id number]  CC:  Chief Complaint  Patient presents with   Annual Exam    HPI Ricardo Valencia presents for medical follow-up.  He is accompanied by his wife.  He has history of some cognitive impairment and probable dementia, CAD, systolic heart failure, hypertension, atrial fibrillation, hypothyroidism, postherpetic neuralgia, hyperlipidemia, essential tremor  Has not had office follow-up in some time.  He has had some recent itching of both legs and apparently had what sounds like cellulitis and went recently to urgent care and treated for that.  He has significant itching and frequently is scratching both legs.  He has multiple bruises lower extremities from scratching.  His Coumadin is managed through cardiology.  We recommended office follow-up as he is overdue for labs.  Medications are reviewed include Coumadin, primidone, potassium, Pacerone, lisinopril, levothyroxine, gabapentin, furosemide, Lipitor.  Wife states he is compliant with medications.  He has history of skin cancers of the face and neck.  He is seeing [facility] dermatology previously.  He has multiple concerning lesions and is basically been lost to follow-up with them.  Past Medical History:  Diagnosis Date   CAD (coronary artery disease)    DES to mid LAD 0/86/7619   Chronic systolic heart failure ([facility])    Hay fever    hay fever, allergies   History of echocardiogram    Echo 3/17: EF 40%, diff HK, trivial AI, trivial MR, mod LAE, mild reduced RVSF, mod RAE   Hyperlipidemia    Hypertension    Persistent atrial fibrillation ([place])     Past Surgical History:  Procedure Laterality Date   APPENDECTOMY  1947   CARDIOVERSION N/A 12/30/2014   Procedure: CARDIOVERSION;  Surgeon: Jerline Pain, MD;  Location: [place];  Service: Cardiovascular;  Laterality: N/A;   CORONARY  ANGIOPLASTY WITH STENT PLACEMENT     LEFT HEART CATHETERIZATION WITH CORONARY ANGIOGRAM N/A 11/10/2014   Procedure: LEFT HEART CATHETERIZATION WITH CORONARY ANGIOGRAM;  Surgeon: Wellington Hampshire, MD;  Location: [place] CATH LAB;  Service: Cardiovascular;  Laterality: N/A;   PERCUTANEOUS CORONARY STENT INTERVENTION (PCI-S)  11/10/2014   Procedure: PERCUTANEOUS CORONARY STENT INTERVENTION (PCI-S);  Surgeon: Wellington Hampshire, MD;  Location: [facility] CATH LAB;  Service: Cardiovascular;;    Family History  Problem Relation Age of Onset   Cancer Mother        breast   Cancer Father        colon    Social History   Socioeconomic History   Marital status: Married    Spouse name: Not on file   Number of children: Not on file   Years of education: Not on file   Highest education level: Not on file  Occupational History   Not on file  Tobacco Use   Smoking status: Never Smoker   Smokeless tobacco: Never Used   Tobacco comment: "smoked when I was 22"  Vaping Use   Vaping Use: Never used  Substance and Sexual Activity   Alcohol use: Yes    Alcohol/week: 7.0 - 14.0 standard drinks    Types: 7 - 14 Cans of beer per week    Comment: occ   Drug use: No   Sexual activity: Not Currently  Other Topics Concern   Not on file  Social History Narrative   Not on file   Social Determinants of Health  Financial Resource Strain: Not on file  Food Insecurity: Not on file  Transportation Needs: Not on file  Physical Activity: Not on file  Stress: Not on file  Social Connections: Not on file  Intimate Partner Violence: Not on file    Outpatient Medications Prior to Visit  Medication Sig Dispense Refill   Ascorbic Acid (VITAMIN C) 1000 MG tablet Take 1,000 mg by mouth daily.     atorvastatin (LIPITOR) 80 MG tablet TAKE 1 TABLET BY MOUTH ONCE DAILY AT  6PM 90 tablet 3   calcium carbonate (TUMS) 500 MG chewable tablet Chew 1 tablet (200 mg of elemental calcium total) by mouth daily.  90 tablet 3   furosemide (LASIX) 40 MG tablet TAKE 1 & 1/2 (ONE & ONE-HALF) TABLETS BY MOUTH ONCE DAILY 135 tablet 0   gabapentin (NEURONTIN) 300 MG capsule Take 1 capsule (300 mg total) by mouth 3 (three) times daily. 90 capsule 5   levothyroxine (SYNTHROID) 25 MCG tablet TAKE 1 TABLET BY MOUTH ONCE DAILY BEFORE BREAKFAST NEED AN APPT FOR REFILS 30 tablet 3   lisinopril (ZESTRIL) 2.5 MG tablet Take 1 tablet by mouth once daily 90 tablet 1   nitroGLYCERIN (NITROSTAT) 0.4 MG SL tablet Place 1 tablet (0.4 mg total) under the tongue every 5 (five) minutes x 3 doses as needed for chest pain. 25 tablet 2   PACERONE 200 MG tablet TAKE 1 TABLET BY MOUTH ONCE DAILY . APPOINTMENT REQUIRED FOR FUTURE REFILLS 30 tablet 0   potassium chloride (KLOR-CON) 10 MEQ tablet Take 1 tablet (10 mEq total) by mouth daily. 90 tablet 3   primidone (MYSOLINE) 50 MG tablet Take 1 tablet (50 mg total) by mouth 2 (two) times daily. Take at 10am and 6pm 60 tablet 6   warfarin (COUMADIN) 5 MG tablet TAKE AS DIRECTED BY  COUMADIN  CLINIC 40 tablet 0   No facility-administered medications prior to visit.    No Known Allergies  ROS Review of Systems  Constitutional: Negative for fatigue and unexpected weight change.  Eyes: Negative for visual disturbance.  Respiratory: Negative for cough and chest tightness.   Cardiovascular: Negative for chest pain and palpitations.  Endocrine: Negative for polydipsia and polyuria.  Genitourinary: Negative for dysuria.  Neurological: Negative for dizziness, syncope, weakness, light-headedness and headaches.      Objective:    Physical Exam Vitals reviewed.  Cardiovascular:     Rate and Rhythm: Normal rate.  Pulmonary:     Effort: Pulmonary effort is normal.     Breath sounds: Normal breath sounds.  Musculoskeletal:     Comments: Trace edema legs bilaterally.  He has multiple excoriations on both lower extremities but no signs of cellulitis.  He has multiple areas of  bruising on the lower legs somewhat linear and likely related to recent scratching.  He has a couple of superficial abrasions right leg but no signs of secondary infection at this time  Skin:    Comments: He has very dry skin involving both legs with multiple excoriations on both legs and areas of some linear ecchymosis related to scratching.  He has several concerning nodular lesions including large 1 left forehead and a couple left-sided neck.  One is at the site of previous excisional surgery site for skin cancer  Neurological:     Mental Status: He is alert.     BP (!) 98/54 (BP Location: Left Arm, Patient Position: Sitting, Cuff Size: Normal)    Pulse 72    Temp 98.1  F (36.7 C) (Oral)    Ht 5' 5.75" (1.67 m)    Wt 171 lb 4.8 oz (77.7 kg)    BMI 27.86 kg/m  Wt Readings from Last 3 Encounters:  08/31/20 171 lb 4.8 oz (77.7 kg)  03/01/20 172 lb (78 kg)  11/24/19 172 lb (78 kg)     Health Maintenance Due  Topic Date Due   COVID-19 Vaccine (3 - Pfizer risk 4-dose series) 08/04/2020    There are no preventive care reminders to display for this patient.  Lab Results  Component Value Date   TSH 9.230 (H) 02/25/2019   Lab Results  Component Value Date   WBC 6.3 11/02/2019   HGB 12.7 (L) 11/02/2019   HCT 38.6 11/02/2019   MCV 94 11/02/2019   PLT 164 11/02/2019   Lab Results  Component Value Date   NA 139 10/05/2019   K 4.1 10/05/2019   CO2 27 10/05/2019   GLUCOSE 106 (H) 10/05/2019   BUN 17 10/05/2019   CREATININE 1.94 (H) 10/05/2019   BILITOT 1.3 (H) 12/08/2018   ALKPHOS 80 12/08/2018   AST 68 (H) 12/08/2018   ALT 109 (H) 12/08/2018   PROT 6.5 12/08/2018   ALBUMIN 3.1 (L) 12/08/2018   CALCIUM 7.7 (L) 10/05/2019   ANIONGAP 12 12/08/2018   GFR 66.35 10/20/2015   Lab Results  Component Value Date   CHOL 178 10/01/2017   Lab Results  Component Value Date   HDL 63 10/01/2017   Lab Results  Component Value Date   LDLCALC 93 10/01/2017   Lab Results   Component Value Date   TRIG 108 10/01/2017   Lab Results  Component Value Date   CHOLHDL 2.8 10/01/2017   Lab Results  Component Value Date   HGBA1C 5.4 11/07/2014      Assessment & Plan:   #1 hyperlipidemia.  History of CAD.  Goal LDL less than 70.  -Recheck lipid and hepatic panel.  Continue Lipitor.   #2 hypertension-stable.  Denies any dizziness at this time. -Continue current medications including lisinopril and Lasix. -Check basic metabolic panel  #3 dry skin dermatitis involving legs.  Multiple excoriations. -Avoid scratching as much as possible -Prescribed triamcinolone 0.1% cream to use twice daily as needed and combined with moisturizer and use liberally  # 4 hypothyroidism- -recheck TSH  #5 history of multiple skin cancers.  He has multiple concerning lesions including left forehead and left side of neck consistent with basal cell and possibly squamous cell carcinoma -Refer back to Dr. Ronnald Ramp with [facility] dermatology  #6 history of atrial fibrillation.  Patient on chronic Coumadin. -Continue close follow-up with Coumadin clinic.  Meds ordered this encounter  Medications   triamcinolone (KENALOG) 0.1 %    Sig: Apply 1 application topically 2 (two) times daily.    Dispense:  453.6 g    Refill:  1    Follow-up: Return in about 6 months (around 03/01/2021).    Carolann Littler, MD

## 2020-08-31 NOTE — Telephone Encounter (Signed)
Joe the pharmacist from [facility] is calling in to get clarification on Rx triamcinolone (KENALOG) they are wanting to know if it is a cream or ointment due to the pt is at the pharmacy.

## 2020-08-31 NOTE — Telephone Encounter (Signed)
cream

## 2020-09-01 LAB — LIPID PANEL
Cholesterol: 180 mg/dL (ref <200)
HDL: 54 mg/dL (ref >=40)
LDL Cholesterol (Calc): 107 mg/dL (calc) — ABNORMAL HIGH
Non-HDL Cholesterol (Calc): 126 mg/dL (calc) (ref <130)
Total CHOL/HDL Ratio: 3.3 (calc) (ref <5.0)
Triglycerides: 97 mg/dL (ref <150)

## 2020-09-01 LAB — BASIC METABOLIC PANEL
BUN/Creatinine Ratio: 9 (calc) (ref 6–22)
BUN: 19 mg/dL (ref 7–25)
CO2: 26 mmol/L (ref 20–32)
Calcium: 8.1 mg/dL — ABNORMAL LOW (ref 8.6–10.3)
Chloride: 105 mmol/L (ref 98–110)
Creat: 2.02 mg/dL — ABNORMAL HIGH (ref 0.70–1.11)
Glucose, Bld: 85 mg/dL (ref 65–99)
Potassium: 4.6 mmol/L (ref 3.5–5.3)
Sodium: 142 mmol/L (ref 135–146)

## 2020-09-01 LAB — CBC WITH DIFFERENTIAL/PLATELET
Absolute Monocytes: 433 cells/uL (ref 200–950)
Basophils Absolute: 51 cells/uL (ref 0–200)
Basophils Relative: 0.9 %
Eosinophils Absolute: 274 cells/uL (ref 15–500)
Eosinophils Relative: 4.8 %
HCT: 39.9 % (ref 38.5–50.0)
Hemoglobin: 13.2 g/dL (ref 13.2–17.1)
Lymphs Abs: 1271 cells/uL (ref 850–3900)
MCH: 29.7 pg (ref 27.0–33.0)
MCHC: 33.1 g/dL (ref 32.0–36.0)
MCV: 89.9 fL (ref 80.0–100.0)
MPV: 9.7 fL (ref 7.5–12.5)
Monocytes Relative: 7.6 %
Neutro Abs: 3671 cells/uL (ref 1500–7800)
Neutrophils Relative %: 64.4 %
Platelets: 142 10*3/uL (ref 140–400)
RBC: 4.44 10*6/uL (ref 4.20–5.80)
RDW: 14.3 % (ref 11.0–15.0)
Total Lymphocyte: 22.3 %
WBC: 5.7 10*3/uL (ref 3.8–10.8)

## 2020-09-01 LAB — HEPATIC FUNCTION PANEL
AG Ratio: 1.3 (calc) (ref 1.0–2.5)
ALT: 12 U/L (ref 9–46)
AST: 18 U/L (ref 10–35)
Albumin: 3.4 g/dL — ABNORMAL LOW (ref 3.6–5.1)
Alkaline phosphatase (APISO): 68 U/L (ref 35–144)
Bilirubin, Direct: 0.2 mg/dL (ref 0.0–0.2)
Globulin: 2.7 g/dL (calc) (ref 1.9–3.7)
Indirect Bilirubin: 0.7 mg/dL (calc) (ref 0.2–1.2)
Total Bilirubin: 0.9 mg/dL (ref 0.2–1.2)
Total Protein: 6.1 g/dL (ref 6.1–8.1)

## 2020-09-01 LAB — TSH: TSH: 9.4 mIU/L — ABNORMAL HIGH (ref 0.40–4.50)

## 2020-09-05 ENCOUNTER — Telehealth: Payer: Self-pay

## 2020-09-05 NOTE — Telephone Encounter (Signed)
Left message for patient to call the office regarding results and new medication recommendations.

## 2020-09-05 NOTE — Telephone Encounter (Signed)
-----   Message from Eulas Post, MD sent at 09/01/2020 10:33 AM EST ----- Thyroid is under-replaced.  Increase Levothyroxine to 50 mcg daily and make sure taking first thing in morning on empty stomach and with no other meds     needs 3 month follow up and will repeat TSH then.

## 2020-09-07 ENCOUNTER — Telehealth: Payer: Self-pay | Admitting: Family Medicine

## 2020-09-07 NOTE — Telephone Encounter (Signed)
Pt is calling in to see if someone can call him back with his lab results

## 2020-09-12 ENCOUNTER — Other Ambulatory Visit: Payer: Self-pay | Admitting: Cardiovascular Disease

## 2020-09-12 DIAGNOSIS — I480 Paroxysmal atrial fibrillation: Secondary | ICD-10-CM

## 2020-09-12 NOTE — Telephone Encounter (Signed)
Spoke with patient's wife.  Patient does not feel well and she is worried he may have Covid.  Sent in refill and wife will call back to schedule when he feels better

## 2020-09-12 NOTE — Telephone Encounter (Signed)
See result note.  

## 2020-09-28 ENCOUNTER — Other Ambulatory Visit: Payer: Self-pay | Admitting: Family Medicine

## 2020-09-28 ENCOUNTER — Other Ambulatory Visit: Payer: Self-pay | Admitting: Cardiovascular Disease

## 2020-09-28 ENCOUNTER — Other Ambulatory Visit (HOSPITAL_COMMUNITY): Payer: Self-pay | Admitting: Cardiology

## 2020-09-30 ENCOUNTER — Other Ambulatory Visit: Payer: Self-pay

## 2020-09-30 ENCOUNTER — Ambulatory Visit (INDEPENDENT_AMBULATORY_CARE_PROVIDER_SITE_OTHER): Payer: Medicare Other

## 2020-09-30 DIAGNOSIS — Z5181 Encounter for therapeutic drug level monitoring: Secondary | ICD-10-CM

## 2020-09-30 DIAGNOSIS — Z7901 Long term (current) use of anticoagulants: Secondary | ICD-10-CM | POA: Diagnosis not present

## 2020-09-30 DIAGNOSIS — I480 Paroxysmal atrial fibrillation: Secondary | ICD-10-CM

## 2020-09-30 LAB — POCT INR: INR: 2.4 (ref 2.0–3.0)

## 2020-09-30 NOTE — Patient Instructions (Signed)
-   Continue taking Warfarin 1 tablet daily except 1.5 tablets on Sundays and Fridays.  -Recheck INR in 6 weeks.  Call us with any medication changes or concern [phone] [facility], Main # [phone].

## 2020-10-11 ENCOUNTER — Telehealth (HOSPITAL_COMMUNITY): Payer: Self-pay | Admitting: Vascular Surgery

## 2020-10-11 MED ORDER — AMIODARONE HCL 200 MG PO TABS: ORAL_TABLET | ORAL | 0 refills | Status: DC

## 2020-10-11 NOTE — Telephone Encounter (Signed)
Refill Pacerone 200 mg , pt is scheduled 2/15 w/ [facility]

## 2020-10-20 ENCOUNTER — Other Ambulatory Visit: Payer: Self-pay | Admitting: Cardiology

## 2020-10-20 DIAGNOSIS — I480 Paroxysmal atrial fibrillation: Secondary | ICD-10-CM

## 2020-11-01 ENCOUNTER — Encounter (HOSPITAL_COMMUNITY): Payer: Medicare Other | Admitting: Cardiology

## 2020-11-02 ENCOUNTER — Ambulatory Visit (HOSPITAL_COMMUNITY)
Admission: RE | Admit: 2020-11-02 | Discharge: 2020-11-02 | Disposition: A | Discharge status: Home / Self Care | Payer: Medicare Other | Source: Ambulatory Visit | Attending: Family Medicine | Admitting: Family Medicine

## 2020-11-02 ENCOUNTER — Other Ambulatory Visit: Payer: Self-pay

## 2020-11-02 ENCOUNTER — Encounter (HOSPITAL_COMMUNITY): Payer: Self-pay

## 2020-11-02 VITALS — BP 112/70 | HR 71 | Wt 170.0 lb

## 2020-11-02 DIAGNOSIS — N183 Chronic kidney disease, stage 3 unspecified: Secondary | ICD-10-CM | POA: Diagnosis not present

## 2020-11-02 DIAGNOSIS — I2510 Atherosclerotic heart disease of native coronary artery without angina pectoris: Secondary | ICD-10-CM | POA: Diagnosis not present

## 2020-11-02 DIAGNOSIS — Z955 Presence of coronary angioplasty implant and graft: Secondary | ICD-10-CM | POA: Insufficient documentation

## 2020-11-02 DIAGNOSIS — I4819 Other persistent atrial fibrillation: Secondary | ICD-10-CM | POA: Insufficient documentation

## 2020-11-02 DIAGNOSIS — R0683 Snoring: Secondary | ICD-10-CM | POA: Diagnosis not present

## 2020-11-02 DIAGNOSIS — I4892 Unspecified atrial flutter: Secondary | ICD-10-CM | POA: Diagnosis not present

## 2020-11-02 DIAGNOSIS — Z79899 Other long term (current) drug therapy: Secondary | ICD-10-CM | POA: Insufficient documentation

## 2020-11-02 DIAGNOSIS — I5022 Chronic systolic (congestive) heart failure: Secondary | ICD-10-CM | POA: Diagnosis not present

## 2020-11-02 DIAGNOSIS — I480 Paroxysmal atrial fibrillation: Secondary | ICD-10-CM

## 2020-11-02 DIAGNOSIS — I130 Hypertensive heart and chronic kidney disease with heart failure and stage 1 through stage 4 chronic kidney disease, or unspecified chronic kidney disease: Secondary | ICD-10-CM | POA: Insufficient documentation

## 2020-11-02 DIAGNOSIS — I5032 Chronic diastolic (congestive) heart failure: Secondary | ICD-10-CM

## 2020-11-02 DIAGNOSIS — Z7901 Long term (current) use of anticoagulants: Secondary | ICD-10-CM | POA: Diagnosis not present

## 2020-11-02 DIAGNOSIS — R0602 Shortness of breath: Secondary | ICD-10-CM | POA: Insufficient documentation

## 2020-11-02 LAB — COMPREHENSIVE METABOLIC PANEL WITH GFR
ALT: 12 U/L (ref 0–44)
AST: 24 U/L (ref 15–41)
Albumin: 3.5 g/dL (ref 3.5–5.0)
Alkaline Phosphatase: 65 U/L (ref 38–126)
Anion gap: 12 (ref 5–15)
BUN: 20 mg/dL (ref 8–23)
CO2: 25 mmol/L (ref 22–32)
Calcium: 8.3 mg/dL — ABNORMAL LOW (ref 8.9–10.3)
Chloride: 102 mmol/L (ref 98–111)
Creatinine, Ser: 2.09 mg/dL — ABNORMAL HIGH (ref 0.61–1.24)
GFR, Estimated: 30 mL/min — ABNORMAL LOW
Glucose, Bld: 89 mg/dL (ref 70–99)
Potassium: 4.4 mmol/L (ref 3.5–5.1)
Sodium: 139 mmol/L (ref 135–145)
Total Bilirubin: 1 mg/dL (ref 0.3–1.2)
Total Protein: 6.7 g/dL (ref 6.5–8.1)

## 2020-11-02 LAB — PROTIME-INR
INR: 2 — ABNORMAL HIGH (ref 0.8–1.2)
Prothrombin Time: 21.9 s — ABNORMAL HIGH (ref 11.4–15.2)

## 2020-11-02 LAB — TSH: TSH: 8.125 u[IU]/mL — ABNORMAL HIGH (ref 0.350–4.500)

## 2020-11-02 MED ORDER — AMIODARONE HCL 200 MG PO TABS: 200.0000 mg | ORAL_TABLET | Freq: Two times a day (BID) | ORAL | 6 refills | Status: DC

## 2020-11-02 NOTE — Patient Instructions (Addendum)
INCREASE Amiodarone to 200 mg twice a day  Labs today We will only contact you if something comes back abnormal or we need to make some changes. Otherwise no news is good news!  Your provider has recommended that you have a home sleep study.  We have provided you with the equipment in our office today. Please download the app and follow the instructions. YOUR PIN NUMBER IS: [id number]. Once you have completed the test you just dispose of the equipment, the information is automatically uploaded to [place] via blue-tooth technology. If your test is positive for sleep apnea and you need a home CPAP machine you will be contacted by Dr Theodosia Blender office [facility]) to set this up.    Your physician recommends that you schedule a follow-up appointment in: 2 weeks  in the Advanced Practitioners (PA/NP) [facility], you and your health needs are our priority. As part of our continuing mission to provide you with exceptional heart care, we have created designated Provider Care Teams. These Care Teams include your primary Cardiologist (physician) and Advanced Practice Providers (APPs- Physician Assistants and Nurse Practitioners) who all work together to provide you with the care you need, when you need it.   You may see any of the following providers on your designated Care Team at your next follow up: Marland Kitchen Dr Glori Bickers . Dr Loralie Champagne . Darrick Grinder, NP . Lyda Jester, [place], NP . Audry Riles, PharmD   Please be sure to bring in all your medications bottles to every appointment.

## 2020-11-02 NOTE — Progress Notes (Signed)
PCP: Dr. [name] Cardiology: Dr. Angelena Form HF Cardiology: Dr. Aundra Dubin  87 y.o. with history of persistent atrial fibrillation, CAD, and chronic systolic CHF returns for followup of CHF and atrial fibrillation.  Patient was admitted in 2/16 with afib/RVR.  Echo showed EF 25-30% at that time.  He had LHC, showing 90% mLAD stenosis that was treated with DES. In 4/16, he was cardioverted back to NSR.  Repeat echo in NSR showed EF 45-50%.  He was then admitted in 1/17 and again in 3/17 with afib/RVR (recurrent).  He did not have repeat DCCV. Echo in 3/17 showed EF 40% with mildly decreased RV systolic function.  He has remained in atrial fibrillation.   Last echo in 10/18 showed EF 55-60% with basal inferior akinesis, mildly dilated RV with mildly decreased RV systolic function.    He was admitted for Tikosyn initiation and converted to NSR, but Tikosyn had to be stopped because of prolonged QT interval. Amiodarone was started after; he is still on amiodarone today.   Patient returned for followup of CHF and atrial fibrillation 11/2017.  He was in sinus bradycardia. oprol was decreased to 50 mg due to SB.  Started having falls 6/21 and sinus bradycardia. Toprol was stopped and amiodarone decreased to 100 mg daily.  Today he returns for HF follow up with his wife. He has not been in AHF clinic since 11/2017. Overall feeling fine. Able to get around house with cane. SOB with minimal activity, however this is his baseline. Denies increasing SOB, CP, dizziness, edema, or PND/Orthopnea. Appetite ok. No fever or chills. Weights stable at home. Taking all medications.   ECG (personally reviewed): aflutter 72 bpm  Labs (5/17): LDL 64, HDL 66 Labs (7/18): K 4.3, creatinine 1.42 Labs (9/18): K 4.5, creatinine 1.46, BNP 501  Labs (11/18): K 4, creatinine 1.43, tbili 1.4, AST/ALT normal, TSH elevated but free T3 and free T4 normal.  Labs (1/19): LDL 93, HDL 63, hgb 13.5, TSH 9, free T3 and T4 normal, LFTs normal,  K 4.2, creatinine 1.57 Labs (12/21): K 4.6, creatinine 2.02, LDL 107, HDL 54, hgb 13.2, TSH 9.4 (Synthroid increased by PCP)  PMH: 1. HTN 2. CKD stage 3 3. Hyperlipidemia 4. CAD:  - LHC 2/16 with 90% mLAD, 50% RCA => DES to mLAD.  5. 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  - Echo (2/16) with EF 25-30%, moderate MR while in afib.  - Echo (4/16) in NSR with EF 45-50%, no MR.  - Echo (3/17) in atrial fibrillation with EF 40%, mildly decreased RV systolic function.  - Echo (10/18): EF 55-60%, basal inferior akinesis, mild LVH, mildly dilated RV with mildly decreased systolic function, mild MR. 6. Atrial fibrillation: Persistent.  - 2/16 admission for afib/RVR => DCCV in 4/16.  - 1/17 admission with afib/RVR - 3/17 admission with afib/RVR 7. PFTs (11/18): moderate obstructive airways disease.   Social History   Socioeconomic History  . Marital status: Married    Spouse name: Not on file  . Number of children: Not on file  . Years of education: Not on file  . Highest education level: Not on file  Occupational History  . Not on file  Tobacco Use  . Smoking status: Never Smoker  . Smokeless tobacco: Never Used  . Tobacco comment: "smoked when I was 22"  Vaping Use  . Vaping Use: Never used  Substance and Sexual Activity  . Alcohol use: Yes    Alcohol/week: 7.0 - 14.0 standard  drinks    Types: 7 - 14 Cans of beer per week    Comment: occ  . Drug use: No  . Sexual activity: Not Currently  Other Topics Concern  . Not on file  Social History Narrative  . Not on file   Social Determinants of Health   Financial Resource Strain: Not on file  Food Insecurity: Not on file  Transportation Needs: Not on file  Physical Activity: Not on file  Stress: Not on file  Social Connections: Not on file  Intimate Partner Violence: Not on file   Family History  Problem Relation Age of Onset  . Cancer Mother        breast  . Cancer Father         colon   ROS: All systems reviewed and negative except as per HPI.   Current Outpatient Medications  Medication Sig Dispense Refill  . Ascorbic Acid (VITAMIN C) 1000 MG tablet Take 1,000 mg by mouth daily.    Marland Kitchen atorvastatin (LIPITOR) 80 MG tablet TAKE 1 TABLET BY MOUTH ONCE DAILY AT  6PM 90 tablet 3  . calcium carbonate (TUMS - DOSED IN MG ELEMENTAL CALCIUM) 500 MG chewable tablet Chew 1 tablet by mouth as needed for indigestion or heartburn.    . furosemide (LASIX) 40 MG tablet TAKE 1 & 1/2 (ONE & ONE-HALF) TABLETS BY MOUTH ONCE DAILY 135 tablet 1  . gabapentin (NEURONTIN) 300 MG capsule Take 1 capsule (300 mg total) by mouth 3 (three) times daily. 90 capsule 5  . levothyroxine (SYNTHROID) 25 MCG tablet TAKE 1 TABLET BY MOUTH ONCE DAILY BEFORE BREAKFAST . APPOINTMENT REQUIRED FOR FUTURE REFILLS 90 tablet 0  . lisinopril (ZESTRIL) 2.5 MG tablet Take 1 tablet by mouth once daily 90 tablet 1  . nitroGLYCERIN (NITROSTAT) 0.4 MG SL tablet Place 1 tablet (0.4 mg total) under the tongue every 5 (five) minutes x 3 doses as needed for chest pain. 25 tablet 2  . potassium chloride (KLOR-CON) 10 MEQ tablet Take 1 tablet (10 mEq total) by mouth daily. 90 tablet 3  . primidone (MYSOLINE) 50 MG tablet Take 1 tablet (50 mg total) by mouth 2 (two) times daily. Take at 10am and 6pm 60 tablet 6  . triamcinolone (KENALOG) 0.1 % Apply 1 application topically 2 (two) times daily. 453.6 g 1  . warfarin (COUMADIN) 5 MG tablet TAKE AS DIRECTED BY  COUMADIN  CLINIC 40 tablet 0  . amiodarone (PACERONE) 200 MG tablet Take 1 tablet (200 mg total) by mouth 2 (two) times daily. 60 tablet 6   No current facility-administered medications for this encounter.   Wt Readings from Last 3 Encounters:  11/02/20 77.1 kg (170 lb)  08/31/20 77.7 kg (171 lb 4.8 oz)  03/01/20 78 kg (172 lb)   BP 112/70   Pulse 71   Wt 77.1 kg (170 lb)   SpO2 99%   BMI 27.65 kg/m  General:  NAD. No resp difficulty, elderly  well-appearing HEENT: Normal Neck: Supple. No JVD. Carotids 2+ bilat; no bruits. No lymphadenopathy or thryomegaly appreciated. Cor: PMI nondisplaced. Irregular rate & rhythm. No rubs, gallops, I/VI HSM LLSB Lungs: Clear, diminished in bases Abdomen: Soft, nontender, nondistended. No hepatosplenomegaly. No bruits or masses. Good bowel sounds. Extremities: No cyanosis, clubbing, rash, edema Neuro: alert & oriented x 3, cranial nerves grossly intact. Moves all 4 extremities w/o difficulty. Affect pleasant.  Assessment/Plan: 1. 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  EF has fallen when  he has been in atrial fibrillation. However, echo in 3/20 showed EF up to 55-60%.  NYHA class III symptoms, likely due to general inactivity and deconditioning.  He is not volume overloaded on exam.  - His Toprol was stopped 6/21 due to SB and frequent falls w/ rib fractures. - Continue lisinopril 2.5 mg daily.  2. Atrial fibrillation: Paroxysmal.  He was unable to continue Tikosyn due to prolonged QT interval.  He is now on low-dose amiodarone, however he is now in atrial flutter. Last ECG 10/2018 showed SB with 1st degree AVB. He is on Coumadin and INRs have been therapeutic. He has not felt bad out of rhythm thus far. - He is not symptomatic. Will increase amiodarone to 200 mg bid. - Check CMET, TSH, INR today.  He will need a regular eye exam.  - Continue warfarin.   3. CKD: Stage 3.  BMET today.  4. CAD: DES to LAD in 2/16.  No chest pain.  - No ASA given stable CAD and warfarin use.  - Continue atorvastatin 80 mg daily. 5. Suspect OSA: Snoring. Agreeable to sleep study. Will order today.  Follow back in APP clinic in 2 weeks with repeat EKG. If still in aflutter, will set up for DCCV with Dr. Aundra Dubin.  Maricela Bo [facility] FNP-BC 11/02/2020

## 2020-11-03 ENCOUNTER — Telehealth (HOSPITAL_COMMUNITY): Payer: Self-pay | Admitting: Cardiology

## 2020-11-03 ENCOUNTER — Ambulatory Visit (INDEPENDENT_AMBULATORY_CARE_PROVIDER_SITE_OTHER): Payer: Medicare Other | Admitting: Pharmacist

## 2020-11-03 ENCOUNTER — Telehealth (HOSPITAL_COMMUNITY): Payer: Self-pay | Admitting: *Deleted

## 2020-11-03 DIAGNOSIS — Z7901 Long term (current) use of anticoagulants: Secondary | ICD-10-CM

## 2020-11-03 DIAGNOSIS — I5032 Chronic diastolic (congestive) heart failure: Secondary | ICD-10-CM

## 2020-11-03 NOTE — Telephone Encounter (Signed)
itamar sleep study  No pre cert reqd  Routed to [facility]

## 2020-11-03 NOTE — Telephone Encounter (Signed)
Pt aware and voiced understanding Repeat labs 2/24

## 2020-11-03 NOTE — Telephone Encounter (Signed)
-----   Message from Rafael Bihari, [place] sent at 11/02/2020  5:42 PM EST ----- Kidney function slightly elevated. Hold lasix for 3 days, then resume. Labs in 7-10 days

## 2020-11-04 ENCOUNTER — Other Ambulatory Visit (HOSPITAL_COMMUNITY): Payer: Self-pay | Admitting: Cardiology

## 2020-11-10 ENCOUNTER — Telehealth (HOSPITAL_COMMUNITY): Payer: Self-pay | Admitting: Surgery

## 2020-11-10 NOTE — Telephone Encounter (Signed)
I called and spoke with patients wife regarding home sleep study.  I let her know that insurance prior authorization was not required and that he could proceed in the near future to complete the test.  She is aware and agreeable.

## 2020-11-11 ENCOUNTER — Other Ambulatory Visit (HOSPITAL_COMMUNITY): Payer: Medicare Other

## 2020-11-15 ENCOUNTER — Encounter (INDEPENDENT_AMBULATORY_CARE_PROVIDER_SITE_OTHER): Payer: Medicare Other | Admitting: Cardiology

## 2020-11-15 DIAGNOSIS — I480 Paroxysmal atrial fibrillation: Secondary | ICD-10-CM

## 2020-11-15 DIAGNOSIS — R0683 Snoring: Secondary | ICD-10-CM | POA: Diagnosis not present

## 2020-11-17 ENCOUNTER — Other Ambulatory Visit: Payer: Self-pay

## 2020-11-17 ENCOUNTER — Ambulatory Visit (HOSPITAL_COMMUNITY)
Admission: RE | Admit: 2020-11-17 | Discharge: 2020-11-17 | Disposition: A | Discharge status: Home / Self Care | Payer: Medicare Other | Source: Ambulatory Visit | Attending: Internal Medicine | Admitting: Internal Medicine

## 2020-11-17 DIAGNOSIS — I5032 Chronic diastolic (congestive) heart failure: Secondary | ICD-10-CM | POA: Insufficient documentation

## 2020-11-17 LAB — BASIC METABOLIC PANEL
Anion gap: 9 (ref 5–15)
BUN: 20 mg/dL (ref 8–23)
CO2: 24 mmol/L (ref 22–32)
Calcium: 8 mg/dL — ABNORMAL LOW (ref 8.9–10.3)
Chloride: 103 mmol/L (ref 98–111)
Creatinine, Ser: 2.25 mg/dL — ABNORMAL HIGH (ref 0.61–1.24)
GFR, Estimated: 27 mL/min — ABNORMAL LOW (ref >60)
Glucose, Bld: 108 mg/dL — ABNORMAL HIGH (ref 70–99)
Potassium: 4.5 mmol/L (ref 3.5–5.1)
Sodium: 136 mmol/L (ref 135–145)

## 2020-11-17 NOTE — Progress Notes (Signed)
PCP: Dr. [name] Cardiology: Dr. Angelena Form HF Cardiology: Dr. Aundra Dubin  87 y.o. with history of persistent atrial fibrillation, CAD, and chronic systolic CHF returns for followup of CHF and atrial fibrillation.  Patient was admitted in 2/16 with afib/RVR.  Echo showed EF 25-30% at that time.  He had LHC, showing 90% mLAD stenosis that was treated with DES. In 4/16, he was cardioverted back to NSR.  Repeat echo in NSR showed EF 45-50%.  He was then admitted in 1/17 and again in 3/17 with afib/RVR (recurrent).  He did not have repeat DCCV. Echo in 3/17 showed EF 40% with mildly decreased RV systolic function.  He has remained in atrial fibrillation.   Last echo in 10/18 showed EF 55-60% with basal inferior akinesis, mildly dilated RV with mildly decreased RV systolic function.    He was admitted for Tikosyn initiation and converted to NSR, but Tikosyn had to be stopped because of prolonged QT interval. Amiodarone was started after; he is still on amiodarone today.   Patient returned for followup of CHF and atrial fibrillation 11/2017.  He was in sinus bradycardia. oprol was decreased to 50 mg due to SB.  Started having falls 6/21 and sinus bradycardia. Toprol was stopped and amiodarone decreased to 100 mg daily.  Today he returns for HF follow up with his wife. Last visit 2 weeks ago he was in atrial flutter, asymptomatic. Amio increased to 200 mg bid, with hopes of chemically converting. Now, overall feeling fine. Denies increasing SOB, CP, dizziness, edema, or PND/Orthopnea. Appetite ok. No fever or chills. Weight at home 173 pounds. Taking all medications.    ECG (personally reviewed): aflutter 63 bpm  Labs (5/17): LDL 64, HDL 66 Labs (7/18): K 4.3, creatinine 1.42 Labs (9/18): K 4.5, creatinine 1.46, BNP 501  Labs (11/18): K 4, creatinine 1.43, tbili 1.4, AST/ALT normal, TSH elevated but free T3 and free T4 normal.  Labs (1/19): LDL 93, HDL 63, hgb 13.5, TSH 9, free T3 and T4 normal, LFTs  normal, K 4.2, creatinine 1.57 Labs (12/21): K 4.6, creatinine 2.02, LDL 107, HDL 54, hgb 13.2, TSH 9.4 (Synthroid increased by PCP Labs (2/22): K 4.5, creatinine 2.25  PMH: 1. HTN 2. CKD stage 3 3. Hyperlipidemia 4. CAD:  - LHC 2/16 with 90% mLAD, 50% RCA => DES to mLAD.  5. 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  - Echo (2/16) with EF 25-30%, moderate MR while in afib.  - Echo (4/16) in NSR with EF 45-50%, no MR.  - Echo (3/17) in atrial fibrillation with EF 40%, mildly decreased RV systolic function.  - Echo (10/18): EF 55-60%, basal inferior akinesis, mild LVH, mildly dilated RV with mildly decreased systolic function, mild MR. 6. Atrial fibrillation: Persistent.  - 2/16 admission for afib/RVR => DCCV in 4/16.  - 1/17 admission with afib/RVR - 3/17 admission with afib/RVR 7. PFTs (11/18): moderate obstructive airways disease.   Social History   Socioeconomic History  . Marital status: Married    Spouse name: Not on file  . Number of children: Not on file  . Years of education: Not on file  . Highest education level: Not on file  Occupational History  . Not on file  Tobacco Use  . Smoking status: Never Smoker  . Smokeless tobacco: Never Used  . Tobacco comment: "smoked when I was 22"  Vaping Use  . Vaping Use: Never used  Substance and Sexual Activity  . Alcohol use: Yes  Alcohol/week: 7.0 - 14.0 standard drinks    Types: 7 - 14 Cans of beer per week    Comment: occ  . Drug use: No  . Sexual activity: Not Currently  Other Topics Concern  . Not on file  Social History Narrative  . Not on file   Social Determinants of Health   Financial Resource Strain: Not on file  Food Insecurity: Not on file  Transportation Needs: Not on file  Physical Activity: Not on file  Stress: Not on file  Social Connections: Not on file  Intimate Partner Violence: Not on file   Family History  Problem Relation Age of Onset  . Cancer  Mother        breast  . Cancer Father        colon   ROS: All systems reviewed and negative except as per HPI.   Current Outpatient Medications  Medication Sig Dispense Refill  . amiodarone (PACERONE) 200 MG tablet Take 200 mg by mouth 2 (two) times daily.    . Ascorbic Acid (VITAMIN C) 1000 MG tablet Take 1,000 mg by mouth daily.    Marland Kitchen atorvastatin (LIPITOR) 80 MG tablet TAKE 1 TABLET BY MOUTH ONCE DAILY AT  6PM 90 tablet 3  . calcium carbonate (TUMS - DOSED IN MG ELEMENTAL CALCIUM) 500 MG chewable tablet Chew 1 tablet by mouth as needed for indigestion or heartburn.    . furosemide (LASIX) 40 MG tablet TAKE 1 & 1/2 (ONE & ONE-HALF) TABLETS BY MOUTH ONCE DAILY 135 tablet 1  . gabapentin (NEURONTIN) 300 MG capsule Take 1 capsule (300 mg total) by mouth 3 (three) times daily. 90 capsule 5  . levothyroxine (SYNTHROID) 25 MCG tablet TAKE 1 TABLET BY MOUTH ONCE DAILY BEFORE BREAKFAST . APPOINTMENT REQUIRED FOR FUTURE REFILLS 90 tablet 0  . lisinopril (ZESTRIL) 2.5 MG tablet Take 1 tablet by mouth once daily 90 tablet 1  . nitroGLYCERIN (NITROSTAT) 0.4 MG SL tablet Place 1 tablet (0.4 mg total) under the tongue every 5 (five) minutes x 3 doses as needed for chest pain. 25 tablet 2  . potassium chloride (KLOR-CON) 10 MEQ tablet Take 1 tablet (10 mEq total) by mouth daily. 90 tablet 3  . primidone (MYSOLINE) 50 MG tablet Take 1 tablet (50 mg total) by mouth 2 (two) times daily. Take at 10am and 6pm 60 tablet 6  . triamcinolone (KENALOG) 0.1 % Apply 1 application topically 2 (two) times daily. 453.6 g 1  . warfarin (COUMADIN) 5 MG tablet TAKE AS DIRECTED BY  COUMADIN  CLINIC 40 tablet 0   No current facility-administered medications for this encounter.   Wt Readings from Last 3 Encounters:  11/18/20 80.9 kg (178 lb 6.4 oz)  11/02/20 77.1 kg (170 lb)  08/31/20 77.7 kg (171 lb 4.8 oz)   BP 119/72   Pulse 68   Wt 80.9 kg (178 lb 6.4 oz)   SpO2 98%   BMI 29.01 kg/m  General:  NAD. No resp  difficulty, elderly HEENT: Normal Neck: Supple. JVP ~6-7. Carotids 2+ bilat; no bruits. No lymphadenopathy or thryomegaly appreciated. Cor: PMI nondisplaced. Irregular rate & rhythm. No rubs, gallops or murmurs. Lungs: Clear Abdomen: Soft, nontender, nondistended. No hepatosplenomegaly. No bruits or masses. Good bowel sounds. Extremities: No cyanosis, clubbing, rash, 1+ LE edema, R>L Neuro: Alert & oriented x 3, cranial nerves grossly intact. Moves all 4 extremities w/o difficulty. Affect pleasant.  Assessment/Plan: 1. 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  EF  has fallen when he has been in atrial fibrillation. However, echo in 3/20 showed EF up to 55-60%.  NYHA III symptoms, likely due to general inactivity and deconditioning.  His volume is up on exam, weight up 8 lbs, Reds 40%. - His Toprol was stopped 6/21 due to SB and frequent falls w/ rib fractures. - Increase lasix to 80 mg daily x 3 days, then back to 60 mg daily. Repeat BMET next week. - Continue lisinopril 2.5 mg daily.  2. Atrial fibrillation: Paroxysmal.  He was unable to continue Tikosyn due to prolonged QT interval.  He is now on low-dose amiodarone, however he is still in atrial flutter, despite increasing amio. He is on Coumadin and INRs have been therapeutic. He has not felt bad out of rhythm thus far, however wife says he seems for fatigued. - He is not symptomatic. Continue amiodarone 200 mg bid. - Continue warfarin.  Check INR today. - Will set up with DCCV next week with Dr. Aundra Dubin. 3. CKD: Stage 3.  Recent SCr slightly elevated at 2.25. Follow BMET closely with increasing diuretics. 4. CAD: DES to LAD in 2/16.  No chest pain.  - No ASA given stable CAD and warfarin use.  - Continue atorvastatin 80 mg daily. 5. Suspect OSA: Snoring. Sleep study, pending.  DCCV with Dr. Aundra Dubin, follow up 2-3 weeks afterwards.  Maricela Bo [facility] FNP-BC 11/18/2020

## 2020-11-17 NOTE — H&P (View-Only) (Signed)
PCP: Dr. [name] Cardiology: Dr. Angelena Form HF Cardiology: Dr. Aundra Dubin  87 y.o. with history of persistent atrial fibrillation, CAD, and chronic systolic CHF returns for followup of CHF and atrial fibrillation.  Patient was admitted in 2/16 with afib/RVR.  Echo showed EF 25-30% at that time.  He had LHC, showing 90% mLAD stenosis that was treated with DES. In 4/16, he was cardioverted back to NSR.  Repeat echo in NSR showed EF 45-50%.  He was then admitted in 1/17 and again in 3/17 with afib/RVR (recurrent).  He did not have repeat DCCV. Echo in 3/17 showed EF 40% with mildly decreased RV systolic function.  He has remained in atrial fibrillation.   Last echo in 10/18 showed EF 55-60% with basal inferior akinesis, mildly dilated RV with mildly decreased RV systolic function.    He was admitted for Tikosyn initiation and converted to NSR, but Tikosyn had to be stopped because of prolonged QT interval. Amiodarone was started after; he is still on amiodarone today.   Patient returned for followup of CHF and atrial fibrillation 11/2017.  He was in sinus bradycardia. oprol was decreased to 50 mg due to SB.  Started having falls 6/21 and sinus bradycardia. Toprol was stopped and amiodarone decreased to 100 mg daily.  Today he returns for HF follow up with his wife. Last visit 2 weeks ago he was in atrial flutter, asymptomatic. Amio increased to 200 mg bid, with hopes of chemically converting. Now, overall feeling fine. Denies increasing SOB, CP, dizziness, edema, or PND/Orthopnea. Appetite ok. No fever or chills. Weight at home 173 pounds. Taking all medications.    ECG (personally reviewed): aflutter 63 bpm  Labs (5/17): LDL 64, HDL 66 Labs (7/18): K 4.3, creatinine 1.42 Labs (9/18): K 4.5, creatinine 1.46, BNP 501  Labs (11/18): K 4, creatinine 1.43, tbili 1.4, AST/ALT normal, TSH elevated but free T3 and free T4 normal.  Labs (1/19): LDL 93, HDL 63, hgb 13.5, TSH 9, free T3 and T4 normal, LFTs  normal, K 4.2, creatinine 1.57 Labs (12/21): K 4.6, creatinine 2.02, LDL 107, HDL 54, hgb 13.2, TSH 9.4 (Synthroid increased by PCP Labs (2/22): K 4.5, creatinine 2.25  PMH: 1. HTN 2. CKD stage 3 3. Hyperlipidemia 4. CAD:  - LHC 2/16 with 90% mLAD, 50% RCA => DES to mLAD.  5. 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  - Echo (2/16) with EF 25-30%, moderate MR while in afib.  - Echo (4/16) in NSR with EF 45-50%, no MR.  - Echo (3/17) in atrial fibrillation with EF 40%, mildly decreased RV systolic function.  - Echo (10/18): EF 55-60%, basal inferior akinesis, mild LVH, mildly dilated RV with mildly decreased systolic function, mild MR. 6. Atrial fibrillation: Persistent.  - 2/16 admission for afib/RVR => DCCV in 4/16.  - 1/17 admission with afib/RVR - 3/17 admission with afib/RVR 7. PFTs (11/18): moderate obstructive airways disease.   Social History   Socioeconomic History  . Marital status: Married    Spouse name: Not on file  . Number of children: Not on file  . Years of education: Not on file  . Highest education level: Not on file  Occupational History  . Not on file  Tobacco Use  . Smoking status: Never Smoker  . Smokeless tobacco: Never Used  . Tobacco comment: "smoked when I was 22"  Vaping Use  . Vaping Use: Never used  Substance and Sexual Activity  . Alcohol use: Yes  Alcohol/week: 7.0 - 14.0 standard drinks    Types: 7 - 14 Cans of beer per week    Comment: occ  . Drug use: No  . Sexual activity: Not Currently  Other Topics Concern  . Not on file  Social History Narrative  . Not on file   Social Determinants of Health   Financial Resource Strain: Not on file  Food Insecurity: Not on file  Transportation Needs: Not on file  Physical Activity: Not on file  Stress: Not on file  Social Connections: Not on file  Intimate Partner Violence: Not on file   Family History  Problem Relation Age of Onset  . Cancer  Mother        breast  . Cancer Father        colon   ROS: All systems reviewed and negative except as per HPI.   Current Outpatient Medications  Medication Sig Dispense Refill  . amiodarone (PACERONE) 200 MG tablet Take 200 mg by mouth 2 (two) times daily.    . Ascorbic Acid (VITAMIN C) 1000 MG tablet Take 1,000 mg by mouth daily.    Marland Kitchen atorvastatin (LIPITOR) 80 MG tablet TAKE 1 TABLET BY MOUTH ONCE DAILY AT  6PM 90 tablet 3  . calcium carbonate (TUMS - DOSED IN MG ELEMENTAL CALCIUM) 500 MG chewable tablet Chew 1 tablet by mouth as needed for indigestion or heartburn.    . furosemide (LASIX) 40 MG tablet TAKE 1 & 1/2 (ONE & ONE-HALF) TABLETS BY MOUTH ONCE DAILY 135 tablet 1  . gabapentin (NEURONTIN) 300 MG capsule Take 1 capsule (300 mg total) by mouth 3 (three) times daily. 90 capsule 5  . levothyroxine (SYNTHROID) 25 MCG tablet TAKE 1 TABLET BY MOUTH ONCE DAILY BEFORE BREAKFAST . APPOINTMENT REQUIRED FOR FUTURE REFILLS 90 tablet 0  . lisinopril (ZESTRIL) 2.5 MG tablet Take 1 tablet by mouth once daily 90 tablet 1  . nitroGLYCERIN (NITROSTAT) 0.4 MG SL tablet Place 1 tablet (0.4 mg total) under the tongue every 5 (five) minutes x 3 doses as needed for chest pain. 25 tablet 2  . potassium chloride (KLOR-CON) 10 MEQ tablet Take 1 tablet (10 mEq total) by mouth daily. 90 tablet 3  . primidone (MYSOLINE) 50 MG tablet Take 1 tablet (50 mg total) by mouth 2 (two) times daily. Take at 10am and 6pm 60 tablet 6  . triamcinolone (KENALOG) 0.1 % Apply 1 application topically 2 (two) times daily. 453.6 g 1  . warfarin (COUMADIN) 5 MG tablet TAKE AS DIRECTED BY  COUMADIN  CLINIC 40 tablet 0   No current facility-administered medications for this encounter.   Wt Readings from Last 3 Encounters:  11/18/20 80.9 kg (178 lb 6.4 oz)  11/02/20 77.1 kg (170 lb)  08/31/20 77.7 kg (171 lb 4.8 oz)   BP 119/72   Pulse 68   Wt 80.9 kg (178 lb 6.4 oz)   SpO2 98%   BMI 29.01 kg/m  General:  NAD. No resp  difficulty, elderly HEENT: Normal Neck: Supple. JVP ~6-7. Carotids 2+ bilat; no bruits. No lymphadenopathy or thryomegaly appreciated. Cor: PMI nondisplaced. Irregular rate & rhythm. No rubs, gallops or murmurs. Lungs: Clear Abdomen: Soft, nontender, nondistended. No hepatosplenomegaly. No bruits or masses. Good bowel sounds. Extremities: No cyanosis, clubbing, rash, 1+ LE edema, R>L Neuro: Alert & oriented x 3, cranial nerves grossly intact. Moves all 4 extremities w/o difficulty. Affect pleasant.  Assessment/Plan: 1. 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  EF  has fallen when he has been in atrial fibrillation. However, echo in 3/20 showed EF up to 55-60%.  NYHA III symptoms, likely due to general inactivity and deconditioning.  His volume is up on exam, weight up 8 lbs, Reds 40%. - His Toprol was stopped 6/21 due to SB and frequent falls w/ rib fractures. - Increase lasix to 80 mg daily x 3 days, then back to 60 mg daily. Repeat BMET next week. - Continue lisinopril 2.5 mg daily.  2. Atrial fibrillation: Paroxysmal.  He was unable to continue Tikosyn due to prolonged QT interval.  He is now on low-dose amiodarone, however he is still in atrial flutter, despite increasing amio. He is on Coumadin and INRs have been therapeutic. He has not felt bad out of rhythm thus far, however wife says he seems for fatigued. - He is not symptomatic. Continue amiodarone 200 mg bid. - Continue warfarin.  Check INR today. - Will set up with DCCV next week with Dr. Aundra Dubin. 3. CKD: Stage 3.  Recent SCr slightly elevated at 2.25. Follow BMET closely with increasing diuretics. 4. CAD: DES to LAD in 2/16.  No chest pain.  - No ASA given stable CAD and warfarin use.  - Continue atorvastatin 80 mg daily. 5. Suspect OSA: Snoring. Sleep study, pending.  DCCV with Dr. Aundra Dubin, follow up 2-3 weeks afterwards.  Maricela Bo [facility] FNP-BC 11/18/2020

## 2020-11-18 ENCOUNTER — Encounter (HOSPITAL_COMMUNITY): Payer: Self-pay

## 2020-11-18 ENCOUNTER — Ambulatory Visit (HOSPITAL_COMMUNITY)
Admission: RE | Admit: 2020-11-18 | Discharge: 2020-11-18 | Disposition: A | Discharge status: Home / Self Care | Payer: Medicare Other | Source: Ambulatory Visit | Attending: Family Medicine | Admitting: Family Medicine

## 2020-11-18 VITALS — BP 119/72 | HR 68 | Wt 178.4 lb

## 2020-11-18 DIAGNOSIS — Z955 Presence of coronary angioplasty implant and graft: Secondary | ICD-10-CM | POA: Diagnosis not present

## 2020-11-18 DIAGNOSIS — Z79890 Hormone replacement therapy (postmenopausal): Secondary | ICD-10-CM | POA: Diagnosis not present

## 2020-11-18 DIAGNOSIS — I4819 Other persistent atrial fibrillation: Secondary | ICD-10-CM | POA: Diagnosis not present

## 2020-11-18 DIAGNOSIS — Z7901 Long term (current) use of anticoagulants: Secondary | ICD-10-CM | POA: Diagnosis not present

## 2020-11-18 DIAGNOSIS — I130 Hypertensive heart and chronic kidney disease with heart failure and stage 1 through stage 4 chronic kidney disease, or unspecified chronic kidney disease: Secondary | ICD-10-CM | POA: Diagnosis not present

## 2020-11-18 DIAGNOSIS — Z79899 Other long term (current) drug therapy: Secondary | ICD-10-CM | POA: Insufficient documentation

## 2020-11-18 DIAGNOSIS — N183 Chronic kidney disease, stage 3 unspecified: Secondary | ICD-10-CM | POA: Diagnosis not present

## 2020-11-18 DIAGNOSIS — I5032 Chronic diastolic (congestive) heart failure: Secondary | ICD-10-CM

## 2020-11-18 DIAGNOSIS — I2510 Atherosclerotic heart disease of native coronary artery without angina pectoris: Secondary | ICD-10-CM | POA: Diagnosis not present

## 2020-11-18 DIAGNOSIS — I480 Paroxysmal atrial fibrillation: Secondary | ICD-10-CM

## 2020-11-18 DIAGNOSIS — I5022 Chronic systolic (congestive) heart failure: Secondary | ICD-10-CM | POA: Diagnosis not present

## 2020-11-18 DIAGNOSIS — R0683 Snoring: Secondary | ICD-10-CM | POA: Diagnosis not present

## 2020-11-18 DIAGNOSIS — I4892 Unspecified atrial flutter: Secondary | ICD-10-CM

## 2020-11-18 LAB — PROTIME-INR
INR: 2.5 — ABNORMAL HIGH (ref 0.8–1.2)
Prothrombin Time: 25.8 seconds — ABNORMAL HIGH (ref 11.4–15.2)

## 2020-11-18 NOTE — Progress Notes (Signed)
ReDS Vest / Clip - 11/18/20 1400      ReDS Vest / Clip   Station Marker A    Ruler Value 31    ReDS Value Range Moderate volume overload    ReDS Actual Value 40

## 2020-11-18 NOTE — Patient Instructions (Addendum)
INCREASE Lasix to 80mg  daily for 3 days, then resume 60 mg daily  Labs today We will only contact you if something comes back abnormal or we need to make some changes. Otherwise no news is good news!  Labs needed in 4-5 days  Your physician has recommended that you have a Cardioversion (DCCV). Electrical Cardioversion uses a jolt of electricity to your heart either through paddles or wired patches attached to your chest. This is a controlled, usually prescheduled, procedure. Defibrillation is done under light anesthesia in the hospital, and you usually go home the day of the procedure. This is done to get your heart back into a normal rhythm. You are not awake for the procedure. Please see the instruction sheet given to you today.  Your physician recommends that you schedule a follow-up appointment in: 3 weeks     You are scheduled for a TEE/Cardioversion/TEE Cardioversion on Friday November 25, 2020 with Dr. Aundra Dubin.  Please arrive at the [facility] (Main Entrance A) at [facility]: [address] [place], [place] [address] at 6:30 am. (1 hour prior to procedure unless lab work is needed; if lab work is needed arrive 1.5 hours ahead)  DIET: Nothing to eat or drink after midnight except a sip of water with medications (see medication instructions below)  Medication Instructions: Hold lasix  Continue your anticoagulant: Coumadin You will need to continue your anticoagulant after your procedure until you  are told by your  Provider that it is safe to stop   Labs: If patient is on Coumadin, patient needs pt/INR, CBC, BMET within 3 days (No pt/INR needed for patients taking Xarelto, Eliquis, Pradaxa) For patients receiving anesthesia for TEE and all Cardioversion patients: BMET, CBC within 1 week  Pre procedure labs needed on 11/22/20 Arrive at the CHF clinic at 1130   You will need a pre procedure COVID test    WHEN: 11/22/20  anytime between 9am-3pm WHERE: COVID Test Site [address] [place], [place] [address]  This is a drive thru testing site, you will remain in your car. Be sure to get in the line FOR PROCEDURES Once you have been swabbed you will need to remain home in quarantine until you return for your procedure.   You must have a responsible person to drive you home and stay in the waiting area during your procedure. Failure to do so could result in cancellation.  Bring your insurance cards.  *Special Note: Every effort is made to have your procedure done on time. Occasionally there are emergencies that occur at the hospital that may cause delays. Please be patient if a delay does occur.

## 2020-11-22 ENCOUNTER — Ambulatory Visit: Payer: Medicare Other

## 2020-11-22 ENCOUNTER — Other Ambulatory Visit: Payer: Self-pay

## 2020-11-22 ENCOUNTER — Encounter (HOSPITAL_COMMUNITY): Payer: Self-pay

## 2020-11-22 ENCOUNTER — Other Ambulatory Visit (HOSPITAL_COMMUNITY)
Admission: RE | Admit: 2020-11-22 | Discharge: 2020-11-22 | Disposition: A | Discharge status: Home / Self Care | Payer: Medicare Other | Source: Ambulatory Visit | Attending: Cardiology | Admitting: Cardiology

## 2020-11-22 ENCOUNTER — Telehealth (HOSPITAL_COMMUNITY): Payer: Self-pay | Admitting: Cardiology

## 2020-11-22 ENCOUNTER — Ambulatory Visit (HOSPITAL_COMMUNITY)
Admission: RE | Admit: 2020-11-22 | Discharge: 2020-11-22 | Disposition: A | Discharge status: Home / Self Care | Payer: Medicare Other | Source: Ambulatory Visit | Attending: Cardiology | Admitting: Cardiology

## 2020-11-22 DIAGNOSIS — Z20822 Contact with and (suspected) exposure to covid-19: Secondary | ICD-10-CM | POA: Insufficient documentation

## 2020-11-22 DIAGNOSIS — Z01812 Encounter for preprocedural laboratory examination: Secondary | ICD-10-CM | POA: Insufficient documentation

## 2020-11-22 DIAGNOSIS — R0683 Snoring: Secondary | ICD-10-CM

## 2020-11-22 DIAGNOSIS — I5032 Chronic diastolic (congestive) heart failure: Secondary | ICD-10-CM

## 2020-11-22 LAB — BASIC METABOLIC PANEL
Anion gap: 7 (ref 5–15)
BUN: 18 mg/dL (ref 8–23)
CO2: 28 mmol/L (ref 22–32)
Calcium: 8.1 mg/dL — ABNORMAL LOW (ref 8.9–10.3)
Chloride: 103 mmol/L (ref 98–111)
Creatinine, Ser: 2.13 mg/dL — ABNORMAL HIGH (ref 0.61–1.24)
GFR, Estimated: 29 mL/min — ABNORMAL LOW (ref >60)
Glucose, Bld: 110 mg/dL — ABNORMAL HIGH (ref 70–99)
Potassium: 4.2 mmol/L (ref 3.5–5.1)
Sodium: 138 mmol/L (ref 135–145)

## 2020-11-22 LAB — SARS CORONAVIRUS 2 (TAT 6-24 HRS): SARS Coronavirus 2: NEGATIVE

## 2020-11-22 MED ORDER — FUROSEMIDE 40 MG PO TABS: 40.0000 mg | ORAL_TABLET | Freq: Every day | ORAL | 1 refills | Status: DC

## 2020-11-22 NOTE — Telephone Encounter (Signed)
-----   Message from Rafael Bihari, [place] sent at 11/22/2020  1:04 PM EST ----- Labs better. Continue lisinopril, but cut lasix back to 40 mg daily. Will repeat labs at next appt in a couple weeks. Make sure he pays careful attention to daily weights and watching salt/fluid intake.

## 2020-11-22 NOTE — Telephone Encounter (Signed)
Pt aware and voiced understanding 

## 2020-11-22 NOTE — Procedures (Signed)
     Sleep Study Report  Patient Information Name: Ricardo Valencia  ID: [id number] Birth Date: [birth year]  Age: 87  Gender: Male Ordering Physician:  Loralie Champagne, MD Study Date:11/15/2020  TEST DESCRIPTION: Home sleep apnea testing was completed using the WatchPat, a Type 1 device, utilizing  peripheral arterial tonometry (PAT), chest movement, actigraphy, pulse oximetry, pulse rate, body position and snore.  AHI was calculated with apnea and hypopnea using valid sleep time as the denominator. RDI includes apneas,  hypopneas, and RERAs. The data acquired and the scoring of sleep and all associated events were performed in  accordance with the recommended standards and specifications as outlined in the AASM Manual for the Scoring of  Sleep and Associated Events 2.2.0 (2015).   FINDINGS: 1. No evidence of Obstructive Sleep Apnea with AHI 0.8/hr.  2. No Central Sleep Apnea. 3. Oxygen desaturations as low as 92%. 4. Moderate snoring was present. O2 sats were < 88% for 69minutes. 5. Total sleep time was 8 hrs and 3 min. 6. 5.9% of total sleep time was spent in REM sleep.  7. Normal sleep onset latency at 20 min.  8. Prolonged REM sleep onset latency at 237 min.  9. Total awakenings were 13.   DIAGNOSIS:  Normal study with no significant sleep disordered breathing.  RECOMMENDATIONS 1. Normal study with no significant sleep disordered breathing.  2. Healthy sleep recommendations include: adequate nightly sleep (normal 7-9 hrs/night), avoidance of caffeine after  noon and alcohol near bedtime, and maintaining a sleep environment that is cool, dark and quiet.  3. Weight loss for overweight patients is recommended.   4. Snoring recommendations include: weight loss where appropriate, side sleeping, and avoidance of alcohol before  Bed.  5. Operation of motor vehicle or dangerous equipment must be avoided when feeling drowsy, excessively sleepy, or  mentally fatigued.    6. An  ENT consultation which may be useful for specific causes of and possible treatment of bothersome snoring .   7. Weight loss may be of benefit in reducing the severity of snoring.   8.  Recommend in lab PSG to rule out sleep disordered breathing.   Report prepared by: Signature: Fransico Him, MD [facility] [place] Board of Sleep Medicine  Electronically Signed: Nov 22, 2020

## 2020-11-23 ENCOUNTER — Ambulatory Visit (INDEPENDENT_AMBULATORY_CARE_PROVIDER_SITE_OTHER): Payer: Medicare Other

## 2020-11-23 DIAGNOSIS — Z0000 Encounter for general adult medical examination without abnormal findings: Secondary | ICD-10-CM

## 2020-11-23 NOTE — Patient Instructions (Addendum)
Mr. Ricardo Valencia , Thank you for taking time to come for your Medicare Wellness Visit. I appreciate your ongoing commitment to your health goals. Please review the following plan we discussed and let me know if I can assist you in the future.   Screening recommendations/referrals: Colonoscopy: No longer required Recommended yearly ophthalmology/optometry visit for glaucoma screening and checkup Recommended yearly dental visit for hygiene and checkup  Vaccinations: Influenza vaccine: Done 08/31/20 Pneumococcal vaccine: Up to date Tdap vaccine: Up to date Shingles vaccine: Shingrix discussed. Please contact your pharmacy for coverage information.    Covid-19: Completed 12/24/19, 07/07/20  Advanced directives: Please bring a copy of your health care power of attorney and living will to the office at your convenience.  Conditions/risks identified: Get rid of pain from post shingles   Next appointment: Follow up in one year for your annual wellness visit.   Preventive Care 41 Years and Older, Male Preventive care refers to lifestyle choices and visits with your health care provider that can promote health and wellness. What does preventive care include?  A yearly physical exam. This is also called an annual well check.  Dental exams once or twice a year.  Routine eye exams. Ask your health care provider how often you should have your eyes checked.  Personal lifestyle choices, including:  Daily care of your teeth and gums.  Regular physical activity.  Eating a healthy diet.  Avoiding tobacco and drug use.  Limiting alcohol use.  Practicing safe sex.  Taking low doses of aspirin every day.  Taking vitamin and mineral supplements as recommended by your health care provider. What happens during an annual well check? The services and screenings done by your health care provider during your annual well check will depend on your age, overall health, lifestyle risk factors, and family  history of disease. Counseling  Your health care provider may ask you questions about your:  Alcohol use.  Tobacco use.  Drug use.  Emotional well-being.  Home and relationship well-being.  Sexual activity.  Eating habits.  History of falls.  Memory and ability to understand (cognition).  Work and work Statistician. Screening  You may have the following tests or measurements:  Height, weight, and BMI.  Blood pressure.  Lipid and cholesterol levels. These may be checked every 5 years, or more frequently if you are over 74 years old.  Skin check.  Lung cancer screening. You may have this screening every year starting at age 69 if you have a 30-pack-year history of smoking and currently smoke or have quit within the past 15 years.  Fecal occult blood test (FOBT) of the stool. You may have this test every year starting at age 73.  Flexible sigmoidoscopy or colonoscopy. You may have a sigmoidoscopy every 5 years or a colonoscopy every 10 years starting at age 39.  Prostate cancer screening. Recommendations will vary depending on your family history and other risks.  Hepatitis C blood test.  Hepatitis B blood test.  Sexually transmitted disease (STD) testing.  Diabetes screening. This is done by checking your blood sugar (glucose) after you have not eaten for a while (fasting). You may have this done every 1-3 years.  Abdominal aortic aneurysm (AAA) screening. You may need this if you are a current or former smoker.  Osteoporosis. You may be screened starting at age 47 if you are at high risk. Talk with your health care provider about your test results, treatment options, and if necessary, the need for more tests.  Vaccines  Your health care provider may recommend certain vaccines, such as:  Influenza vaccine. This is recommended every year.  Tetanus, diphtheria, and acellular pertussis (Tdap, Td) vaccine. You may need a Td booster every 10 years.  Zoster vaccine.  You may need this after age 22.  Pneumococcal 13-valent conjugate (PCV13) vaccine. One dose is recommended after age 3.  Pneumococcal polysaccharide (PPSV23) vaccine. One dose is recommended after age 80. Talk to your health care provider about which screenings and vaccines you need and how often you need them. This information is not intended to replace advice given to you by your health care provider. Make sure you discuss any questions you have with your health care provider. Document Released: 09/30/2015 Document Revised: 05/23/2016 Document Reviewed: 07/05/2015 Elsevier Interactive Patient Education  2017 [place] Prevention in the Home Falls can cause injuries. They can happen to people of all ages. There are many things you can do to make your home safe and to help prevent falls. What can I do on the outside of my home?  Regularly fix the edges of walkways and driveways and fix any cracks.  Remove anything that might make you trip as you walk through a door, such as a raised step or threshold.  Trim any bushes or trees on the path to your home.  Use bright outdoor lighting.  Clear any walking paths of anything that might make someone trip, such as rocks or tools.  Regularly check to see if handrails are loose or broken. Make sure that both sides of any steps have handrails.  Any raised decks and porches should have guardrails on the edges.  Have any leaves, snow, or ice cleared regularly.  Use sand or salt on walking paths during winter.  Clean up any spills in your garage right away. This includes oil or grease spills. What can I do in the bathroom?  Use night lights.  Install grab bars by the toilet and in the tub and shower. Do not use towel bars as grab bars.  Use non-skid mats or decals in the tub or shower.  If you need to sit down in the shower, use a plastic, non-slip stool.  Keep the floor dry. Clean up any water that spills on the floor as soon  as it happens.  Remove soap buildup in the tub or shower regularly.  Attach bath mats securely with double-sided non-slip rug tape.  Do not have throw rugs and other things on the floor that can make you trip. What can I do in the bedroom?  Use night lights.  Make sure that you have a light by your bed that is easy to reach.  Do not use any sheets or blankets that are too big for your bed. They should not hang down onto the floor.  Have a firm chair that has side arms. You can use this for support while you get dressed.  Do not have throw rugs and other things on the floor that can make you trip. What can I do in the kitchen?  Clean up any spills right away.  Avoid walking on wet floors.  Keep items that you use a lot in easy-to-reach places.  If you need to reach something above you, use a strong step stool that has a grab bar.  Keep electrical cords out of the way.  Do not use floor polish or wax that makes floors slippery. If you must use wax, use non-skid floor wax.  Do not have throw rugs and other things on the floor that can make you trip. What can I do with my stairs?  Do not leave any items on the stairs.  Make sure that there are handrails on both sides of the stairs and use them. Fix handrails that are broken or loose. Make sure that handrails are as long as the stairways.  Check any carpeting to make sure that it is firmly attached to the stairs. Fix any carpet that is loose or worn.  Avoid having throw rugs at the top or bottom of the stairs. If you do have throw rugs, attach them to the floor with carpet tape.  Make sure that you have a light switch at the top of the stairs and the bottom of the stairs. If you do not have them, ask someone to add them for you. What else can I do to help prevent falls?  Wear shoes that:  Do not have high heels.  Have rubber bottoms.  Are comfortable and fit you well.  Are closed at the toe. Do not wear sandals.  If  you use a stepladder:  Make sure that it is fully opened. Do not climb a closed stepladder.  Make sure that both sides of the stepladder are locked into place.  Ask someone to hold it for you, if possible.  Clearly mark and make sure that you can see:  Any grab bars or handrails.  First and last steps.  Where the edge of each step is.  Use tools that help you move around (mobility aids) if they are needed. These include:  Canes.  Walkers.  Scooters.  Crutches.  Turn on the lights when you go into a dark area. Replace any light bulbs as soon as they burn out.  Set up your furniture so you have a clear path. Avoid moving your furniture around.  If any of your floors are uneven, fix them.  If there are any pets around you, be aware of where they are.  Review your medicines with your doctor. Some medicines can make you feel dizzy. This can increase your chance of falling. Ask your doctor what other things that you can do to help prevent falls. This information is not intended to replace advice given to you by your health care provider. Make sure you discuss any questions you have with your health care provider. Document Released: 06/30/2009 Document Revised: 02/09/2016 Document Reviewed: 10/08/2014 Elsevier Interactive Patient Education  2017 [employer].

## 2020-11-23 NOTE — Progress Notes (Signed)
Virtual Visit via Telephone Note  I connected with  Ricardo Valencia on 11/23/20 at 11:45 AM EST by telephone and verified that I am speaking with the correct person using two identifiers.  Medicare Annual Wellness visit completed telephonically due to Covid-19 pandemic.   Persons participating in this call: This Health Coach and this patient and wife [name]  Location: Patient: Home Provider: office   I discussed the limitations, risks, security and privacy concerns of performing an evaluation and management service by telephone and the availability of in person appointments. The patient expressed understanding and agreed to proceed.  Unable to perform video visit due to video visit attempted and failed and/or patient does not have video capability.   Some vital signs may be absent or patient reported.   Willette Brace, LPN    Subjective:   Ricardo Valencia is a 87 y.o. male who presents for Medicare Annual/Subsequent preventive examination.  Review of Systems     Cardiac Risk Factors include: advanced age (>95men, >37 women);hypertension;male gender;dyslipidemia     Objective:    Today's Vitals   11/23/20 1125  PainSc: 5    There is no height or weight on file to calculate BMI.  Advanced Directives 11/23/2020 02/18/2019 02/12/2019 12/08/2018 04/01/2018 07/16/2017 04/12/2017  Does Patient Have a Medical Advance Directive? No No No;Yes No No Yes Yes  Type of Advance Directive - - Living will - - Living will Living will;Healthcare Power of Attorney  Does patient want to make changes to medical advance directive? - No - Patient declined No - Patient declined - - No - Patient declined -  Copy of [place] in Chart? - - - - - - No - copy requested  Would patient like information on creating a medical advance directive? No - Patient declined No - Patient declined No - Patient declined No - Patient declined No - Patient declined - -    Current Medications  (verified) Outpatient Encounter Medications as of 11/23/2020  Medication Sig  . amiodarone (PACERONE) 200 MG tablet Take 100 mg by mouth daily at 12 noon.  . Ascorbic Acid (VITAMIN C) 1000 MG tablet Take 1,000 mg by mouth daily.  Marland Kitchen atorvastatin (LIPITOR) 80 MG tablet TAKE 1 TABLET BY MOUTH ONCE DAILY AT  6PM (Patient taking differently: Take 80 mg by mouth at bedtime.)  . calcium carbonate (TUMS - DOSED IN MG ELEMENTAL CALCIUM) 500 MG chewable tablet Chew 500 mg by mouth daily.  . furosemide (LASIX) 40 MG tablet Take 1 tablet (40 mg total) by mouth daily. (Patient taking differently: Take 60 mg by mouth daily.)  . gabapentin (NEURONTIN) 300 MG capsule Take 1 capsule (300 mg total) by mouth 3 (three) times daily. (Patient taking differently: Take 900 mg by mouth at bedtime.)  . levothyroxine (SYNTHROID) 25 MCG tablet TAKE 1 TABLET BY MOUTH ONCE DAILY BEFORE BREAKFAST . APPOINTMENT REQUIRED FOR FUTURE REFILLS (Patient taking differently: Take 25 mcg by mouth daily before breakfast.)  . lisinopril (ZESTRIL) 2.5 MG tablet Take 1 tablet by mouth once daily (Patient taking differently: Take 2.5 mg by mouth daily.)  . nitroGLYCERIN (NITROSTAT) 0.4 MG SL tablet Place 1 tablet (0.4 mg total) under the tongue every 5 (five) minutes x 3 doses as needed for chest pain.  . potassium chloride (KLOR-CON) 10 MEQ tablet Take 1 tablet (10 mEq total) by mouth daily. (Patient taking differently: Take 10 mEq by mouth at bedtime.)  . primidone (MYSOLINE) 50 MG tablet Take  1 tablet (50 mg total) by mouth 2 (two) times daily. Take at 10am and 6pm  . triamcinolone (KENALOG) 0.1 % Apply 1 application topically 2 (two) times daily. (Patient taking differently: Apply 1 application topically daily as needed (Rash).)  . warfarin (COUMADIN) 5 MG tablet TAKE AS DIRECTED BY  COUMADIN  CLINIC (Patient taking differently: Take 5-7.5 mg by mouth See admin instructions. Take 5 mg all the other days Take 7.5 mg on Sunday and Friday)   No  facility-administered encounter medications on file as of 11/23/2020.    Allergies (verified) Patient has no known allergies.   History: Past Medical History:  Diagnosis Date  . CAD (coronary artery disease)    DES to mid LAD 11/10/2014  . Chronic systolic heart failure ([place])   . Hay fever    hay fever, allergies  . History of echocardiogram    Echo 3/17: EF 40%, diff HK, trivial AI, trivial MR, mod LAE, mild reduced RVSF, mod RAE  . Hyperlipidemia   . Hypertension   . Persistent atrial fibrillation [facility])    Past Surgical History:  Procedure Laterality Date  . APPENDECTOMY  1947  . CARDIOVERSION N/A 12/30/2014   Procedure: CARDIOVERSION;  Surgeon: Jerline Pain, MD;  Location: [place];  Service: Cardiovascular;  Laterality: N/A;  . CORONARY ANGIOPLASTY WITH STENT PLACEMENT    . LEFT HEART CATHETERIZATION WITH CORONARY ANGIOGRAM N/A 11/10/2014   Procedure: LEFT HEART CATHETERIZATION WITH CORONARY ANGIOGRAM;  Surgeon: Wellington Hampshire, MD;  Location: [place] CATH LAB;  Service: Cardiovascular;  Laterality: N/A;  . PERCUTANEOUS CORONARY STENT INTERVENTION (PCI-S)  11/10/2014   Procedure: PERCUTANEOUS CORONARY STENT INTERVENTION (PCI-S);  Surgeon: Wellington Hampshire, MD;  Location: [facility] CATH LAB;  Service: Cardiovascular;;   Family History  Problem Relation Age of Onset  . Cancer Mother        breast  . Cancer Father        colon   Social History   Socioeconomic History  . Marital status: Married    Spouse name: Not on file  . Number of children: Not on file  . Years of education: Not on file  . Highest education level: Not on file  Occupational History  . Occupation: retired  Tobacco Use  . Smoking status: Never Smoker  . Smokeless tobacco: Never Used  . Tobacco comment: "smoked when I was 22"  Vaping Use  . Vaping Use: Never used  Substance and Sexual Activity  . Alcohol use: Yes    Alcohol/week: 7.0 - 14.0 standard drinks    Types: 7 - 14 Cans of beer per week     Comment: occ  . Drug use: No  . Sexual activity: Not Currently  Other Topics Concern  . Not on file  Social History Narrative  . Not on file   Social Determinants of Health   Financial Resource Strain: Low Risk   . Difficulty of Paying Living Expenses: Not hard at all  Food Insecurity: No Food Insecurity  . Worried About Charity fundraiser in the Last Year: Never true  . Ran Out of Food in the Last Year: Never true  Transportation Needs: No Transportation Needs  . Lack of Transportation (Medical): No  . Lack of Transportation (Non-Medical): No  Physical Activity: Sufficiently Active  . Days of Exercise per Week: 5 days  . Minutes of Exercise per Session: 30 min  Stress: Stress Concern Present  . Feeling of Stress : To some extent  Social  Connections: Moderately Isolated  . Frequency of Communication with Friends and Family: More than three times a week  . Frequency of Social Gatherings with Friends and Family: Three times a week  . Attends Religious Services: Never  . Active Member of Clubs or Organizations: No  . Attends Archivist Meetings: Never  . Marital Status: Married    Tobacco Counseling Counseling given: Not Answered Comment: "smoked when I was 22"   Clinical Intake:  Pre-visit preparation completed: Yes  Pain : 0-10 Pain Score: 5  Pain Location: Jaw (right side jaw ear and neck pain) Pain Orientation: Right Pain Descriptors / Indicators: Sharp Pain Onset: More than a month ago Pain Frequency: Intermittent     BMI - recorded: 29.01 Nutritional Status: BMI 25 -29 Overweight Nutritional Risks: None Diabetes: No  How often do you need to have someone help you when you read instructions, pamphlets, or other written materials from your doctor or pharmacy?: 1 - Never  Diabetic?No  Interpreter Needed?: No  Information entered by :: Charlott Rakes, LPN   Activities of Daily Living In your present state of health, do you have any  difficulty performing the following activities: 11/23/2020  Hearing? Y  Vision? N  Difficulty concentrating or making decisions? N  Walking or climbing stairs? N  Dressing or bathing? N  Doing errands, shopping? N  Preparing Food and eating ? N  Using the Toilet? N  In the past six months, have you accidently leaked urine? Y  Comment urgency at times  Do you have problems with loss of bowel control? N  Managing your Medications? N  Managing your Finances? N  Housekeeping or managing your Housekeeping? N  Some recent data might be hidden    Patient Care Team: Eulas Post, MD as PCP - General (Family Medicine) Burnell Blanks, MD as PCP - Cardiology (Cardiology) Burnell Blanks, MD as Consulting Physician (Cardiology)  Indicate any recent Medical Services you may have received from other than [facility] providers in the past year (date may be approximate).     Assessment:   This is a routine wellness examination for Shadee.  Hearing/Vision screen  Hearing Screening   125Hz  250Hz  500Hz  1000Hz  2000Hz  3000Hz  4000Hz  6000Hz  8000Hz   Right ear:           Left ear:           Comments: Pt has mild loss  Vision Screening Comments: Pt hasn't been able to follow up last 2 years  Dietary issues and exercise activities discussed: Current Exercise Habits: Home exercise routine, Type of exercise: walking, Time (Minutes): 30, Frequency (Times/Week): 5, Weekly Exercise (Minutes/Week): 150  Goals    .  patient (pt-stated)      Maintain current health by staying active.     .  to get the pain from shingles      Depression Screen PHQ 2/9 Scores 11/23/2020 02/18/2019 04/12/2017 12/23/2014  PHQ - 2 Score 0 0 0 0    Fall Risk Fall Risk  11/23/2020 02/18/2019 06/10/2018 03/07/2018 04/12/2017  Falls in the past year? 0 1 Yes Yes No  Comment - - - - -  Number falls in past yr: 0 1 2 or more 2 or more -  Injury with Fall? 0 1 Yes Yes -  Risk Factor Category  - - High Fall Risk - -  Risk  for fall due to : Impaired balance/gait;Impaired mobility History of fall(s);Impaired balance/gait History of fall(s);Impaired balance/gait - -  Follow up Falls prevention discussed - Education provided;Falls prevention discussed - -    FALL RISK PREVENTION PERTAINING TO THE HOME:  Any stairs in or around the home? Yes  If so, are there any without handrails? No  Home free of loose throw rugs in walkways, pet beds, electrical cords, etc? Yes  Adequate lighting in your home to reduce risk of falls? Yes   ASSISTIVE DEVICES UTILIZED TO PREVENT FALLS:  Life alert? No  Use of a cane, walker or w/c? Yes  Grab bars in the bathroom? No  Shower chair or bench in shower? No  Elevated toilet seat or a handicapped toilet? No   TIMED UP AND GO:  Was the test performed? No     Cognitive Function:     6CIT Screen 11/23/2020  What Year? 0 points  What month? 0 points  Count back from 20 0 points  Months in reverse 0 points  Repeat phrase 6 points    Immunizations Immunization History  Administered Date(s) Administered  . Fluad Quad(high Dose 65+) 08/31/2020  . Influenza, High Dose Seasonal PF 06/14/2017, 09/12/2018  . PFIZER(Purple Top)SARS-COV-2 Vaccination 12/24/2019, 07/07/2020  . Pneumococcal Conjugate-13 12/23/2014  . Pneumococcal Polysaccharide-23 03/02/2016  . Tdap 01/07/2014    TDAP status: Up to date  Flu Vaccine status: Up to date  Pneumococcal vaccine status: Up to date  Covid-19 vaccine status: Completed vaccines  Qualifies for Shingles Vaccine? Yes   Zostavax completed No   Shingrix Completed?: No.    Education has been provided regarding the importance of this vaccine. Patient has been advised to call insurance company to determine out of pocket expense if they have not yet received this vaccine. Advised may also receive vaccine at local pharmacy or Health Dept. Verbalized acceptance and understanding.  Screening Tests Health Maintenance  Topic Date Due  .  COVID-19 Vaccine (3 - Pfizer risk 4-dose series) 08/04/2020  . TETANUS/TDAP  01/08/2024  . INFLUENZA VACCINE  Completed  . PNA vac Low Risk Adult  Completed  . HPV VACCINES  Aged Out    Health Maintenance  Health Maintenance Due  Topic Date Due  . COVID-19 Vaccine (3 - Pfizer risk 4-dose series) 08/04/2020    Colorectal cancer screening: No longer required.   Additional Screening:   Vision Screening: Recommended annual ophthalmology exams for early detection of glaucoma and other disorders of the eye. Is the patient up to date with their annual eye exam?  No  Who is the provider or what is the name of the office in which the patient attends annual eye exams? Hasn't seen a provider in 2 years  If pt is not established with a provider, would they like to be referred to a provider to establish care? No .   Dental Screening: Recommended annual dental exams for proper oral hygiene  Community Resource Referral / Chronic Care Management: CRR required this visit?  No   CCM required this visit?  No      Plan:     I have personally reviewed and noted the following in the patient's chart:   . Medical and social history . Use of alcohol, tobacco or illicit drugs  . Current medications and supplements . Functional ability and status . Nutritional status . Physical activity . Advanced directives . List of other physicians . Hospitalizations, surgeries, and ER visits in previous 12 months . Vitals . Screenings to include cognitive, depression, and falls . Referrals and appointments  In addition, I have reviewed  and discussed with patient certain preventive protocols, quality metrics, and best practice recommendations. A written personalized care plan for preventive services as well as general preventive health recommendations were provided to patient.     Willette Brace, LPN   09/18/2581   Nurse Notes: Pt would like to have a referral for pain management concerning post pain  from Shingles that is on going on right side of ear, neck and now going into jaw. Please advise. He also wanted to make you aware that he will have a cardioversion on 11/25/20.

## 2020-11-24 ENCOUNTER — Telehealth: Payer: Self-pay | Admitting: *Deleted

## 2020-11-24 ENCOUNTER — Other Ambulatory Visit (HOSPITAL_COMMUNITY): Payer: Self-pay | Admitting: *Deleted

## 2020-11-24 DIAGNOSIS — I4891 Unspecified atrial fibrillation: Secondary | ICD-10-CM

## 2020-11-24 NOTE — Telephone Encounter (Signed)
-----   Message from Sueanne Margarita, MD sent at 11/22/2020 12:16 PM EST ----- No OSA On home sleep study therefore recommended proceeding with in lab PSG

## 2020-11-24 NOTE — Telephone Encounter (Signed)
Left message to return a call to Ricardo Valencia to discuss sleep study results and recommendations.

## 2020-11-24 NOTE — Telephone Encounter (Signed)
Informed patient of sleep study results and patient understanding was verbalized. Patient understands his sleep study showed No OSA On home sleep study therefore recommended proceeding with in lab PSG. Patient's wife wants to discuss the sleep sudy with dr Aundra Dubin before taking another sleep est. She will call back with their decision.

## 2020-11-25 ENCOUNTER — Encounter (HOSPITAL_COMMUNITY)
Admission: RE | Disposition: A | Discharge status: Home / Self Care | Payer: Self-pay | Source: Home / Self Care | Attending: Cardiology

## 2020-11-25 ENCOUNTER — Ambulatory Visit (HOSPITAL_COMMUNITY)
Admission: RE | Admit: 2020-11-25 | Discharge: 2020-11-25 | Disposition: A | Discharge status: Home / Self Care | Payer: Medicare Other | Attending: Cardiology | Admitting: Cardiology

## 2020-11-25 ENCOUNTER — Ambulatory Visit (HOSPITAL_COMMUNITY): Payer: Medicare Other | Admitting: Certified Registered Nurse Anesthetist

## 2020-11-25 ENCOUNTER — Encounter (HOSPITAL_COMMUNITY): Payer: Self-pay | Admitting: Cardiology

## 2020-11-25 ENCOUNTER — Other Ambulatory Visit: Payer: Self-pay

## 2020-11-25 DIAGNOSIS — Z79890 Hormone replacement therapy (postmenopausal): Secondary | ICD-10-CM | POA: Insufficient documentation

## 2020-11-25 DIAGNOSIS — Z7901 Long term (current) use of anticoagulants: Secondary | ICD-10-CM | POA: Diagnosis not present

## 2020-11-25 DIAGNOSIS — I214 Non-ST elevation (NSTEMI) myocardial infarction: Secondary | ICD-10-CM | POA: Diagnosis not present

## 2020-11-25 DIAGNOSIS — I4891 Unspecified atrial fibrillation: Secondary | ICD-10-CM | POA: Diagnosis not present

## 2020-11-25 DIAGNOSIS — E039 Hypothyroidism, unspecified: Secondary | ICD-10-CM | POA: Diagnosis not present

## 2020-11-25 DIAGNOSIS — I4819 Other persistent atrial fibrillation: Secondary | ICD-10-CM | POA: Diagnosis not present

## 2020-11-25 DIAGNOSIS — E785 Hyperlipidemia, unspecified: Secondary | ICD-10-CM | POA: Diagnosis not present

## 2020-11-25 DIAGNOSIS — Z79899 Other long term (current) drug therapy: Secondary | ICD-10-CM | POA: Diagnosis not present

## 2020-11-25 HISTORY — PX: CARDIOVERSION: SHX1299

## 2020-11-25 LAB — PROTIME-INR
INR: 3.2 — ABNORMAL HIGH (ref 0.8–1.2)
Prothrombin Time: 31.9 seconds — ABNORMAL HIGH (ref 11.4–15.2)

## 2020-11-25 SURGERY — CARDIOVERSION
Anesthesia: General

## 2020-11-25 MED ORDER — PROPOFOL 10 MG/ML IV BOLUS
INTRAVENOUS | Status: DC | PRN
  Administered 2020-11-25: 50 mg via INTRAVENOUS

## 2020-11-25 MED ORDER — LIDOCAINE 2% (20 MG/ML) 5 ML SYRINGE
INTRAMUSCULAR | Status: DC | PRN
  Administered 2020-11-25: 40 mg via INTRAVENOUS

## 2020-11-25 NOTE — Discharge Instructions (Signed)
Electrical Cardioversion Electrical cardioversion is the delivery of a jolt of electricity to restore a normal rhythm to the heart. A rhythm that is too fast or is not regular keeps the heart from pumping well. In this procedure, sticky patches or metal paddles are placed on the chest to deliver electricity to the heart from a device. This procedure may be done in an emergency if:  There is low or no blood pressure as a result of the heart rhythm.  Normal rhythm must be restored as fast as possible to protect the brain and heart from further damage.  It may save a life. This may also be a scheduled procedure for irregular or fast heart rhythms that are not immediately life-threatening. Tell a health care provider about:  Any allergies you have.  All medicines you are taking, including vitamins, herbs, eye drops, creams, and over-the-counter medicines.  Any problems you or family members have had with anesthetic medicines.  Any blood disorders you have.  Any surgeries you have had.  Any medical conditions you have.  Whether you are pregnant or may be pregnant. What are the risks? Generally, this is a safe procedure. However, problems may occur, including:  Allergic reactions to medicines.  A blood clot that breaks free and travels to other parts of your body.  The possible return of an abnormal heart rhythm within hours or days after the procedure.  Your heart stopping (cardiac arrest). This is rare. What happens before the procedure? Medicines  Your health care provider may have you start taking: ? Blood-thinning medicines (anticoagulants) so your blood does not clot as easily. ? Medicines to help stabilize your heart rate and rhythm.  Ask your health care provider about: ? Changing or stopping your regular medicines. This is especially important if you are taking diabetes medicines or blood thinners. ? Taking medicines such as aspirin and ibuprofen. These medicines can  thin your blood. Do not take these medicines unless your health care provider tells you to take them. ? Taking over-the-counter medicines, vitamins, herbs, and supplements. General instructions  Follow instructions from your health care provider about eating or drinking restrictions.  Plan to have someone take you home from the hospital or clinic.  If you will be going home right after the procedure, plan to have someone with you for 24 hours.  Ask your health care provider what steps will be taken to help prevent infection. These may include washing your skin with a germ-killing soap. What happens during the procedure?  An IV will be inserted into one of your veins.  Sticky patches (electrodes) or metal paddles may be placed on your chest.  You will be given a medicine to help you relax (sedative).  An electrical shock will be delivered. The procedure may vary among health care providers and hospitals.   What can I expect after the procedure?  Your blood pressure, heart rate, breathing rate, and blood oxygen level will be monitored until you leave the hospital or clinic.  Your heart rhythm will be watched to make sure it does not change.  You may have some redness on the skin where the shocks were given. Follow these instructions at home:  Do not drive for 24 hours if you were given a sedative during your procedure.  Take over-the-counter and prescription medicines only as told by your health care provider.  Ask your health care provider how to check your pulse. Check it often.  Rest for 48 hours after the procedure   or as told by your health care provider.  Avoid or limit your caffeine use as told by your health care provider.  Keep all follow-up visits as told by your health care provider. This is important. Contact a health care provider if:  You feel like your heart is beating too quickly or your pulse is not regular.  You have a serious muscle cramp that does not go  away. Get help right away if:  You have discomfort in your chest.  You are dizzy or you feel faint.  You have trouble breathing or you are short of breath.  Your speech is slurred.  You have trouble moving an arm or leg on one side of your body.  Your fingers or toes turn cold or blue. Summary  Electrical cardioversion is the delivery of a jolt of electricity to restore a normal rhythm to the heart.  This procedure may be done right away in an emergency or may be a scheduled procedure if the condition is not an emergency.  Generally, this is a safe procedure.  After the procedure, check your pulse often as told by your health care provider. This information is not intended to replace advice given to you by your health care provider. Make sure you discuss any questions you have with your health care provider. Document Revised: 04/06/2019 Document Reviewed: 04/06/2019 Elsevier Patient Education  2021 Elsevier Inc[employer]  

## 2020-11-25 NOTE — Transfer of Care (Signed)
Immediate Anesthesia Transfer of Care Note  Patient: Ricardo Valencia  Procedure(s) Performed: CARDIOVERSION (N/A )  Patient Location: Endoscopy Unit  Anesthesia Type:General  Level of Consciousness: drowsy and patient cooperative  Airway & Oxygen Therapy: Patient Spontanous Breathing  Post-op Assessment: Report given to RN and Post -op Vital signs reviewed and stable  Post vital signs: Reviewed and stable  Last Vitals:  Vitals Value Taken Time  BP 118/40 11/25/20 0853  Temp    Pulse 51 11/25/20 0854  Resp 21 11/25/20 0854  SpO2 93 % 11/25/20 0854    Last Pain:  Vitals:   11/25/20 0701  TempSrc: Oral  PainSc: 0-No pain         Complications: No complications documented.

## 2020-11-25 NOTE — Anesthesia Preprocedure Evaluation (Addendum)
Anesthesia Evaluation  Patient identified by MRN, date of birth, ID band Patient awake    Reviewed: Allergy & Precautions, NPO status , Patient's Chart, lab work & pertinent test results  Airway Mallampati: I  TM Distance: >3 FB Neck ROM: Full    Dental  (+) Edentulous Upper, Edentulous Lower, Dental Advisory Given   Pulmonary neg pulmonary ROS,    Pulmonary exam normal breath sounds clear to auscultation       Cardiovascular hypertension, + CAD, + Past MI, + Cardiac Stents (2016) and +CHF  Normal cardiovascular exam+ dysrhythmias (on coumadin) Atrial Fibrillation  Rhythm:Irregular Rate:Normal  TTE 2020 1. The left ventricle has normal systolic function, with an ejection  fraction of 55-60%. The cavity size was normal. Left ventricular diastolic  Doppler parameters are consistent with pseudonormalization.  2. The right ventricle has normal systolic function. The cavity was  normal. There is no increase in right ventricular wall thickness.  3. Left atrial size was moderately dilated.  4. The mitral valve is normal in structure. Mild thickening of the mitral  valve leaflet. Mild calcification of the mitral valve leaflet.  5. The tricuspid valve is normal in structure.  6. The aortic valve has an indeterminant number of cusps Moderate  thickening of the aortic valve Moderate calcification of the aortic valve.  Aortic valve regurgitation is mild by color flow Doppler. mild stenosis of  the aortic valve.  7. The pulmonic valve was grossly normal. Pulmonic valve regurgitation is  mild by color flow Doppler.  8. There is mild dilatation of the aortic root measuring 39 mm.   Neuro/Psych negative neurological ROS  negative psych ROS   GI/Hepatic negative GI ROS, Neg liver ROS,   Endo/Other  Hypothyroidism   Renal/GU Renal InsufficiencyRenal disease (Cr 2.13, K 4.2)  negative genitourinary   Musculoskeletal negative  musculoskeletal ROS (+)   Abdominal   Peds  Hematology negative hematology ROS (+)   Anesthesia Other Findings   Reproductive/Obstetrics                            Anesthesia Physical Anesthesia Plan  ASA: III  Anesthesia Plan: General   Post-op Pain Management:    Induction: Intravenous  PONV Risk Score and Plan: 2 and Propofol infusion and Treatment may vary due to age or medical condition  Airway Management Planned: Natural Airway  Additional Equipment:   Intra-op Plan:   Post-operative Plan:   Informed Consent: I have reviewed the patients History and Physical, chart, labs and discussed the procedure including the risks, benefits and alternatives for the proposed anesthesia with the patient or authorized representative who has indicated his/her understanding and acceptance.     Dental advisory given  Plan Discussed with: CRNA  Anesthesia Plan Comments:         Anesthesia Quick Evaluation

## 2020-11-25 NOTE — Anesthesia Procedure Notes (Signed)
Procedure Name: General with mask airway Date/Time: 11/25/2020 8:46 AM Performed by: Colin Benton, CRNA Pre-anesthesia Checklist: Patient identified, Emergency Drugs available, Suction available, Patient being monitored and Timeout performed Patient Re-evaluated:Patient Re-evaluated prior to induction Oxygen Delivery Method: Ambu bag Preoxygenation: Pre-oxygenation with 100% oxygen Induction Type: IV induction Ventilation: Mask ventilation without difficulty Placement Confirmation: positive ETCO2 Dental Injury: Teeth and Oropharynx as per pre-operative assessment

## 2020-11-25 NOTE — Anesthesia Postprocedure Evaluation (Signed)
Anesthesia Post Note  Patient: Raedyn Wenke Polak  Procedure(s) Performed: CARDIOVERSION (N/A )     Patient location during evaluation: Endoscopy Anesthesia Type: General Level of consciousness: awake and alert Pain management: pain level controlled Vital Signs Assessment: post-procedure vital signs reviewed and stable Respiratory status: spontaneous breathing, nonlabored ventilation, respiratory function stable and patient connected to nasal cannula oxygen Cardiovascular status: blood pressure returned to baseline and stable Postop Assessment: no apparent nausea or vomiting Anesthetic complications: no   No complications documented.  Last Vitals:  Vitals:   11/25/20 0910 11/25/20 0919  BP: 130/61 (!) 128/52  Pulse: (!) 43 (!) 59  Resp: 15 13  Temp:    SpO2: 99% 98%    Last Pain:  Vitals:   11/25/20 0910  TempSrc:   PainSc: 0-No pain                 Stanislav Gervase L Cymone Yeske

## 2020-11-25 NOTE — Procedures (Signed)
Electrical Cardioversion Procedure Note JOVANIE VERGE [id number] 1929/05/08  Procedure: Electrical Cardioversion Indications:  Atrial Fibrillation  Procedure Details Consent: Risks of procedure as well as the alternatives and risks of each were explained to the (patient/caregiver).  Consent for procedure obtained. Time Out: Verified patient identification, verified procedure, site/side was marked, verified correct patient position, special equipment/implants available, medications/allergies/relevent history reviewed, required imaging and test results available.  Performed  Patient placed on cardiac monitor, pulse oximetry, supplemental oxygen as necessary.  Sedation given: Propofol per anesthesiology Pacer pads placed anterior and posterior chest.  Cardioverted 2 time(s).  Cardioverted at [place].  Evaluation Findings: Post procedure EKG shows: NSR with PACs Complications: None Patient did tolerate procedure well.   Loralie Champagne 11/25/2020, 8:53 AM

## 2020-11-25 NOTE — Interval H&P Note (Signed)
History and Physical Interval Note:  11/25/2020 8:45 AM  Ricardo Valencia  has presented today for surgery, with the diagnosis of AFLUTTER.  The various methods of treatment have been discussed with the patient and family. After consideration of risks, benefits and other options for treatment, the patient has consented to  Procedure(s): CARDIOVERSION (N/A) as a surgical intervention.  The patient's history has been reviewed, patient examined, no change in status, stable for surgery.  I have reviewed the patient's chart and labs.  Questions were answered to the patient's satisfaction.     Tatiana Courter [employer]

## 2020-11-26 ENCOUNTER — Other Ambulatory Visit: Payer: Self-pay | Admitting: Cardiology

## 2020-11-26 DIAGNOSIS — I480 Paroxysmal atrial fibrillation: Secondary | ICD-10-CM

## 2020-11-28 ENCOUNTER — Encounter (HOSPITAL_COMMUNITY): Payer: Self-pay | Admitting: Cardiology

## 2020-12-02 ENCOUNTER — Telehealth: Payer: Self-pay | Admitting: Family Medicine

## 2020-12-02 NOTE — Telephone Encounter (Signed)
I spoke with pt's wife. Appointment scheduled for Tuesday at 2:30.

## 2020-12-02 NOTE — Telephone Encounter (Signed)
His shingles pain has been chronic.  Set up follow up early next week (30 minutes) and we can discuss other options.   I think he is already on Gabapentin.

## 2020-12-02 NOTE — Telephone Encounter (Signed)
Patient wife is calling and stated that he is experiencing pain from having shingles and wanted to know if there was anything else he can take, please advise. CB is [phone]

## 2020-12-04 ENCOUNTER — Other Ambulatory Visit: Payer: Self-pay | Admitting: Cardiology

## 2020-12-04 DIAGNOSIS — I480 Paroxysmal atrial fibrillation: Secondary | ICD-10-CM

## 2020-12-05 ENCOUNTER — Other Ambulatory Visit: Payer: Self-pay

## 2020-12-06 ENCOUNTER — Encounter: Payer: Self-pay | Admitting: Family Medicine

## 2020-12-06 ENCOUNTER — Ambulatory Visit (INDEPENDENT_AMBULATORY_CARE_PROVIDER_SITE_OTHER): Payer: Medicare Other | Admitting: Family Medicine

## 2020-12-06 VITALS — BP 110/60 | HR 85 | Ht 67.0 in | Wt 167.5 lb

## 2020-12-06 DIAGNOSIS — R519 Headache, unspecified: Secondary | ICD-10-CM

## 2020-12-06 DIAGNOSIS — L989 Disorder of the skin and subcutaneous tissue, unspecified: Secondary | ICD-10-CM | POA: Diagnosis not present

## 2020-12-06 DIAGNOSIS — B0229 Other postherpetic nervous system involvement: Secondary | ICD-10-CM | POA: Diagnosis not present

## 2020-12-06 DIAGNOSIS — I2510 Atherosclerotic heart disease of native coronary artery without angina pectoris: Secondary | ICD-10-CM

## 2020-12-06 MED ORDER — DULOXETINE HCL 30 MG PO CPEP: 30.0000 mg | ORAL_CAPSULE | Freq: Every day | ORAL | 3 refills | Status: DC

## 2020-12-06 NOTE — Progress Notes (Signed)
Established Patient Office Visit  Subjective:  Patient ID: Ricardo Valencia, male    DOB: 04-Mar-1929  Age: 87 y.o. MRN: [id number]  CC:  Chief Complaint  Patient presents with  . Follow-up    HPI  [name] presents for discussion regarding ongoing right-sided facial pain.  He has history of severe case of shingles back in 2017 and had severe postherpetic neuralgia- which has persisted to this day.  He currently takes gabapentin and is taking high-dose of 900 mg at night.  He apparently has refused to take during the day.  There was some concern regarding daytime sedation (when taking the Gabapentin during day).  His pain is fairly well controlled at night.  He has burning type quality of pain which tends to be worse at night and possibly worse with talking and chewing.  Current daytime pain is rated 6-7 out of 10.  They have not noted any adenopathy or any facial or neck masses.  He does have history of skin cancers and we have urged him to follow-up with dermatology multiple times but he apparently has refused.  He has several concerning lesions on his face and neck region.  He has seen [facility] dermatology in the past.  Chronic problems include history of CAD, chronic systolic heart failure, atrial fibrillation, hypothyroidism, essential tremor  Past Medical History:  Diagnosis Date  . CAD (coronary artery disease)    DES to mid LAD 11/10/2014  . Chronic systolic heart failure ([place])   . Hay fever    hay fever, allergies  . History of echocardiogram    Echo 3/17: EF 40%, diff HK, trivial AI, trivial MR, mod LAE, mild reduced RVSF, mod RAE  . Hyperlipidemia   . Hypertension   . Persistent atrial fibrillation [facility])     Past Surgical History:  Procedure Laterality Date  . APPENDECTOMY  1947  . CARDIOVERSION N/A 12/30/2014   Procedure: CARDIOVERSION;  Surgeon: Jerline Pain, MD;  Location: [place];  Service: Cardiovascular;  Laterality: N/A;  . CARDIOVERSION N/A  11/25/2020   Procedure: CARDIOVERSION;  Surgeon: Larey Dresser, MD;  Location: [place];  Service: Cardiovascular;  Laterality: N/A;  . CORONARY ANGIOPLASTY WITH STENT PLACEMENT    . LEFT HEART CATHETERIZATION WITH CORONARY ANGIOGRAM N/A 11/10/2014   Procedure: LEFT HEART CATHETERIZATION WITH CORONARY ANGIOGRAM;  Surgeon: Wellington Hampshire, MD;  Location: [place] CATH LAB;  Service: Cardiovascular;  Laterality: N/A;  . PERCUTANEOUS CORONARY STENT INTERVENTION (PCI-S)  11/10/2014   Procedure: PERCUTANEOUS CORONARY STENT INTERVENTION (PCI-S);  Surgeon: Wellington Hampshire, MD;  Location: [facility] CATH LAB;  Service: Cardiovascular;;    Family History  Problem Relation Age of Onset  . Cancer Mother        breast  . Cancer Father        colon    Social History   Socioeconomic History  . Marital status: Married    Spouse name: Not on file  . Number of children: Not on file  . Years of education: Not on file  . Highest education level: Not on file  Occupational History  . Occupation: retired  Tobacco Use  . Smoking status: Never Smoker  . Smokeless tobacco: Never Used  . Tobacco comment: "smoked when I was 22"  Vaping Use  . Vaping Use: Never used  Substance and Sexual Activity  . Alcohol use: Yes    Alcohol/week: 7.0 - 14.0 standard drinks    Types: 7 - 14 Cans of beer  per week    Comment: occ  . Drug use: No  . Sexual activity: Not Currently  Other Topics Concern  . Not on file  Social History Narrative  . Not on file   Social Determinants of Health   Financial Resource Strain: Low Risk   . Difficulty of Paying Living Expenses: Not hard at all  Food Insecurity: No Food Insecurity  . Worried About Charity fundraiser in the Last Year: Never true  . Ran Out of Food in the Last Year: Never true  Transportation Needs: No Transportation Needs  . Lack of Transportation (Medical): No  . Lack of Transportation (Non-Medical): No  Physical Activity: Sufficiently Active  . Days of  Exercise per Week: 5 days  . Minutes of Exercise per Session: 30 min  Stress: Stress Concern Present  . Feeling of Stress : To some extent  Social Connections: Moderately Isolated  . Frequency of Communication with Friends and Family: More than three times a week  . Frequency of Social Gatherings with Friends and Family: Three times a week  . Attends Religious Services: Never  . Active Member of Clubs or Organizations: No  . Attends Archivist Meetings: Never  . Marital Status: Married  Human resources officer Violence: Not At Risk  . Fear of Current or Ex-Partner: No  . Emotionally Abused: No  . Physically Abused: No  . Sexually Abused: No    Outpatient Medications Prior to Visit  Medication Sig Dispense Refill  . amiodarone (PACERONE) 200 MG tablet Take 100 mg by mouth daily at 12 noon.    . Ascorbic Acid (VITAMIN C) 1000 MG tablet Take 1,000 mg by mouth daily.    Marland Kitchen atorvastatin (LIPITOR) 80 MG tablet TAKE 1 TABLET BY MOUTH ONCE DAILY AT  6PM (Patient taking differently: Take 80 mg by mouth at bedtime.) 90 tablet 3  . calcium carbonate (TUMS - DOSED IN MG ELEMENTAL CALCIUM) 500 MG chewable tablet Chew 500 mg by mouth daily.    . furosemide (LASIX) 40 MG tablet Take 1 tablet (40 mg total) by mouth daily. (Patient taking differently: Take 60 mg by mouth daily.) 135 tablet 1  . gabapentin (NEURONTIN) 300 MG capsule Take 1 capsule (300 mg total) by mouth 3 (three) times daily. (Patient taking differently: Take 900 mg by mouth at bedtime.) 90 capsule 5  . levothyroxine (SYNTHROID) 25 MCG tablet TAKE 1 TABLET BY MOUTH ONCE DAILY BEFORE BREAKFAST . APPOINTMENT REQUIRED FOR FUTURE REFILLS (Patient taking differently: Take 25 mcg by mouth daily before breakfast.) 90 tablet 0  . lisinopril (ZESTRIL) 2.5 MG tablet Take 1 tablet by mouth once daily (Patient taking differently: Take 2.5 mg by mouth daily.) 90 tablet 1  . nitroGLYCERIN (NITROSTAT) 0.4 MG SL tablet Place 1 tablet (0.4 mg total)  under the tongue every 5 (five) minutes x 3 doses as needed for chest pain. 25 tablet 2  . potassium chloride (KLOR-CON) 10 MEQ tablet Take 1 tablet (10 mEq total) by mouth daily. (Patient taking differently: Take 10 mEq by mouth at bedtime.) 90 tablet 3  . primidone (MYSOLINE) 50 MG tablet Take 1 tablet (50 mg total) by mouth 2 (two) times daily. Take at 10am and 6pm 60 tablet 6  . triamcinolone (KENALOG) 0.1 % Apply 1 application topically 2 (two) times daily. (Patient taking differently: Apply 1 application topically daily as needed (Rash).) 453.6 g 1  . warfarin (COUMADIN) 5 MG tablet TAKE AS DIRECTED BY COUMADIN CLINIC 40 tablet 1  No facility-administered medications prior to visit.    No Known Allergies  ROS Review of Systems  Constitutional: Negative for appetite change, chills and fever.       Has had some documented weight loss over past few weeks.   Respiratory: Negative for shortness of breath.   Cardiovascular: Negative for chest pain.  Gastrointestinal: Negative for abdominal pain.  Neurological: Negative for headaches.      Objective:    Physical Exam Cardiovascular:     Rate and Rhythm: Normal rate.  Pulmonary:     Effort: Pulmonary effort is normal.     Breath sounds: Normal breath sounds.  Skin:    Comments: He has a large skin lesion left forehead which is probably centimeter and a half slightly raised nodular periphery and somewhat scabbed center.  He has approximately 8 to 9 mm nodular lesion with excoriated surface left cheek.  Just below the left ear he has fairly large 1-1/2 x 1-1/2 cm area of induration  Neurological:     Mental Status: He is alert.     BP 110/60   Pulse 85   Ht 5\' 7"  (2.297 m)   Wt 167 lb 8 oz (76 kg)   SpO2 98%   BMI 26.23 kg/m  Wt Readings from Last 3 Encounters:  12/06/20 167 lb 8 oz (76 kg)  11/25/20 170 lb (77.1 kg)  11/18/20 178 lb 6.4 oz (80.9 kg)     Health Maintenance Due  Topic Date Due  . COVID-19 Vaccine (3  - Pfizer risk 4-dose series) 08/04/2020    There are no preventive care reminders to display for this patient.  Lab Results  Component Value Date   TSH 8.125 (H) 11/02/2020   Lab Results  Component Value Date   WBC 5.7 08/31/2020   HGB 13.2 08/31/2020   HCT 39.9 08/31/2020   MCV 89.9 08/31/2020   PLT 142 08/31/2020   Lab Results  Component Value Date   NA 138 11/22/2020   K 4.2 11/22/2020   CO2 28 11/22/2020   GLUCOSE 110 (H) 11/22/2020   BUN 18 11/22/2020   CREATININE 2.13 (H) 11/22/2020   BILITOT 1.0 11/02/2020   ALKPHOS 65 11/02/2020   AST 24 11/02/2020   ALT 12 11/02/2020   PROT 6.7 11/02/2020   ALBUMIN 3.5 11/02/2020   CALCIUM 8.1 (L) 11/22/2020   ANIONGAP 7 11/22/2020   GFR 66.35 10/20/2015   Lab Results  Component Value Date   CHOL 180 08/31/2020   Lab Results  Component Value Date   HDL 54 08/31/2020   Lab Results  Component Value Date   LDLCALC 107 (H) 08/31/2020   Lab Results  Component Value Date   TRIG 97 08/31/2020   Lab Results  Component Value Date   CHOLHDL 3.3 08/31/2020   Lab Results  Component Value Date   HGBA1C 5.4 11/07/2014      Assessment & Plan:   #1 ongoing right facial and neck pain.  He does have history of severe case of shingles 2017 has had some pain since then.  Question in their minds is whether this is all related to shingles.  He does not have any worrisome findings such as neck adenopathy.  They have concerns about daytime use of gabapentin because of dizziness and sedation  -We did discuss possible use of low-dose Cymbalta 30 mg once daily for daytime use and continue his nighttime use of gabapentin -Set up 1 month follow-up to reassess and consider further titration  then if tolerating well if not better controlled with daytime pain  #2 multiple skin lesions concerning for skin cancer.  Probably has several basal cells and/or squamous cells of the head neck  -Strongly recommend getting back into [facility]  dermatology and will set up follow-up with them whom he is seen in the past  Meds ordered this encounter  Medications  . DULoxetine (CYMBALTA) 30 MG capsule    Sig: Take 1 capsule (30 mg total) by mouth daily.    Dispense:  30 capsule    Refill:  3    Follow-up: Return in about 1 month (around 01/06/2021).    Carolann Littler, MD

## 2020-12-06 NOTE — Patient Instructions (Signed)
Set up one month follow up  I am setting up follow up with dermatology.

## 2020-12-09 NOTE — Addendum Note (Signed)
Addended by: Rodrigo Ran on: 12/09/2020 08:05 AM   Modules accepted: Orders

## 2020-12-14 ENCOUNTER — Telehealth: Payer: Self-pay | Admitting: Family Medicine

## 2020-12-14 ENCOUNTER — Ambulatory Visit (INDEPENDENT_AMBULATORY_CARE_PROVIDER_SITE_OTHER): Payer: Medicare Other | Admitting: *Deleted

## 2020-12-14 ENCOUNTER — Other Ambulatory Visit: Payer: Self-pay

## 2020-12-14 DIAGNOSIS — I480 Paroxysmal atrial fibrillation: Secondary | ICD-10-CM | POA: Diagnosis not present

## 2020-12-14 LAB — POCT INR: INR: 3 (ref 2.0–3.0)

## 2020-12-14 NOTE — Patient Instructions (Signed)
Description   Instructed pt to continue taking Warfarin 1 tablet daily except 1.5 tablets on Sundays and Fridays. Eat an extra serving of greens today. Recheck INR in 5 weeks. Call us with any medication changes or concern [phone] [facility], Main # [phone].

## 2020-12-14 NOTE — Telephone Encounter (Signed)
FYI:  Pts spouse is calling in stating on Monday and Tuesday pt was lightheaded (with it lasting 4-5 minutes) and pt was acting as if he was going to faint spouse was able to assist pt to a chair and after getting there he seemed to be okay.  Pt went to have his INR checked they told the spouse if it happens again she to call the EMS for assistance with the pt b/c they can do a EKG on him to see what is going on.  Pt declined to have EMS and to go to the ER.  Spouse declined to speak with Triage Nurse.

## 2020-12-14 NOTE — Telephone Encounter (Signed)
Sent to Dr. [name] as an Juluis Rainier

## 2020-12-15 ENCOUNTER — Encounter (HOSPITAL_COMMUNITY): Payer: Self-pay

## 2020-12-15 ENCOUNTER — Ambulatory Visit (HOSPITAL_COMMUNITY)
Admission: RE | Admit: 2020-12-15 | Discharge: 2020-12-15 | Disposition: A | Discharge status: Home / Self Care | Payer: Medicare Other | Source: Ambulatory Visit | Attending: Cardiology | Admitting: Cardiology

## 2020-12-15 VITALS — BP 110/70 | HR 72 | Wt 167.6 lb

## 2020-12-15 DIAGNOSIS — I5022 Chronic systolic (congestive) heart failure: Secondary | ICD-10-CM | POA: Insufficient documentation

## 2020-12-15 DIAGNOSIS — I4892 Unspecified atrial flutter: Secondary | ICD-10-CM | POA: Diagnosis not present

## 2020-12-15 DIAGNOSIS — R42 Dizziness and giddiness: Secondary | ICD-10-CM | POA: Insufficient documentation

## 2020-12-15 DIAGNOSIS — I951 Orthostatic hypotension: Secondary | ICD-10-CM | POA: Diagnosis not present

## 2020-12-15 DIAGNOSIS — I5032 Chronic diastolic (congestive) heart failure: Secondary | ICD-10-CM

## 2020-12-15 DIAGNOSIS — I2510 Atherosclerotic heart disease of native coronary artery without angina pectoris: Secondary | ICD-10-CM | POA: Diagnosis not present

## 2020-12-15 DIAGNOSIS — I4819 Other persistent atrial fibrillation: Secondary | ICD-10-CM | POA: Insufficient documentation

## 2020-12-15 DIAGNOSIS — R296 Repeated falls: Secondary | ICD-10-CM | POA: Insufficient documentation

## 2020-12-15 DIAGNOSIS — Z79899 Other long term (current) drug therapy: Secondary | ICD-10-CM | POA: Diagnosis not present

## 2020-12-15 DIAGNOSIS — Z955 Presence of coronary angioplasty implant and graft: Secondary | ICD-10-CM | POA: Insufficient documentation

## 2020-12-15 DIAGNOSIS — I130 Hypertensive heart and chronic kidney disease with heart failure and stage 1 through stage 4 chronic kidney disease, or unspecified chronic kidney disease: Secondary | ICD-10-CM | POA: Diagnosis not present

## 2020-12-15 DIAGNOSIS — N183 Chronic kidney disease, stage 3 unspecified: Secondary | ICD-10-CM | POA: Insufficient documentation

## 2020-12-15 DIAGNOSIS — Z7901 Long term (current) use of anticoagulants: Secondary | ICD-10-CM | POA: Diagnosis not present

## 2020-12-15 DIAGNOSIS — I483 Typical atrial flutter: Secondary | ICD-10-CM

## 2020-12-15 LAB — CBC
HCT: 36.8 % — ABNORMAL LOW (ref 39.0–52.0)
Hemoglobin: 12.4 g/dL — ABNORMAL LOW (ref 13.0–17.0)
MCH: 31.6 pg (ref 26.0–34.0)
MCHC: 33.7 g/dL (ref 30.0–36.0)
MCV: 93.9 fL (ref 80.0–100.0)
Platelets: 147 10*3/uL — ABNORMAL LOW (ref 150–400)
RBC: 3.92 MIL/uL — ABNORMAL LOW (ref 4.22–5.81)
RDW: 14.8 % (ref 11.5–15.5)
WBC: 5.6 10*3/uL (ref 4.0–10.5)
nRBC: 0 % (ref 0.0–0.2)

## 2020-12-15 LAB — BASIC METABOLIC PANEL
Anion gap: 6 (ref 5–15)
BUN: 23 mg/dL (ref 8–23)
CO2: 28 mmol/L (ref 22–32)
Calcium: 7.8 mg/dL — ABNORMAL LOW (ref 8.9–10.3)
Chloride: 101 mmol/L (ref 98–111)
Creatinine, Ser: 2.04 mg/dL — ABNORMAL HIGH (ref 0.61–1.24)
GFR, Estimated: 31 mL/min — ABNORMAL LOW (ref >60)
Glucose, Bld: 77 mg/dL (ref 70–99)
Potassium: 3.8 mmol/L (ref 3.5–5.1)
Sodium: 135 mmol/L (ref 135–145)

## 2020-12-15 NOTE — Progress Notes (Signed)
Advanced Heart Failure Progress Note    PCP: Dr. [name] Cardiology: Dr. Angelena Form HF Cardiology: Dr. Aundra Dubin  87 y.o. with history of persistent atrial fibrillation, CAD, and chronic systolic CHF returns for followup of CHF and atrial fibrillation.  Patient was admitted in 2/16 with afib/RVR.  Echo showed EF 25-30% at that time.  He had LHC, showing 90% mLAD stenosis that was treated with DES. In 4/16, he was cardioverted back to NSR.  Repeat echo in NSR showed EF 45-50%.  He was then admitted in 1/17 and again in 3/17 with afib/RVR (recurrent).  He did not have repeat DCCV. Echo in 3/17 showed EF 40% with mildly decreased RV systolic function.  He has remained in atrial fibrillation.   Last echo in 10/18 showed EF 55-60% with basal inferior akinesis, mildly dilated RV with mildly decreased RV systolic function.    He was admitted for Tikosyn initiation and converted to NSR, but Tikosyn had to be stopped because of prolonged QT interval. Amiodarone was started after; he is still on amiodarone today.   Patient returned for followup of CHF and atrial fibrillation 11/2017.  He was in sinus bradycardia. Toprol was decreased to 50 mg due to SB.  Started having falls 6/21 and sinus bradycardia. Toprol was stopped and amiodarone decreased to 100 mg daily.   Had clinic f/u 2/22 and he was in atrial flutter, asymptomatic. Amio increased to 200 mg bid, with hopes of chemically converting. Didn't happen. Required DCCV 11/26/19. Converted to NSR w/ PACs.   Of note, he was also fluid overloaded at his last clinic visit. ReDs clip was elevated at 40%. Lasix was increased to 80 mg daily x 3 days, then back to 60 mg daily.  Presents to clinic today for f/u. Volume status improved. ReDs Clip 30%. Wt is down 11 lb from prior visit, 178>>167 lb. BP is well controlled, 110/70 but had had some recent orthostatic symptoms but no falls. Unfortunately, he is back in atrial flutter but rate controlled in the 70s.  He denies dyspnea and fatigue. His PCP checked his INR yesterday and it was therapeutic at 3.0.   Orthostatic Vital Signs  Laying 120/98 Sitting 106/62 Standing 82/52     ECG (personally reviewed): atrial flutter 70 bpm   Labs (5/17): LDL 64, HDL 66 Labs (7/18): K 4.3, creatinine 1.42 Labs (9/18): K 4.5, creatinine 1.46, BNP 501  Labs (11/18): K 4, creatinine 1.43, tbili 1.4, AST/ALT normal, TSH elevated but free T3 and free T4 normal.  Labs (1/19): LDL 93, HDL 63, hgb 13.5, TSH 9, free T3 and T4 normal, LFTs normal, K 4.2, creatinine 1.57 Labs (12/21): K 4.6, creatinine 2.02, LDL 107, HDL 54, hgb 13.2, TSH 9.4 (Synthroid increased by PCP Labs (2/22): K 4.5, creatinine 2.25 Labs (3/22): K 4.2, creatinine 2.13 INR 3.0    PMH: 1. HTN 2. CKD stage 3 3. Hyperlipidemia 4. CAD:  - LHC 2/16 with 90% mLAD, 50% RCA => DES to mLAD.  5. 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  - Echo (2/16) with EF 25-30%, moderate MR while in afib.  - Echo (4/16) in NSR with EF 45-50%, no MR.  - Echo (3/17) in atrial fibrillation with EF 40%, mildly decreased RV systolic function.  - Echo (10/18): EF 55-60%, basal inferior akinesis, mild LVH, mildly dilated RV with mildly decreased systolic function, mild MR. 6. Atrial fibrillation: Persistent.  - 2/16 admission for afib/RVR => DCCV in 4/16.  -  1/17 admission with afib/RVR - 3/17 admission with afib/RVR 7. PFTs (11/18): moderate obstructive airways disease.   Social History   Socioeconomic History  . Marital status: Married    Spouse name: Not on file  . Number of children: Not on file  . Years of education: Not on file  . Highest education level: Not on file  Occupational History  . Occupation: retired  Tobacco Use  . Smoking status: Never Smoker  . Smokeless tobacco: Never Used  . Tobacco comment: "smoked when I was 22"  Vaping Use  . Vaping Use: Never used  Substance and Sexual Activity  . Alcohol  use: Yes    Alcohol/week: 7.0 - 14.0 standard drinks    Types: 7 - 14 Cans of beer per week    Comment: occ  . Drug use: No  . Sexual activity: Not Currently  Other Topics Concern  . Not on file  Social History Narrative  . Not on file   Social Determinants of Health   Financial Resource Strain: Low Risk   . Difficulty of Paying Living Expenses: Not hard at all  Food Insecurity: No Food Insecurity  . Worried About Charity fundraiser in the Last Year: Never true  . Ran Out of Food in the Last Year: Never true  Transportation Needs: No Transportation Needs  . Lack of Transportation (Medical): No  . Lack of Transportation (Non-Medical): No  Physical Activity: Sufficiently Active  . Days of Exercise per Week: 5 days  . Minutes of Exercise per Session: 30 min  Stress: Stress Concern Present  . Feeling of Stress : To some extent  Social Connections: Moderately Isolated  . Frequency of Communication with Friends and Family: More than three times a week  . Frequency of Social Gatherings with Friends and Family: Three times a week  . Attends Religious Services: Never  . Active Member of Clubs or Organizations: No  . Attends Archivist Meetings: Never  . Marital Status: Married  Human resources officer Violence: Not At Risk  . Fear of Current or Ex-Partner: No  . Emotionally Abused: No  . Physically Abused: No  . Sexually Abused: No   Family History  Problem Relation Age of Onset  . Cancer Mother        breast  . Cancer Father        colon   ROS: All systems reviewed and negative except as per HPI.   Current Outpatient Medications  Medication Sig Dispense Refill  . amiodarone (PACERONE) 200 MG tablet Take 100 mg by mouth daily at 12 noon.    . Ascorbic Acid (VITAMIN C) 1000 MG tablet Take 1,000 mg by mouth daily.    Marland Kitchen atorvastatin (LIPITOR) 80 MG tablet Take 80 mg by mouth at bedtime.    . calcium carbonate (TUMS - DOSED IN MG ELEMENTAL CALCIUM) 500 MG chewable tablet  Chew 500 mg by mouth daily.    . DULoxetine (CYMBALTA) 30 MG capsule Take 1 capsule (30 mg total) by mouth daily. 30 capsule 3  . furosemide (LASIX) 40 MG tablet Take 60 mg by mouth daily.    Marland Kitchen gabapentin (NEURONTIN) 300 MG capsule Take 900 mg by mouth at bedtime.    Marland Kitchen levothyroxine (SYNTHROID) 25 MCG tablet Take 25 mcg by mouth daily before breakfast.    . nitroGLYCERIN (NITROSTAT) 0.4 MG SL tablet Place 1 tablet (0.4 mg total) under the tongue every 5 (five) minutes x 3 doses as needed for chest pain.  25 tablet 2  . potassium chloride (KLOR-CON) 10 MEQ tablet Take 10 mEq by mouth at bedtime.    . primidone (MYSOLINE) 50 MG tablet Take 50 mg by mouth daily.    Marland Kitchen triamcinolone (KENALOG) 0.1 % Apply 1 application topically 2 (two) times daily. (Patient taking differently: Apply 1 application topically daily as needed (Rash).) 453.6 g 1  . warfarin (COUMADIN) 5 MG tablet TAKE AS DIRECTED BY COUMADIN CLINIC 40 tablet 1   No current facility-administered medications for this encounter.   Wt Readings from Last 3 Encounters:  12/15/20 76 kg (167 lb 9.6 oz)  12/06/20 76 kg (167 lb 8 oz)  11/25/20 77.1 kg (170 lb)   BP 110/70   Pulse 72   Wt 76 kg (167 lb 9.6 oz)   SpO2 98%   BMI 26.25 kg/m  PHYSICAL EXAM: ReDs Clip 30%  General:  Well appearing elderly WM. No respiratory difficulty HEENT: normal Neck: supple. no JVD. Carotids 2+ bilat; no bruits. No lymphadenopathy or thyromegaly appreciated. Cor: PMI nondisplaced. Irregularly irregular rhythm. No rubs, gallops or murmurs. Lungs: clear Abdomen: soft, nontender, nondistended. No hepatosplenomegaly. No bruits or masses. Good bowel sounds. Extremities: no cyanosis, clubbing, rash, edema Neuro: alert & oriented x 3, cranial nerves grossly intact. moves all 4 extremities w/o difficulty. Affect pleasant.   Assessment/Plan: 1. 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  EF has  fallen when he has been in atrial fibrillation. However, echo in 3/20 showed EF up to 55-60%.  NYHA III symptoms, likely due to general inactivity and deconditioning.  - Volume status good. Euvolemic on exam. Wt down 11 lb from last visit. ReDs Clip 30% - Continue Lasix 60 mg daily  - His Toprol was stopped 6/21 due to SB and frequent falls w/ rib fractures. - Stop lisinopril w/ low BP  - Check BMP today  2. Atrial fibrillation/ AFlutter: Paroxysmal.  He was unable to continue Tikosyn due to prolonged QT interval. In Atrial Flutter 2/22>>underwent DCCV 11/25/20 to NSR. Back in Aflutter on EKG today, HR controlled in the 70s. Asymptomatic. - We discussed reattempt at DCCV vs rate control strategy. Given lack of symptoms, he opts to continue w/ rate control. Continue amiodarone 200 mg daily. He will notify us if development of symptoms.  - Continue warfarin.  INRs followed by [facility] Coumad[facility] clinic (INR therapeutic yesterday at 3.0) 3. CKD: Stage 3.  Most recent SCr 2.1 - check BMP today  4. CAD: DES to LAD in 2/16.  Denies CP  - No ASA given stable CAD and warfarin use.  - Continue atorvastatin 80 mg daily. 5. Suspect OSA: Snoring. Sleep study, pending. 6. Orthostatic Hypotension: reports dizziness w/ positional changes. Orthostatic VS checked in clinic today and + (see above in HPI) - Stop lisinopril  - check CBC and BMP - advised to use caution w/ positional changes and add TED hoses   RN visit in 7-10 days to recheck orthostatics. F/u again w/ APP in 4-6 weeks.    Lyda Jester  PA-C  12/15/2020

## 2020-12-15 NOTE — Patient Instructions (Signed)
Labs done today. We will contact you only if your labs are abnormal.  STOP taking Lisinopril   No other medication changes were made. Please continue all current medications as prescribed.  Your physician recommends that you schedule a follow-up appointment in: 1 week for a nurse only appointment and in 4-6 weeks with our PA/NP Clinic here in our office.   If you have any questions or concerns before your next appointment please send [place] a message through [place] or call our office at [phone].    TO LEAVE A MESSAGE FOR THE NURSE SELECT OPTION 2, PLEASE LEAVE A MESSAGE INCLUDING: . YOUR NAME . DATE OF BIRTH . CALL BACK NUMBER . REASON FOR CALL**this is important as we prioritize the call backs  YOU WILL RECEIVE A CALL BACK THE SAME DAY AS LONG AS YOU CALL BEFORE 4:00 PM   Do the following things EVERYDAY: 1) Weigh yourself in the morning before breakfast. Write it down and keep it in a log. 2) Take your medicines as prescribed 3) Eat low salt foods--Limit salt (sodium) to 2000 mg per day.  4) Stay as active as you can everyday 5) Limit all fluids for the day to less than 2 liters   At the [facility], you and your health needs are our priority. As part of our continuing mission to provide you with exceptional heart care, we have created designated Provider Care Teams. These Care Teams include your primary Cardiologist (physician) and Advanced Practice Providers (APPs- Physician Assistants and Nurse Practitioners) who all work together to provide you with the care you need, when you need it.   You may see any of the following providers on your designated Care Team at your next follow up: Marland Kitchen Dr Glori Bickers . Dr Loralie Champagne . Darrick Grinder, NP . Lyda Jester, PA . Audry Riles, PharmD   Please be sure to bring in all your medications bottles to every appointment.

## 2020-12-15 NOTE — Progress Notes (Signed)
ReDS Vest / Clip - 12/15/20 1000      ReDS Vest / Clip   Station Marker C    Ruler Value 31    ReDS Value Range Low volume    ReDS Actual Value 30    Anatomical Comments sitting

## 2020-12-21 ENCOUNTER — Ambulatory Visit (HOSPITAL_COMMUNITY)
Admission: RE | Admit: 2020-12-21 | Discharge: 2020-12-21 | Disposition: A | Discharge status: Home / Self Care | Payer: Medicare Other | Source: Ambulatory Visit | Attending: Internal Medicine | Admitting: Internal Medicine

## 2020-12-21 ENCOUNTER — Other Ambulatory Visit: Payer: Self-pay

## 2020-12-21 DIAGNOSIS — R42 Dizziness and giddiness: Secondary | ICD-10-CM

## 2020-12-21 DIAGNOSIS — I5032 Chronic diastolic (congestive) heart failure: Secondary | ICD-10-CM | POA: Diagnosis not present

## 2020-12-21 DIAGNOSIS — I951 Orthostatic hypotension: Secondary | ICD-10-CM | POA: Insufficient documentation

## 2020-12-21 MED ORDER — MIDODRINE HCL 2.5 MG PO TABS: 2.5000 mg | ORAL_TABLET | Freq: Three times a day (TID) | ORAL | 2 refills | Status: DC

## 2020-12-21 NOTE — Patient Instructions (Signed)
START Midodrine 2.5 mg one tab three times daily  Keep follow up as scheduled  Do the following things EVERYDAY: 1) Weigh yourself in the morning before breakfast. Write it down and keep it in a log. 2) Take your medicines as prescribed 3) Eat low salt foods--Limit salt (sodium) to 2000 mg per day.  4) Stay as active as you can everyday 5) Limit all fluids for the day to less than 2 liters   At the [facility], you and your health needs are our priority. As part of our continuing mission to provide you with exceptional heart care, we have created designated Provider Care Teams. These Care Teams include your primary Cardiologist (physician) and Advanced Practice Providers (APPs- Physician Assistants and Nurse Practitioners) who all work together to provide you with the care you need, when you need it.   You may see any of the following providers on your designated Care Team at your next follow up: Marland Kitchen Dr Glori Bickers . Dr Loralie Champagne . Dr Vickki Muff . Darrick Grinder, NP . Lyda Jester, [place] . Audry Riles, PharmD   Please be sure to bring in all your medications bottles to every appointment.

## 2020-12-21 NOTE — Progress Notes (Signed)
B/p sitting 118/70 B/p standing 102/64   Patient reports he feels fine Wife reports one episode a few days ago where pt was extremely dizzy SBP 96. Was able to get into the chair and rest for remainder of the day. Felt sluggish the following stay so he stayed in the bed.  Above reviewed with Lyda Jester PA Per VO start midodrine 2.5 mg one tab tid  Keep follow up

## 2020-12-28 ENCOUNTER — Other Ambulatory Visit: Payer: Self-pay | Admitting: Family Medicine

## 2020-12-28 NOTE — Telephone Encounter (Signed)
Please advise. Last filled by a historical provider.

## 2021-01-01 NOTE — Telephone Encounter (Signed)
Give one refill.   He has follow up this week and needs repeat TSH then.   Last TSH was > 8 back in February.

## 2021-01-02 ENCOUNTER — Other Ambulatory Visit: Payer: Self-pay

## 2021-01-02 DIAGNOSIS — E032 Hypothyroidism due to medicaments and other exogenous substances: Secondary | ICD-10-CM

## 2021-01-06 ENCOUNTER — Ambulatory Visit: Payer: Medicare Other | Admitting: Family Medicine

## 2021-01-10 ENCOUNTER — Ambulatory Visit (INDEPENDENT_AMBULATORY_CARE_PROVIDER_SITE_OTHER): Payer: Medicare Other | Admitting: Family Medicine

## 2021-01-10 ENCOUNTER — Other Ambulatory Visit: Payer: Self-pay

## 2021-01-10 ENCOUNTER — Encounter: Payer: Self-pay | Admitting: Family Medicine

## 2021-01-10 VITALS — BP 124/62 | HR 81 | Temp 98.2°F | Wt 171.7 lb

## 2021-01-10 DIAGNOSIS — B0229 Other postherpetic nervous system involvement: Secondary | ICD-10-CM

## 2021-01-10 DIAGNOSIS — I2510 Atherosclerotic heart disease of native coronary artery without angina pectoris: Secondary | ICD-10-CM

## 2021-01-10 DIAGNOSIS — E032 Hypothyroidism due to medicaments and other exogenous substances: Secondary | ICD-10-CM

## 2021-01-10 MED ORDER — DULOXETINE HCL 60 MG PO CPEP: 60.0000 mg | ORAL_CAPSULE | Freq: Every day | ORAL | 3 refills | Status: DC

## 2021-01-10 NOTE — Patient Instructions (Signed)
Go ahead and increase the Cymbalta to  60 mg once daily  Go to lab to recheck thyroid.

## 2021-01-10 NOTE — Progress Notes (Signed)
Established Patient Office Visit  Subjective:  Patient ID: Ricardo Valencia, male    DOB: 07/26/1929  Age: 87 y.o. MRN: [id number]  CC:  Chief Complaint  Patient presents with  . Follow-up    HPI Ricardo Valencia presents for follow-up regarding his severe postherpetic neuralgia involving right side of face and neck.  He has been battling this now for a few years.  He is on high-dose gabapentin at night.  He had problems with sedation limiting daytime use.  He does feel like the gabapentin helps at night.  We recently started Cymbalta 30 mg daily and he and wife thinks this may be helping some.  Still has significant daytime pain.  Our goal was to consider titrating up to 60 mg at this time if tolerating well.  Did not see any side effects.  He does have hypothyroidism and recently started on low-dose levothyroxine 25 mcg daily.  Needs follow-up TSH at this time.  Wife helps to oversee his medications as he has some cognitive issues.  Multiple chronic medical problems as listed below.  He has multiple skin cancers and we got him to agree to see dermatology again and they have pending appointment for May.  Past Medical History:  Diagnosis Date  . CAD (coronary artery disease)    DES to mid LAD 11/10/2014  . Chronic systolic heart failure ([place])   . Hay fever    hay fever, allergies  . History of echocardiogram    Echo 3/17: EF 40%, diff HK, trivial AI, trivial MR, mod LAE, mild reduced RVSF, mod RAE  . Hyperlipidemia   . Hypertension   . Persistent atrial fibrillation [facility])     Past Surgical History:  Procedure Laterality Date  . APPENDECTOMY  1947  . CARDIOVERSION N/A 12/30/2014   Procedure: CARDIOVERSION;  Surgeon: Jerline Pain, MD;  Location: [place];  Service: Cardiovascular;  Laterality: N/A;  . CARDIOVERSION N/A 11/25/2020   Procedure: CARDIOVERSION;  Surgeon: Larey Dresser, MD;  Location: [place];  Service: Cardiovascular;  Laterality: N/A;  . CORONARY  ANGIOPLASTY WITH STENT PLACEMENT    . LEFT HEART CATHETERIZATION WITH CORONARY ANGIOGRAM N/A 11/10/2014   Procedure: LEFT HEART CATHETERIZATION WITH CORONARY ANGIOGRAM;  Surgeon: Wellington Hampshire, MD;  Location: [place] CATH LAB;  Service: Cardiovascular;  Laterality: N/A;  . PERCUTANEOUS CORONARY STENT INTERVENTION (PCI-S)  11/10/2014   Procedure: PERCUTANEOUS CORONARY STENT INTERVENTION (PCI-S);  Surgeon: Wellington Hampshire, MD;  Location: [facility] CATH LAB;  Service: Cardiovascular;;    Family History  Problem Relation Age of Onset  . Cancer Mother        breast  . Cancer Father        colon    Social History   Socioeconomic History  . Marital status: Married    Spouse name: Not on file  . Number of children: Not on file  . Years of education: Not on file  . Highest education level: Not on file  Occupational History  . Occupation: retired  Tobacco Use  . Smoking status: Never Smoker  . Smokeless tobacco: Never Used  . Tobacco comment: "smoked when I was 22"  Vaping Use  . Vaping Use: Never used  Substance and Sexual Activity  . Alcohol use: Yes    Alcohol/week: 7.0 - 14.0 standard drinks    Types: 7 - 14 Cans of beer per week    Comment: occ  . Drug use: No  . Sexual activity: Not Currently  Other Topics Concern  . Not on file  Social History Narrative  . Not on file   Social Determinants of Health   Financial Resource Strain: Low Risk   . Difficulty of Paying Living Expenses: Not hard at all  Food Insecurity: No Food Insecurity  . Worried About Charity fundraiser in the Last Year: Never true  . Ran Out of Food in the Last Year: Never true  Transportation Needs: No Transportation Needs  . Lack of Transportation (Medical): No  . Lack of Transportation (Non-Medical): No  Physical Activity: Sufficiently Active  . Days of Exercise per Week: 5 days  . Minutes of Exercise per Session: 30 min  Stress: Stress Concern Present  . Feeling of Stress : To some extent  Social  Connections: Moderately Isolated  . Frequency of Communication with Friends and Family: More than three times a week  . Frequency of Social Gatherings with Friends and Family: Three times a week  . Attends Religious Services: Never  . Active Member of Clubs or Organizations: No  . Attends Archivist Meetings: Never  . Marital Status: Married  Human resources officer Violence: Not At Risk  . Fear of Current or Ex-Partner: No  . Emotionally Abused: No  . Physically Abused: No  . Sexually Abused: No    Outpatient Medications Prior to Visit  Medication Sig Dispense Refill  . amiodarone (PACERONE) 200 MG tablet Take 100 mg by mouth daily at 12 noon.    . Ascorbic Acid (VITAMIN C) 1000 MG tablet Take 1,000 mg by mouth daily.    Marland Kitchen atorvastatin (LIPITOR) 80 MG tablet Take 80 mg by mouth at bedtime.    . calcium carbonate (TUMS - DOSED IN MG ELEMENTAL CALCIUM) 500 MG chewable tablet Chew 500 mg by mouth daily.    . furosemide (LASIX) 40 MG tablet Take 60 mg by mouth daily.    Marland Kitchen gabapentin (NEURONTIN) 300 MG capsule Take 900 mg by mouth at bedtime.    Marland Kitchen levothyroxine (SYNTHROID) 25 MCG tablet TAKE 1 TABLET BY MOUTH ONCE DAILY BEFORE BREAKFAST . APPOINTMENT REQUIRED FOR FUTURE REFILLS 90 tablet 0  . midodrine (PROAMATINE) 2.5 MG tablet Take 1 tablet (2.5 mg total) by mouth 3 (three) times daily with meals. 90 tablet 2  . nitroGLYCERIN (NITROSTAT) 0.4 MG SL tablet Place 1 tablet (0.4 mg total) under the tongue every 5 (five) minutes x 3 doses as needed for chest pain. 25 tablet 2  . potassium chloride (KLOR-CON) 10 MEQ tablet Take 10 mEq by mouth at bedtime.    . primidone (MYSOLINE) 50 MG tablet Take 50 mg by mouth daily.    Marland Kitchen triamcinolone (KENALOG) 0.1 % Apply 1 application topically 2 (two) times daily. (Patient taking differently: Apply 1 application topically daily as needed (Rash).) 453.6 g 1  . warfarin (COUMADIN) 5 MG tablet TAKE AS DIRECTED BY COUMADIN CLINIC 40 tablet 1  . DULoxetine  (CYMBALTA) 30 MG capsule Take 1 capsule (30 mg total) by mouth daily. 30 capsule 3   No facility-administered medications prior to visit.    No Known Allergies  ROS Review of Systems  Constitutional: Negative for chills and fever.  Respiratory: Negative for shortness of breath.   Cardiovascular: Negative for chest pain.  Endocrine: Negative for cold intolerance and heat intolerance.  Neurological: Negative for headaches.      Objective:    Physical Exam Vitals reviewed.  Constitutional:      Appearance: Normal appearance.  Cardiovascular:  Rate and Rhythm: Normal rate.  Pulmonary:     Effort: Pulmonary effort is normal.     Breath sounds: Normal breath sounds.  Neurological:     Mental Status: He is alert.     BP 124/62 (BP Location: Left Arm, Patient Position: Sitting, Cuff Size: Normal)   Pulse 81   Temp 98.2 F (36.8 C) (Oral)   Wt 171 lb 11.2 oz (77.9 kg)   SpO2 96%   BMI 26.89 kg/m  Wt Readings from Last 3 Encounters:  01/10/21 171 lb 11.2 oz (77.9 kg)  12/21/20 167 lb (75.8 kg)  12/15/20 167 lb 9.6 oz (76 kg)     Health Maintenance Due  Topic Date Due  . COVID-19 Vaccine (3 - Pfizer risk 4-dose series) 08/04/2020    There are no preventive care reminders to display for this patient.  Lab Results  Component Value Date   TSH 8.125 (H) 11/02/2020   Lab Results  Component Value Date   WBC 5.6 12/15/2020   HGB 12.4 (L) 12/15/2020   HCT 36.8 (L) 12/15/2020   MCV 93.9 12/15/2020   PLT 147 (L) 12/15/2020   Lab Results  Component Value Date   NA 135 12/15/2020   K 3.8 12/15/2020   CO2 28 12/15/2020   GLUCOSE 77 12/15/2020   BUN 23 12/15/2020   CREATININE 2.04 (H) 12/15/2020   BILITOT 1.0 11/02/2020   ALKPHOS 65 11/02/2020   AST 24 11/02/2020   ALT 12 11/02/2020   PROT 6.7 11/02/2020   ALBUMIN 3.5 11/02/2020   CALCIUM 7.8 (L) 12/15/2020   ANIONGAP 6 12/15/2020   GFR 66.35 10/20/2015   Lab Results  Component Value Date   CHOL 180  08/31/2020   Lab Results  Component Value Date   HDL 54 08/31/2020   Lab Results  Component Value Date   LDLCALC 107 (H) 08/31/2020   Lab Results  Component Value Date   TRIG 97 08/31/2020   Lab Results  Component Value Date   CHOLHDL 3.3 08/31/2020   Lab Results  Component Value Date   HGBA1C 5.4 11/07/2014      Assessment & Plan:   #1 history of severe postherpetic neuralgia following shingles several years ago.  Severe right facial pain.  Some improvement with Cymbalta.  -Continue nighttime use of gabapentin.  He has been limited to nighttime use because of daytime sedation with gabapentin -Titrate Cymbalta to 60 mg once daily  #2 hypothyroidism.  Recent initiation of levothyroxine  -Recheck TSH today -Titrate medication slowly, if indicated, because of his age and cardiac history  Meds ordered this encounter  Medications  . DULoxetine (CYMBALTA) 60 MG capsule    Sig: Take 1 capsule (60 mg total) by mouth daily.    Dispense:  90 capsule    Refill:  3    Follow-up: Return in about 6 months (around 07/12/2021).    Carolann Littler, MD

## 2021-01-11 LAB — TSH: TSH: 6.99 u[IU]/mL — ABNORMAL HIGH (ref 0.35–4.50)

## 2021-01-18 ENCOUNTER — Ambulatory Visit (INDEPENDENT_AMBULATORY_CARE_PROVIDER_SITE_OTHER): Payer: Medicare Other | Admitting: *Deleted

## 2021-01-18 DIAGNOSIS — Z7901 Long term (current) use of anticoagulants: Secondary | ICD-10-CM | POA: Diagnosis not present

## 2021-01-18 DIAGNOSIS — I480 Paroxysmal atrial fibrillation: Secondary | ICD-10-CM | POA: Diagnosis not present

## 2021-01-18 LAB — POCT INR: INR: 2.8 (ref 2.0–3.0)

## 2021-01-18 NOTE — Patient Instructions (Signed)
Description   Continue taking Warfarin 1 tablet daily except 1.5 tablets on Sundays and Fridays.  Recheck INR in 6 weeks. Call us with any medication changes or concern #[phone] Coumadin Clinic, Main # [phone].       

## 2021-01-19 ENCOUNTER — Encounter: Payer: Self-pay | Admitting: Family Medicine

## 2021-01-23 ENCOUNTER — Other Ambulatory Visit: Payer: Self-pay

## 2021-01-23 ENCOUNTER — Telehealth: Payer: Self-pay | Admitting: Family Medicine

## 2021-01-23 MED ORDER — LEVOTHYROXINE SODIUM 50 MCG PO TABS: 50.0000 ug | ORAL_TABLET | Freq: Every day | ORAL | 0 refills | Status: DC

## 2021-01-23 NOTE — Telephone Encounter (Signed)
Patients wife aware of results. Has appt in June. Sent in new dose of levothyroxine sent to pharmacy.

## 2021-01-23 NOTE — Telephone Encounter (Signed)
pts spouse is calling in to get his lab results they were out of town when the calls were made.  Spouse would like to have a call back.

## 2021-01-26 ENCOUNTER — Encounter (HOSPITAL_COMMUNITY): Payer: Medicare Other

## 2021-01-26 NOTE — Progress Notes (Incomplete)
Advanced Heart Failure Progress Note    PCP: Dr. [name] Cardiology: Dr. Angelena Form HF Cardiology: Dr. Aundra Dubin  87 y.o. with history of persistent atrial fibrillation, CAD, and chronic systolic CHF returns for followup of CHF and atrial fibrillation.  Patient was admitted in 2/16 with afib/RVR.  Echo showed EF 25-30% at that time.  He had LHC, showing 90% mLAD stenosis that was treated with DES. In 4/16, he was cardioverted back to NSR.  Repeat echo in NSR showed EF 45-50%.  He was then admitted in 1/17 and again in 3/17 with afib/RVR (recurrent).  He did not have repeat DCCV. Echo in 3/17 showed EF 40% with mildly decreased RV systolic function.  He has remained in atrial fibrillation.   Last echo in 10/18 showed EF 55-60% with basal inferior akinesis, mildly dilated RV with mildly decreased RV systolic function.    He was admitted for Tikosyn initiation and converted to NSR, but Tikosyn had to be stopped because of prolonged QT interval. Amiodarone was started after; he is still on amiodarone today.   Patient returned for followup of CHF and atrial fibrillation 11/2017.  He was in sinus bradycardia. Toprol was decreased to 50 mg due to SB.  Started having falls 6/21 and sinus bradycardia. Toprol was stopped and amiodarone decreased to 100 mg daily.   Had clinic f/u 2/22 and he was in atrial flutter, asymptomatic. Amio increased to 200 mg bid, with hopes of chemically converting. Didn't happen. Required DCCV 11/26/19. Converted to NSR w/ PACs.   Of note, he was also fluid overloaded at his last clinic visit. ReDs clip was elevated at 40%. Lasix was increased to 80 mg daily x 3 days, then back to 60 mg daily.  Had return f/u on 3/31 and volume status had improved. ReDs Clip 30%. Wt was down 11 lb from prior visit, 178>>167 lb. BP remained soft and he continued w/ orthostatic hypotension orthostatic symptoms but no falls. Orthostatic Vital Signs  Laying 120/98 Sitting 106/62 Standing  82/52   He was also unfortunately back in atrial flutter but rate controlled in the 70s.   We discontinued lisinopril due to low BP. He had return f/u w/ RN for f/u BP check and was still hypotensive/ orthostatic. We added 2.5 mg of midodrine tid.   He returns back today for f/u. Here w/ his wife.      ECG (personally reviewed): atrial flutter 70 bpm   Labs (5/17): LDL 64, HDL 66 Labs (7/18): K 4.3, creatinine 1.42 Labs (9/18): K 4.5, creatinine 1.46, BNP 501  Labs (11/18): K 4, creatinine 1.43, tbili 1.4, AST/ALT normal, TSH elevated but free T3 and free T4 normal.  Labs (1/19): LDL 93, HDL 63, hgb 13.5, TSH 9, free T3 and T4 normal, LFTs normal, K 4.2, creatinine 1.57 Labs (12/21): K 4.6, creatinine 2.02, LDL 107, HDL 54, hgb 13.2, TSH 9.4 (Synthroid increased by PCP Labs (2/22): K 4.5, creatinine 2.25 Labs (3/22): K 4.2, creatinine 2.13 INR 3.0    PMH: 1. HTN 2. CKD stage 3 3. Hyperlipidemia 4. CAD:  - LHC 2/16 with 90% mLAD, 50% RCA => DES to mLAD.  5. 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  - Echo (2/16) with EF 25-30%, moderate MR while in afib.  - Echo (4/16) in NSR with EF 45-50%, no MR.  - Echo (3/17) in atrial fibrillation with EF 40%, mildly decreased RV systolic function.  - Echo (10/18): EF 55-60%, basal inferior  akinesis, mild LVH, mildly dilated RV with mildly decreased systolic function, mild MR. 6. Atrial fibrillation: Persistent.  - 2/16 admission for afib/RVR => DCCV in 4/16.  - 1/17 admission with afib/RVR - 3/17 admission with afib/RVR 7. PFTs (11/18): moderate obstructive airways disease.   Social History   Socioeconomic History  . Marital status: Married    Spouse name: Not on file  . Number of children: Not on file  . Years of education: Not on file  . Highest education level: Not on file  Occupational History  . Occupation: retired  Tobacco Use  . Smoking status: Never Smoker  . Smokeless tobacco:  Never Used  . Tobacco comment: "smoked when I was 22"  Vaping Use  . Vaping Use: Never used  Substance and Sexual Activity  . Alcohol use: Yes    Alcohol/week: 7.0 - 14.0 standard drinks    Types: 7 - 14 Cans of beer per week    Comment: occ  . Drug use: No  . Sexual activity: Not Currently  Other Topics Concern  . Not on file  Social History Narrative  . Not on file   Social Determinants of Health   Financial Resource Strain: Low Risk   . Difficulty of Paying Living Expenses: Not hard at all  Food Insecurity: No Food Insecurity  . Worried About Charity fundraiser in the Last Year: Never true  . Ran Out of Food in the Last Year: Never true  Transportation Needs: No Transportation Needs  . Lack of Transportation (Medical): No  . Lack of Transportation (Non-Medical): No  Physical Activity: Sufficiently Active  . Days of Exercise per Week: 5 days  . Minutes of Exercise per Session: 30 min  Stress: Stress Concern Present  . Feeling of Stress : To some extent  Social Connections: Moderately Isolated  . Frequency of Communication with Friends and Family: More than three times a week  . Frequency of Social Gatherings with Friends and Family: Three times a week  . Attends Religious Services: Never  . Active Member of Clubs or Organizations: No  . Attends Archivist Meetings: Never  . Marital Status: Married  Human resources officer Violence: Not At Risk  . Fear of Current or Ex-Partner: No  . Emotionally Abused: No  . Physically Abused: No  . Sexually Abused: No   Family History  Problem Relation Age of Onset  . Cancer Mother        breast  . Cancer Father        colon   ROS: All systems reviewed and negative except as per HPI.   Current Outpatient Medications  Medication Sig Dispense Refill  . amiodarone (PACERONE) 200 MG tablet Take 100 mg by mouth daily at 12 noon.    . Ascorbic Acid (VITAMIN C) 1000 MG tablet Take 1,000 mg by mouth daily.    Marland Kitchen atorvastatin  (LIPITOR) 80 MG tablet Take 80 mg by mouth at bedtime.    . calcium carbonate (TUMS - DOSED IN MG ELEMENTAL CALCIUM) 500 MG chewable tablet Chew 500 mg by mouth daily.    . DULoxetine (CYMBALTA) 60 MG capsule Take 1 capsule (60 mg total) by mouth daily. 90 capsule 3  . furosemide (LASIX) 40 MG tablet Take 60 mg by mouth daily.    Marland Kitchen gabapentin (NEURONTIN) 300 MG capsule Take 900 mg by mouth at bedtime.    Marland Kitchen levothyroxine (SYNTHROID) 50 MCG tablet Take 1 tablet (50 mcg total) by mouth daily. [place]  tablet 0  . midodrine (PROAMATINE) 2.5 MG tablet Take 1 tablet (2.5 mg total) by mouth 3 (three) times daily with meals. 90 tablet 2  . nitroGLYCERIN (NITROSTAT) 0.4 MG SL tablet Place 1 tablet (0.4 mg total) under the tongue every 5 (five) minutes x 3 doses as needed for chest pain. 25 tablet 2  . potassium chloride (KLOR-CON) 10 MEQ tablet Take 10 mEq by mouth at bedtime.    . primidone (MYSOLINE) 50 MG tablet Take 50 mg by mouth daily.    Marland Kitchen triamcinolone (KENALOG) 0.1 % Apply 1 application topically 2 (two) times daily. (Patient taking differently: Apply 1 application topically daily as needed (Rash).) 453.6 g 1  . warfarin (COUMADIN) 5 MG tablet TAKE AS DIRECTED BY COUMADIN CLINIC 40 tablet 1   No current facility-administered medications for this visit.   Wt Readings from Last 3 Encounters:  01/10/21 77.9 kg  12/21/20 75.8 kg  12/15/20 76 kg   There were no vitals taken for this visit. PHYSICAL EXAM: General:  Well appearing. No respiratory difficulty HEENT: normal Neck: supple. no JVD. Carotids 2+ bilat; no bruits. No lymphadenopathy or thyromegaly appreciated. Cor: PMI nondisplaced. Regular rate & rhythm. No rubs, gallops or murmurs. Lungs: clear Abdomen: soft, nontender, nondistended. No hepatosplenomegaly. No bruits or masses. Good bowel sounds. Extremities: no cyanosis, clubbing, rash, edema Neuro: alert & oriented x 3, cranial nerves grossly intact. moves all 4 extremities w/o difficulty.  Affect pleasant.    Assessment/Plan: 1. 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  EF has fallen when he has been in atrial fibrillation. However, echo in 3/20 showed EF up to 55-60%.  NYHA III symptoms, likely due to general inactivity and deconditioning.  - Volume status good. Euvolemic on exam. Wt down 11 lb from last visit. ReDs Clip 30% - Continue Lasix 60 mg daily  - His Toprol was stopped 6/21 due to SB and frequent falls w/ rib fractures. - Off lisinopril w/ low BP  - Check BMP today  2. Atrial fibrillation/ AFlutter: Paroxysmal.  He was unable to continue Tikosyn due to prolonged QT interval. In Atrial Flutter 2/22>>underwent DCCV 11/25/20 to NSR. Back in Aflutter on EKG today, HR controlled in the 70s. Asymptomatic. - We discussed reattempt at DCCV vs rate control strategy. Given lack of symptoms, he opts to continue w/ rate control. Continue amiodarone 200 mg daily. He will notify us if development of symptoms.  - Continue warfarin.  INRs followed by [facility] Coumadin clinic 3. CKD: Stage 3.   - Check BMP today  4. CAD: DES to LAD in 2/16.  Denies CP  - No ASA given stable CAD and warfarin use.  - Continue atorvastatin 80 mg daily. 5. Suspect OSA: Snoring. Sleep study, pending. 6. Orthostatic Hypotension: now off metoprolol and lisinopril  - c/w midodrine  - advised to use caution w/ positional changes and add TED hoses   RN visit in 7-10 days to recheck orthostatics. F/u again w/ APP in 4-6 weeks.    Lyda Jester  PA-C  01/26/2021

## 2021-01-27 ENCOUNTER — Other Ambulatory Visit: Payer: Self-pay | Admitting: Cardiovascular Disease

## 2021-02-06 ENCOUNTER — Telehealth: Payer: Self-pay

## 2021-02-06 NOTE — Telephone Encounter (Signed)
This is a CHF pt, Dr. Mclean 

## 2021-02-20 ENCOUNTER — Other Ambulatory Visit (HOSPITAL_COMMUNITY): Payer: Self-pay | Admitting: Unknown Physician Specialty

## 2021-02-20 MED ORDER — NITROGLYCERIN 0.4 MG SL SUBL
0.4000 mg | SUBLINGUAL_TABLET | SUBLINGUAL | 2 refills | Status: DC | PRN
Start: 2021-02-20 — End: 2023-10-15

## 2021-02-27 ENCOUNTER — Other Ambulatory Visit: Payer: Self-pay | Admitting: Family Medicine

## 2021-03-01 ENCOUNTER — Other Ambulatory Visit: Payer: Self-pay

## 2021-03-01 ENCOUNTER — Ambulatory Visit (INDEPENDENT_AMBULATORY_CARE_PROVIDER_SITE_OTHER): Payer: Medicare Other | Admitting: Family Medicine

## 2021-03-01 ENCOUNTER — Encounter: Payer: Self-pay | Admitting: Family Medicine

## 2021-03-01 VITALS — BP 130/60 | HR 83 | Temp 98.1°F | Wt 165.6 lb

## 2021-03-01 DIAGNOSIS — R195 Other fecal abnormalities: Secondary | ICD-10-CM | POA: Diagnosis not present

## 2021-03-01 DIAGNOSIS — R634 Abnormal weight loss: Secondary | ICD-10-CM | POA: Diagnosis not present

## 2021-03-01 DIAGNOSIS — E039 Hypothyroidism, unspecified: Secondary | ICD-10-CM

## 2021-03-01 DIAGNOSIS — I2510 Atherosclerotic heart disease of native coronary artery without angina pectoris: Secondary | ICD-10-CM

## 2021-03-01 LAB — TSH: TSH: 3.56 u[IU]/mL (ref 0.35–4.50)

## 2021-03-01 NOTE — Progress Notes (Signed)
Established Patient Office Visit  Subjective:  Patient ID: Ricardo Valencia, male    DOB: January 14, 1929  Age: 87 y.o. MRN: [id number]  CC:  Chief Complaint  Patient presents with   Follow-up    HPI Ricardo Valencia presents for medical follow-up.    Hypothyroidism.  Recently initiated levothyroxine.  Last TSH was improved but still elevated.  We titrated his levothyroxine to 50 mcg.  They are taking this appropriately first thing in the morning on empty stomach and not with other medications.  Wife oversees his medications.  He has multiple skin cancers.  We had referred to dermatology.  They were actually at dermatology office and he had fairly sudden urgency to stool and had large-volume loose stool.  They ended up leaving dermatology before he was seen.  They state he has had this a couple times in the past.  No bloody stools.  Has had some modest weight loss of about 5 to 6 pounds since last visit.  He states his appetite is good.  Wife states he is eating regularly.  No abdominal pain.  No chronic cough.  No headaches.  Loose stools have been very inconsistent with no clear triggers.   No constipation history.  Weight has declined modestly in recent months.   He states he has fairly good appetite and wife thinks he is eating reasonably well    Past Medical History:  Diagnosis Date   CAD (coronary artery disease)    DES to mid LAD 8/67/6195   Chronic systolic heart failure (HCC)    Hay fever    hay fever, allergies   History of echocardiogram    Echo 3/17: EF 40%, diff HK, trivial AI, trivial MR, mod LAE, mild reduced RVSF, mod RAE   Hyperlipidemia    Hypertension    Persistent atrial fibrillation [facility])     Past Surgical History:  Procedure Laterality Date   APPENDECTOMY  1947   CARDIOVERSION N/A 12/30/2014   Procedure: CARDIOVERSION;  Surgeon: Jerline Pain, MD;  Location: [facility];  Service: Cardiovascular;  Laterality: N/A;   CARDIOVERSION N/A 11/25/2020    Procedure: CARDIOVERSION;  Surgeon: Larey Dresser, MD;  Location: [facility] ENDOSCOPY;  Service: Cardiovascular;  Laterality: N/A;   CORONARY ANGIOPLASTY WITH STENT PLACEMENT     LEFT HEART CATHETERIZATION WITH CORONARY ANGIOGRAM N/A 11/10/2014   Procedure: LEFT HEART CATHETERIZATION WITH CORONARY ANGIOGRAM;  Surgeon: Wellington Hampshire, MD;  Location: [facility] CATH LAB;  Service: Cardiovascular;  Laterality: N/A;   PERCUTANEOUS CORONARY STENT INTERVENTION (PCI-S)  11/10/2014   Procedure: PERCUTANEOUS CORONARY STENT INTERVENTION (PCI-S);  Surgeon: Wellington Hampshire, MD;  Location: [facility] CATH LAB;  Service: Cardiovascular;;    Family History  Problem Relation Age of Onset   Cancer Mother        breast   Cancer Father        colon    Social History   Socioeconomic History   Marital status: Married    Spouse name: Not on file   Number of children: Not on file   Years of education: Not on file   Highest education level: Not on file  Occupational History   Occupation: retired  Tobacco Use   Smoking status: Never   Smokeless tobacco: Never   Tobacco comments:    "smoked when I was 22"  Vaping Use   Vaping Use: Never used  Substance and Sexual Activity   Alcohol use: Yes    Alcohol/week: 7.0 - 14.0 standard  drinks    Types: 7 - 14 Cans of beer per week    Comment: occ   Drug use: No   Sexual activity: Not Currently  Other Topics Concern   Not on file  Social History Narrative   Not on file   Social Determinants of Health   Financial Resource Strain: Low Risk    Difficulty of Paying Living Expenses: Not hard at all  Food Insecurity: No Food Insecurity   Worried About Charity fundraiser in the Last Year: Never true   Ran Out of Food in the Last Year: Never true  Transportation Needs: No Transportation Needs   Lack of Transportation (Medical): No   Lack of Transportation (Non-Medical): No  Physical Activity: Sufficiently Active   Days of Exercise per Week: 5 days   Minutes of Exercise  per Session: 30 min  Stress: Stress Concern Present   Feeling of Stress : To some extent  Social Connections: Moderately Isolated   Frequency of Communication with Friends and Family: More than three times a week   Frequency of Social Gatherings with Friends and Family: Three times a week   Attends Religious Services: Never   Active Member of Clubs or Organizations: No   Attends Archivist Meetings: Never   Marital Status: Married  Human resources officer Violence: Not At Risk   Fear of Current or Ex-Partner: No   Emotionally Abused: No   Physically Abused: No   Sexually Abused: No    Outpatient Medications Prior to Visit  Medication Sig Dispense Refill   amiodarone (PACERONE) 200 MG tablet Take 100 mg by mouth daily at 12 noon.     Ascorbic Acid (VITAMIN C) 1000 MG tablet Take 1,000 mg by mouth daily.     atorvastatin (LIPITOR) 80 MG tablet TAKE 1 TABLET BY MOUTH ONCE DAILY AT 6PM 30 tablet 0   calcium carbonate (TUMS - DOSED IN MG ELEMENTAL CALCIUM) 500 MG chewable tablet Chew 500 mg by mouth daily.     DULoxetine (CYMBALTA) 60 MG capsule Take 1 capsule (60 mg total) by mouth daily. 90 capsule 3   furosemide (LASIX) 40 MG tablet Take 60 mg by mouth daily.     gabapentin (NEURONTIN) 300 MG capsule Take 900 mg by mouth at bedtime.     levothyroxine (SYNTHROID) 50 MCG tablet Take 1 tablet (50 mcg total) by mouth daily. 90 tablet 0   midodrine (PROAMATINE) 2.5 MG tablet Take 1 tablet (2.5 mg total) by mouth 3 (three) times daily with meals. 90 tablet 2   nitroGLYCERIN (NITROSTAT) 0.4 MG SL tablet Place 1 tablet (0.4 mg total) under the tongue every 5 (five) minutes x 3 doses as needed for chest pain. 25 tablet 2   potassium chloride (KLOR-CON) 10 MEQ tablet Take 10 mEq by mouth at bedtime.     primidone (MYSOLINE) 50 MG tablet Take 50 mg by mouth daily.     triamcinolone (KENALOG) 0.1 % Apply 1 application topically 2 (two) times daily. (Patient taking differently: Apply 1 application  topically daily as needed (Rash).) 453.6 g 1   warfarin (COUMADIN) 5 MG tablet TAKE AS DIRECTED BY COUMADIN CLINIC 40 tablet 1   No facility-administered medications prior to visit.    No Known Allergies  ROS Review of Systems  Constitutional:  Positive for unexpected weight change. Negative for appetite change, chills and fever.  Respiratory:  Negative for cough and shortness of breath.   Cardiovascular:  Negative for chest pain.  Gastrointestinal:  Positive for diarrhea. Negative for abdominal pain, blood in stool, constipation, nausea and vomiting.  Genitourinary:  Negative for dysuria.     Objective:    Physical Exam Vitals reviewed.  Constitutional:      Appearance: Normal appearance.  Cardiovascular:     Rate and Rhythm: Normal rate and regular rhythm.  Pulmonary:     Effort: Pulmonary effort is normal.     Breath sounds: Normal breath sounds.  Abdominal:     Palpations: Abdomen is soft.     Tenderness: There is no abdominal tenderness.  Musculoskeletal:     Right lower leg: No edema.     Left lower leg: No edema.  Neurological:     Mental Status: He is alert.    BP 130/60 (BP Location: Left Arm, Patient Position: Sitting, Cuff Size: Normal)   Pulse 83   Temp 98.1 F (36.7 C) (Oral)   Wt 165 lb 9.6 oz (75.1 kg)   SpO2 97%   BMI 25.94 kg/m  Wt Readings from Last 3 Encounters:  03/01/21 165 lb 9.6 oz (75.1 kg)  01/10/21 171 lb 11.2 oz (77.9 kg)  12/21/20 167 lb (75.8 kg)     Health Maintenance Due  Topic Date Due   Zoster Vaccines- Shingrix (1 of 2) Never done   COVID-19 Vaccine (3 - Pfizer risk series) 08/04/2020    There are no preventive care reminders to display for this patient.  Lab Results  Component Value Date   TSH 6.99 (H) 01/10/2021   Lab Results  Component Value Date   WBC 5.6 12/15/2020   HGB 12.4 (L) 12/15/2020   HCT 36.8 (L) 12/15/2020   MCV 93.9 12/15/2020   PLT 147 (L) 12/15/2020   Lab Results  Component Value Date   NA  135 12/15/2020   K 3.8 12/15/2020   CO2 28 12/15/2020   GLUCOSE 77 12/15/2020   BUN 23 12/15/2020   CREATININE 2.04 (H) 12/15/2020   BILITOT 1.0 11/02/2020   ALKPHOS 65 11/02/2020   AST 24 11/02/2020   ALT 12 11/02/2020   PROT 6.7 11/02/2020   ALBUMIN 3.5 11/02/2020   CALCIUM 7.8 (L) 12/15/2020   ANIONGAP 6 12/15/2020   GFR 66.35 10/20/2015   Lab Results  Component Value Date   CHOL 180 08/31/2020   Lab Results  Component Value Date   HDL 54 08/31/2020   Lab Results  Component Value Date   LDLCALC 107 (H) 08/31/2020   Lab Results  Component Value Date   TRIG 97 08/31/2020   Lab Results  Component Value Date   CHOLHDL 3.3 08/31/2020   Lab Results  Component Value Date   HGBA1C 5.4 11/07/2014      Assessment & Plan:   #1 hypothyroidism.  Recent adjustment in thyroid medication.  Currently on levothyroxine 50 mcg daily  -Recheck TSH today and adjust medication accordingly  #2 weight loss.  They relate good appetite and he reportedly is eating regularly and well. -Consider supplements such as Boost or Ensure -They do have home scales and we recommend they monitor regularly and be in touch if he continues to lose weight over the next month or so  #3 recent intermittent loose stools.  Symptoms not consistent.  No reported fevers or bloody stools.  No abdominal pain.  Look for any dietary correlation.    No orders of the defined types were placed in this encounter.   Follow-up: Return in about 3 months (around 06/01/2021).    Carolann Littler,  MD

## 2021-03-03 ENCOUNTER — Other Ambulatory Visit: Payer: Self-pay | Admitting: Cardiovascular Disease

## 2021-03-03 NOTE — Telephone Encounter (Signed)
Rx(s) sent to pharmacy electronically.  

## 2021-03-06 ENCOUNTER — Other Ambulatory Visit: Payer: Self-pay

## 2021-03-06 ENCOUNTER — Ambulatory Visit (INDEPENDENT_AMBULATORY_CARE_PROVIDER_SITE_OTHER): Payer: Medicare Other

## 2021-03-06 DIAGNOSIS — Z7901 Long term (current) use of anticoagulants: Secondary | ICD-10-CM

## 2021-03-06 DIAGNOSIS — I480 Paroxysmal atrial fibrillation: Secondary | ICD-10-CM

## 2021-03-06 LAB — POCT INR: INR: 2.9 (ref 2.0–3.0)

## 2021-03-06 NOTE — Patient Instructions (Signed)
Description   Continue taking Warfarin 1 tablet daily except 1.5 tablets on Sundays and Fridays.  Recheck INR in 6 weeks. Call us with any medication changes or concern [phone] [facility] Main # [phone].

## 2021-03-22 ENCOUNTER — Other Ambulatory Visit: Payer: Self-pay | Admitting: Cardiovascular Disease

## 2021-03-22 DIAGNOSIS — I480 Paroxysmal atrial fibrillation: Secondary | ICD-10-CM

## 2021-03-24 ENCOUNTER — Other Ambulatory Visit: Payer: Self-pay | Admitting: Family Medicine

## 2021-04-06 ENCOUNTER — Other Ambulatory Visit: Payer: Self-pay | Admitting: *Deleted

## 2021-04-06 ENCOUNTER — Other Ambulatory Visit: Payer: Self-pay | Admitting: Cardiovascular Disease

## 2021-04-17 ENCOUNTER — Ambulatory Visit (INDEPENDENT_AMBULATORY_CARE_PROVIDER_SITE_OTHER): Payer: Medicare Other

## 2021-04-17 ENCOUNTER — Other Ambulatory Visit: Payer: Self-pay

## 2021-04-17 DIAGNOSIS — Z7901 Long term (current) use of anticoagulants: Secondary | ICD-10-CM

## 2021-04-17 DIAGNOSIS — I480 Paroxysmal atrial fibrillation: Secondary | ICD-10-CM | POA: Diagnosis not present

## 2021-04-17 DIAGNOSIS — Z5181 Encounter for therapeutic drug level monitoring: Secondary | ICD-10-CM | POA: Diagnosis not present

## 2021-04-17 LAB — POCT INR: INR: 5.3 — AB (ref 2.0–3.0)

## 2021-04-17 NOTE — Patient Instructions (Signed)
-   skip warfarin tonight or tomorrow, then - continue taking Warfarin 1 tablet daily except 1.5 tablets on Sundays and Fridays.   - Recheck INR in 3 weeks.  Call us with any medication changes or concern [phone] [facility] Main # [phone].

## 2021-04-26 ENCOUNTER — Other Ambulatory Visit: Payer: Self-pay | Admitting: Family Medicine

## 2021-05-01 ENCOUNTER — Other Ambulatory Visit: Payer: Self-pay | Admitting: Cardiovascular Disease

## 2021-05-01 ENCOUNTER — Other Ambulatory Visit (HOSPITAL_COMMUNITY): Payer: Self-pay | Admitting: Cardiology

## 2021-05-08 ENCOUNTER — Other Ambulatory Visit: Payer: Self-pay

## 2021-05-08 ENCOUNTER — Ambulatory Visit: Payer: Medicare Other

## 2021-05-08 DIAGNOSIS — Z7901 Long term (current) use of anticoagulants: Secondary | ICD-10-CM | POA: Diagnosis not present

## 2021-05-08 DIAGNOSIS — Z5181 Encounter for therapeutic drug level monitoring: Secondary | ICD-10-CM

## 2021-05-08 DIAGNOSIS — I480 Paroxysmal atrial fibrillation: Secondary | ICD-10-CM

## 2021-05-08 LAB — POCT INR: INR: 4 — AB (ref 2.0–3.0)

## 2021-05-08 NOTE — Patient Instructions (Signed)
-   skip warfarin tonight,  - have some greens today, - START NEW DOSAGE of  Warfarin 1 tablet daily - Recheck INR in 3 weeks.  Call us with any medication changes or concern [phone] [facility] Main # [phone].

## 2021-05-11 ENCOUNTER — Other Ambulatory Visit: Payer: Self-pay | Admitting: Cardiovascular Disease

## 2021-05-29 ENCOUNTER — Ambulatory Visit (INDEPENDENT_AMBULATORY_CARE_PROVIDER_SITE_OTHER): Payer: Medicare Other

## 2021-05-29 ENCOUNTER — Other Ambulatory Visit: Payer: Self-pay

## 2021-05-29 DIAGNOSIS — Z7901 Long term (current) use of anticoagulants: Secondary | ICD-10-CM | POA: Diagnosis not present

## 2021-05-29 DIAGNOSIS — Z5181 Encounter for therapeutic drug level monitoring: Secondary | ICD-10-CM

## 2021-05-29 DIAGNOSIS — I480 Paroxysmal atrial fibrillation: Secondary | ICD-10-CM | POA: Diagnosis not present

## 2021-05-29 LAB — POCT INR: INR: 2.9 (ref 2.0–3.0)

## 2021-05-29 NOTE — Patient Instructions (Signed)
-   continue dosage of Warfarin 1 tablet daily - Recheck INR in 4 weeks.  Call us with any medication changes or concern [phone] [facility] Main # [phone].

## 2021-05-31 ENCOUNTER — Other Ambulatory Visit: Payer: Self-pay | Admitting: Family Medicine

## 2021-06-09 ENCOUNTER — Other Ambulatory Visit: Payer: Self-pay | Admitting: Family Medicine

## 2021-06-19 ENCOUNTER — Other Ambulatory Visit: Payer: Self-pay | Admitting: Cardiology

## 2021-06-26 ENCOUNTER — Other Ambulatory Visit: Payer: Self-pay

## 2021-06-26 ENCOUNTER — Other Ambulatory Visit: Payer: Self-pay | Admitting: Family Medicine

## 2021-06-26 ENCOUNTER — Ambulatory Visit: Payer: Medicare Other

## 2021-06-26 DIAGNOSIS — Z7901 Long term (current) use of anticoagulants: Secondary | ICD-10-CM

## 2021-06-26 DIAGNOSIS — I480 Paroxysmal atrial fibrillation: Secondary | ICD-10-CM | POA: Diagnosis not present

## 2021-06-26 DIAGNOSIS — Z5181 Encounter for therapeutic drug level monitoring: Secondary | ICD-10-CM | POA: Diagnosis not present

## 2021-06-26 LAB — POCT INR: INR: 1.6 — AB (ref 2.0–3.0)

## 2021-06-26 NOTE — Patient Instructions (Signed)
-   take extra 1/2 tablet tonight and tomorrow, then - continue dosage of Warfarin 1 tablet daily - Recheck INR in 3 weeks.  Call us with any medication changes or concern [phone] [facility] Main # [phone].

## 2021-07-10 ENCOUNTER — Other Ambulatory Visit (HOSPITAL_COMMUNITY): Payer: Self-pay | Admitting: Cardiology

## 2021-07-17 ENCOUNTER — Other Ambulatory Visit: Payer: Self-pay | Admitting: Family Medicine

## 2021-07-17 ENCOUNTER — Telehealth: Payer: Self-pay

## 2021-07-17 ENCOUNTER — Other Ambulatory Visit: Payer: Self-pay | Admitting: Cardiovascular Disease

## 2021-07-17 ENCOUNTER — Other Ambulatory Visit: Payer: Self-pay

## 2021-07-17 ENCOUNTER — Ambulatory Visit: Payer: Medicare Other | Admitting: *Deleted

## 2021-07-17 DIAGNOSIS — I480 Paroxysmal atrial fibrillation: Secondary | ICD-10-CM

## 2021-07-17 DIAGNOSIS — Z7901 Long term (current) use of anticoagulants: Secondary | ICD-10-CM

## 2021-07-17 LAB — POCT INR: INR: 3.9 — AB (ref 2.0–3.0)

## 2021-07-17 MED ORDER — GABAPENTIN 300 MG PO CAPS: 300.0000 mg | ORAL_CAPSULE | Freq: Three times a day (TID) | ORAL | 0 refills | Status: DC

## 2021-07-17 NOTE — Addendum Note (Signed)
Addended by: Otilio Miu on: 07/17/2021 11:45 AM   Modules accepted: Orders

## 2021-07-17 NOTE — Patient Instructions (Addendum)
Description   Do not take any Warfarin today then continue taking Warfarin 1 tablet daily. Recheck INR in 3 weeks-pt moving to [place]-lab order given. Call us with any medication changes or concern [phone] [facility] Main # [phone].

## 2021-07-17 NOTE — Telephone Encounter (Addendum)
Refill for Gabapentin 300mg  has been sent to [facility] [place] [facility] on [address].

## 2021-07-17 NOTE — Telephone Encounter (Signed)
Wife of patient called asking for an early refill for gabapentin (NEURONTIN) 300 MG capsule Wife stated that patient is moving to [place] in 3 days and they appreciate everything Dr. [name] has done for them and he will be missed.  Also stated they will send for medical records.

## 2021-07-18 NOTE — Telephone Encounter (Signed)
Pt was seen at HF clinic in 11/2020.  I have refilled lasix and will forward to HF clinic for follow up of his next visit.

## 2021-07-18 NOTE — Telephone Encounter (Signed)
The patient has not been seen since 02/2020. I sent a message to the schedulers to try to contact him for an appointment.   Is it ok to give another 30 day supply of medication?

## 2021-07-19 NOTE — Telephone Encounter (Signed)
Called and spoke with the pharmacy. Permission to fill rx today has been given.   Left message for patient to call back.

## 2021-07-19 NOTE — Telephone Encounter (Signed)
Wife called back and stated the gabapentin (NEURONTIN) 300 MG capsule was sent to pharmacy but needs approval for early release on today's date due to moving tomorrow

## 2021-07-19 NOTE — Telephone Encounter (Signed)
Please advise. Ok to give permission for the Rx to be filled today?

## 2021-07-20 NOTE — Telephone Encounter (Signed)
Spoke with the patient's pharmacy. He has picked up his prescription.

## 2021-08-04 DIAGNOSIS — E7800 Pure hypercholesterolemia, unspecified: Secondary | ICD-10-CM | POA: Diagnosis not present

## 2021-08-04 DIAGNOSIS — R54 Age-related physical debility: Secondary | ICD-10-CM | POA: Diagnosis not present

## 2021-08-04 DIAGNOSIS — I10 Essential (primary) hypertension: Secondary | ICD-10-CM | POA: Diagnosis not present

## 2021-08-04 DIAGNOSIS — I480 Paroxysmal atrial fibrillation: Secondary | ICD-10-CM | POA: Diagnosis not present

## 2021-08-07 ENCOUNTER — Telehealth: Payer: Self-pay

## 2021-08-07 MED ORDER — GABAPENTIN 300 MG PO CAPS: 300.0000 mg | ORAL_CAPSULE | Freq: Three times a day (TID) | ORAL | 0 refills | Status: DC

## 2021-08-07 NOTE — Telephone Encounter (Signed)
Wife of patient called requesting Rx refill and if refill was sent wife would like a transfer to  [facility] [address] [place] [address] gabapentin (NEURONTIN) 300 MG capsule

## 2021-08-07 NOTE — Addendum Note (Signed)
Addended by: Rebecca Eaton on: 08/07/2021 11:53 AM   Modules accepted: Orders

## 2021-08-07 NOTE — Telephone Encounter (Signed)
Rx sent to the requested pharmacy.  Left a detailed message on verified voice mail informing the patient that his medication was sent to the requested pharmacy.

## 2021-08-14 ENCOUNTER — Telehealth: Payer: Self-pay | Admitting: *Deleted

## 2021-08-14 ENCOUNTER — Telehealth (HOSPITAL_COMMUNITY): Payer: Self-pay

## 2021-08-14 DIAGNOSIS — I480 Paroxysmal atrial fibrillation: Secondary | ICD-10-CM | POA: Diagnosis not present

## 2021-08-14 NOTE — Telephone Encounter (Signed)
Called pt/wife since pt is overdue for Anticoagulation Monitoring; left a message on one contact number and called the dtr's number on previous DPR and left a message to have pt call back. Left the direct number to be reached.  Also, looks as if records were faxed to [facility] [place]; need to follow up.

## 2021-08-14 NOTE — Telephone Encounter (Signed)
Received a fax requesting medical records from [facility] [place]. Records were successfully faxed to: [phone] ,which was the number provided.. Medical request form will be scanned into patients chart.

## 2021-08-16 DIAGNOSIS — I480 Paroxysmal atrial fibrillation: Secondary | ICD-10-CM | POA: Diagnosis not present

## 2021-08-17 ENCOUNTER — Telehealth: Payer: Self-pay | Admitting: *Deleted

## 2021-08-17 NOTE — Telephone Encounter (Signed)
The dtr, Amy, called and stated the pt has been set up with [facility] [place] and he has been having his labs monitored. She was very appreciative of the call and checking in on the pt and wife. She will update them as well.

## 2021-08-21 DIAGNOSIS — B0229 Other postherpetic nervous system involvement: Secondary | ICD-10-CM | POA: Diagnosis not present

## 2021-08-21 DIAGNOSIS — E039 Hypothyroidism, unspecified: Secondary | ICD-10-CM | POA: Diagnosis not present

## 2021-08-21 DIAGNOSIS — E782 Mixed hyperlipidemia: Secondary | ICD-10-CM | POA: Diagnosis not present

## 2021-08-21 DIAGNOSIS — C4490 Unspecified malignant neoplasm of skin, unspecified: Secondary | ICD-10-CM | POA: Diagnosis not present

## 2021-08-21 DIAGNOSIS — I480 Paroxysmal atrial fibrillation: Secondary | ICD-10-CM | POA: Diagnosis not present

## 2021-08-31 ENCOUNTER — Other Ambulatory Visit: Payer: Self-pay | Admitting: Cardiovascular Disease

## 2021-08-31 DIAGNOSIS — I480 Paroxysmal atrial fibrillation: Secondary | ICD-10-CM

## 2021-08-31 NOTE — Telephone Encounter (Signed)
Called spoke with Amy, pt's daughter advised we [facility] [place] [facility]ill. Since pt now sees a cardiologist in [place] and has his Warfarin monitored and manged by them, they will need to refill rx.  [facility] or call cardiology office in [place] and request a new rx for Warfarin be sent to pt's pharmacy.  Pt's daughter verbalized understanding and thanked me for the call. Will refuse rx request to our office.

## 2021-08-31 NOTE — Progress Notes (Incomplete)
Advanced Heart Failure Progress Note    PCP: Dr. [name] Cardiology: Dr. Angelena Form HF Cardiology: Dr. Aundra Dubin  87 y.o. with history of persistent atrial fibrillation, CAD, and chronic systolic CHF returns for followup of CHF and atrial fibrillation.  Patient was admitted in 2/16 with afib/RVR.  Echo showed EF 25-30% at that time.  He had LHC, showing 90% mLAD stenosis that was treated with DES. In 4/16, he was cardioverted back to NSR.  Repeat echo in NSR showed EF 45-50%.  He was then admitted in 1/17 and again in 3/17 with afib/RVR (recurrent).  He did not have repeat DCCV. Echo in 3/17 showed EF 40% with mildly decreased RV systolic function.  He has remained in atrial fibrillation.   Last echo in 10/18 showed EF 55-60% with basal inferior akinesis, mildly dilated RV with mildly decreased RV systolic function.    He was admitted for Tikosyn initiation and converted to NSR, but Tikosyn had to be stopped because of prolonged QT interval. Amiodarone was started after; he is still on amiodarone today.   Patient returned for followup of CHF and atrial fibrillation 11/2017.  He was in sinus bradycardia. Toprol was decreased to 50 mg due to SB.  Started having falls 6/21 and sinus bradycardia. Toprol was stopped and amiodarone decreased to 100 mg daily.   Had clinic f/u 2/22 and he was in atrial flutter, asymptomatic. Amio increased to 200 mg bid, with hopes of chemically converting. Didn't happen. Required DCCV 11/26/19. Converted to NSR w/ PACs.   Of note, he was also fluid overloaded at his last clinic visit. ReDs clip was elevated at 40%. Lasix was increased to 80 mg daily x 3 days, then back to 60 mg daily.  Presents to clinic today for f/u. Volume status improved. ReDs Clip 30%. Wt is down 11 lb from prior visit, 178>>167 lb. BP is well controlled, 110/70 but had had some recent orthostatic symptoms but no falls. Unfortunately, he is back in atrial flutter but rate controlled in the 70s.  He denies dyspnea and fatigue. His PCP checked his INR yesterday and it was therapeutic at 3.0.   Orthostatic Vital Signs  Laying 120/98 Sitting 106/62 Standing 82/52     ECG (personally reviewed): atrial flutter 70 bpm   Labs (5/17): LDL 64, HDL 66 Labs (7/18): K 4.3, creatinine 1.42 Labs (9/18): K 4.5, creatinine 1.46, BNP 501  Labs (11/18): K 4, creatinine 1.43, tbili 1.4, AST/ALT normal, TSH elevated but free T3 and free T4 normal.  Labs (1/19): LDL 93, HDL 63, hgb 13.5, TSH 9, free T3 and T4 normal, LFTs normal, K 4.2, creatinine 1.57 Labs (12/21): K 4.6, creatinine 2.02, LDL 107, HDL 54, hgb 13.2, TSH 9.4 (Synthroid increased by PCP Labs (2/22): K 4.5, creatinine 2.25 Labs (3/22): K 4.2, creatinine 2.13 INR 3.0    PMH: 1. HTN 2. CKD stage 3 3. Hyperlipidemia 4. CAD:  - LHC 2/16 with 90% mLAD, 50% RCA => DES to mLAD.  5. 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  - Echo (2/16) with EF 25-30%, moderate MR while in afib.  - Echo (4/16) in NSR with EF 45-50%, no MR.  - Echo (3/17) in atrial fibrillation with EF 40%, mildly decreased RV systolic function.  - Echo (10/18): EF 55-60%, basal inferior akinesis, mild LVH, mildly dilated RV with mildly decreased systolic function, mild MR. 6. Atrial fibrillation: Persistent.  - 2/16 admission for afib/RVR => DCCV in 4/16.  -  1/17 admission with afib/RVR - 3/17 admission with afib/RVR 7. PFTs (11/18): moderate obstructive airways disease.   Social History   Socioeconomic History   Marital status: Married    Spouse name: Not on file   Number of children: Not on file   Years of education: Not on file   Highest education level: Not on file  Occupational History   Occupation: retired  Tobacco Use   Smoking status: Never   Smokeless tobacco: Never   Tobacco comments:    "smoked when I was 22"  Vaping Use   Vaping Use: Never used  Substance and Sexual Activity   Alcohol use: Yes     Alcohol/week: 7.0 - 14.0 standard drinks    Types: 7 - 14 Cans of beer per week    Comment: occ   Drug use: No   Sexual activity: Not Currently  Other Topics Concern   Not on file  Social History Narrative   Not on file   Social Determinants of Health   Financial Resource Strain: Low Risk    Difficulty of Paying Living Expenses: Not hard at all  Food Insecurity: No Food Insecurity   Worried About Charity fundraiser in the Last Year: Never true   Ran Out of Food in the Last Year: Never true  Transportation Needs: No Transportation Needs   Lack of Transportation (Medical): No   Lack of Transportation (Non-Medical): No  Physical Activity: Sufficiently Active   Days of Exercise per Week: 5 days   Minutes of Exercise per Session: 30 min  Stress: Stress Concern Present   Feeling of Stress : To some extent  Social Connections: Moderately Isolated   Frequency of Communication with Friends and Family: More than three times a week   Frequency of Social Gatherings with Friends and Family: Three times a week   Attends Religious Services: Never   Active Member of Clubs or Organizations: No   Attends Archivist Meetings: Never   Marital Status: Married  Human resources officer Violence: Not At Risk   Fear of Current or Ex-Partner: No   Emotionally Abused: No   Physically Abused: No   Sexually Abused: No   Family History  Problem Relation Age of Onset   Cancer Mother        breast   Cancer Father        colon   ROS: All systems reviewed and negative except as per HPI.   Current Outpatient Medications  Medication Sig Dispense Refill   amiodarone (PACERONE) 200 MG tablet Take 100 mg by mouth daily at 12 noon.     Ascorbic Acid (VITAMIN C) 1000 MG tablet Take 1,000 mg by mouth daily.     atorvastatin (LIPITOR) 80 MG tablet TAKE 1 TABLET BY MOUTH ONCE DAILY AT  6PM. Please schedule yearly appointment for future refills. Thank you 30 tablet 0   calcium carbonate (TUMS - DOSED IN  MG ELEMENTAL CALCIUM) 500 MG chewable tablet Chew 500 mg by mouth daily.     DULoxetine (CYMBALTA) 60 MG capsule Take 1 capsule (60 mg total) by mouth daily. 90 capsule 3   furosemide (LASIX) 40 MG tablet TAKE 1 & 1/2 (ONE & ONE-HALF) TABLETS BY MOUTH ONCE DAILY . APPOINTMENT REQUIRED FOR FUTURE REFILLS 45 tablet 0   gabapentin (NEURONTIN) 300 MG capsule Take 1 capsule (300 mg total) by mouth 3 (three) times daily. 90 capsule 0   levothyroxine (SYNTHROID) 50 MCG tablet Take 1 tablet by mouth once  daily 90 tablet 0   midodrine (PROAMATINE) 2.5 MG tablet TAKE 1 TABLET BY MOUTH THREE TIMES DAILY WITH MEALS 90 tablet 0   nitroGLYCERIN (NITROSTAT) 0.4 MG SL tablet Place 1 tablet (0.4 mg total) under the tongue every 5 (five) minutes x 3 doses as needed for chest pain. 25 tablet 2   potassium chloride (KLOR-CON) 10 MEQ tablet Take 1 tablet (10 mEq total) by mouth daily. 90 tablet 0   primidone (MYSOLINE) 50 MG tablet Take 50 mg by mouth daily.     triamcinolone (KENALOG) 0.1 % Apply 1 application topically 2 (two) times daily. (Patient taking differently: Apply 1 application topically daily as needed (Rash).) 453.6 g 1   warfarin (COUMADIN) 5 MG tablet TAKE AS DIRECTED BY  COUMADIN  CLINIC 40 tablet 6   No current facility-administered medications for this visit.   Wt Readings from Last 3 Encounters:  03/01/21 75.1 kg (165 lb 9.6 oz)  01/10/21 77.9 kg (171 lb 11.2 oz)  12/21/20 75.8 kg (167 lb)   There were no vitals taken for this visit. PHYSICAL EXAM: ReDs Clip 30%  General:  Well appearing elderly WM. No respiratory difficulty HEENT: normal Neck: supple. no JVD. Carotids 2+ bilat; no bruits. No lymphadenopathy or thyromegaly appreciated. Cor: PMI nondisplaced. Irregularly irregular rhythm. No rubs, gallops or murmurs. Lungs: clear Abdomen: soft, nontender, nondistended. No hepatosplenomegaly. No bruits or masses. Good bowel sounds. Extremities: no cyanosis, clubbing, rash, edema Neuro:  alert & oriented x 3, cranial nerves grossly intact. moves all 4 extremities w/o difficulty. Affect pleasant.   Assessment/Plan: 1. 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  EF has fallen when he has been in atrial fibrillation. However, echo in 3/20 showed EF up to 55-60%.  NYHA III symptoms, likely due to general inactivity and deconditioning.  - Volume status good. Euvolemic on exam. Wt down 11 lb from last visit. ReDs Clip 30% - Continue Lasix 60 mg daily  - His Toprol was stopped 6/21 due to SB and frequent falls w/ rib fractures. - Stop lisinopril w/ low BP  - Check BMP today  2. Atrial fibrillation/ AFlutter: Paroxysmal.  He was unable to continue Tikosyn due to prolonged QT interval. In Atrial Flutter 2/22>>underwent DCCV 11/25/20 to NSR. Back in Aflutter on EKG today, HR controlled in the 70s. Asymptomatic. - We discussed reattempt at DCCV vs rate control strategy. Given lack of symptoms, he opts to continue w/ rate control. Continue amiodarone 200 mg daily. He will notify us if development of symptoms.  - Continue warfarin.  INRs followed by [facility] Coumadin [facility] (INR therapeutic yesterday at 3.0) 3. CKD: Stage 3.  Most recent SCr 2.1 - check BMP today  4. CAD: DES to LAD in 2/16.  Denies CP  - No ASA given stable CAD and warfarin use.  - Continue atorvastatin 80 mg daily. 5. Suspect OSA: Snoring. Sleep study, pending. 6. Orthostatic Hypotension: reports dizziness w/ positional changes. Orthostatic VS checked in clinic today and + (see above in HPI) - Stop lisinopril  - check CBC and BMP - advised to use caution w/ positional changes and add TED hoses   RN visit in 7-10 days to recheck orthostatics. F/u again w/ APP in 4-6 weeks.    Maricela Bo [facility]  PA-C  08/31/2021

## 2021-09-01 DIAGNOSIS — I480 Paroxysmal atrial fibrillation: Secondary | ICD-10-CM | POA: Diagnosis not present

## 2021-09-04 ENCOUNTER — Encounter (HOSPITAL_COMMUNITY): Payer: Medicare Other

## 2021-09-04 DIAGNOSIS — I480 Paroxysmal atrial fibrillation: Secondary | ICD-10-CM | POA: Diagnosis not present

## 2021-09-06 ENCOUNTER — Other Ambulatory Visit: Payer: Self-pay | Admitting: Family Medicine

## 2021-09-07 NOTE — Telephone Encounter (Signed)
Can you please send this in. Epic has it flagged as a controlled substance and will not let me send it in.

## 2021-09-30 ENCOUNTER — Other Ambulatory Visit: Payer: Self-pay | Admitting: Cardiovascular Disease

## 2021-09-30 DIAGNOSIS — I480 Paroxysmal atrial fibrillation: Secondary | ICD-10-CM

## 2021-11-29 ENCOUNTER — Ambulatory Visit: Payer: Medicare Other

## 2021-12-14 DIAGNOSIS — I480 Paroxysmal atrial fibrillation: Secondary | ICD-10-CM | POA: Diagnosis not present

## 2021-12-18 DIAGNOSIS — I480 Paroxysmal atrial fibrillation: Secondary | ICD-10-CM | POA: Diagnosis not present

## 2022-01-01 DIAGNOSIS — I480 Paroxysmal atrial fibrillation: Secondary | ICD-10-CM | POA: Diagnosis not present

## 2022-01-08 DIAGNOSIS — I480 Paroxysmal atrial fibrillation: Secondary | ICD-10-CM | POA: Diagnosis not present

## 2022-01-10 ENCOUNTER — Emergency Department: Admit: 2022-01-11 | Payer: MEDICARE | Primary: Family Medicine

## 2022-01-10 DIAGNOSIS — S2241XA Multiple fractures of ribs, right side, initial encounter for closed fracture: Secondary | ICD-10-CM

## 2022-01-10 DIAGNOSIS — S2231XA Fracture of one rib, right side, initial encounter for closed fracture: Secondary | ICD-10-CM | POA: Diagnosis not present

## 2022-01-10 DIAGNOSIS — E7800 Pure hypercholesterolemia, unspecified: Secondary | ICD-10-CM | POA: Diagnosis present

## 2022-01-10 DIAGNOSIS — D6869 Other thrombophilia: Secondary | ICD-10-CM | POA: Diagnosis present

## 2022-01-10 DIAGNOSIS — R5381 Other malaise: Secondary | ICD-10-CM | POA: Diagnosis present

## 2022-01-10 DIAGNOSIS — Z743 Need for continuous supervision: Secondary | ICD-10-CM | POA: Diagnosis not present

## 2022-01-10 DIAGNOSIS — I509 Heart failure, unspecified: Secondary | ICD-10-CM | POA: Diagnosis present

## 2022-01-10 DIAGNOSIS — Z79890 Hormone replacement therapy (postmenopausal): Secondary | ICD-10-CM | POA: Diagnosis not present

## 2022-01-10 DIAGNOSIS — Z79899 Other long term (current) drug therapy: Secondary | ICD-10-CM | POA: Diagnosis not present

## 2022-01-10 DIAGNOSIS — N179 Acute kidney failure, unspecified: Secondary | ICD-10-CM | POA: Diagnosis present

## 2022-01-10 DIAGNOSIS — Z7901 Long term (current) use of anticoagulants: Secondary | ICD-10-CM | POA: Diagnosis not present

## 2022-01-10 DIAGNOSIS — R109 Unspecified abdominal pain: Secondary | ICD-10-CM | POA: Diagnosis not present

## 2022-01-10 DIAGNOSIS — Z955 Presence of coronary angioplasty implant and graft: Secondary | ICD-10-CM | POA: Diagnosis not present

## 2022-01-10 DIAGNOSIS — M4800 Spinal stenosis, site unspecified: Secondary | ICD-10-CM | POA: Diagnosis present

## 2022-01-10 DIAGNOSIS — E039 Hypothyroidism, unspecified: Secondary | ICD-10-CM | POA: Diagnosis present

## 2022-01-10 DIAGNOSIS — I4891 Unspecified atrial fibrillation: Secondary | ICD-10-CM | POA: Diagnosis present

## 2022-01-10 DIAGNOSIS — S22089A Unspecified fracture of T11-T12 vertebra, initial encounter for closed fracture: Secondary | ICD-10-CM | POA: Diagnosis not present

## 2022-01-10 DIAGNOSIS — M47816 Spondylosis without myelopathy or radiculopathy, lumbar region: Secondary | ICD-10-CM | POA: Diagnosis not present

## 2022-01-10 DIAGNOSIS — T1490XA Injury, unspecified, initial encounter: Secondary | ICD-10-CM | POA: Diagnosis not present

## 2022-01-10 DIAGNOSIS — I480 Paroxysmal atrial fibrillation: Secondary | ICD-10-CM | POA: Diagnosis not present

## 2022-01-10 DIAGNOSIS — R55 Syncope and collapse: Secondary | ICD-10-CM | POA: Diagnosis not present

## 2022-01-10 DIAGNOSIS — M8448XA Pathological fracture, other site, initial encounter for fracture: Secondary | ICD-10-CM | POA: Diagnosis not present

## 2022-01-10 NOTE — ED Notes (Signed)
Patient here via EMS from home post GLF PTA. Spouse arrived at bedside to report the events leading up to the fall. Per spouse patient fell this morning and has been complaining of right side pain. This afternoon patient was ambulating to the bathroom with his walker, patient got to the bathroom door and couldn't go any further. Spouse slapped his face to wake him up unsure if patient had LOC. Patient is on warfarin for AFIB. When EMS got to the house initial BP was 83 systolic, improved to 123 when he was placed on the stretcher. Patient noted to have laceration to the face.

## 2022-01-10 NOTE — ED Notes (Signed)
Per patient spouse PCP is Dr. Cherlynn Kaiser  Cardiologist is Dr. Judi Cong

## 2022-01-10 NOTE — H&P (Signed)
H&P by Chester Holstein, MD at 01/10/22  2344                Author: Chester Holstein, MD  Service: Hospitalist  Author Type: Physician       Filed: 01/11/22 0040  Date of Service: 01/10/22 2344  Status: Signed          Editor: Chester Holstein, MD (Physician)                    Hospitalist Admission Note      NAME:  Mason Fields    DOB:  1928-09-26    MRN:  [id number]       Date/Time:  01/10/2022 11:44 PM      Patient PCP: None   ________________________________________________________________________      Given the patient's current clinical presentation, I have a high level of concern for decompensation if discharged from the emergency department.  Complex decision making was performed, which includes  reviewing the patient's available past medical records, laboratory results, and x-ray films.         My assessment of this patient's clinical condition and my plan of care is as follows.      Assessment / Plan:      1.  R T11/T12 rib fracture:      The patient comes in w/ fall earlier this morning resulting in severe back pain and is noted to have posterior rib fractures.      VSS   WBC normal, Hg 12   INR 7.9   BUN 20, Cr 1.81   CT Head - No acute process.  Chronic microvascular changes   CT Chest - displaced posterior rib fracture of R T11/T12 w/ small pleural effusion.  Age indeterminate vertebral compression fracture at T12/L1/L3      Admit and cont to monitor   Recommend to use incentive spiromtere q1h while awake   Continue w/ pain management w/ PO Norco and prn IV Morphine   Consult Ortho      2.  Supra therapeutic INR:      ER administered Vit K1 74m x1 IV      3.  AFIB:      Hold Coumadin   Cont w/ Amiodarone 102mdaily      4.  Hypothyroid:     Cont w/ Synthroid 5010mpo daily      Body mass index is 26.63 kg/m.:  25.0 - 29.9:  Overweight      I have personally reviewed the radiographs, laboratory data in Epic and decisions and statements above are based partially on this personal interpretation.      Code  Status: Full Code   [facility] [place])   [id number]   Prophylaxis:  No anticoagulation due to risk of bleeding         Subjective:     CHIEF COMPLAINT: Fall       HISTORY OF PRESENT ILLNESS:         The patient [age] a [age] M w/ PMH AFIB on Coumadin and CHF who sustained a fall earlier today.  He was walking through the back patio door when he tripped and fell.  He did not lose consciousness but did require the assistance of his wife to get up.   He began to experience right sided posterior rib pain which is a severe excruciating pain that is worse w/ any movement.  Later in the evening while he tried walking to the  bathroom he fell down again due to the intensity of his pain.  He has no chest  pain, palpitations, coughing, wheezing or dyspnea.          Past Medical History:        Diagnosis  Date         ?  A-fib ([place])       ?  CHF (congestive heart failure) ([place])       ?  Hypercholesteremia           ?  Hypothyroid             Past Surgical History:         Procedure  Laterality  Date          ?  HX CORONARY STENT PLACEMENT              10 years ago          Social History          Tobacco Use         ?  Smoking status:  Never              Passive exposure:  Never         ?  Smokeless tobacco:  Never       Substance Use Topics         ?  Alcohol use:  Not on file         History reviewed. No pertinent family history.       No Known Allergies         Prior to Admission medications             Medication  Sig  Start Date  End Date  Taking?  Authorizing Provider            furosemide (LASIX) 40 mg tablet  Take 1 Tablet by mouth daily.      Yes  Other, Phys, MD            DULoxetine (CYMBALTA) 60 mg capsule  Take 1 Capsule by mouth daily.      Yes  Other, Phys, MD     levothyroxine (SYNTHROID) 50 mcg tablet  Take  by mouth Daily (before breakfast).      Yes  Other, Phys, MD     amiodarone (CORDARONE) 200 mg tablet  Take 0.5 Tablets by mouth. Take 1/2 tab by mouth daily      Yes  Other, Phys, MD     atorvastatin (LIPITOR)  80 mg tablet  Take 1 Tablet by mouth daily.      Yes  Other, Phys, MD     warfarin (COUMADIN) 1 mg tablet  Take 7 Tablets by mouth daily. On tapering dose      Yes  Other, Phys, MD            gabapentin (NEURONTIN) 300 mg capsule  Take 1 Capsule by mouth three (3) times daily. Max Daily Amount: 900 mg.      Yes  Other, Phys, MD           REVIEW OF SYSTEMS:  See HPI for details   General: negative for fever, chills, sweats, weakness, weight loss   Eyes: negative for blurred vision, eye pain, loss of vision, diplopia   Ear Nose and Throat: negative for rhinorrhea, pharyngitis, otalgia, tinnitus, speech or swallowing difficulties   Respiratory:  negative for pleuritic pain, cough, sputum production, wheezing, SOB, DOE  Cardiology:  negative for chest pain, palpitations, orthopnea, PND, edema, syncope    Gastrointestinal: negative for abdominal pain, N/V, dysphagia, change in bowel habits, bleeding   Genitourinary: negative for frequency, urgency, dysuria, hematuria, incontinence   Muskuloskeletal : negative for arthralgia, myalgia, L posterior back pain   Hematology: negative for easy bruising, bleeding, lymphadenopathy   Dermatological: negative for rash, ulceration, mole change, new lesion   Endocrine: negative for hot flashes or polydipsia   Neurological: negative for headache, dizziness, confusion, focal weakness, paresthesia, memory loss, gait disturbance   Psychological: negative for anxiety, depression, agitation        Objective:     VITALS:     Visit Vitals      BP  (!) 145/79     Pulse  83     Temp  98 F (36.7 C)     Resp  17     Ht  '5\' 6"'  (1.676 m)     Wt  74.8 kg (165 lb)     SpO2  99%        BMI  26.63 kg/m        PHYSICAL EXAM:      Physical Exam:      Gen: Well-developed, well-nourished, in no acute distress   HEENT:  Pink conjunctivae, PERRL, hearing intact to voice, moist mucous membranes   Neck: Supple, without masses, thyroid non-tender   Resp: No accessory muscle use, clear breath sounds without  wheezes rales or rhonchi   Card: No murmurs, normal S1, S2 without thrills, bruits or peripheral edema   Abd:  Soft, non-tender, non-distended, normoactive bowel sounds are present, no palpable organomegaly and no detectable hernias   Lymph:  No cervical or inguinal adenopathy   Musc: No cyanosis or clubbing   Skin: No rashes or ulcers, skin turgor is good, multiple ecchymoses   Neuro:  Cranial nerves are grossly intact, no focal motor weakness, follows commands appropriately   Psych:  Good insight, oriented to person, place and time, alert   _______________________________________________________________________   Care Plan discussed with:  Pt's condition, Imaging findings, Lab findings, Assessment, and Care Plan discussed with: Patient   _______________________________________________________________________      Probable disposition:  Home   ________________________________________________________________________           Comments         >50% of visit spent in counseling and coordination of care    Chart reviewed   Discussion with patient and/or family and questions answered        ________________________________________________________________________   Signed: Chester Holstein, MD            Procedures: see electronic medical records for all procedures/Xrays and details which were not copied into this note but were reviewed prior to creation of Plan.      LAB DATA REVIEWED:       Recent Results (from the past 24 hour(s))     CBC WITH AUTOMATED DIFF          Collection Time: 01/10/22  9:02 PM         Result  Value  Ref Range            WBC  6.1  4.1 - 11.1 K/uL       RBC  3.39 (L)  4.10 - 5.70 M/uL       HGB  12.0 (L)  12.1 - 17.0 g/dL       HCT  32.7 (L)  36.6 - 50.3 %  MCV  96.5  80.0 - 99.0 FL       MCH  35.4 (H)  26.0 - 34.0 PG       MCHC  36.7 (H)  30.0 - 36.5 g/dL       RDW  18.9 (H)  11.5 - 14.5 %       PLATELET  138 (L)  150 - 400 K/uL       MPV  9.5  8.9 - 12.9 FL       NRBC  0.0  0 PER 100 WBC        ABSOLUTE NRBC  0.00  0.00 - 0.01 K/uL       NEUTROPHILS  73  32 - 75 %       LYMPHOCYTES  15  12 - 49 %       MONOCYTES  9  5 - 13 %       EOSINOPHILS  1  0 - 7 %       BASOPHILS  1  0 - 1 %       IMMATURE GRANULOCYTES  1 (H)  0.0 - 0.5 %       ABS. NEUTROPHILS  4.4  1.8 - 8.0 K/UL       ABS. LYMPHOCYTES  0.9  0.8 - 3.5 K/UL       ABS. MONOCYTES  0.6  0.0 - 1.0 K/UL       ABS. EOSINOPHILS  0.1  0.0 - 0.4 K/UL       ABS. BASOPHILS  0.1  0.0 - 0.1 K/UL       ABS. IMM. GRANS.  0.0  0.00 - 0.04 K/UL       DF  AUTOMATED          METABOLIC PANEL, COMPREHENSIVE          Collection Time: 01/10/22  9:02 PM         Result  Value  Ref Range            Sodium  135 (L)  136 - 145 mmol/L       Potassium  4.1  3.5 - 5.1 mmol/L       Chloride  104  97 - 108 mmol/L       CO2  25  21 - 32 mmol/L       Anion gap  6  5 - 15 mmol/L       Glucose  97  65 - 100 mg/dL       BUN  20  6 - 20 MG/DL       Creatinine  1.81 (H)  0.70 - 1.30 MG/DL       BUN/Creatinine ratio  11 (L)  12 - 20         eGFR  35 (L)  >60 ml/min/1.13m       Calcium  8.2 (L)  8.5 - 10.1 MG/DL       Bilirubin, total  0.7  0.2 - 1.0 MG/DL       ALT (SGPT)  16  12 - 78 U/L       AST (SGOT)  34  15 - 37 U/L       Alk. phosphatase  94  45 - 117 U/L       Protein, total  7.4  6.4 - 8.2 g/dL       Albumin  3.0 (L)  3.5 - 5.0 g/dL  Globulin  4.4 (H)  2.0 - 4.0 g/dL       A-G Ratio  0.7 (L)  1.1 - 2.2         PROTHROMBIN TIME + INR          Collection Time: 01/10/22  9:02 PM         Result  Value  Ref Range            INR  7.9 (HH)  0.9 - 1.1         Prothrombin time  72.0 (H)  9.0 - 11.1 sec       TROPONIN-HIGH SENSITIVITY          Collection Time: 01/10/22  9:02 PM         Result  Value  Ref Range            Troponin-High Sensitivity  67  0 - 76 ng/L       SAMPLES BEING HELD          Collection Time: 01/10/22  9:02 PM         Result  Value  Ref Range            SAMPLES BEING HELD  1SST         COMMENT                  Add-on orders for these samples will be processed  based on acceptable specimen integrity and analyte stability, which may vary by analyte.

## 2022-01-10 NOTE — ED Provider Notes (Signed)
ED  Provider Notes by Nena Polio, MD at 01/10/22 2024                Author: Nena Polio, MD  Service: Emergency Medicine  Author Type: Physician       Filed: 01/11/22 0022  Date of Service: 01/10/22 2024  Status: Signed          Editor: Nena Polio, MD (Physician)               EMERGENCY DEPARTMENT HISTORY AND PHYSICAL EXAM         Date: 01/10/2022   Patient Name: Mason Fields        History of Presenting Illness        HPI: Mason Fields, 88 y.o. male with pmhx of afib on coumadin, presents to the ED with cc of fall x 2. Patient reports falling once this morning  after tripping over a plant in his way. States he landed onto the R side of his body and has been experiencing significant pain over his R lateral chest/right flank region since. Tonight, patient fell again but cannot recall what made him fall or how  he fell. Wife reports observing patient walking to the bathroom when he seemed to slowly collapse down to the ground. She feels he may have lost consciousness, which led to the fall. States patient did hit his head, and was unconscious after the fall.  She had to slap his face to wake him up. Patient denies feeling lightheaded or dizzy prior to event. Denies chest pain, palpitations, sob. When ems arrived to his house, they noted that his initial BP while pt was laying supine was low with systolic of  83. This quickly improved to 123 when patient was sat up on the stretcher even without any other intervention. Patient currently complaining only of R sided lateral thorax/flank pain. No headache, neck pain, neurologic symptoms of concern. No loss in  ROM in any of his extremities. Patient has several open sores on his face and over his body as well which he clarifies are old from possible skin cancer which is being followed and worked up by dermatology. Wife points out a cut on his chin only from  fall tonight. He is unsure when he last had a tetanus shot.       PCP: None        No current  facility-administered medications on file prior to encounter.          Current Outpatient Medications on File Prior to Encounter          Medication  Sig  Dispense  Refill           ?  furosemide (LASIX) 40 mg tablet  Take 1 Tablet by mouth daily.         ?  DULoxetine (CYMBALTA) 60 mg capsule  Take 1 Capsule by mouth daily.         ?  levothyroxine (SYNTHROID) 50 mcg tablet  Take  by mouth Daily (before breakfast).         ?  amiodarone (CORDARONE) 200 mg tablet  Take 0.5 Tablets by mouth. Take 1/2 tab by mouth daily         ?  atorvastatin (LIPITOR) 80 mg tablet  Take 1 Tablet by mouth daily.         ?  warfarin (COUMADIN) 1 mg tablet  Take 7 Tablets by mouth daily. On tapering dose               ?  gabapentin (NEURONTIN) 300 mg capsule  Take 1 Capsule by mouth three (3) times daily. Max Daily Amount: 900 mg.                 Past History        Past Medical History:     Past Medical History:        Diagnosis  Date         ?  A-fib ([place])       ?  CHF (congestive heart failure) ([place])       ?  Hypercholesteremia           ?  Hypothyroid             Past Surgical History:     Past Surgical History:         Procedure  Laterality  Date          ?  HX CORONARY STENT PLACEMENT              10 years ago           Family History:   History reviewed. No pertinent family history.      Social History:     Social History          Tobacco Use         ?  Smoking status:  Never              Passive exposure:  Never         ?  Smokeless tobacco:  Never       Vaping Use         ?  Vaping Use:  Never used       Substance Use Topics         ?  Drug use:  Never           Allergies:   No Known Allergies           Review of Systems      Review of Systems    All other systems reviewed and are negative.        Physical Exam     Physical Exam   Vitals and nursing note reviewed.    Constitutional:        General: He is not in acute distress.      Appearance: Normal appearance. He is not ill-appearing or toxic-appearing.    HENT:       Head:  Normocephalic and atraumatic.       Jaw: There is normal jaw occlusion.       Comments: Several chronic appearing open wounds - 1 on L forehead, 1 below L ear, 1 on L lower neck. Only 1 fresh small superficial laceration on chin, v shaped, approximately 0.5 cm x 0.5cm without  active bleeding from injury tonight      Nose: Nose normal.       Mouth/Throat:       Mouth: Mucous membranes are moist.    Eyes:       Extraocular Movements: Extraocular movements intact.       Pupils: Pupils are equal, round, and reactive to light.     Cardiovascular:       Rate and Rhythm: Normal rate. Rhythm regularly irregular.       Pulses: Normal pulses.    Pulmonary:       Effort: Pulmonary effort is normal. No respiratory distress.       Breath sounds: Normal  breath sounds. No stridor. No wheezing or rhonchi.    Chest:            Abdominal:       General: Abdomen is flat. There is no distension.       Palpations: There is no mass.       Tenderness: There is no abdominal tenderness.             Musculoskeletal:          General: Normal range of motion.       Cervical back: Normal, full passive range of motion without pain, normal range of motion and neck supple. No rigidity. No pain with movement, spinous process tenderness or muscular tenderness. Normal range of motion.       Thoracic back: Normal.       Lumbar back: Normal.       Right hip: Normal.       Left hip: Normal.    Skin:      General: Skin is warm and dry.    Neurological:       General: No focal deficit present.       Mental Status: He is alert. Mental status is at baseline.       Sensory: No sensory deficit.       Motor: No weakness.            Diagnostic Study Results        Labs -         Recent Results (from the past 24 hour(s))     CBC WITH AUTOMATED DIFF          Collection Time: 01/10/22  9:02 PM         Result  Value  Ref Range            WBC  6.1  4.1 - 11.1 K/uL       RBC  3.39 (L)  4.10 - 5.70 M/uL       HGB  12.0 (L)  12.1 - 17.0 g/dL       HCT  32.7 (L)  36.6  - 50.3 %       MCV  96.5  80.0 - 99.0 FL       MCH  35.4 (H)  26.0 - 34.0 PG       MCHC  36.7 (H)  30.0 - 36.5 g/dL       RDW  18.9 (H)  11.5 - 14.5 %       PLATELET  138 (L)  150 - 400 K/uL       MPV  9.5  8.9 - 12.9 FL       NRBC  0.0  0 PER 100 WBC       ABSOLUTE NRBC  0.00  0.00 - 0.01 K/uL       NEUTROPHILS  73  32 - 75 %       LYMPHOCYTES  15  12 - 49 %       MONOCYTES  9  5 - 13 %       EOSINOPHILS  1  0 - 7 %       BASOPHILS  1  0 - 1 %       IMMATURE GRANULOCYTES  1 (H)  0.0 - 0.5 %       ABS. NEUTROPHILS  4.4  1.8 - 8.0 K/UL       ABS. LYMPHOCYTES  0.9  0.8 - 3.5  K/UL       ABS. MONOCYTES  0.6  0.0 - 1.0 K/UL       ABS. EOSINOPHILS  0.1  0.0 - 0.4 K/UL       ABS. BASOPHILS  0.1  0.0 - 0.1 K/UL       ABS. IMM. GRANS.  0.0  0.00 - 0.04 K/UL       DF  AUTOMATED          METABOLIC PANEL, COMPREHENSIVE          Collection Time: 01/10/22  9:02 PM         Result  Value  Ref Range            Sodium  135 (L)  136 - 145 mmol/L       Potassium  4.1  3.5 - 5.1 mmol/L       Chloride  104  97 - 108 mmol/L       CO2  25  21 - 32 mmol/L       Anion gap  6  5 - 15 mmol/L       Glucose  97  65 - 100 mg/dL       BUN  20  6 - 20 MG/DL       Creatinine  1.81 (H)  0.70 - 1.30 MG/DL       BUN/Creatinine ratio  11 (L)  12 - 20         eGFR  35 (L)  >60 ml/min/1.39m       Calcium  8.2 (L)  8.5 - 10.1 MG/DL       Bilirubin, total  0.7  0.2 - 1.0 MG/DL       ALT (SGPT)  16  12 - 78 U/L       AST (SGOT)  34  15 - 37 U/L       Alk. phosphatase  94  45 - 117 U/L       Protein, total  7.4  6.4 - 8.2 g/dL       Albumin  3.0 (L)  3.5 - 5.0 g/dL       Globulin  4.4 (H)  2.0 - 4.0 g/dL       A-G Ratio  0.7 (L)  1.1 - 2.2         PROTHROMBIN TIME + INR          Collection Time: 01/10/22  9:02 PM         Result  Value  Ref Range            INR  7.9 (HH)  0.9 - 1.1         Prothrombin time  72.0 (H)  9.0 - 11.1 sec       TROPONIN-HIGH SENSITIVITY          Collection Time: 01/10/22  9:02 PM         Result  Value  Ref Range             Troponin-High Sensitivity  67  0 - 76 ng/L       SAMPLES BEING HELD          Collection Time: 01/10/22  9:02 PM         Result  Value  Ref Range            SAMPLES BEING HELD  1SST         COMMENT  Add-on orders for these samples will be processed based on acceptable specimen integrity and analyte stability, which may vary by analyte.           Radiologic Studies -      CT HEAD WO CONT       Final Result     No acute intracranial process.          Imaging findings consistent with minimal chronic microvascular ischemic change.     There is a mild degree of cerebral atrophy.          Clinical history: fall onto head, syncope, on coumadin     INDICATION:   fall onto head, syncope, on coumadin     COMPARISON:  None          TECHNIQUE:      Thin axial images were obtained through the chest, abdomen and pelvis. Coronal     and sagittal reconstructions were generated. Oral contrast was not administered.               CT dose reduction was achieved through use of a standardized protocol tailored     for this examination and automatic exposure control for dose modulation.     Adaptive statistical iterative reconstruction (ASIR) was utilized.          FINDINGS:           SUPRACLAVICULAR REGION: No supraclavicular adenopathy.     MEDIASTINUM:          There is cardiomegaly. Aortic atherosclerotic change. Small hiatal hernia.     CORONARY ARTERY CALCIUM: Present.  No hilar or mediastinal lymphadenopathy. No     esophageal mass. No endotracheal or endobronchial mass.     PLEURA/LUNGS:: Right-sided greater than left-sided pleural effusion. Chronic     pleural convex on the right. Posterior rib fractures at T12 and T11.          SOFT TISSUE/ AXILLA: No axillary adenopathy. No chest wall mass.     LIVER/GALLBLADDER: Hepatic cyst There is no intrahepatic duct dilatation. The     CBD is not dilated.      SPLEEN/PANCREAS: No mass.. There is no pancreatic mass. There is no pancreatic     duct dilatation. There is no  evidence of splenomegaly.      ADRENALS/KIDNEYS: No mass, calculus, or hydronephrosis.. There is no adrenal     mass. There is no hydroureter or hydronephrosis. There is no perinephric mass.     No ureteral or bladder calculus.     STOMACH, COLON AND SMALL BOWEL: Fecal stasis. Colonic diverticulosis. There is     no obstruction or free intraperitoneal air. There is no evidence of     incarceration or obstruction. No mesenteric adenopathy. No retroperitoneal     adenopathy.     APPENDIX: Unremarkable.     PERITONEUM/RETROPERITONEUM: No mass or calculus. There is no abdominal aortic     aneurysm.      URINARY BLADDER: Bladder distention with bladder diverticulum.     PELVIS: Unremarkable. There is no pelvic sidewall mass. There is no inguinal     adenopathy.     BONES: Right posterior rib fractures at T12 and T11. Vertebral compression     fractures at T12-L1 and L3 are age indeterminate. Left T8 rib fracture is likely     chronic in nature.. No lytic or blastic lesions.           IMPRESSION:     Displaced posterior rib fractures  on the right at T11 and T12 are associated     with a small right-sided pleural effusion.          Age indeterminate vertebral compression fractures at T12-L1 and L3.          Chronic right rib fracture.          No other fracture or dislocation.     Bladder distention with bladder diverticulum.     Incidental and/or nonemergent findings are as described in detail above.            CT CHEST ABD PELV WO CONT       Final Result     No acute intracranial process.          Imaging findings consistent with minimal chronic microvascular ischemic change.     There is a mild degree of cerebral atrophy.          Clinical history: fall onto head, syncope, on coumadin     INDICATION:   fall onto head, syncope, on coumadin     COMPARISON:  None          TECHNIQUE:      Thin axial images were obtained through the chest, abdomen and pelvis. Coronal     and sagittal reconstructions were generated. Oral  contrast was not administered.               CT dose reduction was achieved through use of a standardized protocol tailored     for this examination and automatic exposure control for dose modulation.     Adaptive statistical iterative reconstruction (ASIR) was utilized.          FINDINGS:           SUPRACLAVICULAR REGION: No supraclavicular adenopathy.     MEDIASTINUM:          There is cardiomegaly. Aortic atherosclerotic change. Small hiatal hernia.     CORONARY ARTERY CALCIUM: Present.  No hilar or mediastinal lymphadenopathy. No     esophageal mass. No endotracheal or endobronchial mass.     PLEURA/LUNGS:: Right-sided greater than left-sided pleural effusion. Chronic     pleural convex on the right. Posterior rib fractures at T12 and T11.          SOFT TISSUE/ AXILLA: No axillary adenopathy. No chest wall mass.     LIVER/GALLBLADDER: Hepatic cyst There is no intrahepatic duct dilatation. The     CBD is not dilated.      SPLEEN/PANCREAS: No mass.. There is no pancreatic mass. There is no pancreatic     duct dilatation. There is no evidence of splenomegaly.      ADRENALS/KIDNEYS: No mass, calculus, or hydronephrosis.. There is no adrenal     mass. There is no hydroureter or hydronephrosis. There is no perinephric mass.     No ureteral or bladder calculus.     STOMACH, COLON AND SMALL BOWEL: Fecal stasis. Colonic diverticulosis. There is     no obstruction or free intraperitoneal air. There is no evidence of     incarceration or obstruction. No mesenteric adenopathy. No retroperitoneal     adenopathy.     APPENDIX: Unremarkable.     PERITONEUM/RETROPERITONEUM: No mass or calculus. There is no abdominal aortic     aneurysm.      URINARY BLADDER: Bladder distention with bladder diverticulum.     PELVIS: Unremarkable. There is no pelvic sidewall mass. There is no inguinal     adenopathy.  BONES: Right posterior rib fractures at T12 and T11. Vertebral compression     fractures at T12-L1 and L3 are age  indeterminate. Left T8 rib fracture is likely     chronic in nature.. No lytic or blastic lesions.           IMPRESSION:     Displaced posterior rib fractures on the right at T11 and T12 are associated     with a small right-sided pleural effusion.          Age indeterminate vertebral compression fractures at T12-L1 and L3.          Chronic right rib fracture.          No other fracture or dislocation.     Bladder distention with bladder diverticulum.     Incidental and/or nonemergent findings are as described in detail above.                 CT Results  (Last 48 hours)                                       01/10/22 2210    CT HEAD WO CONT  Final result            Impression:    No acute intracranial process.             Imaging findings consistent with minimal chronic microvascular ischemic change.      There is a mild degree of cerebral atrophy.             Clinical history: fall onto head, syncope, on coumadin      INDICATION:   fall onto head, syncope, on coumadin      COMPARISON:  None             TECHNIQUE:       Thin axial images were obtained through the chest, abdomen and pelvis. Coronal      and sagittal reconstructions were generated. Oral contrast was not administered.                    CT dose reduction was achieved through use of a standardized protocol tailored      for this examination and automatic exposure control for dose modulation.      Adaptive statistical iterative reconstruction (ASIR) was utilized.             FINDINGS:              SUPRACLAVICULAR REGION: No supraclavicular adenopathy.      MEDIASTINUM:             There is cardiomegaly. Aortic atherosclerotic change. Small hiatal hernia.      CORONARY ARTERY CALCIUM: Present.  No hilar or mediastinal lymphadenopathy. No      esophageal mass. No endotracheal or endobronchial mass.      PLEURA/LUNGS:: Right-sided greater than left-sided pleural effusion. Chronic      pleural convex on the right. Posterior rib fractures at T12 and T11.              SOFT TISSUE/ AXILLA: No axillary adenopathy. No chest wall mass.      LIVER/GALLBLADDER: Hepatic cyst There is no intrahepatic duct dilatation. The      CBD is not dilated.       SPLEEN/PANCREAS: No mass.. There is no pancreatic mass. There is no pancreatic  duct dilatation. There is no evidence of splenomegaly.       ADRENALS/KIDNEYS: No mass, calculus, or hydronephrosis.. There is no adrenal      mass. There is no hydroureter or hydronephrosis. There is no perinephric mass.      No ureteral or bladder calculus.      STOMACH, COLON AND SMALL BOWEL: Fecal stasis. Colonic diverticulosis. There is      no obstruction or free intraperitoneal air. There is no evidence of      incarceration or obstruction. No mesenteric adenopathy. No retroperitoneal      adenopathy.      APPENDIX: Unremarkable.      PERITONEUM/RETROPERITONEUM: No mass or calculus. There is no abdominal aortic      aneurysm.       URINARY BLADDER: Bladder distention with bladder diverticulum.      PELVIS: Unremarkable. There is no pelvic sidewall mass. There is no inguinal      adenopathy.      BONES: Right posterior rib fractures at T12 and T11. Vertebral compression      fractures at T12-L1 and L3 are age indeterminate. Left T8 rib fracture is likely      chronic in nature.. No lytic or blastic lesions.              IMPRESSION:      Displaced posterior rib fractures on the right at T11 and T12 are associated      with a small right-sided pleural effusion.             Age indeterminate vertebral compression fractures at T12-L1 and L3.             Chronic right rib fracture.             No other fracture or dislocation.      Bladder distention with bladder diverticulum.      Incidental and/or nonemergent findings are as described in detail above.                       Narrative:    CLINICAL HISTORY: fall onto head, syncope, on coumadin      INDICATION: fall onto head, syncope, on coumadin      COMPARISON: None.      CT dose reduction was  achieved through use of a standardized protocol tailored      for this examination and automatic exposure control for dose modulation.       TECHNIQUE: Serial axial images with a collimation of 5 mm were obtained from the      skull base through the vertex        FINDINGS:       There is sulcal and ventricular prominence. Confluent periventricular and      scattered foci of hypodensity in the cerebral white matter. There is no evidence      of an acute infarction, hemorrhage, or mass-effect. There is no evidence of      midline shift or hydrocephalus. Posterior fossa structures are unremarkable. No      extra-axial collections are seen.      Mastoid air cells are well pneumatized and clear.        Vertebral vascular calcifications.                               01/10/22 2210    CT CHEST ABD PELV WO CONT  Final  result            Impression:    No acute intracranial process.             Imaging findings consistent with minimal chronic microvascular ischemic change.      There is a mild degree of cerebral atrophy.             Clinical history: fall onto head, syncope, on coumadin      INDICATION:   fall onto head, syncope, on coumadin      COMPARISON:  None             TECHNIQUE:       Thin axial images were obtained through the chest, abdomen and pelvis. Coronal      and sagittal reconstructions were generated. Oral contrast was not administered.                    CT dose reduction was achieved through use of a standardized protocol tailored      for this examination and automatic exposure control for dose modulation.      Adaptive statistical iterative reconstruction (ASIR) was utilized.             FINDINGS:              SUPRACLAVICULAR REGION: No supraclavicular adenopathy.      MEDIASTINUM:             There is cardiomegaly. Aortic atherosclerotic change. Small hiatal hernia.      CORONARY ARTERY CALCIUM: Present.  No hilar or mediastinal lymphadenopathy. No      esophageal mass. No endotracheal or endobronchial  mass.      PLEURA/LUNGS:: Right-sided greater than left-sided pleural effusion. Chronic      pleural convex on the right. Posterior rib fractures at T12 and T11.             SOFT TISSUE/ AXILLA: No axillary adenopathy. No chest wall mass.      LIVER/GALLBLADDER: Hepatic cyst There is no intrahepatic duct dilatation. The      CBD is not dilated.       SPLEEN/PANCREAS: No mass.. There is no pancreatic mass. There is no pancreatic      duct dilatation. There is no evidence of splenomegaly.       ADRENALS/KIDNEYS: No mass, calculus, or hydronephrosis.. There is no adrenal      mass. There is no hydroureter or hydronephrosis. There is no perinephric mass.      No ureteral or bladder calculus.      STOMACH, COLON AND SMALL BOWEL: Fecal stasis. Colonic diverticulosis. There is      no obstruction or free intraperitoneal air. There is no evidence of      incarceration or obstruction. No mesenteric adenopathy. No retroperitoneal      adenopathy.      APPENDIX: Unremarkable.      PERITONEUM/RETROPERITONEUM: No mass or calculus. There is no abdominal aortic      aneurysm.       URINARY BLADDER: Bladder distention with bladder diverticulum.      PELVIS: Unremarkable. There is no pelvic sidewall mass. There is no inguinal      adenopathy.      BONES: Right posterior rib fractures at T12 and T11. Vertebral compression      fractures at T12-L1 and L3 are age indeterminate. Left T8 rib fracture is likely      chronic in nature.. No lytic or blastic lesions.  IMPRESSION:      Displaced posterior rib fractures on the right at T11 and T12 are associated      with a small right-sided pleural effusion.             Age indeterminate vertebral compression fractures at T12-L1 and L3.             Chronic right rib fracture.             No other fracture or dislocation.      Bladder distention with bladder diverticulum.      Incidental and/or nonemergent findings are as described in detail above.                       Narrative:     CLINICAL HISTORY: fall onto head, syncope, on coumadin      INDICATION: fall onto head, syncope, on coumadin      COMPARISON: None.      CT dose reduction was achieved through use of a standardized protocol tailored      for this examination and automatic exposure control for dose modulation.       TECHNIQUE: Serial axial images with a collimation of 5 mm were obtained from the      skull base through the vertex        FINDINGS:       There is sulcal and ventricular prominence. Confluent periventricular and      scattered foci of hypodensity in the cerebral white matter. There is no evidence      of an acute infarction, hemorrhage, or mass-effect. There is no evidence of      midline shift or hydrocephalus. Posterior fossa structures are unremarkable. No      extra-axial collections are seen.      Mastoid air cells are well pneumatized and clear.        Vertebral vascular calcifications.                                      CXR Results  (Last 48 hours)             None                       Medical Decision Making     I, Nena Polio, MD-- am the first provider for this patient, and I am the attending of record for this patient encounter.      I reviewed the vital signs, available nursing notes, past medical history, past surgical history, family history and social history.      Vital Signs-Reviewed the patient's vital signs.   Patient Vitals for the past 24 hrs:            Temp  Pulse  Resp  BP  SpO2            01/10/22 2313  --  83  17  (!) 145/79  99 %            01/10/22 2310  --  89  --  --  --     01/10/22 2124  --  81  17  (!) 143/83  96 %     01/10/22 2109  --  78  16  138/62  98 %     01/10/22 2054  --  80  19  137/70  99 %  01/10/22 2039  --  87  20  (!) 141/71  99 %            01/10/22 2036  98 F (36.7 C)  84  18  130/70  99 %           Records Reviewed: Prior medical records and Nursing notes      Provider Notes (Medical Decision Making):    88 yo M presenting for evaluation s/p fall x 2 today.  Initial fall mechanical after tripping over plant. Second fall occurring just PTA, possibly associated LOC prior to episode, and with head trauma.  Currently complaining of R sided pain.       Will check ekg, cbc, cmp, troponin, pt/inr, UA, ct head, ct chest/abd/pelvis. Will administer small dose of pain medication, update Td. Will close small superficial chin laceration with dermabond.       Procedure Note - Laceration Repair:   11:30 PM   Procedure by Nena Polio, MD .   Complexity: simple   0.5 cm x 0.5 cm v-shaped laceration to chin was irrigated copiously with NS under jet lavage.  The wound was explored with the following results: No foreign bodies found.  The wound was repaired with Dermabond.  The wound was closed with good hemostasis  and approximation.  Sterile dressing applied.   Estimated blood loss: 0   The procedure took 1-15 minutes, and pt tolerated well.         ED Course as of 01/11/22 0017       Wed Jan 10, 2022        2130  EKG at 20:46-- Aflutter with variable AV block, 82 bpm, nonspecific ST changes without STEMI. Qtc normal. EKG interpretation by Nena Polio, MD    [JM]     2205  INR is supratherapeutic at 7.9. Hgb stable at 12. Cr increased at 1.81, BUN 20. No prior for comparison.   [JM]     2301  CT show: Displaced posterior rib fractures on the right at T11 and T12 are associated   with a small right-sided pleural effusion.       Age indeterminate vertebral compression fractures at T12-L1 and L3.       Chronic right rib fracture. [JM]        2301  Will administer vitamin K considering supratherapeutic INR, displaced rib fractures and small pleural effusion seen on CT. Will admit for observation,  serial H&H considering elevated INR and recent trauma, pain management.       Results reviewed with the patient. He tells me the vertebral compression fractures are old and pt had no reproducible pain in the distribution of the T/L spine on exam.      I called patient's wife, and updated her on his  ER findings today, along with plans for hospitalization.  [JM]        [place] for Admission   11:27 PM      ED Room Number: ER02/02   Patient Name and age:  Mason Fields 88 y.o.  male   Working Diagnosis: (S22.41XA) Closed fracture of multiple ribs of right side, initial encounter  (primary encounter diagnosis)   (J90) Small pleural effusion   (R79.1) Supratherapeutic INR         COVID-19 Suspicion:  no   Sepsis present:  no  Reassessment needed: yes   Code Status:  Full Code   Readmission: no   Isolation Requirements:  no  Recommended Level of Care:  telemetry   Department: [facility] - [phone]   Admitting Provider: Dr.Omar Genene Churn      Other:  fall x 2, on coumadin for hx of afib. INR of 7.9, will reverse with vitamin K. CT show 2 posterior displaced rib fxs associated with small pleural effusion. Hgb 12, no prior for comparison. HD stable. SpO2 normal, no SOB.     [JM]              ED Course User Index   [JM] Nena Polio, MD        ED Course:    Initial assessment performed. The patient's presenting problems have been discussed, and they are in agreement with the care plan formulated and outlined with them.  I have encouraged them to ask questions as they arise throughout their visit.      Nena Polio, MD         Disposition:   Admit        Diagnosis        Clinical Impression:       1.  Closed fracture of multiple ribs of right side, initial encounter      2.  Small pleural effusion      3.  Supratherapeutic INR      4.  Fall, initial encounter      5.  Chin laceration, initial encounter         6.  Elevated serum creatinine            Attestations:      Nena Polio, MD      Please note that this dictation was completed with Dragon, the computer voice recognition software.  Quite often unanticipated grammatical, syntax, homophones, and other interpretive errors are inadvertently  transcribed by the computer software.  Please disregard these errors.  Please excuse any errors that  have escaped final proofreading.  Thank you.

## 2022-01-11 ENCOUNTER — Inpatient Hospital Stay
Admit: 2022-01-11 | Discharge: 2022-01-12 | Disposition: A | Discharge status: Home Health | Payer: MEDICARE | Attending: Internal Medicine | Admitting: Internal Medicine

## 2022-01-11 ENCOUNTER — Inpatient Hospital Stay: Admit: 2022-01-11 | Payer: MEDICARE | Primary: Family Medicine

## 2022-01-11 DIAGNOSIS — E039 Hypothyroidism, unspecified: Secondary | ICD-10-CM | POA: Diagnosis present

## 2022-01-11 DIAGNOSIS — R5381 Other malaise: Secondary | ICD-10-CM | POA: Diagnosis present

## 2022-01-11 DIAGNOSIS — Z955 Presence of coronary angioplasty implant and graft: Secondary | ICD-10-CM | POA: Diagnosis not present

## 2022-01-11 DIAGNOSIS — S2241XA Multiple fractures of ribs, right side, initial encounter for closed fracture: Secondary | ICD-10-CM | POA: Diagnosis present

## 2022-01-11 DIAGNOSIS — N179 Acute kidney failure, unspecified: Secondary | ICD-10-CM | POA: Diagnosis present

## 2022-01-11 DIAGNOSIS — Z79890 Hormone replacement therapy (postmenopausal): Secondary | ICD-10-CM | POA: Diagnosis not present

## 2022-01-11 DIAGNOSIS — M8448XA Pathological fracture, other site, initial encounter for fracture: Secondary | ICD-10-CM | POA: Diagnosis not present

## 2022-01-11 DIAGNOSIS — D6869 Other thrombophilia: Secondary | ICD-10-CM | POA: Diagnosis present

## 2022-01-11 DIAGNOSIS — I509 Heart failure, unspecified: Secondary | ICD-10-CM | POA: Diagnosis present

## 2022-01-11 DIAGNOSIS — I4891 Unspecified atrial fibrillation: Secondary | ICD-10-CM | POA: Diagnosis present

## 2022-01-11 DIAGNOSIS — E7800 Pure hypercholesterolemia, unspecified: Secondary | ICD-10-CM | POA: Diagnosis present

## 2022-01-11 DIAGNOSIS — Z79899 Other long term (current) drug therapy: Secondary | ICD-10-CM | POA: Diagnosis not present

## 2022-01-11 DIAGNOSIS — M47816 Spondylosis without myelopathy or radiculopathy, lumbar region: Secondary | ICD-10-CM | POA: Diagnosis not present

## 2022-01-11 DIAGNOSIS — Z7901 Long term (current) use of anticoagulants: Secondary | ICD-10-CM | POA: Diagnosis not present

## 2022-01-11 DIAGNOSIS — S22089A Unspecified fracture of T11-T12 vertebra, initial encounter for closed fracture: Secondary | ICD-10-CM | POA: Diagnosis not present

## 2022-01-11 DIAGNOSIS — M4800 Spinal stenosis, site unspecified: Secondary | ICD-10-CM | POA: Diagnosis present

## 2022-01-11 LAB — EKG, 12 LEAD, INITIAL
Atrial Rate: 344 {beats}/min
Calculated T Axis: 117 degrees
Q-T Interval: 438 ms
QRS Duration: 104 ms
QTC Calculation (Bezet): 511 ms
Ventricular Rate: 82 {beats}/min

## 2022-01-11 LAB — URINALYSIS W/ REFLEX CULTURE
BACTERIA, URINE: NEGATIVE /hpf
Bacteria: NEGATIVE /hpf
Bilirubin, Urine: NEGATIVE
Bilirubin: NEGATIVE
Blood, Urine: NEGATIVE
Blood: NEGATIVE
Glucose, Ur: NEGATIVE mg/dL
Glucose: NEGATIVE mg/dL
Ketone: NEGATIVE mg/dL
Ketones, Urine: NEGATIVE mg/dL
Leukocyte Esterase, Urine: NEGATIVE
Leukocyte Esterase: NEGATIVE
Nitrite, Urine: NEGATIVE
Nitrites: NEGATIVE
Protein, UA: NEGATIVE mg/dL
Protein: NEGATIVE mg/dL
Specific Gravity, UA: 1.012 (ref 1.003–1.030)
Specific gravity: 1.012 (ref 1.003–1.030)
Urobilinogen, UA, POCT: 1 EU/dL (ref 0.2–1.0)
Urobilinogen: 1 EU/dL (ref 0.2–1.0)
pH (UA): 6.5 (ref 5.0–8.0)
pH, UA: 6.5 (ref 5.0–8.0)

## 2022-01-11 LAB — CBC WITH AUTOMATED DIFF
ABS. BASOPHILS: 0.1 10*3/uL (ref 0.0–0.1)
ABS. EOSINOPHILS: 0.1 10*3/uL (ref 0.0–0.4)
ABS. IMM. GRANS.: 0 10*3/uL (ref 0.00–0.04)
ABS. LYMPHOCYTES: 0.9 10*3/uL (ref 0.8–3.5)
ABS. MONOCYTES: 0.6 10*3/uL (ref 0.0–1.0)
ABS. NEUTROPHILS: 4.4 10*3/uL (ref 1.8–8.0)
ABSOLUTE NRBC: 0 10*3/uL (ref 0.00–0.01)
BASOPHILS: 1 % (ref 0–1)
EOSINOPHILS: 1 % (ref 0–7)
HCT: 32.7 % — ABNORMAL LOW (ref 36.6–50.3)
HGB: 12 g/dL — ABNORMAL LOW (ref 12.1–17.0)
IMMATURE GRANULOCYTES: 1 % — ABNORMAL HIGH (ref 0.0–0.5)
LYMPHOCYTES: 15 % (ref 12–49)
MCH: 35.4 PG — ABNORMAL HIGH (ref 26.0–34.0)
MCHC: 36.7 g/dL — ABNORMAL HIGH (ref 30.0–36.5)
MCV: 96.5 FL (ref 80.0–99.0)
MPV: 9.5 FL (ref 8.9–12.9)
NEUTROPHILS: 73 % (ref 32–75)
NRBC: 0 PER 100 WBC
PLATELET: 138 10*3/uL — ABNORMAL LOW (ref 150–400)
RBC: 3.39 M/uL — ABNORMAL LOW (ref 4.10–5.70)
RDW: 18.9 % — ABNORMAL HIGH (ref 11.5–14.5)
WBC: 6.1 10*3/uL (ref 4.1–11.1)

## 2022-01-11 LAB — CBC W/O DIFF
ABSOLUTE NRBC: 0 10*3/uL (ref 0.00–0.01)
HCT: 33.8 % — ABNORMAL LOW (ref 36.6–50.3)
HGB: 11 g/dL — ABNORMAL LOW (ref 12.1–17.0)
MCH: 29.6 PG (ref 26.0–34.0)
MCHC: 32.5 g/dL (ref 30.0–36.5)
MCV: 91.1 FL (ref 80.0–99.0)
MPV: 9.2 FL (ref 8.9–12.9)
NRBC: 0 PER 100 WBC
PLATELET: 143 10*3/uL — ABNORMAL LOW (ref 150–400)
RBC: 3.71 M/uL — ABNORMAL LOW (ref 4.10–5.70)
RDW: 16.8 % — ABNORMAL HIGH (ref 11.5–14.5)
WBC: 6 10*3/uL (ref 4.1–11.1)

## 2022-01-11 LAB — TROPONIN-HIGH SENSITIVITY: Troponin-High Sensitivity: 67 ng/L (ref 0–76)

## 2022-01-11 LAB — PROTHROMBIN TIME + INR
INR: 7.9 — CR (ref 0.9–1.1)
INR: 3.7 — ABNORMAL HIGH (ref 0.9–1.1)
Prothrombin time: 72 s — ABNORMAL HIGH (ref 9.0–11.1)
Prothrombin time: 35.7 s — ABNORMAL HIGH (ref 9.0–11.1)

## 2022-01-11 LAB — METABOLIC PANEL, COMPREHENSIVE
A-G Ratio: 0.7 — ABNORMAL LOW (ref 1.1–2.2)
ALT (SGPT): 16 U/L (ref 12–78)
AST (SGOT): 34 U/L (ref 15–37)
Albumin: 3 g/dL — ABNORMAL LOW (ref 3.5–5.0)
Alk. phosphatase: 94 U/L (ref 45–117)
Anion gap: 6 mmol/L (ref 5–15)
BUN/Creatinine ratio: 11 — ABNORMAL LOW (ref 12–20)
BUN: 20 MG/DL (ref 6–20)
Bilirubin, total: 0.7 MG/DL (ref 0.2–1.0)
CO2: 25 mmol/L (ref 21–32)
Calcium: 8.2 MG/DL — ABNORMAL LOW (ref 8.5–10.1)
Chloride: 104 mmol/L (ref 97–108)
Creatinine: 1.81 MG/DL — ABNORMAL HIGH (ref 0.70–1.30)
Globulin: 4.4 g/dL — ABNORMAL HIGH (ref 2.0–4.0)
Glucose: 97 mg/dL (ref 65–100)
Potassium: 4.1 mmol/L (ref 3.5–5.1)
Protein, total: 7.4 g/dL (ref 6.4–8.2)
Sodium: 135 mmol/L — ABNORMAL LOW (ref 136–145)
eGFR: 35 mL/min/{1.73_m2} — ABNORMAL LOW (ref >60)

## 2022-01-11 LAB — SAMPLES BEING HELD

## 2022-01-11 LAB — CBC WITH AUTO DIFFERENTIAL
Basophils %: 1 % (ref 0–1)
Basophils Absolute: 0.1 10*3/uL (ref 0.0–0.1)
Eosinophils %: 1 % (ref 0–7)
Eosinophils Absolute: 0.1 10*3/uL (ref 0.0–0.4)
Granulocyte Absolute Count: 0 10*3/uL (ref 0.00–0.04)
Hematocrit: 32.7 % — ABNORMAL LOW (ref 36.6–50.3)
Hemoglobin: 12 g/dL — ABNORMAL LOW (ref 12.1–17.0)
Immature Granulocytes: 1 % — ABNORMAL HIGH (ref 0.0–0.5)
Lymphocytes %: 15 % (ref 12–49)
Lymphocytes Absolute: 0.9 10*3/uL (ref 0.8–3.5)
MCH: 35.4 PG — ABNORMAL HIGH (ref 26.0–34.0)
MCHC: 36.7 g/dL — ABNORMAL HIGH (ref 30.0–36.5)
MCV: 96.5 FL (ref 80.0–99.0)
MPV: 9.5 FL (ref 8.9–12.9)
Monocytes %: 9 % (ref 5–13)
Monocytes Absolute: 0.6 10*3/uL (ref 0.0–1.0)
NRBC Absolute: 0 10*3/uL (ref 0.00–0.01)
Neutrophils %: 73 % (ref 32–75)
Neutrophils Absolute: 4.4 10*3/uL (ref 1.8–8.0)
Nucleated RBCs: 0 PER 100 WBC
Platelets: 138 10*3/uL — ABNORMAL LOW (ref 150–400)
RBC: 3.39 M/uL — ABNORMAL LOW (ref 4.10–5.70)
RDW: 18.9 % — ABNORMAL HIGH (ref 11.5–14.5)
WBC: 6.1 10*3/uL (ref 4.1–11.1)

## 2022-01-11 LAB — PROTIME-INR
INR: 7.9 (ref 0.9–1.1)
INR: 3.7 — ABNORMAL HIGH (ref 0.9–1.1)
Protime: 72 s — ABNORMAL HIGH (ref 9.0–11.1)
Protime: 35.7 s — ABNORMAL HIGH (ref 9.0–11.1)

## 2022-01-11 LAB — CBC
Hematocrit: 33.8 % — ABNORMAL LOW (ref 36.6–50.3)
Hemoglobin: 11 g/dL — ABNORMAL LOW (ref 12.1–17.0)
MCH: 29.6 PG (ref 26.0–34.0)
MCHC: 32.5 g/dL (ref 30.0–36.5)
MCV: 91.1 FL (ref 80.0–99.0)
MPV: 9.2 FL (ref 8.9–12.9)
NRBC Absolute: 0 10*3/uL (ref 0.00–0.01)
Nucleated RBCs: 0 PER 100 WBC
Platelets: 143 10*3/uL — ABNORMAL LOW (ref 150–400)
RBC: 3.71 M/uL — ABNORMAL LOW (ref 4.10–5.70)
RDW: 16.8 % — ABNORMAL HIGH (ref 11.5–14.5)
WBC: 6 10*3/uL (ref 4.1–11.1)

## 2022-01-11 LAB — COMPREHENSIVE METABOLIC PANEL
ALT: 16 U/L (ref 12–78)
AST: 34 U/L (ref 15–37)
Albumin/Globulin Ratio: 0.7 — ABNORMAL LOW (ref 1.1–2.2)
Albumin: 3 g/dL — ABNORMAL LOW (ref 3.5–5.0)
Alkaline Phosphatase: 94 U/L (ref 45–117)
Anion Gap: 6 mmol/L (ref 5–15)
BUN: 20 MG/DL (ref 6–20)
Bun/Cre Ratio: 11 — ABNORMAL LOW (ref 12–20)
CO2: 25 mmol/L (ref 21–32)
Calcium: 8.2 MG/DL — ABNORMAL LOW (ref 8.5–10.1)
Chloride: 104 mmol/L (ref 97–108)
Creatinine: 1.81 MG/DL — ABNORMAL HIGH (ref 0.70–1.30)
ESTIMATED GLOMERULAR FILTRATION RATE: 35 mL/min/{1.73_m2} — ABNORMAL LOW (ref >60)
Globulin: 4.4 g/dL — ABNORMAL HIGH (ref 2.0–4.0)
Glucose: 97 mg/dL (ref 65–100)
Potassium: 4.1 mmol/L (ref 3.5–5.1)
Sodium: 135 mmol/L — ABNORMAL LOW (ref 136–145)
Total Bilirubin: 0.7 MG/DL (ref 0.2–1.0)
Total Protein: 7.4 g/dL (ref 6.4–8.2)

## 2022-01-11 LAB — TROPONIN, HIGH SENSITIVITY: Troponin, High Sensitivity: 67 ng/L (ref 0–76)

## 2022-01-11 MED ORDER — SODIUM CHLORIDE 0.9 % IJ SYRG
Freq: Three times a day (TID) | INTRAMUSCULAR | Status: AC
Start: 2022-01-11 — End: 2022-01-12
  Administered 2022-01-11 – 2022-01-12 (×6): via INTRAVENOUS

## 2022-01-11 MED ORDER — POLYETHYLENE GLYCOL 3350 17 GRAM (100 %) ORAL POWDER PACKET
17 gram | Freq: Every day | ORAL | Status: AC | PRN
Start: 2022-01-11 — End: 2022-01-12

## 2022-01-11 MED ORDER — DULOXETINE 30 MG CAP, DELAYED RELEASE
30 mg | Freq: Every evening | ORAL | Status: DC
Start: 2022-01-11 — End: 2022-01-12
  Administered 2022-01-11: 23:00:00 via ORAL

## 2022-01-11 MED ORDER — FENTANYL CITRATE (PF) 50 MCG/ML IJ SOLN
50 mcg/mL | Freq: Once | INTRAMUSCULAR | Status: AC
Start: 2022-01-11 — End: 2022-01-10
  Administered 2022-01-11: 01:00:00 via INTRAVENOUS

## 2022-01-11 MED ORDER — ACETAMINOPHEN 325 MG TABLET
325 mg | Freq: Four times a day (QID) | ORAL | Status: AC | PRN
Start: 2022-01-11 — End: 2022-01-12

## 2022-01-11 MED ORDER — MORPHINE 2 MG/ML INJECTION
2 mg/mL | INTRAMUSCULAR | Status: AC | PRN
Start: 2022-01-11 — End: 2022-01-12

## 2022-01-11 MED ORDER — TETANUS AND DIPHTHERIA TOX (PF) 5 LF UNIT-2 LF UNIT/0.5 ML IM SYRINGE
5-2 Lf unit/0.5 mL | Freq: Once | INTRAMUSCULAR | Status: AC
Start: 2022-01-11 — End: 2022-01-10
  Administered 2022-01-11: 01:00:00 via INTRAMUSCULAR

## 2022-01-11 MED ORDER — SODIUM CHLORIDE 0.9 % IV
10 mg/mL | Freq: Once | INTRAVENOUS | Status: AC
Start: 2022-01-11 — End: 2022-01-11
  Administered 2022-01-11: 04:00:00 via INTRAVENOUS

## 2022-01-11 MED ORDER — LEVOTHYROXINE 50 MCG TAB
50 mcg | Freq: Every day | ORAL | Status: DC
Start: 2022-01-11 — End: 2022-01-12
  Administered 2022-01-11 – 2022-01-12 (×2): via ORAL

## 2022-01-11 MED ORDER — ACETAMINOPHEN 650 MG RECTAL SUPPOSITORY
650 mg | Freq: Four times a day (QID) | RECTAL | Status: AC | PRN
Start: 2022-01-11 — End: 2022-01-12

## 2022-01-11 MED ORDER — DULOXETINE 30 MG CAP, DELAYED RELEASE
30 mg | Freq: Every day | ORAL | Status: DC
Start: 2022-01-11 — End: 2022-01-11
  Administered 2022-01-11: 13:00:00 via ORAL

## 2022-01-11 MED ORDER — GABAPENTIN 300 MG CAP
300 mg | Freq: Two times a day (BID) | ORAL | Status: AC
Start: 2022-01-11 — End: 2022-01-12
  Administered 2022-01-11 – 2022-01-12 (×3): via ORAL

## 2022-01-11 MED ORDER — ATORVASTATIN 20 MG TAB
20 mg | Freq: Every day | ORAL | Status: DC
Start: 2022-01-11 — End: 2022-01-12
  Administered 2022-01-11 – 2022-01-12 (×2): via ORAL

## 2022-01-11 MED ORDER — LIDOCAINE 4 % TOPICAL PATCH (12 HOUR DURATION)
4 % | CUTANEOUS | Status: AC
Start: 2022-01-11 — End: 2022-01-12

## 2022-01-11 MED ORDER — PHYTONADIONE 10 MG/ML INJECTION
10 mg/mL | INTRAMUSCULAR | Status: DC
Start: 2022-01-11 — End: 2022-01-10
  Administered 2022-01-11: 03:00:00 via SUBCUTANEOUS

## 2022-01-11 MED ORDER — ONDANSETRON (PF) 4 MG/2 ML INJECTION
42 mg/2 mL | Freq: Four times a day (QID) | INTRAMUSCULAR | Status: DC | PRN
Start: 2022-01-11 — End: 2022-01-12

## 2022-01-11 MED ORDER — ONDANSETRON 4 MG TAB, RAPID DISSOLVE
4 mg | Freq: Three times a day (TID) | ORAL | Status: AC | PRN
Start: 2022-01-11 — End: 2022-01-12

## 2022-01-11 MED ORDER — FUROSEMIDE 40 MG TAB
40 mg | Freq: Every day | ORAL | Status: AC
Start: 2022-01-11 — End: 2022-01-12
  Administered 2022-01-11: 14:00:00 via ORAL

## 2022-01-11 MED ORDER — HYDROCODONE-ACETAMINOPHEN 5 MG-325 MG TAB
5-325 mg | Freq: Four times a day (QID) | ORAL | Status: AC | PRN
Start: 2022-01-11 — End: 2022-01-12
  Administered 2022-01-11: 23:00:00 via ORAL

## 2022-01-11 MED ORDER — SODIUM CHLORIDE 0.9 % IJ SYRG
INTRAMUSCULAR | Status: DC | PRN
Start: 2022-01-11 — End: 2022-01-12

## 2022-01-11 MED ORDER — AMIODARONE 200 MG TAB
200 mg | Freq: Every day | ORAL | Status: DC
Start: 2022-01-11 — End: 2022-01-12
  Administered 2022-01-11 – 2022-01-12 (×2): via ORAL

## 2022-01-11 MED FILL — FENTANYL CITRATE (PF) 50 MCG/ML IJ SOLN: 50 mcg/mL | INTRAMUSCULAR | Qty: 2

## 2022-01-11 MED FILL — TENIVAC (PF) 5 LF UNIT-2 LF UNIT/0.5 ML INTRAMUSCULAR SYRINGE: 5-2 Lf unit/0.5 mL | INTRAMUSCULAR | Qty: 0.5

## 2022-01-11 MED FILL — BD POSIFLUSH NORMAL SALINE 0.9 % INJECTION SYRINGE: INTRAMUSCULAR | Qty: 40

## 2022-01-11 MED FILL — LEVOTHYROXINE 50 MCG TAB: 50 mcg | ORAL | Qty: 1

## 2022-01-11 MED FILL — GABAPENTIN 300 MG CAP: 300 mg | ORAL | Qty: 1

## 2022-01-11 MED FILL — ATORVASTATIN 20 MG TAB: 20 mg | ORAL | Qty: 4

## 2022-01-11 MED FILL — LIDOCAINE 4 % TOPICAL PATCH (12 HOUR DURATION): 4 % | CUTANEOUS | Qty: 1

## 2022-01-11 MED FILL — AMIODARONE 200 MG TAB: 200 mg | ORAL | Qty: 1

## 2022-01-11 MED FILL — HYDROCODONE-ACETAMINOPHEN 5 MG-325 MG TAB: 5-325 mg | ORAL | Qty: 2

## 2022-01-11 MED FILL — FUROSEMIDE 40 MG TAB: 40 mg | ORAL | Qty: 1

## 2022-01-11 MED FILL — DULOXETINE 30 MG CAP, DELAYED RELEASE: 30 mg | ORAL | Qty: 1

## 2022-01-11 MED FILL — PHYTONADIONE 10 MG/ML INJECTION: 10 mg/mL | INTRAMUSCULAR | Qty: 1

## 2022-01-11 NOTE — Progress Notes (Signed)
[facility]  [address], [place] [address]  [phone]      Medical Progress Note      NAME: Mason Fields   DOB:  [birth year]  MRM:  [id number]    Date/Time of service: 01/11/2022         Subjective:     Chief Complaint:  Patient was personally seen and examined by me during this time period.  Chart reviewed.   Fu rib fractures, compression fractures.  History of lower back pain with ambulation.  No chest pain or shortness of breath. Denied syncope.        Objective:       Vitals:       Last 24hrs VS reviewed since prior progress note. Most recent are:    Visit Vitals  BP 127/77 (BP 1 Location: Left upper arm, BP Patient Position: At rest)   Pulse 85   Temp 97.2 F (36.2 C)   Resp 18   Ht 5\' 6"  (1.676 m)   Wt 74.8 kg (165 lb)   SpO2 97%   BMI 26.63 kg/m     SpO2 Readings from Last 6 Encounters:   01/11/22 97%          Intake/Output Summary (Last 24 hours) at 01/11/2022 1202  Last data filed at 01/11/2022 1037  Gross per 24 hour   Intake 290 ml   Output 250 ml   Net 40 ml        Exam:     Physical Exam:    Gen:  elderly, in no acute distress  HEENT:  Pink conjunctivae, PERRL, hearing intact to voice  Resp:  No accessory muscle use, clear breath sounds without wheezes rales or rhonchi  Card:  RRR, No murmurs, normal S1, S2, no peripheral edema  Abd:  Soft, non-tender, non-distended, normoactive bowel sounds are present  Musc:  No cyanosis or clubbing  Skin:  abrasions of face   Neuro:  Cranial nerves 3-12 are grossly intact, follows commands appropriately  Psych:  Oriented to person, place, and time, Alert with good insight      Medications Reviewed: (see below)    Lab Data Reviewed: (see below)    ______________________________________________________________________    Medications:     Current Facility-Administered Medications   Medication Dose Route Frequency    sodium chloride (NS) flush 5-40 mL  5-40 mL IntraVENous Q8H    sodium chloride (NS) flush 5-40 mL  5-40 mL  IntraVENous PRN    acetaminophen (TYLENOL) tablet 650 mg  650 mg Oral Q6H PRN    Or    acetaminophen (TYLENOL) suppository 650 mg  650 mg Rectal Q6H PRN    polyethylene glycol (MIRALAX) packet 17 g  17 g Oral DAILY PRN    ondansetron (ZOFRAN ODT) tablet 4 mg  4 mg Oral Q8H PRN    Or    ondansetron (ZOFRAN) injection 4 mg  4 mg IntraVENous Q6H PRN    HYDROcodone-acetaminophen (NORCO) 5-325 mg per tablet 2 Tablet  2 Tablet Oral Q6H PRN    morphine injection 2 mg  2 mg IntraVENous Q3H PRN    amiodarone (CORDARONE) tablet 100 mg  100 mg Oral DAILY    atorvastatin (LIPITOR) tablet 80 mg  80 mg Oral DAILY    furosemide (LASIX) tablet 40 mg  40 mg Oral DAILY    gabapentin (NEURONTIN) capsule 300 mg  300 mg Oral Q12H    levothyroxine (SYNTHROID) tablet 50 mcg  50 mcg Oral ACB    DULoxetine (CYMBALTA) capsule 30 mg  30 mg Oral QPM          Lab Review:     Recent Labs     01/11/22  0424 01/10/22  2102   WBC 6.0 6.1   HGB 11.0* 12.0*   HCT 33.8* 32.7*   PLT 143* 138*     Recent Labs     01/11/22  0424 01/10/22  2102   NA  --  135*   K  --  4.1   CL  --  104   CO2  --  25   GLU  --  97   BUN  --  20   CREA  --  1.81*   CA  --  8.2*   ALB  --  3.0*   TBILI  --  0.7   ALT  --  16   INR 3.7* 7.9*     No results found for: GLUCPOC       Assessment / Plan:     Acute T11 and T12 posterior rib fractures POA: No hypoxia.  Pain control with lidocaine patch.  Tylenol as needed.  Encourage incentive spirometry.    Age-indeterminate vertebral compression fractures at T12, L1 and L3 POA: Does endorse some associated back pain.  Obtain MRI thoracic and MRI lumbar.  Lidocaine patch.  Tylenol as needed.  Norco as needed and IV morphine as needed.  Ortho consulted.  Possible kyphoplasty pending MRI results.    Fall POA: Does not appear to be cardiac or neurological prodrome.  PT OT.    Aki or possible CKD: unclear baseline. Check bladder check. Hold lasix for now. UA unremarkable. Monitor     Supratherapeutic INR POA: INR 7.9 on admission.  Status post vitamin K fibroelastosis related ER.  Hold warfarin for now.  Consult cardiology to weigh in whether alternative NOAC would be better for patient.    Atrial fibrillation: Currently rate controlled.  Continue home amiodarone.  Holding Coumadin due to above.    Hypothyroidism: Check TSH.  Continue home Synthroid.    Total time spent with patient: 65 Minutes **I personally saw and examined the patient during this time period**                 Care Plan discussed with: Patient, Care Manager, Nursing Staff, and Consultant/Specialist    Discussed:  Care Plan    Prophylaxis:  Coumadin    Disposition:  SNF/LTC           ___________________________________________________    Attending Physician: Horald Chestnut, DO

## 2022-01-11 NOTE — ED Notes (Signed)
Rounded on patient, noted to be stable on the monitor, still in AFIB rate controlled, denies any significant pain at this time. Warm blankets applied.

## 2022-01-11 NOTE — ED Notes (Signed)
TRANSFER - OUT REPORT:    Verbal report given to Saicora RN(name) on Tyke Outman  being transferred to 435(unit) for routine progression of care       Report consisted of patient's Situation, Background, Assessment and   Recommendations(SBAR).     Information from the following report(s) SBAR, Kardex, ED Summary, Procedure Summary, Intake/Output, MAR, Accordion, Recent Results, and Med Rec Status was reviewed with the receiving nurse.    Lines:   Peripheral IV 01/10/22 Left Antecubital (Active)   Site Assessment Clean, dry, & intact 01/10/22 2054   Phlebitis Assessment 0 01/10/22 2054   Infiltration Assessment 0 01/10/22 2054   Dressing Status Clean, dry, & intact 01/10/22 2054   Hub Color/Line Status Pink 01/10/22 2054   Action Taken Blood drawn 01/10/22 2054        Opportunity for questions and clarification was provided.      Patient transported with:   Registered Nurse

## 2022-01-11 NOTE — Progress Notes (Signed)
01/11/2022 11:47 AM   Care Management Assessment      Reason for Admission: Emergency -     Rib fractures [S22.49XA]       Assessment:   [x] In person with pt and pt's wife  Charted address and phone numbers confirmed.       RUR: 11%  Risk Level: [x] Low [] Moderate [] High  Value-based purchasing: []  Yes [x]  No    Advance Directive: Full Code.    [x]  No AD on file.    []  AD on file.    []  Current AD not on file. Copy requested.    []  Requests AD, and referral submitted to [facility].     Healthcare Decision Maker:         Assessment:    Age: 88 y.o.    Sex: [x]  Male [] Male     Residency: [x] Private residence [] Apartment [] Assisted Living [] LTC [] Other:   Exterior Steps: 4  Interior Steps: 0    Lives With: [x] With spouse [] Other family members [] Underage children [] Alone [] Care provider [] Other:    Prior functioning:  [x] Independent with ADLs and iADLS [] Dependent with ADLs and iADLs [] Partial dependence, Specify:     Prior transportation method: [] Self [x] Spouse [] Other family members [] Medicaid transport [] Other:       DME available: [] None [x] RW [] Cane [] Crutches [] Bedside commode [] CPAP [] Home O2 (Liter/Provider: ) [] Nebulizer   [x] Shower Chair [] Wheelchair [] Hospital Bed [] Hoyer [] Stair lift [x] Rollator [] Other:    Rehab history: [x] None [] Outpatient PT [] Home Health (Provider/Date: ) [] SNF (Provider/Date: ) [] IPR (Provider/Date: ) [] LTC (Provider/Date: ) [] Hospice (Provider/Date: )  [] Other:     Covid vaccination status:   [x]  Yes, Type:  []  Booster 1 []  Booster 2  []  No  []  N/A / Not asked    Insurer: CM sent pt's insurance card to Patient Access to update in the system  Insurance Information                  [place] MED[place]ARE/VA MEDICARE PART A & B Phone: [phone]    Subscriber: Arman, Loy Subscriber#:    Group#: -- Precert#: --            PCP:   Address: [address] / [place]    Phone number: [phone]   Current patient: [x] Yes [] No   Approximate date of  last visit: 2 months ago   Access to virtual PCP visits: [] Yes [] No       Pharmacy: [facility] [address]     DC Transport: [name]            Transition of care plan:    [x] Unable to determine at this time. Awaiting clinical progress, and disposition recommendations.  Will follow for PT and OT evals.  95, BSW    Care Management Interventions  PCP Verified by CM: Yes  MyChart Signup: No  Discharge Durable Medical Equipment: No  Physical Therapy Consult: Yes  Occupational Therapy Consult: Yes  Speech Therapy Consult: No  Support Systems: Spouse/Significant Other  Discharge Location  Patient Expects to be Discharged to:: Unable to determine at this time

## 2022-01-11 NOTE — Progress Notes (Signed)
Progress Notes by Marca Ancona, OT at 01/11/22 1456                Author: Marca Ancona, OT  Service: Occupational Therapy  Author Type: Occupational Therapist       Filed: 01/11/22 1505  Date of Service: 01/11/22 1456  Status: Signed          Editor: Marca Ancona, OT (Occupational Therapist)                  Problem: Self Care Deficits Care Plan (Adult)   Goal: *Acute Goals and Plan of Care (Insert Text)   Description: FUNCTIONAL STATUS PRIOR TO ADMISSION: Patient was modified independent using a rollator for functional mobility.      HOME SUPPORT: The patient lived with spouse but did not require assist.    Occupational Therapy Goals  Initiated 01/11/2022  1.  Patient will perform lower body dressing with supervision/set-up within 7 day(s).  2.  Patient will perform  grooming with supervision/set-up within 7 day(s).  3.  Patient will perform toilet transfers with supervision/set-up within 7 day(s).  4.  Patient will perform all aspects of toileting with supervision/set-up within 7 day(s).  5.  Patient  will participate in upper extremity therapeutic exercise/activities with supervision/set-up for 10 minutes within 7 day(s).         Outcome: Progressing Towards Goal    OCCUPATIONAL THERAPY EVALUATION   Patient: Mason Fields (88 y.o. male)   Date: 01/11/2022   Primary Diagnosis: Rib fractures [S22.49XA]         Precautions: fall           ASSESSMENT   Based on the objective data described below, the patient presents with hospital admission following fall at home resulting in rib fractures.  Patient received in bed, agreeable to activity.  Patient  requires increased time to EOB but manages with SBA.   Sit to stand with CGA.  He is able to transfer to chair with RW and CGA. Patient with no LOB but requires cues for walker management.  He reports using cane or rollator in home.  Patient left in chair  in NAD, alarm set..      Current Level of Function Impacting Discharge (ADLs/self-care):  CGA      Functional Outcome Measure:  The patient scored 18/24 on the AM PAC outcome measure.        Other factors to consider for discharge: lives with spouse       Patient will benefit from skilled therapy intervention to address the above noted impairments.            PLAN :   Recommendations and Planned Interventions: self care training, functional mobility training,  therapeutic exercise, balance training, therapeutic activities, neuromuscular re-education, patient education, home safety training, and family training/education      Frequency/Duration: Patient will be followed by occupational therapy 5 times a week to address goals.      Recommendation for discharge: (in order for the patient to meet his/her long term  goals)   Occupational therapy at least 2 days/week in the home       This discharge recommendation:   Has been made in collaboration with the attending provider and/or case management      IF patient discharges home will need the following DME: TBD             SUBJECTIVE:     Patient stated Its a little tender on this  side.   RE: right side due to rib fractures      OBJECTIVE DATA SUMMARY:     HISTORY:      Past Medical History:        Diagnosis  Date         ?  A-fib (HCC)       ?  CHF (congestive heart failure) (HCC)       ?  Hypercholesteremia           ?  Hypothyroid            Past Surgical History:         Procedure  Laterality  Date          ?  HX CORONARY STENT PLACEMENT              10 years ago           Expanded or extensive additional review of patient history:       Home Situation   Home Environment: Private residence   # Steps to Enter: 3   One/Two Story Residence: One story   Living Alone: No   Support Systems: Spouse/Significant Other   Patient Expects to be Discharged to:: Unable to determine at this time   Current DME Used/Available at Home: Dan Humphreys, rolling, Environmental consultant, rollator, Medical laboratory scientific officer, straight   Tub or Shower Type: Shower      Hand dominance: Right      EXAMINATION OF PERFORMANCE  DEFICITS:   Cognitive/Behavioral Status:   Neurologic State: Alert   Orientation Level: Oriented X4   Cognition: Appropriate decision making;Follows commands   Perception: Appears intact   Perseveration: No perseveration noted   Safety/Judgement: Awareness of environment      Skin: intact as seen      Edema: none noted       Hearing:   Auditory   Auditory Impairment: Hard of hearing, bilateral      Vision/Perceptual:                                              Range of Motion:   AROM: Within functional limits                                 Strength:   Strength: Generally decreased, functional                      Coordination:   Coordination: Within functional limits   Fine Motor Skills-Upper: Left Intact;Right Intact     Gross Motor Skills-Upper: Left Intact;Comment      Tone & Sensation:      Tone: Normal   Sensation: Intact                              Balance:   Sitting: Intact;High guard   Standing: With support;Impaired   Standing - Static: Good   Standing - Dynamic : Fair      Functional Mobility and Transfers for ADLs:   Bed Mobility:   Rolling: Stand-by assistance   Supine to Sit: Bed Modified;Additional time;Stand-by assistance   Sit to Supine: Stand-by assistance   Scooting: Stand-by assistance      Transfers:   Sit to Stand: Contact guard assistance  Stand to Sit: Contact guard assistance   Bed to Chair: Contact guard assistance   Toilet Transfer : Contact guard assistance   Assistive Device : Walker, rolling      ADL Assessment:                        Type of Bath: Bath Pack                                              ADL Intervention and task modifications:                        Type of Bath: Horticulturist, commercial: Awareness of environment         Therapeutic Exercise:       Functional Measure:   [employer]       Daily Activity Inpatient Short Form (6-Clicks) Version 2         How much HELP from another person do you  currently need...  (If the patient hasn't done an activity recently, how much help from another person do you think they would need if they tried?)  Total  A Lot  A Little  None     1.  Putting on and taking off regular lower body clothing?     1     2     3     4     2.  Bathing (including washing, rinsing, drying )?     1     2     3     4     3.  Toileting, which includes using toilet, bedpan, or urinal?     1     2     3     4     4.  Putting on and taking off regular upper body clothing?     1     2     3     4     5.  Taking care of personal grooming such as brushing teeth?     1     2     3     4           6.  Eating meals?     1     2     3     4        Raw Score: 18/24                              Cutoff score ?191,2,3  had higher odds of discharging home with home health or need of SNF/IPR      1. Emelia Loron, Janeece Riggers, Vinoth Fransico Meadow, Lupe Carney Passek, Thornton Dales. Cassandria Anger.  Validity of the AM-PAC  6-Clicks Inpatient Daily Activity and Basic Mobility Short Forms. Physical Therapy Mar 2014, 94 (3) 379-391; DOI: 10.2522/ptj.20130199   2. Venetia Night. Association of AM-PAC "6-Clicks" Basic Mobility and Daily  Activity Scores With Discharge Destination.  Phys Ther. 2021 Apr 4;101(4):pzab043. doi: 10.1093/ptj/pzab043. PMID: 1610960433517463.   3. Herbold J, Rajaraman D, Lubertha Basqueaylor S, Agayby K, [place] S. Activity Measure for Post-Acute Care "6-Clicks" Basic Mobility Scores Predict Discharge Destination After Acute Care Hospitalization in [facility]  Patient Groups: A Retrospective, Observational Study. Arch Rehabil Res Clin Transl. 2022 Jul 16;4(3):100204. doi: 10.1016/j.arrct.[id number]. PMID: 7829562136123982; PMC: [id number]: [id number].   4. Josefina DoJette A, Haley S, Coster W, Ni P. AM-PAC Short Forms Manual 4.0. Revised 10/2018.           Occupational Therapy Evaluation Charge Determination         History  Examination  Decision-Making          LOW Complexity : Brief history review   LOW Complexity : 1-3 performance deficits relating to physical, cognitive , or psychosocial skils that result in activity limitations and / or participation  restrictions   LOW Complexity : No comorbidities that affect functional and no verbal or physical assistance needed to complete eval tasks          Based on the above components, the patient evaluation is determined to be of the following complexity level:  LOW    Pain Rating:         Activity Tolerance:    Good      After treatment patient left in no apparent distress:     Sitting in chair, Call bell within reach, and Bed / chair alarm activated        COMMUNICATION/EDUCATION:     The patients plan of care was discussed with: Physical therapist and Registered nurse.       Home safety education was provided and the patient/caregiver indicated understanding., Patient/family have participated as able in goal setting and plan of care., and Patient/family agree to work toward stated goals and plan of care.      This patients plan of care is appropriate for delegation to OTA.      Thank you for this referral.   Jocelyn C Skipper, OTR/L   Time Calculation: 21 mins

## 2022-01-11 NOTE — Progress Notes (Signed)
Problem: Mobility Impaired (Adult and Pediatric)  Goal: *Acute Goals and Plan of Care (Insert Text)  Description: FUNCTIONAL STATUS PRIOR TO ADMISSION: Patient was modified independent using a rollator and rolling walker for functional mobility.    HOME SUPPORT PRIOR TO ADMISSION: The patient lived with spouse but did not require assist.    Physical Therapy Goals  Initiated 01/11/2022  1.  Patient will move from supine to sit and sit to supine , scoot up and down, and roll side to side in bed with independence within 7 day(s).    2.  Patient will transfer from bed to chair and chair to bed with modified independence using the least restrictive device within 7 day(s).  3.  Patient will perform sit to stand with modified independence within 7 day(s).  4.  Patient will ambulate with modified independence for 200 feet with the least restrictive device within 7 day(s).   5.  Patient will ascend/descend 4 stairs with  handrail(s) with modified independence within 7 day(s).   01/11/2022 1307 by Vladimir Fasterela Cruz, Valentino NoseAlberto E, PT  Outcome: Progressing Towards Goal   PHYSICAL THERAPY EVALUATION  Patient: Mason Fields ([age] y.o. male)  Date: 01/11/2022  Primary Diagnosis: Rib fractures [S22.49XA]       Precautions: fall       ASSESSMENT  Based on the objective data described below, the patient presents with difficulty with ambulation been falling at home has right rib fractures. Communicated with nurse cleared for therapy. Patient supine on bed when received, rolled on the edge of bed SBA, supine to sit CGA, sit to stand CGA, ambulate with rolling walker in the room and around the bed CGA no loss of balance slow pace gait no loss of balance. Need verbal cues to stay within the rolling walker. OOB to chair as tolerated performed some active range of motion exercise on both LE all planes. Reporting pain on the right side on the trunk instructed to do log roll supine to sit. Educate and instructed patient to continue active range of  motion exercise on both legs while up on chair or on bed multiple times. Recommend patient to be up on the chair at least 3 times a day every meal times as tolerated. Activated chair alarm and notified nurse who agreed to monitor patient.                                                                                                                                                           Current Level of Function Impacting Discharge (mobility/balance): CGA with transfers and ambulation using a rolling walker.    Functional Outcome Measure:  The patient scored 18/24 on the ampac outcome measure .      Other factors to consider for discharge: fall, pain     Patient  will benefit from skilled therapy intervention to address the above noted impairments.       PLAN :  Recommendations and Planned Interventions: bed mobility training, transfer training, gait training, therapeutic exercises, neuromuscular re-education, orthotic/prosthetic training, patient and family training/education, and therapeutic activities      Frequency/Duration: Patient will be followed by physical therapy:  daily to address goals.    Recommendation for discharge: (in order for the patient to meet his/her long term goals)  Physical therapy at least 2 days/week in the home vs snf TBD pending progress from therapy    This discharge recommendation:  Has been made in collaboration with the attending provider and/or case management    IF patient discharges home will need the following DME: patient owns DME required for discharge         SUBJECTIVE:   Patient stated "ok."    OBJECTIVE DATA SUMMARY:   HISTORY:    Past Medical History:   Diagnosis Date    A-fib (HCC)     CHF (congestive heart failure) (HCC)     Hypercholesteremia     Hypothyroid      Past Surgical History:   Procedure Laterality Date    HX CORONARY STENT PLACEMENT      10 years ago       Personal factors and/or comorbidities impacting plan of care:     Home Situation  Home Environment:  Private residence  # Steps to Enter: 3  One/Two Story Residence: One story  Living Alone: No  Support Systems: Spouse/Significant Other  Patient Expects to be Discharged to:: Unable to determine at this time  Current DME Used/Available at Home: Dan Humphreys, rolling, Environmental consultant, rollator, Medical laboratory scientific officer, straight  Tub or Shower Type: Shower    EXAMINATION/PRESENTATION/DECISION MAKING:   Critical Behavior:  Neurologic State: Alert  Orientation Level: Oriented X4  Cognition: Appropriate decision making, Follows commands  Safety/Judgement: Awareness of environment  Hearing:  Auditory  Auditory Impairment: Hard of hearing, bilateral    Range Of Motion:  AROM: Within functional limits                       Strength:    Strength: Generally decreased, functional                    Tone & Sensation:   Tone: Normal              Sensation: Intact               Coordination:  Coordination: Within functional limits  Vision:      Functional Mobility:  Bed Mobility:  Rolling: Stand-by assistance  Supine to Sit: Bed Modified;Additional time;Stand-by assistance  Sit to Supine: Stand-by assistance  Scooting: Stand-by assistance  Transfers:  Sit to Stand: Contact guard assistance  Stand to Sit: Contact guard assistance  Stand Pivot Transfers: Contact guard assistance     Bed to Chair: Contact guard assistance              Balance:   Sitting: Intact;High guard  Standing: With support;Impaired  Standing - Static: Good  Standing - Dynamic : Fair  Ambulation/Gait Training:  Distance (ft): 20 Feet (ft)  Assistive Device: Walker, rolling;Gait belt  Ambulation - Level of Assistance: Contact guard assistance     Gait Description (WDL): Exceptions to WDL  Gait Abnormalities: Antalgic;Path deviations;Step to gait        Base of Support: Narrowed     Speed/Cadence: Fluctuations;Slow  Step Length: Right shortened;Left shortened            :              Therapeutic Exercises:   Educate and instructed patient to continue active range of motion exercise on both legs  while up on chair or on bed multiple times. Recommend patient to be up on the chair at least 3 times a day every meal times as tolerated.         Functional Measure:  [facility] AM-PAC      Basic Mobility Inpatient Short Form (6-Clicks) Version 2  How much HELP from another person do you currently need... (If the patient hasn't done an activity recently, how much help from another person do you think they would need if they tried?) Total A Lot A Little None   1.  Turning from your back to your side while in a flat bed without using bedrails?   1   2   3    4   2.  Moving from lying on your back to sitting on the side of a flat bed without using bedrails?   1   2   3    4   3.  Moving to and from a bed to a chair (including a wheelchair)?   1   2   3    4   4. Standing up from a chair using your arms (e.g. wheelchair or bedside chair)?   1   2   3    4   5.  Walking in hospital room?   1   2   3    4   6.  Climbing 3-5 steps with a railing?   1   2   3    4     Raw Score: 18/24                            Cutoff score ?171,2,3 had higher odds of discharging home with home health or need of SNF/IPR.    1. Emelia Loron, Janeece Riggers, Vinoth Fransico Meadow, Lupe Carney Passek, Thornton Dales. Cassandria Anger.  Validity of the AM-PAC "6-Clicks" Inpatient Daily Activity and Basic Mobility Short Forms. Physical Therapy Mar 2014, 94 (3) 379-391; DOI: 10.2522/ptj.20130199  2. Venetia Night. Association of AM-PAC "6-Clicks" Basic Mobility and Daily Activity Scores With Discharge Destination. Phys Ther. 2021 Apr 4;101(4):pzab043. doi: 10.1093/ptj/pzab043. PMID: 04540981.  3. Herbold J, Rajaraman D, Lubertha Basque, Agayby K, Golinda S. Activity Measure for Post-Acute Care "6-Clicks" Basic Mobility Scores Predict Discharge Destination After Acute Care Hospitalization in Select Patient Groups: A Retrospective, Observational Study. Arch Rehabil  Res Clin Transl. 2022 Jul 16;4(3):100204. doi: 10.1016/j.arrct.1914.782956. PMID: 21308657; PMCID: QIO9629528.  4. Josefina Do, Coster W, Ni P. AM-PAC Short Forms Manual 4.0. Revised 10/2018.        Physical Therapy Evaluation Charge Determination   History Examination Presentation Decision-Making   LOW Complexity : Zero comorbidities / personal factors that will impact the outcome / POC LOW Complexity : 1-2 Standardized tests and measures addressing body structure, function, activity limitation and / or participation in recreation  LOW Complexity : Stable, uncomplicated  Other outcome measures ampac  LOW       Based on the above components, the patient evaluation is determined to be of the following complexity level: LOW  Pain Rating:  5/10    Activity Tolerance:   Good    After treatment patient left in no apparent distress:   Sitting in chair, Heels elevated for pressure relief, Call bell within reach, and Bed / chair alarm activated    COMMUNICATION/EDUCATION:   The patient's plan of care was discussed with: Occupational therapist, Registered nurse, and Case management.     Fall prevention education was provided and the patient/caregiver indicated understanding.    Thank you for this referral.  Worthy Flank, PT,WCC.   Time Calculation: 42 mins

## 2022-01-11 NOTE — Progress Notes (Signed)
.  I have reviewed discharge instructions with the patient and daughter.  The patient and daughter verbalized understanding.

## 2022-01-11 NOTE — ED Notes (Signed)
Bedside and Verbal shift change report given to Joaquin Bend, Therapist, sports (oncoming nurse) by Steffanie Dunn Mattis-Francis, RN  (offgoing nurse). Report included the following information SBAR, ED Summary and Intake/Output.

## 2022-01-11 NOTE — Progress Notes (Signed)
[facility] Dosing Services: 01/11/2022    Pharmacist Renal Dosing Progress Note for Gabapentin & Cymbalta  Physician Dr. Burnett Corrente    AKI on admission- CrCl ~24 ml/min.     Gabapentin dose adjusted to 300 mg Q12 hrs initially (max dose 700 mg per 24 hrs for CrCl 15-29 ml/min per protocol.     Duloxetine dose adjusted to 30 mg every 24 hours (max dose 30 mg / 24 hrs for CrCl <30 ml/min)    Previous Regimen Home dose 300 mg TID (Gabapentin)  Home dose 60 mg daily (cymbalta)   Serum Creatinine Lab Results   Component Value Date/Time    Creatinine 1.81 (H) 01/10/2022 09:02 PM       Creatinine Clearance Estimated Creatinine Clearance: 24.5 mL/min (A) (based on SCr of 1.81 mg/dL (H)).   BUN Lab Results   Component Value Date/Time    BUN 20 01/10/2022 09:02 PM         Pharmacy to continue to monitor patient daily.   Will make dosage adjustments based upon changing renal function.  Signed Suszanne Finch, PHARMD. Contact information:  [phone]

## 2022-01-11 NOTE — Consults (Signed)
Consults  by Fredderick Severance, PA at 01/11/22 1504                Author: Fredderick Severance, PA  Service: Orthopedic Surgery  Author Type: Physician Assistant       Filed: 01/11/22 1505  Date of Service: 01/11/22 1504  Status: Signed           Editor: Fredderick Severance, PA (Physician Assistant)  Cosigner: Carlean Jews, MD at 01/12/22 0848            Consult Orders        1. IP CONSULT TO ORTHOPEDIC SURGERY [[id number]] ordered by Vira Browns, MD at 01/11/22 0037                              Orthopedics does not manage rib fractures at [facility].  This is a two level posterior fracture- patient was up in room and has walked without hypoxia.  If worsening hypoxia would ask  pulmonary or gen surgery to evaluate, but unlikely this needs ORIF.           Vanessa Durham, PA-C   Orthopaedic Surgery PA   [facility]   [facility]

## 2022-01-12 LAB — METABOLIC PANEL, BASIC
Anion gap: 6 mmol/L (ref 5–15)
BUN/Creatinine ratio: 17 (ref 12–20)
BUN: 31 MG/DL — ABNORMAL HIGH (ref 6–20)
CO2: 27 mmol/L (ref 21–32)
Calcium: 7.7 MG/DL — ABNORMAL LOW (ref 8.5–10.1)
Chloride: 104 mmol/L (ref 97–108)
Creatinine: 1.87 MG/DL — ABNORMAL HIGH (ref 0.70–1.30)
Glucose: 98 mg/dL (ref 65–100)
Potassium: 3.2 mmol/L — ABNORMAL LOW (ref 3.5–5.1)
Sodium: 137 mmol/L (ref 136–145)
eGFR: 34 mL/min/{1.73_m2} — ABNORMAL LOW (ref >60)

## 2022-01-12 LAB — PROTHROMBIN TIME + INR
INR: 1.5 — ABNORMAL HIGH (ref 0.9–1.1)
Prothrombin time: 15.3 s — ABNORMAL HIGH (ref 9.0–11.1)

## 2022-01-12 LAB — TSH 3RD GENERATION
TSH: 8.04 u[IU]/mL — ABNORMAL HIGH (ref 0.36–3.74)
TSH: 8.04 u[IU]/mL — ABNORMAL HIGH (ref 0.36–3.74)

## 2022-01-12 LAB — EKG, 12 LEAD, INITIAL: Calculated R Axis: -41 degrees

## 2022-01-12 LAB — SAMPLES BEING HELD

## 2022-01-12 LAB — BASIC METABOLIC PANEL
Anion Gap: 6 mmol/L (ref 5–15)
BUN: 31 MG/DL — ABNORMAL HIGH (ref 6–20)
Bun/Cre Ratio: 17 (ref 12–20)
CO2: 27 mmol/L (ref 21–32)
Calcium: 7.7 MG/DL — ABNORMAL LOW (ref 8.5–10.1)
Chloride: 104 mmol/L (ref 97–108)
Creatinine: 1.87 MG/DL — ABNORMAL HIGH (ref 0.70–1.30)
ESTIMATED GLOMERULAR FILTRATION RATE: 34 mL/min/{1.73_m2} — ABNORMAL LOW (ref >60)
Glucose: 98 mg/dL (ref 65–100)
Potassium: 3.2 mmol/L — ABNORMAL LOW (ref 3.5–5.1)
Sodium: 137 mmol/L (ref 136–145)

## 2022-01-12 LAB — EKG 12-LEAD
Atrial Rate: 344 {beats}/min
Q-T Interval: 438 ms
QRS Duration: 104 ms
QTc Calculation (Bazett): 511 ms
R Axis: -41 degrees
T Axis: 117 degrees
Ventricular Rate: 82 {beats}/min

## 2022-01-12 LAB — PROTIME-INR
INR: 1.5 — ABNORMAL HIGH (ref 0.9–1.1)
Protime: 15.3 s — ABNORMAL HIGH (ref 9.0–11.1)

## 2022-01-12 MED ORDER — LIDOCAINE 4 % TOPICAL PATCH (12 HOUR DURATION)
4 % | MEDICATED_PATCH | CUTANEOUS | 0 refills | Status: DC
Start: 2022-01-12 — End: 2022-01-12

## 2022-01-12 MED ORDER — POTASSIUM CHLORIDE SR 10 MEQ TAB
10 mEq | ORAL | Status: AC
Start: 2022-01-12 — End: 2022-01-12
  Administered 2022-01-12: 20:00:00 via ORAL

## 2022-01-12 MED ORDER — LIDOCAINE 4 % TOPICAL PATCH (12 HOUR DURATION)
4 % | MEDICATED_PATCH | CUTANEOUS | 0 refills | Status: AC
Start: 2022-01-12 — End: ?

## 2022-01-12 MED ORDER — ACETAMINOPHEN 325 MG TABLET
325 mg | ORAL_TABLET | Freq: Four times a day (QID) | ORAL | 0 refills | Status: AC | PRN
Start: 2022-01-12 — End: ?

## 2022-01-12 MED ORDER — GABAPENTIN 300 MG CAP
300 mg | ORAL_CAPSULE | Freq: Two times a day (BID) | ORAL | 0 refills | Status: AC
Start: 2022-01-12 — End: ?

## 2022-01-12 MED ORDER — DULOXETINE 30 MG CAP, DELAYED RELEASE
30 mg | ORAL_CAPSULE | Freq: Every evening | ORAL | 0 refills | Status: AC
Start: 2022-01-12 — End: ?

## 2022-01-12 MED FILL — LEVOTHYROXINE 50 MCG TAB: 50 mcg | ORAL | Qty: 1

## 2022-01-12 MED FILL — LIDOCAINE 4 % TOPICAL PATCH (12 HOUR DURATION): 4 % | CUTANEOUS | Qty: 1

## 2022-01-12 MED FILL — GABAPENTIN 300 MG CAP: 300 mg | ORAL | Qty: 1

## 2022-01-12 MED FILL — POTASSIUM CHLORIDE SR 10 MEQ TAB: 10 mEq | ORAL | Qty: 4

## 2022-01-12 MED FILL — ATORVASTATIN 20 MG TAB: 20 mg | ORAL | Qty: 4

## 2022-01-12 MED FILL — AMIODARONE 200 MG TAB: 200 mg | ORAL | Qty: 1

## 2022-01-12 NOTE — Progress Notes (Signed)
Problem: Pressure Injury - Risk of  Goal: *Prevention of pressure injury  Description: Document Braden Scale and appropriate interventions in the flowsheet.  Outcome: Progressing Towards Goal  Note: Pressure Injury Interventions:  Sensory Interventions: Assess changes in LOC    Moisture Interventions: Minimize layers, Moisture barrier    Activity Interventions: PT/OT evaluation, Increase time out of bed    Mobility Interventions: PT/OT evaluation    Nutrition Interventions: Document food/fluid/supplement intake    Friction and Shear Interventions: Apply protective barrier, creams and emollients, Minimize layers                Problem: Falls - Risk of  Goal: *Absence of Falls  Description: Document Schmid Fall Risk and appropriate interventions in the flowsheet.  Outcome: Progressing Towards Goal  Note: Fall Risk Interventions:

## 2022-01-12 NOTE — Progress Notes (Signed)
Progress  Notes by Estill Bamberg at 01/12/22 1606                Author: Estill Bamberg  Service: --  Author Type: Registered Nurse       Filed: 01/12/22 1606  Date of Service: 01/12/22 1606  Status: Signed          Editor: Estill Bamberg (Registered Nurse)                  Problem: Pressure Injury - Risk of   Goal: *Prevention of pressure injury   Description: Document Braden Scale and appropriate interventions in the flowsheet.   01/12/2022 1605 by Estill Bamberg   Outcome: Resolved/Met   Note: Pressure Injury Interventions:   Sensory Interventions: Assess changes in LOC      Moisture Interventions: Minimize layers, Moisture barrier      Activity Interventions: PT/OT evaluation, Increase time out of bed      Mobility Interventions: PT/OT evaluation      Nutrition Interventions: Document food/fluid/supplement intake      Friction and Shear Interventions: Apply protective barrier, creams and emollients, Minimize layers                   01/12/2022 1456 by Dyanne Carrel, Chanel   Outcome: Progressing Towards Goal   Note: Pressure Injury Interventions:   Sensory Interventions: Assess changes in LOC      Moisture Interventions: Minimize layers, Moisture barrier      Activity Interventions: PT/OT evaluation, Increase time out of bed      Mobility Interventions: PT/OT evaluation      Nutrition Interventions: Document food/fluid/supplement intake      Friction and Shear Interventions: Apply protective barrier, creams and emollients, Minimize layers                       Problem: Patient Education: Go to Patient Education Activity   Goal: Patient/Family Education   Outcome: Resolved/Met       Problem: Falls - Risk of   Goal: *Absence of Falls   Description: Document Schmid Fall Risk and appropriate interventions in the flowsheet.   Outcome: Resolved/Met   Note: Fall Risk Interventions:                                             Problem: Patient Education: Go to Patient Education Activity   Goal: Patient/Family  Education   Outcome: Resolved/Met       Problem: Falls - Risk of   Goal: *Absence of Falls   Description: Document Schmid Fall Risk and appropriate interventions in the flowsheet.   01/12/2022 1605 by Estill Bamberg   Outcome: Resolved/Met   Note: Fall Risk Interventions:                                         01/12/2022 1456 by Estill Bamberg   Outcome: Progressing Towards Goal   Note: Fall Risk Interventions:                                             Problem: Patient Education: Go to Patient Education Activity   Goal: Patient/Family  Education   Outcome: Resolved/Met       Problem: Patient Education: Go to Patient Education Activity   Goal: Patient/Family Education   Outcome: Resolved/Met       Problem: Patient Education: Go to Patient Education Activity   Goal: Patient/Family Education   Outcome: Resolved/Met

## 2022-01-12 NOTE — Progress Notes (Signed)
Progress Notes by Maureen Chatters, PT, DPT at 01/12/22 1227                Author: Maureen Chatters, PT, DPT  Service: Physical Therapy  Author Type: Physical Therapist       Filed: 01/12/22 1247  Date of Service: 01/12/22 1227  Status: Addendum          Editor: Maureen Chatters, PT, DPT (Physical Therapist)          Related Notes: Original Note by Maureen Chatters, PT, DPT (Physical Therapist) filed at 01/12/22  1227                  Problem: Mobility Impaired (Adult and Pediatric)   Goal: *Acute Goals and Plan of Care (Insert Text)   Description: FUNCTIONAL STATUS PRIOR TO ADMISSION: Patient was modified independent using a rollator and rolling walker for functional mobility.     HOME SUPPORT PRIOR TO ADMISSION: The patient lived with spouse but did not require assist.    Physical Therapy Goals  Initiated 01/11/2022  1.  Patient will move from supine to sit and sit to supine , scoot up and down, and roll side to  side in bed with independence within 7 day(s).    2.  Patient will transfer from bed to chair and chair to bed with modified independence using the least restrictive device within 7 day(s).  3.  Patient will perform sit to stand with modified  independence within 7 day(s).  4.  Patient will ambulate with modified independence for 200 feet with the least restrictive device within 7 day(s).   5.  Patient will ascend/descend 4 stairs with  handrail(s) with modified independence within  7 day(s).    Note:    PHYSICAL THERAPY TREATMENT   Patient: Mason Fields (88 y.o. male)   Date: 01/12/2022   Diagnosis: Rib fractures [S22.49XA] <principal problem not specified>        Precautions:     Chart, physical therapy assessment, plan of care and goals were reviewed.        ASSESSMENT   Patient continues with skilled PT services and is progressing towards goals. Nurse cleared patient to work with PT and patient agreeable. Upon arrival patient up in recliner chair setting off chair  alarm as patient trying  to scoot out of recliner with legrest elevated because Fields wished to use restroom. Assisted to and from bathroom and monitored with close supervision due to AMS/dementia and impulsivity. Assisted with change of adult diaper as incontinent  of urine. Patient with hx of frequent falls and these likely due to inability to multitask and distracted so easily. More related to cognitive deficits and impaired judgement attempting to multitask and unable. Mason Fields Fields is covered with bruises  and open skin sores. Fields reports some of sores are from him picking at areas. Dime size area on lower lobe of left ear with depth of pencil eraser and dried blood. No dressing. Up to chair with chair alarm and PCT in to measure vitals. Recommend SNF       Current Level of Function Impacting Discharge (mobility/balance): Patient amb 39' with RW and does not maintain COG within RW frame and at times pushes RW to the side when Fields gets distracted.      Other factors to consider for discharge: dementia, wife ability to manage care at home, advanced age  PLAN :   Patient continues to benefit from skilled intervention to address the above impairments.  Continue treatment per established plan of care.   to address goals.      Recommendation for discharge: (in order for the patient to meet his/her long term goals)   Therapy up to 5 days/week in SNF setting or HHPT and 24/7 supervision and assistance for safety      This discharge recommendation:   Has not yet been discussed the attending provider and/or case management      IF patient discharges home will need the following DME: none             SUBJECTIVE:     Patient stated you've been so helpful.        OBJECTIVE DATA SUMMARY:     Critical Behavior:   Neurologic State: Alert   Orientation Level: Oriented X4   Cognition: Appropriate for age attention/concentration   Safety/Judgement: Awareness of environment   Functional Mobility Training:   Bed Mobility:   Rolling:  (patient in  a chair on arrival)                       Transfers:   Sit to Stand: Contact guard assistance   Stand to Sit: Contact guard assistance   Stand Pivot Transfers: Contact guard assistance      Bed to Chair: Contact guard assistance                          Balance:   Sitting: Intact;Without support   Standing: Impaired   Standing - Static: Good;Unsupported   Standing - Dynamic : Constant support;Good   Ambulation/Gait Training:   Distance (ft): 35 Feet (ft)   Assistive Device: Gait belt;Walker, rolling   Ambulation - Level of Assistance: Contact guard assistance           Gait Abnormalities: Antalgic;Path deviations           Base of Support: Narrowed       Speed/Cadence: Fluctuations   Step Length: Left shortened;Right shortened          Pain Rating:   Reports right sided lateral rib pain but unable to rate "it's not bad"      Activity Tolerance:    Good- asymptomatic      After treatment patient left in no apparent distress:    Sitting in chair, Call bell within reach, Bed / chair alarm activated, and Caregiver / family present        COMMUNICATION/COLLABORATION:     The patients plan of care was discussed with: Registered nurse and Certified nursing assistant/patient care technician.       Maureen Chatters, PT, DPT    Time Calculation: 24 mins

## 2022-01-12 NOTE — Progress Notes (Signed)
Medicare pt has received, reviewed, and signed IM letter informing them of their right to appeal the discharge.  Signed copy has been placed on pt  chart.  Filbert Schilder CMS

## 2022-01-12 NOTE — Consults (Signed)
ORTHOPAEDIC CONSULT NOTE    Subjective:     Date of Consultation:  January 12, 2022      Mason Fields is a 88 y.o. male who is being seen for findings of superior endplate compression fracture per MRI, of note Ortho was consulted yesterday for rib fractures for this pt, which Orthopedic Surgery does not manage rib fractures. Per review of the chart and discussion with pt he has been up with PT and reports no back pain at this time, no c/o leg pain, numbness/tingling, no bowel/bladder incontinence. Reports hx of compression fractures int he past. Pt plans for go home this afternoon.     Patient Active Problem List    Diagnosis Date Noted    Rib fractures 01/11/2022     History reviewed. No pertinent family history.   Social History     Tobacco Use    Smoking status: Never     Passive exposure: Never    Smokeless tobacco: Never   Substance Use Topics    Alcohol use: Not on file     Past Medical History:   Diagnosis Date    A-fib ([place])     CHF (congestive heart failure) ([place])     Hypercholesteremia     Hypothyroid       Past Surgical History:   Procedure Laterality Date    HX CORONARY STENT PLACEMENT      10 years ago      Prior to Admission medications    Medication Sig Start Date End Date Taking? Authorizing Provider   warfarin (COUMADIN) 6 mg tablet Take 1 Tablet by mouth daily.   Yes Provider, Historical   furosemide (LASIX) 40 mg tablet Take 1 Tablet by mouth daily.   Yes Other, Phys, MD   DULoxetine (CYMBALTA) 60 mg capsule Take 1 Capsule by mouth daily.   Yes Other, Phys, MD   levothyroxine (SYNTHROID) 50 mcg tablet Take  by mouth Daily (before breakfast).   Yes Other, Phys, MD   amiodarone (CORDARONE) 200 mg tablet Take 0.5 Tablets by mouth. Take 1/2 tab by mouth daily   Yes Other, Phys, MD   atorvastatin (LIPITOR) 80 mg tablet Take 1 Tablet by mouth daily.   Yes Other, Phys, MD   gabapentin (NEURONTIN) 300 mg capsule Take 1 Capsule by mouth three (3) times daily. Max Daily Amount: 900 mg.   Yes Other, Phys,  MD     Current Facility-Administered Medications   Medication Dose Route Frequency    sodium chloride (NS) flush 5-40 mL  5-40 mL IntraVENous Q8H    sodium chloride (NS) flush 5-40 mL  5-40 mL IntraVENous PRN    acetaminophen (TYLENOL) tablet 650 mg  650 mg Oral Q6H PRN    Or    acetaminophen (TYLENOL) suppository 650 mg  650 mg Rectal Q6H PRN    polyethylene glycol (MIRALAX) packet 17 g  17 g Oral DAILY PRN    ondansetron (ZOFRAN ODT) tablet 4 mg  4 mg Oral Q8H PRN    Or    ondansetron (ZOFRAN) injection 4 mg  4 mg IntraVENous Q6H PRN    HYDROcodone-acetaminophen (NORCO) 5-325 mg per tablet 2 Tablet  2 Tablet Oral Q6H PRN    morphine injection 2 mg  2 mg IntraVENous Q3H PRN    amiodarone (CORDARONE) tablet 100 mg  100 mg Oral DAILY    atorvastatin (LIPITOR) tablet 80 mg  80 mg Oral DAILY    [Held by provider] furosemide (LASIX) tablet 40  mg  40 mg Oral DAILY    gabapentin (NEURONTIN) capsule 300 mg  300 mg Oral Q12H    levothyroxine (SYNTHROID) tablet 50 mcg  50 mcg Oral ACB    DULoxetine (CYMBALTA) capsule 30 mg  30 mg Oral QPM    lidocaine 4 % patch 1 Patch  1 Patch TransDERmal Q24H      No Known Allergies     Review of Systems:  A comprehensive review of systems was negative except for that written in the HPI.    Mental Status: no dementia    Objective:     Patient Vitals for the past 8 hrs:   BP Temp Pulse Resp SpO2   01/12/22 1147 115/74 98.2 F (36.8 C) 81 19 98 %   01/12/22 0758 122/74 98.2 F (36.8 C) 74 18 98 %     Temp (24hrs), Avg:97.9 F (36.6 C), Min:97.6 F (36.4 C), Max:98.2 F (36.8 C)      Gen: Well-developed,  in no acute distress   Musc: + ROM of bilat UE/LE, L spine NTTP, NVI   Skin: No skin breakdown noted. Skin warm, pink, dry  Neuro: Cranial nerves are grossly intact, no focal motor weakness, follows commands appropriately   Psych: Good insight, oriented to person, place and time, alert    Imaging Review: COMPARISON: None     TECHNIQUE: MR imaging of the lumbar spine was performed using  the following  sequences: sagittal T1, T2, STIR;  axial T1, T2.      CONTRAST:  None.     FINDINGS:     There is a minimal superior endplate compression fracture at L3 with trace edema  along the superior margin of the L2 vertebral body. Chronic vertebral  compression fractures at T12-L1. No cortical retropulsion. No epidural hematoma.  Vertebral body heights are maintained. There is no focal marrow lesion. Left  renal cyst. Mild bladder distention.  There is a lipoma of the filum.  The conus medullaris terminates at L1-L2. Signal and caliber of the distal  spinal cord are within normal limits.      The paraspinal soft tissues are within normal limits.     Lower thoracic spine: No herniation or stenosis.     L1-L2: Facet arthropathy. Ligament flavum hypertrophy. Disc bulge. The canal is  patent. Mild left foraminal stenosis and mild right foraminal stenosis.     L2-L3: There is a hemangioma in the pedicle on the right. Moderate facet  arthropathy. Ligament flavum hypertrophy. Circumferential disc bulge. Severe  canal and moderate bilateral foraminal stenosis.     L3-L4: Mild facet arthropathy. Ligament flavum hypertrophy. Dumbbell-shaped disc  protrusion. The canal is patent. There is mild to moderate bilateral foraminal  stenosis.     L4-L5: Mild facet arthropathy. Disc desiccation. Circumferential protrusion. The  canal is patent. Moderate to severe left and mild to moderate right foraminal  stenosis     L5-S1: Mild facet arthropathy. Circumferential protrusion greatest at the right  neural foramen. There is minimal canal stenosis. Moderate to severe right and  mild left foraminal stenosis. Effacement of nerve roots extending to the right  S1 foramen.     IMPRESSION  Multilevel disc and facet degenerative change with congenital narrowing of the  lumbar canal.     There is severe canal moderate bilateral foraminal stenosis at L2-3.     Moderate to severe left and mild to moderate right foraminal stenosis at L4-5.      Moderate to severe right  and mild left foraminal stenosis at L5-S1.     Chronic vertebral compression fractures at T12 and L1 and trace acute superior  endplate compression fracture at L3 with minimal marrow edema along the superior  endplate of L3.    Labs:   Recent Results (from the past 24 hour(s))   TSH 3RD GENERATION    Collection Time: 01/12/22  2:15 AM   Result Value Ref Range    TSH 8.04 (H) 0.36 - 4.09 uIU/mL   METABOLIC PANEL, BASIC    Collection Time: 01/12/22  2:15 AM   Result Value Ref Range    Sodium 137 136 - 145 mmol/L    Potassium 3.2 (L) 3.5 - 5.1 mmol/L    Chloride 104 97 - 108 mmol/L    CO2 27 21 - 32 mmol/L    Anion gap 6 5 - 15 mmol/L    Glucose 98 65 - 100 mg/dL    BUN 31 (H) 6 - 20 MG/DL    Creatinine 1.87 (H) 0.70 - 1.30 MG/DL    BUN/Creatinine ratio 17 12 - 20      eGFR 34 (L) >60 ml/min/1.73m    Calcium 7.7 (L) 8.5 - 10.1 MG/DL   PROTHROMBIN TIME + INR    Collection Time: 01/12/22  2:15 AM   Result Value Ref Range    INR 1.5 (H) 0.9 - 1.1      Prothrombin time 15.3 (H) 9.0 - 11.1 sec   SAMPLES BEING HELD    Collection Time: 01/12/22  2:15 AM   Result Value Ref Range    SAMPLES BEING HELD 1PST     COMMENT        Add-on orders for these samples will be processed based on acceptable specimen integrity and analyte stability, which may vary by analyte.         Impression:     Patient Active Problem List    Diagnosis Date Noted    Rib fractures 01/11/2022     Active Problems:    Rib fractures (01/11/2022)        Plan:   -  Pt is stable orthopaedically, Non-Operative management at this time  -  Acute superior endplate compression fracture at L3/ lumbar stenosis - no back pain at this time, no radicular pain or symptoms, continue conservative management with up as tolerated and pain management PRN, pt may follow upwith Dr. CLarey SeatPRN  -  of note chronic compression fractures of T12 and L1   -  managment of rib fractures as per medicine     Dr. CLarey Seataware and agrees with plan as above.         JBella Kennedy NP  Orthopedic Nurse Practitioner   [facility]

## 2022-01-12 NOTE — Progress Notes (Signed)
Progress Notes signed by Horald Chestnut, DO at 01/12/22 2017                 Author: Horald Chestnut, DO  Service: Internal Medicine  Author Type: Physician       Filed: 01/12/22 2017  Date of Service: 01/12/22 1644  Status: Signed          Editor: Horald Chestnut, DO (Physician)                  Physician Progress Note         PATIENT:               Mason Fields, Mason Fields   CSN #:                  [id number]   DOB:                       01-14-1929   ADMIT DATE:       01/10/2022 8:23 PM   DISCH DATE:        01/12/2022 4:44 PM   RESPONDING   PROVIDER #:        Arna Medici DO [name] DO               QUERY TEXT:      Good afternoon.      Patient admitted with T11-T12 posterior rib fractures following fall, noted to have atrial fibrillation and is maintained on Coumadin.      If possible, please document in progress notes and discharge summary if you are evaluating and/or treating any of the following:         The medical record reflects the following:      Risk Factors: Atrial fibrillation, hypothyroidism, CHF      Clinical Indicators: INR 7.9, 3.7, 1.5; PT 72.0, 35.7, 15.3; hgb 12.0, 11.0; hct 32.7, 33.8; plt 138, 143; from 04/28 Cardiology consult note: hx chronic a fib: has been on warfarin for many years originally placed on warfarin. Cont amio for rate control.  He is covered and bruises and multiple lacerations some of which are actively oozing blood. He also sustained rib fractures. Can consider xarelto or eliquis OP if he is deemed safe in his environment. He has short term memory loss and PT has noted impulsivity.  Would hold off until the lacerations stop oozing; EKG: Atrial flutter with variable AV block Left axis deviation Low voltage QRS  Inferior infarct , age undetermined Cannot rule out Anterior infarct , age undetermined Abnormal ECG; HR 60s-100         Treatment: labs, EKG, Cardiology consult, vital signs per unit protocol         Thank you,   Maryln Gottron, RN, CDI   Options provided:   -- Secondary hypercoagulable  state in a patient with atrial fibrillation   -- Other - I will add my own diagnosis   -- Disagree - Not applicable / Not valid   -- Disagree - Clinically unable to determine / Unknown   -- Refer to Clinical Documentation Reviewer      PROVIDER RESPONSE TEXT:      This patient has secondary hypercoagulable state in a patient with atrial fibrillation.      Query created by: Maryln Gottron on 01/12/2022 2:55 PM         Electronically signed by:  Arna Medici DO Nuh Lipton DO 01/12/2022 8:15 PM

## 2022-01-12 NOTE — Progress Notes (Signed)
Progress  Notes by Dorthula Nettles at 01/12/22 1638                Author: Dorthula Nettles  Service: --  Author Type: Registered Nurse       Filed: 01/12/22 1638  Date of Service: 01/12/22 1638  Status: Signed          Editor: Dorthula Nettles (Registered Nurse)               RN discussed discharge paper work with pt, all questions/concerns answered.   IV removed    All pt belongings given to patient

## 2022-01-12 NOTE — Discharge Summary (Signed)
[facility]  [address] [place], [place] [address]  [phone]    Physician Discharge Summary     Patient ID:  Mason Fields  [id number]  88 y.o.  Apr 29, 1929    Admit date: 01/10/2022    Discharge date and time: 01/12/2022 3:42 PM    Admission Diagnoses: Rib fractures [S22.49XA]    Discharge Diagnoses:  Principal Diagnosis <principal problem not specified>                                            Active Problems:    Rib fractures (01/11/2022)           Hospital Course:     Acute T11 and T12 posterior rib fractures POA: No hypoxia.  Pain control with lidocaine patch.  Tylenol as needed.  Encourage incentive spirometry. Fu PCP OP      Acute L3 compression/ Spinal Stenosis POA: Does endorse some associated back pain w/ ambulation.  MRI thoracic and MRI lumbar as below.  Lidocaine patch.  Tylenol as needed.   Ortho evaluated; Fu OP      Fall POA: Does not appear to be cardiac or neurological prodrome.  PT OT on DC.     Aki or possible CKD: unclear baseline. Hold lasix for now due to Bps on lower end. UA unremarkable. Fu OP      Supratherapeutic INR POA: INR 7.9 on admission. Status post vitamin K in ER. INR now 1.5. Cardiology evaluated hold NOAC for now due oozing from skin lesions. FU cardio OP      Atrial fibrillation: Currently rate controlled.  Continue home amiodarone.  Holding Coumadin due to above.     Hypothyroidism:  TSH elevated; t4 pending.  Continue home Synthroid.    Concern for cancer: lesions on face may be skin cancer. Advised to FU OP with dermatology     PCP: Ballard Russell, MD     Consults: Cardiology and Orthopedic Surgery    Significant Diagnostic Studies:     MRI Thoracic   IMPRESSION  1.  No acute compression fracture. Chronic appearing T12 superior endplate  compression deformity.   2.  Redemonstrated acute posterior right 11th and 12th rib fractures. Partially  visualized hematoma adjacent to the 12th rib fracture.  3.  Stable 4 mm low signal focus in the left  posterior lateral aspect of the  thecal sac at the T10 level (10-50) correlating to calcific density on prior CT,  favoring a small meningioma versus dural calcification.  4.  Small right pleural effusion.  5.  MRI lumbar spine dictated separately.    MRI lumbar   IMPRESSION  Multilevel disc and facet degenerative change with congenital narrowing of the  lumbar canal.     There is severe canal moderate bilateral foraminal stenosis at L2-3.     Moderate to severe left and mild to moderate right foraminal stenosis at L4-5.     Moderate to severe right and mild left foraminal stenosis at L5-S1.     Chronic vertebral compression fractures at T12 and L1 and trace acute superior  endplate compression fracture at L3 with minimal marrow edema along the superior  endplate of L3.    Discharge Exam:  Physical Exam:  Gen:  elderly, in no acute distress  HEENT:  Pink conjunctivae, PERRL, hearing intact to voice  Resp:  No  accessory muscle use, clear breath sounds without wheezes rales or rhonchi  Card:  RRR, No murmurs, normal S1, S2, no peripheral edema  Abd:  Soft, non-tender, non-distended, normoactive bowel sounds are present  Musc:  No cyanosis or clubbing  Skin:  abrasions of face   Neuro:  Cranial nerves 3-12 are grossly intact, follows commands appropriately  Psych:  Oriented to person, place, and time, Alert with good insight       Disposition: home  Discharge Condition: Stable    Patient Instructions:   Current Discharge Medication List        START taking these medications    Details   lidocaine 4 % patch Apply patch to affected area .  Apply patch to the affected area for 12 hours a day and remove for 12 hours a day.  Qty: 7 Patch, Refills: 0      acetaminophen (TYLENOL) 325 mg tablet Take 2 Tablets by mouth every six (6) hours as needed for Pain.  Qty: 30 Tablet, Refills: 0           CONTINUE these medications which have CHANGED    Details   gabapentin (NEURONTIN) 300 mg capsule Take 1 Capsule by mouth every twelve  (12) hours. Max Daily Amount: 600 mg.  Qty: 30 Capsule, Refills: 0    Associated Diagnoses: Other chronic pain      DULoxetine (CYMBALTA) 30 mg capsule Take 1 Capsule by mouth every evening.  Qty: 30 Capsule, Refills: 0           CONTINUE these medications which have NOT CHANGED    Details   levothyroxine (SYNTHROID) 50 mcg tablet Take  by mouth Daily (before breakfast).      amiodarone (CORDARONE) 200 mg tablet Take 0.5 Tablets by mouth. Take 1/2 tab by mouth daily      atorvastatin (LIPITOR) 80 mg tablet Take 1 Tablet by mouth daily.           STOP taking these medications       warfarin (COUMADIN) 6 mg tablet Comments:   Reason for Stopping:         furosemide (LASIX) 40 mg tablet Comments:   Reason for Stopping:             Activity: per orthopedics and physical/ occupational therapy   Diet: Cardiac Diet  Wound Care: Keep wound clean and dry    Follow-up with  Follow-up Information       Follow up With Specialties Details Why Contact Info    [facility] Follow up Home Health, If you haven't recieved a phone call within 24, hours. Please contact the agency directly. [address] [place] [address]  [phone]    Ballard Russell, MD Family Medicine Schedule an appointment as soon as possible for a visit in 1 week(s) Hospital Follow Up [address]  [place] [address]  [phone]      Dermatology  Follow up Please follow up a dermatologist as soon as possible due to concern that you may have skin cancer.     Jonna Clark, MD Orthopedic Surgery Schedule an appointment as soon as possible for a visit in 2 week(s) Hospital Follow Up [address]  [address]  [place] [address]  [phone]      Rushie Chestnut, MD Specialist Undefined, Cardiovascular Disease Physician Follow up on 01/17/2022 3:20 pm [address] [place]  [address]  [place] [address]  [phone]  Follow-up tests/labs as above.     Signed:  Haskel Schroeder,  DO  01/12/2022  3:42 PM  **I personally spent 35 min on discharge**

## 2022-01-12 NOTE — Progress Notes (Signed)
Progress  Notes by Laddie Aquas, MD at 01/12/22 1256                Author: Laddie Aquas, MD  Service: Cardiology  Author Type: Physician       Filed: 01/12/22 1704  Date of Service: 01/12/22 1256  Status: Addendum          Editor: Laddie Aquas, MD (Physician)          Related Notes: Original Note by Vincent Peyer, NP (Nurse Practitioner) filed at 01/12/22  1340                                                                                     [facility]                     Cardiology Care Note       [x] Initial Encounter      [] Follow-up        Patient Name: Mason Fields - DOB:09-Apr-1929 - [place]   Primary Cardiologist:    Consulting Cardiologist: 13/03/1929 Cardiology Physicians: [id number] MD        Reason for encounter: recommendations on DOAC, admitted with supratherapeutic INR.       HPI:         Mason Fields is a 88 y.o. male with PMH significant for  a fib on coumadin, CHF,memory impairment who was admitted for falls.       He was walking through the back patio door when he tripped and fell.  He did not lose consciousness but did require the assistance of his wife to get up.        He began to experience right sided posterior rib pain which is a severe excruciating pain that is worse w/ any movement.  Later in the evening while he tried walking to the bathroom he fell down again due to the intensity of his pain.  He has no chest  pain, palpitations, coughing, wheezing or dyspnea.       INR over 7 on admission       He has multiple laceration on head, face, chin as well as extensive ecchymosis on extremities.       Pt is a poor historian. He can not recall what treatments or testing he has received for his a fib .       No family at bedside.     PTA list includes amiodarone 100 mg every day.      He moved here 5 months ago from [place] with his wife and has not established with a local cardiologist.             Subjective:        95 reports R sided  rib pain          Assessment and Plan          1 hx chronic a fib: has been on warfarin for many years originally placed on warfarin. Cont amio for rate control. He is covered and bruises and multiple  lacerations some of which are actively oozing blood. He also sustained rib fractures. Can  consider xarelto or eliquis OP if he is deemed safe in his environment. He has short term memory loss and PT has noted impulsivity. Would hold off until the lacerations  stop oozing.        2 CHF unknown EF: not in exacerbation.       3.  falls with fractures      4 debility: PT/OT recommends SNF.       I will arrange an appt in our office to establish care and address DOAC again.    We will need records from his previous cardiologist when family provides name of MD.          CARDIOLOGY ATTENDING   Assessment and plan was discussed and agree.        Patient was discharged before I could see him.       Eliquis 2.5 mg BID would be a better option than coumadin for him (since age > 7180 and Cr > 1.5)                   Tharon AquasShakil A. Mitchelle Sultan, MD, [facility]                 ____________________________________________________________      Cardiac testing                     Most recent HS troponins:     Recent Labs           01/10/22   2102        TROPHS  67        EKG:      EKG Results                  Procedure  720  Value  Units  Date/Time           EKG 12 LEAD INITIAL [[id number]]  Collected: 01/10/22 2046       Order Status: Completed  Updated: 01/11/22 0904                Ventricular Rate  82  BPM           Atrial Rate  344  BPM           QRS Duration  104  ms           Q-T Interval  438  ms           QTC Calculation (Bezet)  511  ms           Calculated R Axis  -41  degrees           Calculated T Axis  117  degrees                Diagnosis  --             Atrial flutter with variable AV block   Left axis deviation   Low voltage QRS   Inferior infarct , age undetermined   Cannot rule out Anterior infarct , age undetermined   Abnormal ECG   No  previous ECGs available                           Review of Systems:      [x] All other systems reviewed and all negative except as written in HPI      []  Patient unable to provide secondary  to condition  Past Medical History:        Diagnosis  Date         ?  A-fib (HCC)       ?  CHF (congestive heart failure) (HCC)       ?  Hypercholesteremia           ?  Hypothyroid            Past Surgical History:         Procedure  Laterality  Date          ?  HX CORONARY STENT PLACEMENT              10 years ago        Social Hx:  reports that he has never smoked. He has never been exposed to tobacco smoke. He has never used smokeless tobacco. He reports that he does not use drugs.   Family Hx: family history is not on file.   No Known Allergies         OBJECTIVE:     Wt Readings from Last 3 Encounters:        01/10/22  74.8 kg (165 lb)           Intake/Output Summary (Last 24 hours) at 01/12/2022 1257   Last data filed at 01/12/2022 0700     Gross per 24 hour        Intake  540 ml        Output  --        Net  540 ml           Physical Exam:      Vitals:      Vitals:             01/11/22 1952  01/12/22 0514  01/12/22 0758  01/12/22 1147           BP:  (!) 94/53  (!) 110/58  122/74  115/74     Pulse:  82  76  74  81     Resp:  18  18  18  19      Temp:    97.7 F (36.5 C)  98.2 F (36.8 C)  98.2 F (36.8 C)     SpO2:  96%  97%  98%  98%     Weight:                   Height:                Telemetry: not on       Gen: Elderly, frail    Neck: Supple, No JVD, No Carotid Bruit   Resp: No accessory muscle use, Clear breath sounds, No rales or rhonchi   Card: irregular Rate,Rythm, Normal S1, S2, No murmurs, rubs or gallop. No thrills.    Abd:   Soft, non-tender, non-distended, BS+    MSK: No cyanosis   Skin: Multiple areas of ecchymosis, oozing lacerations face, head , chin, arms.    Neuro: Moving all four extremities, follows commands appropriately   Psych: Good insight, oriented to person, place, alert, Nml Affect   LE: No  edema      Data Review:       Radiology:    XR Results (most recent):   No results found for this or any previous visit.           Recent Labs  01/12/22   0215  01/10/22   2102     NA  137  135*     K  3.2*  4.1     CL  104  104     CO2  27  25     BUN  31*  20     CREA  1.87*  1.81*     GLU  98  97         CA  7.7*  8.2*          Recent Labs            01/11/22   0424  01/10/22   2102     WBC  6.0  6.1     HGB  11.0*  12.0*     HCT  33.8*  32.7*         PLT  143*  138*          Recent Labs             01/12/22   0215  01/11/22   0424  01/10/22   2102     PTP  15.3*  35.7*  72.0*     INR  1.5*  3.7*  7.9*          AP   --    --   94        No results for input(s): CHOL, LDLC in the last 72 hours.      No lab exists for component: TGL, HDLC,  HBA1C         Current meds:      Current Facility-Administered Medications:    ?  sodium chloride (NS) flush 5-40 mL, 5-40 mL, IntraVENous, Q8H, Ahmed, Kandis Mannan, MD, 10 mL at 01/12/22 0515   ?  sodium chloride (NS) flush 5-40 mL, 5-40 mL, IntraVENous, PRN, Vira Browns, MD   ?  acetaminophen (TYLENOL) tablet 650 mg, 650 mg, Oral, Q6H PRN **OR** acetaminophen (TYLENOL) suppository 650 mg, 650 mg, Rectal, Q6H PRN, Vira Browns, MD   ?  polyethylene glycol (MIRALAX) packet 17 g, 17 g, Oral, DAILY PRN, Vira Browns, MD   ?  ondansetron (ZOFRAN ODT) tablet 4 mg, 4 mg, Oral, Q8H PRN **OR** ondansetron (ZOFRAN) injection 4 mg, 4 mg, IntraVENous, Q6H PRN, Vira Browns, MD   ?  HYDROcodone-acetaminophen (NORCO) 5-325 mg per tablet 2 Tablet, 2 Tablet, Oral, Q6H PRN, Vira Browns, MD, 2 Tablet at 01/11/22 1853   ?  morphine injection 2 mg, 2 mg, IntraVENous, Q3H PRN, Vira Browns, MD   ?  amiodarone (CORDARONE) tablet 100 mg, 100 mg, Oral, DAILY, Vira Browns, MD, 100 mg at 01/12/22 0948   ?  atorvastatin (LIPITOR) tablet 80 mg, 80 mg, Oral, DAILY, Vira Browns, MD, 80 mg at 01/12/22 0948   ?  [Held by provider] furosemide (LASIX) tablet 40 mg, 40 mg, Oral, DAILY, Vira Browns, MD, 40 mg at  01/11/22 0947   ?  gabapentin (NEURONTIN) capsule 300 mg, 300 mg, Oral, Q12H, Vira Browns, MD, 300 mg at 01/12/22 0948   ?  levothyroxine (SYNTHROID) tablet 50 mcg, 50 mcg, Oral, ACB, Vira Browns, MD, 50 mcg at 01/12/22 0805   ?  DULoxetine (CYMBALTA) capsule 30 mg, 30 mg, Oral, QPM, Jamil, Nora, DO, 30 mg at 01/11/22 1851   ?  lidocaine 4 % patch 1 Patch, 1 Patch, TransDERmal, Q24H, Jamil, Nora, DO, 1 Patch at 01/11/22 1538      Stephanie A  Myrle Sheng, NP      [facility]   Call center: [phone]  [phone]         CC:Nuala Alpha, MD

## 2022-01-12 NOTE — Consults (Signed)
Consults  by Vincent Peyer, NP at 01/12/22 1258                Author: Vincent Peyer, NP  Service: Cardiology  Author Type: Nurse Practitioner       Filed: 01/12/22 1258  Date of Service: 01/12/22 1258  Status: Signed          Editor: Vincent Peyer, NP (Nurse Practitioner)            Consult Orders        1. IP CONSULT TO CARDIOLOGY [[id number]] ordered by Horald Chestnut, DO at 01/12/22 1133                              See initial care note

## 2022-01-12 NOTE — Progress Notes (Signed)
01/12/2022   Care Management Progress Note      ICD-10-CM ICD-9-CM    1. Closed fracture of multiple ribs of right side, initial encounter  S22.41XA 807.09       2. Small pleural effusion  J90 511.9       3. Supratherapeutic INR  R79.1 790.92       4. Fall, initial encounter  W19.XXXA E888.9       5. Chin laceration, initial encounter  S01.81XA 873.44       6. Elevated serum creatinine  R79.89 790.99           RUR: 14%   Risk Level: '[x]' Low '[]' Moderate '[]' High  Value-based purchasing: '[]'  Yes '[x]'  No  Bundle patient: '[]'  Yes '[x]'  No   Specify:     Transition of care plan:  Ongoing medical management   Home with family  Home health PT/OT/RN arranged through [facility]   Outpatient follow-up.  Pt's family to transport    3:45 PM [place] was sent pt's discharge clinicals and notified of pt's discharge home today via [employer].     1:15 PM EMR reviewed noted, home health vs SNF. Met with pt and pt's wife to discuss, they prefer home with home health. Choice provided, choice letter signed for [facility].   [place] was sent referral via [employer]. [facility] accepted pt.   Evonnie Dawes, BSW     Care Management Interventions  PCP Verified by CM: Yes  Transition of Care Consult (CM Consult): [place]: No  Reason Outside [place]: Patient already serviced by other home care/hospice agency  MyChart Signup: No  Discharge Durable Medical Equipment: No  Physical Therapy Consult: Yes  Occupational Therapy Consult: Yes  Speech Therapy Consult: No  Support Systems: Spouse/Significant Other  The Plan for Transition of Care is Related to the Following Treatment Goals : home health  The Patient and/or Patient Representative was Provided with a Choice of Provider and Agrees with the Discharge Plan?: Yes  Freedom of Choice List was Provided with Basic Dialogue that Supports the Patient's Individualized Plan of Care/Goals, Treatment Preferences  and Shares the Quality Data Associated with the Providers?: Yes  Discharge Location  Patient Expects to be Discharged to:: Home with home health

## 2022-01-13 ENCOUNTER — Inpatient Hospital Stay
Admit: 2022-01-13 | Discharge: 2022-01-13 | Disposition: A | Discharge status: Home / Self Care | Payer: MEDICARE | Attending: Emergency Medicine

## 2022-01-13 ENCOUNTER — Emergency Department: Admit: 2022-01-13 | Payer: MEDICARE | Primary: Family Medicine

## 2022-01-13 DIAGNOSIS — I951 Orthostatic hypotension: Secondary | ICD-10-CM

## 2022-01-13 DIAGNOSIS — E7800 Pure hypercholesterolemia, unspecified: Secondary | ICD-10-CM | POA: Diagnosis not present

## 2022-01-13 DIAGNOSIS — Z955 Presence of coronary angioplasty implant and graft: Secondary | ICD-10-CM | POA: Diagnosis not present

## 2022-01-13 DIAGNOSIS — J90 Pleural effusion, not elsewhere classified: Secondary | ICD-10-CM | POA: Diagnosis not present

## 2022-01-13 DIAGNOSIS — I509 Heart failure, unspecified: Secondary | ICD-10-CM | POA: Diagnosis not present

## 2022-01-13 DIAGNOSIS — E039 Hypothyroidism, unspecified: Secondary | ICD-10-CM | POA: Diagnosis not present

## 2022-01-13 DIAGNOSIS — I4891 Unspecified atrial fibrillation: Secondary | ICD-10-CM | POA: Diagnosis not present

## 2022-01-13 LAB — CBC WITH AUTOMATED DIFF
ABS. BASOPHILS: 0 10*3/uL (ref 0.0–0.1)
ABS. EOSINOPHILS: 0.2 10*3/uL (ref 0.0–0.4)
ABS. IMM. GRANS.: 0 10*3/uL (ref 0.00–0.04)
ABS. LYMPHOCYTES: 1 10*3/uL (ref 0.8–3.5)
ABS. MONOCYTES: 0.6 10*3/uL (ref 0.0–1.0)
ABS. NEUTROPHILS: 3.8 10*3/uL (ref 1.8–8.0)
ABSOLUTE NRBC: 0 10*3/uL (ref 0.00–0.01)
BASOPHILS: 1 % (ref 0–1)
EOSINOPHILS: 4 % (ref 0–7)
HCT: 33.5 % — ABNORMAL LOW (ref 36.6–50.3)
HGB: 10.7 g/dL — ABNORMAL LOW (ref 12.1–17.0)
IMMATURE GRANULOCYTES: 1 % — ABNORMAL HIGH (ref 0.0–0.5)
LYMPHOCYTES: 17 % (ref 12–49)
MCH: 30 PG (ref 26.0–34.0)
MCHC: 31.9 g/dL (ref 30.0–36.5)
MCV: 93.8 FL (ref 80.0–99.0)
MONOCYTES: 11 % (ref 5–13)
MPV: 9.4 FL (ref 8.9–12.9)
NEUTROPHILS: 66 % (ref 32–75)
NRBC: 0 PER 100 WBC
PLATELET: 136 10*3/uL — ABNORMAL LOW (ref 150–400)
RBC: 3.57 M/uL — ABNORMAL LOW (ref 4.10–5.70)
RDW: 17.1 % — ABNORMAL HIGH (ref 11.5–14.5)
WBC: 5.7 10*3/uL (ref 4.1–11.1)

## 2022-01-13 LAB — MAGNESIUM
Magnesium: 2 mg/dL (ref 1.6–2.4)
Magnesium: 2 mg/dL (ref 1.6–2.4)

## 2022-01-13 LAB — NT-PRO BNP: NT pro-BNP: 5209 PG/ML — ABNORMAL HIGH (ref <450)

## 2022-01-13 LAB — METABOLIC PANEL, COMPREHENSIVE
A-G Ratio: 0.6 — ABNORMAL LOW (ref 1.1–2.2)
ALT (SGPT): 12 U/L (ref 12–78)
AST (SGOT): 20 U/L (ref 15–37)
Albumin: 2.6 g/dL — ABNORMAL LOW (ref 3.5–5.0)
Alk. phosphatase: 86 U/L (ref 45–117)
Anion gap: 2 mmol/L — ABNORMAL LOW (ref 5–15)
BUN/Creatinine ratio: 15 (ref 12–20)
BUN: 31 MG/DL — ABNORMAL HIGH (ref 6–20)
Bilirubin, total: 1 MG/DL (ref 0.2–1.0)
CO2: 30 mmol/L (ref 21–32)
Calcium: 8 MG/DL — ABNORMAL LOW (ref 8.5–10.1)
Chloride: 105 mmol/L (ref 97–108)
Creatinine: 2.03 MG/DL — ABNORMAL HIGH (ref 0.70–1.30)
Globulin: 4.1 g/dL — ABNORMAL HIGH (ref 2.0–4.0)
Glucose: 104 mg/dL — ABNORMAL HIGH (ref 65–100)
Potassium: 4.3 mmol/L (ref 3.5–5.1)
Protein, total: 6.7 g/dL (ref 6.4–8.2)
Sodium: 137 mmol/L (ref 136–145)
eGFR: 31 mL/min/{1.73_m2} — ABNORMAL LOW (ref >60)

## 2022-01-13 LAB — TROPONIN-HIGH SENSITIVITY: Troponin-High Sensitivity: 84 ng/L — ABNORMAL HIGH (ref 0–76)

## 2022-01-13 LAB — SAMPLES BEING HELD

## 2022-01-13 LAB — PROCALCITONIN
Procalcitonin: <0.05 ng/mL
Procalcitonin: <0.05 ng/mL

## 2022-01-13 LAB — GLUCOSE, POC: Glucose (POC): 112 mg/dL (ref 65–117)

## 2022-01-13 LAB — T4, FREE
T4 Free: 1 NG/DL (ref 0.8–1.5)
T4, Free: 1 NG/DL (ref 0.8–1.5)

## 2022-01-13 LAB — TSH 3RD GENERATION
TSH: 4.7 u[IU]/mL — ABNORMAL HIGH (ref 0.36–3.74)
TSH: 4.7 u[IU]/mL — ABNORMAL HIGH (ref 0.36–3.74)

## 2022-01-13 LAB — CBC WITH AUTO DIFFERENTIAL
Basophils %: 1 % (ref 0–1)
Basophils Absolute: 0 10*3/uL (ref 0.0–0.1)
Eosinophils %: 4 % (ref 0–7)
Eosinophils Absolute: 0.2 10*3/uL (ref 0.0–0.4)
Granulocyte Absolute Count: 0 10*3/uL (ref 0.00–0.04)
Hematocrit: 33.5 % — ABNORMAL LOW (ref 36.6–50.3)
Hemoglobin: 10.7 g/dL — ABNORMAL LOW (ref 12.1–17.0)
Immature Granulocytes: 1 % — ABNORMAL HIGH (ref 0.0–0.5)
Lymphocytes %: 17 % (ref 12–49)
Lymphocytes Absolute: 1 10*3/uL (ref 0.8–3.5)
MCH: 30 PG (ref 26.0–34.0)
MCHC: 31.9 g/dL (ref 30.0–36.5)
MCV: 93.8 FL (ref 80.0–99.0)
MPV: 9.4 FL (ref 8.9–12.9)
Monocytes %: 11 % (ref 5–13)
Monocytes Absolute: 0.6 10*3/uL (ref 0.0–1.0)
NRBC Absolute: 0 10*3/uL (ref 0.00–0.01)
Neutrophils %: 66 % (ref 32–75)
Neutrophils Absolute: 3.8 10*3/uL (ref 1.8–8.0)
Nucleated RBCs: 0 PER 100 WBC
Platelets: 136 10*3/uL — ABNORMAL LOW (ref 150–400)
RBC: 3.57 M/uL — ABNORMAL LOW (ref 4.10–5.70)
RDW: 17.1 % — ABNORMAL HIGH (ref 11.5–14.5)
WBC: 5.7 10*3/uL (ref 4.1–11.1)

## 2022-01-13 LAB — COMPREHENSIVE METABOLIC PANEL
ALT: 12 U/L (ref 12–78)
AST: 20 U/L (ref 15–37)
Albumin/Globulin Ratio: 0.6 — ABNORMAL LOW (ref 1.1–2.2)
Albumin: 2.6 g/dL — ABNORMAL LOW (ref 3.5–5.0)
Alkaline Phosphatase: 86 U/L (ref 45–117)
Anion Gap: 2 mmol/L — ABNORMAL LOW (ref 5–15)
BUN: 31 MG/DL — ABNORMAL HIGH (ref 6–20)
Bun/Cre Ratio: 15 (ref 12–20)
CO2: 30 mmol/L (ref 21–32)
Calcium: 8 MG/DL — ABNORMAL LOW (ref 8.5–10.1)
Chloride: 105 mmol/L (ref 97–108)
Creatinine: 2.03 MG/DL — ABNORMAL HIGH (ref 0.70–1.30)
ESTIMATED GLOMERULAR FILTRATION RATE: 31 mL/min/{1.73_m2} — ABNORMAL LOW (ref >60)
Globulin: 4.1 g/dL — ABNORMAL HIGH (ref 2.0–4.0)
Glucose: 104 mg/dL — ABNORMAL HIGH (ref 65–100)
Potassium: 4.3 mmol/L (ref 3.5–5.1)
Sodium: 137 mmol/L (ref 136–145)
Total Bilirubin: 1 MG/DL (ref 0.2–1.0)
Total Protein: 6.7 g/dL (ref 6.4–8.2)

## 2022-01-13 LAB — TROPONIN, HIGH SENSITIVITY: Troponin, High Sensitivity: 84 ng/L — ABNORMAL HIGH (ref 0–76)

## 2022-01-13 LAB — POCT GLUCOSE: POC Glucose: 112 mg/dL (ref 65–117)

## 2022-01-13 LAB — PROBNP, N-TERMINAL: BNP: 5209 PG/ML — ABNORMAL HIGH (ref <450)

## 2022-01-13 MED ORDER — DEXTROSE 5% IN WATER (D5W) IV
50 mg/mL | INTRAVENOUS | Status: DC
Start: 2022-01-13 — End: 2022-01-13

## 2022-01-13 MED ORDER — SODIUM CHLORIDE 0.9 % IJ SYRG
INTRAMUSCULAR | Status: DC | PRN
Start: 2022-01-13 — End: 2022-01-13

## 2022-01-13 MED ORDER — SODIUM CHLORIDE 0.9% BOLUS IV
0.9 % | Freq: Once | INTRAVENOUS | Status: AC
Start: 2022-01-13 — End: 2022-01-13
  Administered 2022-01-13: 21:00:00 via INTRAVENOUS

## 2022-01-13 MED FILL — AMIODARONE 50 MG/ML IV SOLN: 50 mg/mL | INTRAVENOUS | Qty: 9

## 2022-01-13 MED FILL — SODIUM CHLORIDE 0.9 % IV: INTRAVENOUS | Qty: 500

## 2022-01-13 MED FILL — BD POSIFLUSH NORMAL SALINE 0.9 % INJECTION SYRINGE: INTRAMUSCULAR | Qty: 10

## 2022-01-13 NOTE — ED Notes (Signed)
ED  Notes by Clovis Pu, RN at 01/13/22 1627                Author: Clovis Pu, RN  Service: --  Author Type: Registered Nurse       Filed: 01/13/22 1628  Date of Service: 01/13/22 1627  Status: Signed          Editor: Clovis Pu, RN (Registered Nurse)               Patient ambulated with walker independently.

## 2022-01-13 NOTE — ED Notes (Signed)
Verbal per Dr. Mallie Mussel to hold on giving amiodarone due to BP being 135/64 and HR being 78.

## 2022-01-13 NOTE — ED Notes (Signed)
Blood cultures collected by [facility] PCT and sent to lab.

## 2022-01-13 NOTE — ED Notes (Signed)
ED Triage  Notes by Henri Medal at 01/13/22 1329                Author: Henri Medal  Service: --  Author Type: Registered Nurse       Filed: 01/13/22 1333  Date of Service: 01/13/22 1329  Status: Addendum          Editor: Henri Medal (Registered Nurse)          Related Notes: Original Note by Henri Medal (Registered Nurse) filed at 01/13/22 1332               Patient arrives with c/o low blood pressure. Patient states was recently diagnosed with two broken ribs on right side this past week, following fall.       States home health nurse came today and saw BP: 80/46, with HR ranging 50 - 120 at home.       Patient's BP in triage; 82/44, with HR: 75. Dr. Sherilyn Cooter in triage assessing patient.       Patient is A&O x 4.       Presents with bandaged abrasion on left side of face.      Hx of CHF, and A-fib

## 2022-01-13 NOTE — ED Provider Notes (Signed)
ED Provider Notes by Jorje Guild, MD at 01/13/22 1332                Author: Jorje Guild, MD  Service: --  Author Type: Physician       Filed: 01/13/22 2036  Date of Service: 01/13/22 1332  Status: Signed          Editor: Jorje Guild, MD (Physician)               Mason Fields is a 88 yo M with h/o a-fib, CHF, hypothyroidism and recent admission with right side rib fractures  after fall.  He was discharged yesterday and today the home health nurse checked on him and was concerned by his BP and HR.  She recorded BP 80/46 and irregular HR ranging from 50-120s.  His is prescribed amiodarone but has not taken his doses today.                  Past Medical History:        Diagnosis  Date         ?  A-fib (HCC)       ?  CHF (congestive heart failure) (HCC)       ?  Hypercholesteremia           ?  Hypothyroid               Past Surgical History:         Procedure  Laterality  Date          ?  HX CORONARY STENT PLACEMENT              10 years ago             No family history on file.        Social History          Socioeconomic History         ?  Marital status:  MARRIED              Spouse name:  Not on file         ?  Number of children:  Not on file     ?  Years of education:  Not on file     ?  Highest education level:  Not on file       Occupational History        ?  Not on file       Tobacco Use         ?  Smoking status:  Never              Passive exposure:  Never         ?  Smokeless tobacco:  Never       Vaping Use         ?  Vaping Use:  Never used       Substance and Sexual Activity         ?  Alcohol use:  Not on file     ?  Drug use:  Never     ?  Sexual activity:  Not Currently        Other Topics  Concern        ?  Not on file       Social History Narrative        ?  Not on file  Social Determinants of Health          Financial Resource Strain: Not on file     Food Insecurity: Not on file     Transportation Needs: Not on file     Physical Activity: Not on file      Stress: Not on file     Social Connections: Not on file     Intimate Partner Violence: Not on file       Housing Stability: Not on file              ALLERGIES: Patient has no known allergies.      Review of Systems    Neurological:  Positive for light-headedness.         Vitals:             01/13/22 1621  01/13/22 1748  01/13/22 1754  01/13/22 1756           BP:  (!) 107/51  (!) 159/62  (!) 146/89  (!) 125/52     Pulse:  81  75  76  82     Resp:  15  16  19  24      Temp:             SpO2:  98%  100%  99%  99%     Weight:                   Height:                        Physical Exam   Vitals and nursing note reviewed.    Constitutional:        General: He is not in acute distress.      Appearance: He is well-developed.    HENT:       Head: Normocephalic and atraumatic.       Mouth/Throat:       Mouth: Mucous membranes are moist.    Eyes:       Extraocular Movements: Extraocular movements intact.       Conjunctiva/sclera: Conjunctivae normal.    Neck:       Trachea: Phonation normal.    Cardiovascular:       Rate and Rhythm: Normal rate. Rhythm irregular.    Pulmonary:       Effort: Pulmonary effort is normal. No respiratory distress.     Abdominal:       General: There is no distension.       Tenderness: There is no abdominal tenderness. There is no guarding.     Musculoskeletal:          General: No tenderness. Normal range of motion.       Cervical back: Normal range of motion.    Skin:      General: Skin is warm and dry.    Neurological:       General: No focal deficit present.       Mental Status: He is alert. He is not disoriented.       Motor: No abnormal muscle tone.        ED EKG interpretation:   Rhythm: atrial fib; and irregular. Rate (approx.): 77; Axis: left axis deviation; P wave: afib; QRS interval: normal ; ST/T wave: non-specific changes; Other findings: abnormal ekg.  This EKG was interpreted by , MD,ED Provider.       Medical Decision Making   Amount  and/or Complexity  of Data  Reviewed   Labs: ordered.   Radiology: ordered.   ECG/medicine tests: ordered.      Risk   Prescription drug management.             4:14 PM   PAtient reassessed and states that he feels fine.  BPs have been much improved since triage without receiving fluid bolus.  Will check orthostatics and if stable ambulate patient. Goal to discharge  home.        6:24 PM   Repeat orthostatics improved with .  Stable with ambulation.  Patient discharged home.          Procedures

## 2022-01-14 LAB — EKG, 12 LEAD, INITIAL
Calculated R Axis: -57 degrees
Calculated T Axis: 140 degrees
Q-T Interval: 388 ms
QRS Duration: 88 ms
QTC Calculation (Bezet): 439 ms
Ventricular Rate: 77 {beats}/min

## 2022-01-14 LAB — EKG 12-LEAD
Q-T Interval: 388 ms
QRS Duration: 88 ms
QTc Calculation (Bazett): 439 ms
R Axis: -57 degrees
T Axis: 140 degrees
Ventricular Rate: 77 {beats}/min

## 2022-01-16 ENCOUNTER — Ambulatory Visit: Payer: MEDICARE | Attending: Specialist | Primary: Family Medicine

## 2022-01-16 DIAGNOSIS — S32039D Unspecified fracture of third lumbar vertebra, subsequent encounter for fracture with routine healing: Secondary | ICD-10-CM | POA: Diagnosis not present

## 2022-01-16 DIAGNOSIS — Z9181 History of falling: Secondary | ICD-10-CM | POA: Diagnosis not present

## 2022-01-16 DIAGNOSIS — S32019D Unspecified fracture of first lumbar vertebra, subsequent encounter for fracture with routine healing: Secondary | ICD-10-CM | POA: Diagnosis not present

## 2022-01-16 DIAGNOSIS — I509 Heart failure, unspecified: Secondary | ICD-10-CM | POA: Diagnosis not present

## 2022-01-16 DIAGNOSIS — I4891 Unspecified atrial fibrillation: Secondary | ICD-10-CM | POA: Diagnosis not present

## 2022-01-16 DIAGNOSIS — N179 Acute kidney failure, unspecified: Secondary | ICD-10-CM | POA: Diagnosis not present

## 2022-01-16 DIAGNOSIS — E7800 Pure hypercholesterolemia, unspecified: Secondary | ICD-10-CM | POA: Diagnosis not present

## 2022-01-16 DIAGNOSIS — W010XXD Fall on same level from slipping, tripping and stumbling without subsequent striking against object, subsequent encounter: Secondary | ICD-10-CM | POA: Diagnosis not present

## 2022-01-16 DIAGNOSIS — M48061 Spinal stenosis, lumbar region without neurogenic claudication: Secondary | ICD-10-CM | POA: Diagnosis not present

## 2022-01-16 DIAGNOSIS — Z955 Presence of coronary angioplasty implant and graft: Secondary | ICD-10-CM | POA: Diagnosis not present

## 2022-01-16 DIAGNOSIS — I110 Hypertensive heart disease with heart failure: Secondary | ICD-10-CM | POA: Diagnosis not present

## 2022-01-16 DIAGNOSIS — S2241XD Multiple fractures of ribs, right side, subsequent encounter for fracture with routine healing: Secondary | ICD-10-CM | POA: Diagnosis not present

## 2022-01-16 DIAGNOSIS — J90 Pleural effusion, not elsewhere classified: Secondary | ICD-10-CM | POA: Diagnosis not present

## 2022-01-16 DIAGNOSIS — E039 Hypothyroidism, unspecified: Secondary | ICD-10-CM | POA: Diagnosis not present

## 2022-01-18 DIAGNOSIS — N179 Acute kidney failure, unspecified: Secondary | ICD-10-CM | POA: Diagnosis not present

## 2022-01-18 DIAGNOSIS — M48061 Spinal stenosis, lumbar region without neurogenic claudication: Secondary | ICD-10-CM | POA: Diagnosis not present

## 2022-01-18 DIAGNOSIS — I110 Hypertensive heart disease with heart failure: Secondary | ICD-10-CM | POA: Diagnosis not present

## 2022-01-18 DIAGNOSIS — S2241XD Multiple fractures of ribs, right side, subsequent encounter for fracture with routine healing: Secondary | ICD-10-CM | POA: Diagnosis not present

## 2022-01-18 DIAGNOSIS — W010XXD Fall on same level from slipping, tripping and stumbling without subsequent striking against object, subsequent encounter: Secondary | ICD-10-CM | POA: Diagnosis not present

## 2022-01-18 DIAGNOSIS — J90 Pleural effusion, not elsewhere classified: Secondary | ICD-10-CM | POA: Diagnosis not present

## 2022-01-19 DIAGNOSIS — E7800 Pure hypercholesterolemia, unspecified: Secondary | ICD-10-CM | POA: Diagnosis not present

## 2022-01-19 DIAGNOSIS — I480 Paroxysmal atrial fibrillation: Secondary | ICD-10-CM | POA: Diagnosis not present

## 2022-01-19 DIAGNOSIS — I10 Essential (primary) hypertension: Secondary | ICD-10-CM | POA: Diagnosis not present

## 2022-01-19 LAB — CULTURE, BLOOD: Culture result:: NO GROWTH

## 2022-01-19 LAB — CULTURE, BLOOD 1: Culture: NO GROWTH

## 2022-01-23 DIAGNOSIS — S2241XD Multiple fractures of ribs, right side, subsequent encounter for fracture with routine healing: Secondary | ICD-10-CM | POA: Diagnosis not present

## 2022-01-23 DIAGNOSIS — J90 Pleural effusion, not elsewhere classified: Secondary | ICD-10-CM | POA: Diagnosis not present

## 2022-01-23 DIAGNOSIS — W010XXD Fall on same level from slipping, tripping and stumbling without subsequent striking against object, subsequent encounter: Secondary | ICD-10-CM | POA: Diagnosis not present

## 2022-01-23 DIAGNOSIS — N179 Acute kidney failure, unspecified: Secondary | ICD-10-CM | POA: Diagnosis not present

## 2022-01-23 DIAGNOSIS — M48061 Spinal stenosis, lumbar region without neurogenic claudication: Secondary | ICD-10-CM | POA: Diagnosis not present

## 2022-01-23 DIAGNOSIS — I110 Hypertensive heart disease with heart failure: Secondary | ICD-10-CM | POA: Diagnosis not present

## 2022-01-25 DIAGNOSIS — N179 Acute kidney failure, unspecified: Secondary | ICD-10-CM | POA: Diagnosis not present

## 2022-01-25 DIAGNOSIS — J90 Pleural effusion, not elsewhere classified: Secondary | ICD-10-CM | POA: Diagnosis not present

## 2022-01-25 DIAGNOSIS — S2241XD Multiple fractures of ribs, right side, subsequent encounter for fracture with routine healing: Secondary | ICD-10-CM | POA: Diagnosis not present

## 2022-01-25 DIAGNOSIS — I110 Hypertensive heart disease with heart failure: Secondary | ICD-10-CM | POA: Diagnosis not present

## 2022-01-25 DIAGNOSIS — M48061 Spinal stenosis, lumbar region without neurogenic claudication: Secondary | ICD-10-CM | POA: Diagnosis not present

## 2022-01-25 DIAGNOSIS — W010XXD Fall on same level from slipping, tripping and stumbling without subsequent striking against object, subsequent encounter: Secondary | ICD-10-CM | POA: Diagnosis not present

## 2022-01-29 DIAGNOSIS — I480 Paroxysmal atrial fibrillation: Secondary | ICD-10-CM | POA: Diagnosis not present

## 2022-01-29 DIAGNOSIS — N179 Acute kidney failure, unspecified: Secondary | ICD-10-CM | POA: Diagnosis not present

## 2022-01-29 DIAGNOSIS — M48061 Spinal stenosis, lumbar region without neurogenic claudication: Secondary | ICD-10-CM | POA: Diagnosis not present

## 2022-01-29 DIAGNOSIS — I110 Hypertensive heart disease with heart failure: Secondary | ICD-10-CM | POA: Diagnosis not present

## 2022-01-29 DIAGNOSIS — S2241XD Multiple fractures of ribs, right side, subsequent encounter for fracture with routine healing: Secondary | ICD-10-CM | POA: Diagnosis not present

## 2022-01-29 DIAGNOSIS — J90 Pleural effusion, not elsewhere classified: Secondary | ICD-10-CM | POA: Diagnosis not present

## 2022-01-29 DIAGNOSIS — W010XXD Fall on same level from slipping, tripping and stumbling without subsequent striking against object, subsequent encounter: Secondary | ICD-10-CM | POA: Diagnosis not present

## 2022-01-30 DIAGNOSIS — N179 Acute kidney failure, unspecified: Secondary | ICD-10-CM | POA: Diagnosis not present

## 2022-01-30 DIAGNOSIS — M48061 Spinal stenosis, lumbar region without neurogenic claudication: Secondary | ICD-10-CM | POA: Diagnosis not present

## 2022-01-30 DIAGNOSIS — J90 Pleural effusion, not elsewhere classified: Secondary | ICD-10-CM | POA: Diagnosis not present

## 2022-01-30 DIAGNOSIS — S2241XD Multiple fractures of ribs, right side, subsequent encounter for fracture with routine healing: Secondary | ICD-10-CM | POA: Diagnosis not present

## 2022-01-30 DIAGNOSIS — W010XXD Fall on same level from slipping, tripping and stumbling without subsequent striking against object, subsequent encounter: Secondary | ICD-10-CM | POA: Diagnosis not present

## 2022-01-30 DIAGNOSIS — I110 Hypertensive heart disease with heart failure: Secondary | ICD-10-CM | POA: Diagnosis not present

## 2022-01-31 DIAGNOSIS — I480 Paroxysmal atrial fibrillation: Secondary | ICD-10-CM | POA: Diagnosis not present

## 2022-02-01 DIAGNOSIS — M48061 Spinal stenosis, lumbar region without neurogenic claudication: Secondary | ICD-10-CM | POA: Diagnosis not present

## 2022-02-01 DIAGNOSIS — J90 Pleural effusion, not elsewhere classified: Secondary | ICD-10-CM | POA: Diagnosis not present

## 2022-02-01 DIAGNOSIS — S2241XD Multiple fractures of ribs, right side, subsequent encounter for fracture with routine healing: Secondary | ICD-10-CM | POA: Diagnosis not present

## 2022-02-01 DIAGNOSIS — W010XXD Fall on same level from slipping, tripping and stumbling without subsequent striking against object, subsequent encounter: Secondary | ICD-10-CM | POA: Diagnosis not present

## 2022-02-01 DIAGNOSIS — I110 Hypertensive heart disease with heart failure: Secondary | ICD-10-CM | POA: Diagnosis not present

## 2022-02-01 DIAGNOSIS — N179 Acute kidney failure, unspecified: Secondary | ICD-10-CM | POA: Diagnosis not present

## 2022-02-06 DIAGNOSIS — N179 Acute kidney failure, unspecified: Secondary | ICD-10-CM | POA: Diagnosis not present

## 2022-02-06 DIAGNOSIS — W010XXD Fall on same level from slipping, tripping and stumbling without subsequent striking against object, subsequent encounter: Secondary | ICD-10-CM | POA: Diagnosis not present

## 2022-02-06 DIAGNOSIS — S2241XD Multiple fractures of ribs, right side, subsequent encounter for fracture with routine healing: Secondary | ICD-10-CM | POA: Diagnosis not present

## 2022-02-06 DIAGNOSIS — J90 Pleural effusion, not elsewhere classified: Secondary | ICD-10-CM | POA: Diagnosis not present

## 2022-02-06 DIAGNOSIS — I110 Hypertensive heart disease with heart failure: Secondary | ICD-10-CM | POA: Diagnosis not present

## 2022-02-06 DIAGNOSIS — M48061 Spinal stenosis, lumbar region without neurogenic claudication: Secondary | ICD-10-CM | POA: Diagnosis not present

## 2022-02-07 DIAGNOSIS — Z9229 Personal history of other drug therapy: Secondary | ICD-10-CM | POA: Diagnosis not present

## 2022-02-07 DIAGNOSIS — S32030D Wedge compression fracture of third lumbar vertebra, subsequent encounter for fracture with routine healing: Secondary | ICD-10-CM | POA: Diagnosis not present

## 2022-02-07 DIAGNOSIS — I110 Hypertensive heart disease with heart failure: Secondary | ICD-10-CM | POA: Diagnosis not present

## 2022-02-07 DIAGNOSIS — R7989 Other specified abnormal findings of blood chemistry: Secondary | ICD-10-CM | POA: Diagnosis not present

## 2022-02-07 DIAGNOSIS — N179 Acute kidney failure, unspecified: Secondary | ICD-10-CM | POA: Diagnosis not present

## 2022-02-07 DIAGNOSIS — W19XXXD Unspecified fall, subsequent encounter: Secondary | ICD-10-CM | POA: Diagnosis not present

## 2022-02-07 DIAGNOSIS — S2241XD Multiple fractures of ribs, right side, subsequent encounter for fracture with routine healing: Secondary | ICD-10-CM | POA: Diagnosis not present

## 2022-02-07 DIAGNOSIS — I4811 Longstanding persistent atrial fibrillation: Secondary | ICD-10-CM | POA: Diagnosis not present

## 2022-02-07 DIAGNOSIS — W010XXD Fall on same level from slipping, tripping and stumbling without subsequent striking against object, subsequent encounter: Secondary | ICD-10-CM | POA: Diagnosis not present

## 2022-02-07 DIAGNOSIS — J90 Pleural effusion, not elsewhere classified: Secondary | ICD-10-CM | POA: Diagnosis not present

## 2022-02-07 DIAGNOSIS — M48061 Spinal stenosis, lumbar region without neurogenic claudication: Secondary | ICD-10-CM | POA: Diagnosis not present

## 2022-02-08 DIAGNOSIS — I110 Hypertensive heart disease with heart failure: Secondary | ICD-10-CM | POA: Diagnosis not present

## 2022-02-08 DIAGNOSIS — W010XXD Fall on same level from slipping, tripping and stumbling without subsequent striking against object, subsequent encounter: Secondary | ICD-10-CM | POA: Diagnosis not present

## 2022-02-08 DIAGNOSIS — J90 Pleural effusion, not elsewhere classified: Secondary | ICD-10-CM | POA: Diagnosis not present

## 2022-02-08 DIAGNOSIS — S2241XD Multiple fractures of ribs, right side, subsequent encounter for fracture with routine healing: Secondary | ICD-10-CM | POA: Diagnosis not present

## 2022-02-08 DIAGNOSIS — N179 Acute kidney failure, unspecified: Secondary | ICD-10-CM | POA: Diagnosis not present

## 2022-02-08 DIAGNOSIS — M48061 Spinal stenosis, lumbar region without neurogenic claudication: Secondary | ICD-10-CM | POA: Diagnosis not present

## 2022-02-13 DIAGNOSIS — I4811 Longstanding persistent atrial fibrillation: Secondary | ICD-10-CM | POA: Diagnosis not present

## 2022-02-13 DIAGNOSIS — Z9229 Personal history of other drug therapy: Secondary | ICD-10-CM | POA: Diagnosis not present

## 2022-02-14 DIAGNOSIS — G250 Essential tremor: Secondary | ICD-10-CM | POA: Diagnosis not present

## 2022-02-14 DIAGNOSIS — M48061 Spinal stenosis, lumbar region without neurogenic claudication: Secondary | ICD-10-CM | POA: Diagnosis not present

## 2022-02-14 DIAGNOSIS — N179 Acute kidney failure, unspecified: Secondary | ICD-10-CM | POA: Diagnosis not present

## 2022-02-14 DIAGNOSIS — I4811 Longstanding persistent atrial fibrillation: Secondary | ICD-10-CM | POA: Diagnosis not present

## 2022-02-14 DIAGNOSIS — W010XXD Fall on same level from slipping, tripping and stumbling without subsequent striking against object, subsequent encounter: Secondary | ICD-10-CM | POA: Diagnosis not present

## 2022-02-14 DIAGNOSIS — N189 Chronic kidney disease, unspecified: Secondary | ICD-10-CM | POA: Diagnosis not present

## 2022-02-14 DIAGNOSIS — Z9229 Personal history of other drug therapy: Secondary | ICD-10-CM | POA: Diagnosis not present

## 2022-02-14 DIAGNOSIS — R600 Localized edema: Secondary | ICD-10-CM | POA: Diagnosis not present

## 2022-02-14 DIAGNOSIS — I509 Heart failure, unspecified: Secondary | ICD-10-CM | POA: Diagnosis not present

## 2022-02-14 DIAGNOSIS — J90 Pleural effusion, not elsewhere classified: Secondary | ICD-10-CM | POA: Diagnosis not present

## 2022-02-14 DIAGNOSIS — S2241XD Multiple fractures of ribs, right side, subsequent encounter for fracture with routine healing: Secondary | ICD-10-CM | POA: Diagnosis not present

## 2022-02-14 DIAGNOSIS — I110 Hypertensive heart disease with heart failure: Secondary | ICD-10-CM | POA: Diagnosis not present

## 2022-02-15 DIAGNOSIS — S32039D Unspecified fracture of third lumbar vertebra, subsequent encounter for fracture with routine healing: Secondary | ICD-10-CM | POA: Diagnosis not present

## 2022-02-15 DIAGNOSIS — S2241XD Multiple fractures of ribs, right side, subsequent encounter for fracture with routine healing: Secondary | ICD-10-CM | POA: Diagnosis not present

## 2022-02-15 DIAGNOSIS — N179 Acute kidney failure, unspecified: Secondary | ICD-10-CM | POA: Diagnosis not present

## 2022-02-15 DIAGNOSIS — E7800 Pure hypercholesterolemia, unspecified: Secondary | ICD-10-CM | POA: Diagnosis not present

## 2022-02-15 DIAGNOSIS — E039 Hypothyroidism, unspecified: Secondary | ICD-10-CM | POA: Diagnosis not present

## 2022-02-15 DIAGNOSIS — I110 Hypertensive heart disease with heart failure: Secondary | ICD-10-CM | POA: Diagnosis not present

## 2022-02-15 DIAGNOSIS — I4891 Unspecified atrial fibrillation: Secondary | ICD-10-CM | POA: Diagnosis not present

## 2022-02-15 DIAGNOSIS — J90 Pleural effusion, not elsewhere classified: Secondary | ICD-10-CM | POA: Diagnosis not present

## 2022-02-15 DIAGNOSIS — S32019D Unspecified fracture of first lumbar vertebra, subsequent encounter for fracture with routine healing: Secondary | ICD-10-CM | POA: Diagnosis not present

## 2022-02-15 DIAGNOSIS — Z9181 History of falling: Secondary | ICD-10-CM | POA: Diagnosis not present

## 2022-02-15 DIAGNOSIS — Z955 Presence of coronary angioplasty implant and graft: Secondary | ICD-10-CM | POA: Diagnosis not present

## 2022-02-15 DIAGNOSIS — M48061 Spinal stenosis, lumbar region without neurogenic claudication: Secondary | ICD-10-CM | POA: Diagnosis not present

## 2022-02-15 DIAGNOSIS — I509 Heart failure, unspecified: Secondary | ICD-10-CM | POA: Diagnosis not present

## 2022-02-15 DIAGNOSIS — W010XXD Fall on same level from slipping, tripping and stumbling without subsequent striking against object, subsequent encounter: Secondary | ICD-10-CM | POA: Diagnosis not present

## 2022-02-16 DIAGNOSIS — I110 Hypertensive heart disease with heart failure: Secondary | ICD-10-CM | POA: Diagnosis not present

## 2022-02-16 DIAGNOSIS — W010XXD Fall on same level from slipping, tripping and stumbling without subsequent striking against object, subsequent encounter: Secondary | ICD-10-CM | POA: Diagnosis not present

## 2022-02-16 DIAGNOSIS — S2241XD Multiple fractures of ribs, right side, subsequent encounter for fracture with routine healing: Secondary | ICD-10-CM | POA: Diagnosis not present

## 2022-02-16 DIAGNOSIS — N179 Acute kidney failure, unspecified: Secondary | ICD-10-CM | POA: Diagnosis not present

## 2022-02-16 DIAGNOSIS — J90 Pleural effusion, not elsewhere classified: Secondary | ICD-10-CM | POA: Diagnosis not present

## 2022-02-16 DIAGNOSIS — M48061 Spinal stenosis, lumbar region without neurogenic claudication: Secondary | ICD-10-CM | POA: Diagnosis not present

## 2022-02-20 DIAGNOSIS — W010XXD Fall on same level from slipping, tripping and stumbling without subsequent striking against object, subsequent encounter: Secondary | ICD-10-CM | POA: Diagnosis not present

## 2022-02-20 DIAGNOSIS — S2241XD Multiple fractures of ribs, right side, subsequent encounter for fracture with routine healing: Secondary | ICD-10-CM | POA: Diagnosis not present

## 2022-02-20 DIAGNOSIS — N179 Acute kidney failure, unspecified: Secondary | ICD-10-CM | POA: Diagnosis not present

## 2022-02-20 DIAGNOSIS — J90 Pleural effusion, not elsewhere classified: Secondary | ICD-10-CM | POA: Diagnosis not present

## 2022-02-20 DIAGNOSIS — I110 Hypertensive heart disease with heart failure: Secondary | ICD-10-CM | POA: Diagnosis not present

## 2022-02-20 DIAGNOSIS — M48061 Spinal stenosis, lumbar region without neurogenic claudication: Secondary | ICD-10-CM | POA: Diagnosis not present

## 2022-02-21 DIAGNOSIS — M48061 Spinal stenosis, lumbar region without neurogenic claudication: Secondary | ICD-10-CM | POA: Diagnosis not present

## 2022-02-21 DIAGNOSIS — I110 Hypertensive heart disease with heart failure: Secondary | ICD-10-CM | POA: Diagnosis not present

## 2022-02-21 DIAGNOSIS — J90 Pleural effusion, not elsewhere classified: Secondary | ICD-10-CM | POA: Diagnosis not present

## 2022-02-21 DIAGNOSIS — W010XXD Fall on same level from slipping, tripping and stumbling without subsequent striking against object, subsequent encounter: Secondary | ICD-10-CM | POA: Diagnosis not present

## 2022-02-21 DIAGNOSIS — S2241XD Multiple fractures of ribs, right side, subsequent encounter for fracture with routine healing: Secondary | ICD-10-CM | POA: Diagnosis not present

## 2022-02-21 DIAGNOSIS — N179 Acute kidney failure, unspecified: Secondary | ICD-10-CM | POA: Diagnosis not present

## 2022-02-22 DIAGNOSIS — I509 Heart failure, unspecified: Secondary | ICD-10-CM | POA: Diagnosis not present

## 2022-02-22 DIAGNOSIS — I4811 Longstanding persistent atrial fibrillation: Secondary | ICD-10-CM | POA: Diagnosis not present

## 2022-02-22 DIAGNOSIS — I480 Paroxysmal atrial fibrillation: Secondary | ICD-10-CM | POA: Diagnosis not present

## 2022-02-22 DIAGNOSIS — Z9229 Personal history of other drug therapy: Secondary | ICD-10-CM | POA: Diagnosis not present

## 2022-02-23 DIAGNOSIS — I509 Heart failure, unspecified: Secondary | ICD-10-CM | POA: Diagnosis not present

## 2022-02-23 DIAGNOSIS — I4811 Longstanding persistent atrial fibrillation: Secondary | ICD-10-CM | POA: Diagnosis not present

## 2022-02-23 DIAGNOSIS — Z9229 Personal history of other drug therapy: Secondary | ICD-10-CM | POA: Diagnosis not present

## 2022-02-23 DIAGNOSIS — I480 Paroxysmal atrial fibrillation: Secondary | ICD-10-CM | POA: Diagnosis not present

## 2022-02-25 ENCOUNTER — Inpatient Hospital Stay
Admission: EM | Admit: 2022-02-25 | Discharge: 2022-02-26 | Disposition: A | Discharge status: Home / Self Care | Payer: Medicare Other | Admitting: Hospitalist

## 2022-02-25 DIAGNOSIS — R778 Other specified abnormalities of plasma proteins: Principal | ICD-10-CM

## 2022-02-25 DIAGNOSIS — G4700 Insomnia, unspecified: Secondary | ICD-10-CM

## 2022-02-25 DIAGNOSIS — I4819 Other persistent atrial fibrillation: Secondary | ICD-10-CM | POA: Diagnosis present

## 2022-02-25 DIAGNOSIS — E7800 Pure hypercholesterolemia, unspecified: Secondary | ICD-10-CM | POA: Diagnosis present

## 2022-02-25 DIAGNOSIS — J9811 Atelectasis: Secondary | ICD-10-CM | POA: Diagnosis present

## 2022-02-25 DIAGNOSIS — R9431 Abnormal electrocardiogram [ECG] [EKG]: Secondary | ICD-10-CM | POA: Diagnosis not present

## 2022-02-25 DIAGNOSIS — I350 Nonrheumatic aortic (valve) stenosis: Secondary | ICD-10-CM | POA: Diagnosis present

## 2022-02-25 DIAGNOSIS — I5031 Acute diastolic (congestive) heart failure: Secondary | ICD-10-CM | POA: Diagnosis not present

## 2022-02-25 DIAGNOSIS — I5082 Biventricular heart failure: Secondary | ICD-10-CM | POA: Diagnosis present

## 2022-02-25 DIAGNOSIS — R5383 Other fatigue: Secondary | ICD-10-CM | POA: Diagnosis not present

## 2022-02-25 DIAGNOSIS — I083 Combined rheumatic disorders of mitral, aortic and tricuspid valves: Secondary | ICD-10-CM | POA: Diagnosis not present

## 2022-02-25 DIAGNOSIS — E8809 Other disorders of plasma-protein metabolism, not elsewhere classified: Secondary | ICD-10-CM | POA: Diagnosis present

## 2022-02-25 DIAGNOSIS — Z955 Presence of coronary angioplasty implant and graft: Secondary | ICD-10-CM | POA: Diagnosis not present

## 2022-02-25 DIAGNOSIS — R5381 Other malaise: Secondary | ICD-10-CM | POA: Diagnosis not present

## 2022-02-25 DIAGNOSIS — R799 Abnormal finding of blood chemistry, unspecified: Secondary | ICD-10-CM | POA: Diagnosis not present

## 2022-02-25 DIAGNOSIS — I4891 Unspecified atrial fibrillation: Secondary | ICD-10-CM | POA: Diagnosis not present

## 2022-02-25 DIAGNOSIS — I878 Other specified disorders of veins: Secondary | ICD-10-CM | POA: Diagnosis not present

## 2022-02-25 DIAGNOSIS — Z79899 Other long term (current) drug therapy: Secondary | ICD-10-CM | POA: Diagnosis not present

## 2022-02-25 DIAGNOSIS — G629 Polyneuropathy, unspecified: Secondary | ICD-10-CM | POA: Diagnosis not present

## 2022-02-25 DIAGNOSIS — E039 Hypothyroidism, unspecified: Secondary | ICD-10-CM | POA: Diagnosis not present

## 2022-02-25 DIAGNOSIS — R791 Abnormal coagulation profile: Secondary | ICD-10-CM | POA: Diagnosis present

## 2022-02-25 DIAGNOSIS — D649 Anemia, unspecified: Secondary | ICD-10-CM | POA: Diagnosis not present

## 2022-02-25 DIAGNOSIS — R296 Repeated falls: Secondary | ICD-10-CM | POA: Diagnosis present

## 2022-02-25 DIAGNOSIS — S2241XA Multiple fractures of ribs, right side, initial encounter for closed fracture: Secondary | ICD-10-CM | POA: Diagnosis present

## 2022-02-25 DIAGNOSIS — N183 Chronic kidney disease, stage 3 unspecified: Secondary | ICD-10-CM | POA: Diagnosis not present

## 2022-02-25 DIAGNOSIS — I5020 Unspecified systolic (congestive) heart failure: Secondary | ICD-10-CM | POA: Diagnosis not present

## 2022-02-25 DIAGNOSIS — Z7901 Long term (current) use of anticoagulants: Secondary | ICD-10-CM | POA: Diagnosis not present

## 2022-02-25 LAB — COMPREHENSIVE METABOLIC PANEL
ALT: 17 U/L (ref 12–78)
AST: 31 U/L (ref 15–37)
Albumin/Globulin Ratio: 0.7 — ABNORMAL LOW (ref 1.1–2.2)
Albumin: 3 g/dL — ABNORMAL LOW (ref 3.5–5.0)
Alk Phosphatase: 118 U/L — ABNORMAL HIGH (ref 45–117)
Anion Gap: 4 mmol/L — ABNORMAL LOW (ref 5–15)
BUN: 23 MG/DL — ABNORMAL HIGH (ref 6–20)
Bun/Cre Ratio: 12 (ref 12–20)
CO2: 29 mmol/L (ref 21–32)
Calcium: 8.2 MG/DL — ABNORMAL LOW (ref 8.5–10.1)
Chloride: 103 mmol/L (ref 97–108)
Creatinine: 1.9 MG/DL — ABNORMAL HIGH (ref 0.70–1.30)
Est, Glom Filt Rate: 33 mL/min/{1.73_m2} — ABNORMAL LOW (ref >60)
Globulin: 4.2 g/dL — ABNORMAL HIGH (ref 2.0–4.0)
Glucose: 141 mg/dL — ABNORMAL HIGH (ref 65–100)
Potassium: 3.2 mmol/L — ABNORMAL LOW (ref 3.5–5.1)
Sodium: 136 mmol/L (ref 136–145)
Total Bilirubin: 1.2 MG/DL — ABNORMAL HIGH (ref 0.2–1.0)
Total Protein: 7.2 g/dL (ref 6.4–8.2)

## 2022-02-25 LAB — CBC WITH AUTO DIFFERENTIAL
Absolute Immature Granulocyte: 0 10*3/uL (ref 0.00–0.04)
Basophils %: 1 % (ref 0–1)
Basophils Absolute: 0.1 10*3/uL (ref 0.0–0.1)
Eosinophils %: 1 % (ref 0–7)
Eosinophils Absolute: 0.1 10*3/uL (ref 0.0–0.4)
Hematocrit: 35 % — ABNORMAL LOW (ref 36.6–50.3)
Hemoglobin: 11.4 g/dL — ABNORMAL LOW (ref 12.1–17.0)
Immature Granulocytes: 0 % (ref 0.0–0.5)
Lymphocytes %: 21 % (ref 12–49)
Lymphocytes Absolute: 1 10*3/uL (ref 0.8–3.5)
MCH: 29.9 PG (ref 26.0–34.0)
MCHC: 32.6 g/dL (ref 30.0–36.5)
MCV: 91.9 FL (ref 80.0–99.0)
MPV: 9.9 FL (ref 8.9–12.9)
Monocytes %: 12 % (ref 5–13)
Monocytes Absolute: 0.6 10*3/uL (ref 0.0–1.0)
Neutrophils %: 65 % (ref 32–75)
Neutrophils Absolute: 3.1 10*3/uL (ref 1.8–8.0)
Nucleated RBCs: 0 PER 100 WBC
Platelets: 129 10*3/uL — ABNORMAL LOW (ref 150–400)
RBC: 3.81 M/uL — ABNORMAL LOW (ref 4.10–5.70)
RDW: 18.2 % — ABNORMAL HIGH (ref 11.5–14.5)
WBC: 4.8 10*3/uL (ref 4.1–11.1)
nRBC: 0 10*3/uL (ref 0.00–0.01)

## 2022-02-25 LAB — MAGNESIUM
Magnesium: 1.8 mg/dL (ref 1.6–2.4)
Magnesium: 2 mg/dL (ref 1.6–2.4)

## 2022-02-25 LAB — TROPONIN
Troponin, High Sensitivity: 114 ng/L — ABNORMAL HIGH (ref 0–76)
Troponin, High Sensitivity: 129 ng/L (ref 0–76)

## 2022-02-25 LAB — PROTIME-INR
INR: 2.6 — ABNORMAL HIGH (ref 0.9–1.1)
Protime: 25.5 s — ABNORMAL HIGH (ref 9.0–11.1)

## 2022-02-25 LAB — EXTRA TUBES HOLD

## 2022-02-25 LAB — TSH: TSH, 3RD GENERATION: 9.61 u[IU]/mL — ABNORMAL HIGH (ref 0.36–3.74)

## 2022-02-25 MED ORDER — NITROGLYCERIN 0.4 MG SL SUBL
0.4 MG | SUBLINGUAL | Status: DC | PRN
Start: 2022-02-25 — End: 2022-02-26

## 2022-02-25 MED ORDER — POTASSIUM CHLORIDE 10 MEQ/100ML IV SOLN
10 MEQ/0ML | INTRAVENOUS | Status: AC
Start: 2022-02-25 — End: 2022-02-26
  Administered 2022-02-26 (×4): 10 meq via INTRAVENOUS

## 2022-02-25 MED ORDER — WARFARIN SODIUM 5 MG PO TABS
5 MG | Freq: Once | ORAL | Status: AC
Start: 2022-02-25 — End: 2022-02-25
  Administered 2022-02-26: 01:00:00 5 mg via ORAL

## 2022-02-25 MED ORDER — PROCHLORPERAZINE EDISYLATE 10 MG/2ML IJ SOLN
10 MG/2ML | Freq: Four times a day (QID) | INTRAMUSCULAR | Status: DC | PRN
Start: 2022-02-25 — End: 2022-02-26

## 2022-02-25 MED ORDER — POTASSIUM CHLORIDE ER 10 MEQ PO TBCR
10 MEQ | Freq: Every day | ORAL | Status: DC
Start: 2022-02-25 — End: 2022-02-26
  Administered 2022-02-26: 14:00:00 40 meq via ORAL

## 2022-02-25 MED ORDER — PROMETHAZINE HCL 25 MG/ML IJ SOLN
25 MG/ML | Freq: Four times a day (QID) | INTRAMUSCULAR | Status: DC | PRN
Start: 2022-02-25 — End: 2022-02-25

## 2022-02-25 MED ORDER — ACETAMINOPHEN 325 MG PO TABS
325 MG | ORAL | Status: DC | PRN
Start: 2022-02-25 — End: 2022-02-26

## 2022-02-25 MED ORDER — POLYETHYLENE GLYCOL 3350 17 G PO PACK
17 g | Freq: Two times a day (BID) | ORAL | Status: DC
Start: 2022-02-25 — End: 2022-02-26
  Administered 2022-02-26: 14:00:00 17 g via ORAL

## 2022-02-25 MED ORDER — PANTOPRAZOLE SODIUM 40 MG IV SOLR
40 MG | Freq: Every day | INTRAVENOUS | Status: DC
Start: 2022-02-25 — End: 2022-02-26
  Administered 2022-02-26 (×2): 40 mg via INTRAVENOUS

## 2022-02-25 MED ORDER — WARFARIN DAILY DOSING BY PHARMACIST (PLACEHOLDER)
Status: DC
Start: 2022-02-25 — End: 2022-02-26

## 2022-02-25 MED FILL — WARFARIN SODIUM 5 MG PO TABS: 5 MG | ORAL | Qty: 1

## 2022-02-25 NOTE — Other (Incomplete)
Repeat troponin 139. Patient remains w/o chest pain. Will continue to trend. Nursing to continue to monitor.

## 2022-02-25 NOTE — ED Notes (Signed)
Bedside and Verbal shift change report given to [employer]) by Hollie Beach (offgoing nurse). Report included the following information Nurse Handoff Report, Index, ED SBAR, MAR, and Recent Results.       Lorayne Bender, RN  02/25/22 1939

## 2022-02-25 NOTE — ED Triage Notes (Signed)
Pt to ED c/o insomnia and generalized weakness x 2 months after being discharged from hospital. Denies any other conditions, states "I just can't sleep and that's the problem."

## 2022-02-25 NOTE — ED Provider Notes (Signed)
[facility] EMERGENCY DEPT  EMERGENCY DEPARTMENT ENCOUNTER      Pt Name: Mason Fields  MRN: [id number]  Mason Fields 1929/07/26  Date of evaluation: 02/25/2022  Provider: Dennison Mascot, MD    CHIEF COMPLAINT       Chief Complaint   Patient presents with    Insomnia         HISTORY OF PRESENT ILLNESS    88 y.o. male presents with fatigue and inability to sleep. He tells me this is an ongoing issue for him since he last left the hospital and feels generally unwell but has difficulty describing his symptoms but feels unsteady on his feet today compared to normal. No chest pain, no shortness of breath, no n/v/d.           Review of External Medical Records:     Nursing Notes were reviewed.    REVIEW OF SYSTEMS       Review of Systems    Except as noted above the remainder of the review of systems was reviewed and negative.       PAST MEDICAL HISTORY     Past Medical History:   Diagnosis Date    A-fib Mason Fields)     CHF (congestive heart failure) (Mason Fields)     Hypercholesteremia     Hypothyroid          SURGICAL HISTORY       Past Surgical History:   Procedure Laterality Date    CORONARY ANGIOPLASTY WITH STENT PLACEMENT      10 years ago         CURRENT MEDICATIONS       Previous Medications    No medications on file       ALLERGIES     Patient has no known allergies.    FAMILY HISTORY     No family history on file.       SOCIAL HISTORY       Social History     Socioeconomic History    Marital status: Married   Tobacco Use    Smoking status: Never    Smokeless tobacco: Never   Substance and Sexual Activity    Drug use: Never           PHYSICAL EXAM       ED Triage Vitals   BP Temp Temp Source Pulse Respirations SpO2 Height Weight - Scale   02/25/22 1605 02/25/22 1605 02/25/22 1605 02/25/22 1604 02/25/22 1605 02/25/22 1605 02/25/22 1605 02/25/22 1605   117/74 98 F (36.7 C) Oral 99 16 100 % '5\' 6"'  (1.676 m) 165 lb (74.8 kg)       Body mass index is 26.63 kg/m.    Physical Exam  Vitals and nursing note reviewed.   Constitutional:        Appearance: Normal appearance.   HENT:      Head: Normocephalic and atraumatic.   Cardiovascular:      Rate and Rhythm: Normal rate and regular rhythm.      Heart sounds: Normal heart sounds.   Pulmonary:      Effort: Pulmonary effort is normal.      Breath sounds: Normal breath sounds. No wheezing or rales.   Abdominal:      Palpations: Abdomen is soft.      Tenderness: There is no abdominal tenderness.   Musculoskeletal:         General: Normal range of motion.   Skin:     General: Skin is warm  and dry.   Neurological:      General: No focal deficit present.      Mental Status: He is alert.       DIAGNOSTIC RESULTS     EKG: All EKG's are interpreted by the Emergency Department Physician who either signs or Co-signs this chart in the absence of a cardiologist.        RADIOLOGY:   Plain radiographic images, CT and ultrasound are visualized and preliminarily interpreted by the emergency physician as documented in clinical course.    Interpretation per the Radiologist below, if available at the time of this note:    No orders to display        LABS:  Labs Reviewed   TROPONIN - Abnormal; Notable for the following components:       Result Value    Troponin, High Sensitivity 114 (*)     All other components within normal limits   CBC WITH AUTO DIFFERENTIAL - Abnormal; Notable for the following components:    RBC 3.81 (*)     Hemoglobin 11.4 (*)     Hematocrit 35.0 (*)     RDW 18.2 (*)     Platelets 129 (*)     All other components within normal limits   COMPREHENSIVE METABOLIC PANEL - Abnormal; Notable for the following components:    Potassium 3.2 (*)     Anion Gap 4 (*)     Glucose 141 (*)     BUN 23 (*)     Creatinine 1.90 (*)     Est, Glom Filt Rate 33 (*)     Calcium 8.2 (*)     Total Bilirubin 1.2 (*)     Alk Phosphatase 118 (*)     Albumin 3.0 (*)     Globulin 4.2 (*)     Albumin/Globulin Ratio 0.7 (*)     All other components within normal limits   PROTIME-INR - Abnormal; Notable for the following components:     INR 2.6 (*)     Protime 25.5 (*)     All other components within normal limits   TROPONIN - Abnormal; Notable for the following components:    Troponin, High Sensitivity 129 (*)     All other components within normal limits   EXTRA TUBES HOLD   MAGNESIUM       All other labs were within normal range or not returned as of this dictation.    EMERGENCY DEPARTMENT COURSE and DIFFERENTIAL DIAGNOSIS/MDM:   Vitals:    Vitals:    02/25/22 1604 02/25/22 1605   BP:  117/74   Pulse: 99 91   Resp:  16   Temp:  98 F (36.7 C)   TempSrc:  Oral   SpO2:  100%   Weight:  74.8 kg (165 lb)   Height:  1.676 m ('5\' 6"' )           Medical Decision Making  88 y.o. male presents with chief complaint of insomnia and several other vague symptoms that are difficult for him to describe. Does have cardiac history and is being followed for supratherapeutic INR, holding coumadin currently. His workup is mostly unrevealing except for troponin which is slightly above baseline. Repeat testing is uptrending raising concern for atypical ACS. Will admit for trending and monitoring of this finding.    Problems Addressed:  Insomnia, unspecified type: acute illness or injury  Malaise and fatigue: acute illness or injury    Amount and/or Complexity of Data  Reviewed  Labs: ordered. Decision-making details documented in ED Course.  ECG/medicine tests: ordered and independent interpretation performed. Decision-making details documented in ED Course.    Risk  Decision regarding hospitalization.            REASSESSMENT     ED Course as of 02/25/22 1901   Sun Feb 25, 2022   1554 EKG 12 Lead  ECG at 1546, interpreted by me: Atrial fibrillation, rate 96 bpm.  Normal axis.  Normal intervals.  No appreciated ST elevations.  Low voltage QRS. [SH]      ED Course User Index  [SH] Fenton Foy, DO           CONSULTS:  None    PROCEDURES:  Unless otherwise noted below, none     Procedures      FINAL IMPRESSION      1. Insomnia, unspecified type    2. Malaise and  fatigue          DISPOSITION/PLAN   DISPOSITION Decision To Admit 02/25/2022 06:59:50 PM    [facility] Consult for Admission  7:02 PM    ED Room Number: ER09/09  Patient Name and age:  Mason Fields 88 y.o.  male  Working Diagnosis:   1. Insomnia, unspecified type    2. Malaise and fatigue        COVID-19 Suspicion: No  Sepsis present:  No  Reassessment needed: No  Code Status:  Full Code  Readmission: No  Isolation Requirements: no  Recommended Level of Care: telemetry  Department: [facility] - [phone]  Consulting Provider: Denese Killings    Other: 88 year old male presents with feeling generally unwell for the last couple of days and he has been unable to sleep but is unsure why.  He does have a cardiac history and had been holding his Coumadin in the setting of supratherapeutic INR.  Notably has an uptrending troponin without EKG changes that is unexplained and warrants further investigation.  No chest pain.      PATIENT REFERRED TO:  No follow-up provider specified.    DISCHARGE MEDICATIONS:  New Prescriptions    No medications on file         (Please note that portions of this note were completed with a voice recognition program.  Efforts were made to edit the dictations but occasionally words are mis-transcribed.)    Mason Mascot, MD (electronically signed)  Emergency Attending Physician     Mason Mascot, MD  02/25/22 2242

## 2022-02-25 NOTE — Progress Notes (Signed)
[facility] Dosing Services: warfarin   Consult for Warfarin Dosing by Pharmacy by Dr. Nolon Bussing  Consult provided for this 88 y.o. male , for indication of A fib  Day of Therapy: continue home med (home regimen: 5mg  daily according to wife)  Dose to achieve an INR goal of 2-3  Per wife, pt held his warfarin on Thurs/Fri. His INR was 5.3 this past Fri. His last dose was on 06/10 - 5mg )    H/H/PLTs stable. Will continue 5mg  dose today to continue home regimen. INR daily x7 ordered.     Significant drug interactions: none  PT/INR Lab Results   Component Value Date/Time    INR 2.6 02/25/2022 04:00 PM      Platelets Lab Results   Component Value Date/Time    PLT 129 02/25/2022 04:00 PM      H/H Lab Results   Component Value Date/Time    HGB 11.4 02/25/2022 04:00 PM        Pharmacy to follow daily and will provide subsequent Warfarin dosing based on clinical status.  04/27/2022, [facility]) Contact information [phone]

## 2022-02-25 NOTE — H&P (Incomplete)
Hospitalist H&P    Assessment & Plan:     Acute Problems:    # Acute Elevated Troponin  Patient has a baseline elevation of troponin in general  Was 60s-80s on last admission; here is uptrending, 114-->129  No chest pain  - Trend troponins;   - A1C, Lipid Panel, TSH; ECHO  - Cardio consult: will hold off for now, pt chest pain free, trend tropes  - B-blocker will hold off for now, can initiate within 24h, ASA, Morphine&O2 PRN    # Acute Fatigue   Patient was recently in the hospital  Likely did not get enough rehab and poor nutrition  - Check B12, Folate, Vit D  - PT/OT  - Case Mgmt consult    Chronic Problems:    # Atrial Fibrillation  - C/w home Amiodarone  - PTD Coumadin, pt is no longer supratherapeutic    # HLD  - C/w home Lipitor    # CKD  - CR seems to be around baseline  - Monitor  - Avoid nephrotoxics    # Hypothyroidism  - C/w home Synthroid    # Neuropathy  - C/w home Cymbalta and Gabapentin    Admission Parameters:    Code Status:     Full  DVT Prophylaxis:    Coumadin   GI Prophylaxis:   Protonix    Medical Decision Making:     I personally reviewed labs:  Including, but not limited to CBC, CMP  I personally reviewed imaging: All imaging done on today's admission  Toxic drug monitoring:  For any vancomycin orders  Discussed case with:    ED provider        Subjective:   PCP: Nuala Alpha, MD    Chief Complaint: ***    HPI: 88 y.o. male presents with *** duration of *** onset, ***, remitted by ***, exacerbated by ***    Further history and chart review: ***    We were asked to admit for work up and evaluation of the above problems.  Case discussed with ED specialist; Documentation from the ED reviewed, as well as outpatient charts reviewed.    ROS: 12 point ROS obtained and otherwise negative, except as per HPI and above.    Past Medical History:   Diagnosis Date    A-fib ([place])     CHF (congestive heart failure) ([place])     Hypercholesteremia     Hypothyroid       Past Surgical History:   Procedure  Laterality Date    CORONARY ANGIOPLASTY WITH STENT PLACEMENT      10 years ago     Social History     Tobacco Use    Smoking status: Never    Smokeless tobacco: Never   Substance Use Topics    Alcohol use: Not on file      No family history on file.  No Known Allergies   Prior to Admission medications    Not on File     Objective:   VITALS:      Patient Vitals for the past 24 hrs:   BP Temp Temp src Pulse Resp SpO2 Height Weight   02/25/22 1605 117/74 98 F (36.7 C) Oral 91 16 100 % 1.676 m (_0 ) 74.8 kg (165 lb)   02/25/22 1604 -- -- -- 99 -- -- -- --     Temp (24hrs), Avg:98 F (36.7 C), Min:98 F (36.7 C), Max:98 F (36.7 C)  O2 Device: None (Room air)  Wt Readings from Last 12 Encounters:   02/25/22 74.8 kg (165 lb)       LAB DATA REVIEWED:      Recent Results (from the past 12 hour(s))   Extra Tubes Hold    Collection Time: 02/25/22  4:00 PM   Result Value Ref Range    Specimen HOld  1SST, 1RED     Comment:        Add-on orders for these samples will be processed based on acceptable specimen integrity and analyte stability, which may vary by analyte.   Troponin    Collection Time: 02/25/22  4:00 PM   Result Value Ref Range    Troponin, High Sensitivity 114 (H) 0 - 76 ng/L   CBC with Auto Differential    Collection Time: 02/25/22  4:00 PM   Result Value Ref Range    WBC 4.8 4.1 - 11.1 K/uL    RBC 3.81 (L) 4.10 - 5.70 M/uL    Hemoglobin 11.4 (L) 12.1 - 17.0 g/dL    Hematocrit 35.0 (L) 36.6 - 50.3 %    MCV 91.9 80.0 - 99.0 FL    MCH 29.9 26.0 - 34.0 PG    MCHC 32.6 30.0 - 36.5 g/dL    RDW 18.2 (H) 11.5 - 14.5 %    Platelets 129 (L) 150 - 400 K/uL    MPV 9.9 8.9 - 12.9 FL    Nucleated RBCs 0.0 0 PER 100 WBC    nRBC 0.00 0.00 - 0.01 K/uL    Neutrophils % 65 32 - 75 %    Lymphocytes % 21 12 - 49 %    Monocytes % 12 5 - 13 %    Eosinophils % 1 0 - 7 %    Basophils % 1 0 - 1 %    Immature Granulocytes 0 0.0 - 0.5 %    Neutrophils Absolute 3.1 1.8 - 8.0 K/UL    Lymphocytes Absolute 1.0 0.8 - 3.5 K/UL     Monocytes Absolute 0.6 0.0 - 1.0 K/UL    Eosinophils Absolute 0.1 0.0 - 0.4 K/UL    Basophils Absolute 0.1 0.0 - 0.1 K/UL    Absolute Immature Granulocyte 0.0 0.00 - 0.04 K/UL    Differential Type AUTOMATED     Comprehensive Metabolic Panel    Collection Time: 02/25/22  4:00 PM   Result Value Ref Range    Sodium 136 136 - 145 mmol/L    Potassium 3.2 (L) 3.5 - 5.1 mmol/L    Chloride 103 97 - 108 mmol/L    CO2 29 21 - 32 mmol/L    Anion Gap 4 (L) 5 - 15 mmol/L    Glucose 141 (H) 65 - 100 mg/dL    BUN 23 (H) 6 - 20 MG/DL    Creatinine 1.90 (H) 0.70 - 1.30 MG/DL    Bun/Cre Ratio 12 12 - 20      Est, Glom Filt Rate 33 (L) >60 ml/min/1.29m    Calcium 8.2 (L) 8.5 - 10.1 MG/DL    Total Bilirubin 1.2 (H) 0.2 - 1.0 MG/DL    ALT 17 12 - 78 U/L    AST 31 15 - 37 U/L    Alk Phosphatase 118 (H) 45 - 117 U/L    Total Protein 7.2 6.4 - 8.2 g/dL    Albumin 3.0 (L) 3.5 - 5.0 g/dL    Globulin 4.2 (H) 2.0 - 4.0 g/dL    Albumin/Globulin  Ratio 0.7 (L) 1.1 - 2.2     Magnesium    Collection Time: 02/25/22  4:00 PM   Result Value Ref Range    Magnesium 2.0 1.6 - 2.4 mg/dL   Protime-INR    Collection Time: 02/25/22  4:00 PM   Result Value Ref Range    INR 2.6 (H) 0.9 - 1.1      Protime 25.5 (H) 9.0 - 11.1 sec   Troponin    Collection Time: 02/25/22  6:11 PM   Result Value Ref Range    Troponin, High Sensitivity 129 (HH) 0 - 76 ng/L     No results found.     Physical Exam:     Const'l:  Normal body habitus, a&o, no acute distress  H/N: No cervical/head mass  Eyes:    nonicteric sclera, eom intact  ENT:     aud acuity grossly intact w w/o device, no nasal deformity  Cardio:    reg rate irreg rhythm  Pulm:    no accessory muscle use  Abd:     sntnd   Derm:    no rashes, no ulcers, no lesions  Extr:    no edema, no cyanosis, no calf tenderness  Neuro:   cn II-XII intact  Psych: mood intact, judgement intact  _______________________________________________________________    Critical Care Provided:     Minutes non procedure based    Procedures:  see electronic medical records for all procedures/Xrays and details which were not copied into this note but were reviewed prior to creation of Plan.

## 2022-02-25 NOTE — ED Notes (Signed)
Provider was notified about pt's elevated trop level that increased from 129 to 139     Mason Fields, [place]  02/25/22 2232

## 2022-02-26 ENCOUNTER — Inpatient Hospital Stay: Admit: 2022-02-26 | Payer: MEDICARE | Primary: Family Medicine

## 2022-02-26 DIAGNOSIS — R778 Other specified abnormalities of plasma proteins: Secondary | ICD-10-CM

## 2022-02-26 DIAGNOSIS — I083 Combined rheumatic disorders of mitral, aortic and tricuspid valves: Secondary | ICD-10-CM | POA: Diagnosis not present

## 2022-02-26 DIAGNOSIS — I5031 Acute diastolic (congestive) heart failure: Secondary | ICD-10-CM | POA: Diagnosis not present

## 2022-02-26 DIAGNOSIS — I878 Other specified disorders of veins: Secondary | ICD-10-CM | POA: Diagnosis not present

## 2022-02-26 LAB — ECHO (TTE) COMPLETE (PRN CONTRAST/BUBBLE/STRAIN/3D)
AV Area by Peak Velocity: 1.8 cm2
AV Area by VTI: 1.7 cm2
AV Mean Gradient: 9 mmHg
AV Mean Velocity: 1.4 m/s
AV Peak Gradient: 17 mmHg
AV Peak Velocity: 2.1 m/s
AV VTI: 34.2 cm
AV Velocity Ratio: 0.52
AVA/BSA Peak Velocity: 1 cm2/m2
AVA/BSA VTI: 0.9 cm2/m2
Aortic Arch: 2.8 cm
Ascending Aorta Index: 1.74 cm/m2
Ascending Aorta: 3.2 cm
Body Surface Area: 1.87 m2
E/E' Lateral: 8.3
E/E' Ratio (Averaged): 10.08
E/E' Septal: 11.86
Fractional Shortening 2D: 6 % (ref 28–44)
IVSd: 1.2 cm — AB (ref 0.6–1.0)
LA Diameter: 4.6 cm
LA Size Index: 2.5 cm/m2
LA Volume A-L A4C: 73 mL — AB (ref 18–58)
LA Volume A-L A4C: 77 mL — AB (ref 18–58)
LA Volume A-L A4C: 77 mL — AB (ref 18–58)
LA Volume A-L A4C: 80 mL — AB (ref 18–58)
LA Volume A/L: 84 mL
LA Volume BP: 79 mL — AB (ref 18–58)
LA Volume Index A/L: 46 mL/m2 (ref 16–34)
LA Volume Index BP: 43 ml/m2 — AB (ref 16–34)
LV E' Lateral Velocity: 10 cm/s
LV E' Septal Velocity: 7 cm/s
LV EDV A2C: 111 mL
LV EDV A4C: 92 mL
LV EDV BP: 105 mL (ref 67–155)
LV EDV Index A2C: 60 mL/m2
LV EDV Index A4C: 50 mL/m2
LV EDV Index BP: 57 mL/m2
LV ESV A2C: 81 mL
LV ESV A4C: 48 mL
LV ESV BP: 62 mL — AB (ref 22–58)
LV ESV Index A2C: 44 mL/m2
LV ESV Index A4C: 26 mL/m2
LV ESV Index BP: 34 mL/m2
LV Mass 2D Index: 129.3 g/m2 — AB (ref 49–115)
LV Mass 2D: 237.9 g — AB (ref 88–224)
LV RWT Ratio: 0.6
LVIDd Index: 2.55 cm/m2
LVIDd: 4.7 cm (ref 4.2–5.9)
LVIDs Index: 2.39 cm/m2
LVIDs: 4.4 cm
LVOT Area: 3.5 cm2
LVOT Diameter: 2.1 cm
LVOT Mean Gradient: 2 mmHg
LVOT Peak Gradient: 4 mmHg
LVOT Peak Velocity: 1.1 m/s
LVOT SV: 56.8 ml
LVOT Stroke Volume Index: 30.9 mL/m2
LVOT VTI: 16.4 cm
LVOT:AV VTI Index: 0.48
LVPWd: 1.4 cm — AB (ref 0.6–1.0)
MV A Velocity: 0.45 m/s
MV E Velocity: 0.83 m/s
MV E Wave Deceleration Time: 152.1 ms
MV E/A: 1.84
RV Free Wall Peak S': 9 cm/s
RVIDd: 3.8 cm
TAPSE: 1.6 cm — AB (ref 1.7–?)
TR Max Velocity: 2.54 m/s
TR Peak Gradient: 26 mmHg

## 2022-02-26 LAB — COMPREHENSIVE METABOLIC PANEL
ALT: 18 U/L (ref 12–78)
AST: 33 U/L (ref 15–37)
Albumin/Globulin Ratio: 0.7 — ABNORMAL LOW (ref 1.1–2.2)
Albumin: 3 g/dL — ABNORMAL LOW (ref 3.5–5.0)
Alk Phosphatase: 114 U/L (ref 45–117)
Anion Gap: 6 mmol/L (ref 5–15)
BUN: 22 MG/DL — ABNORMAL HIGH (ref 6–20)
Bun/Cre Ratio: 12 (ref 12–20)
CO2: 28 mmol/L (ref 21–32)
Calcium: 7.9 MG/DL — ABNORMAL LOW (ref 8.5–10.1)
Chloride: 104 mmol/L (ref 97–108)
Creatinine: 1.86 MG/DL — ABNORMAL HIGH (ref 0.70–1.30)
Est, Glom Filt Rate: 34 mL/min/{1.73_m2} — ABNORMAL LOW (ref >60)
Globulin: 4.1 g/dL — ABNORMAL HIGH (ref 2.0–4.0)
Glucose: 92 mg/dL (ref 65–100)
Potassium: 3.9 mmol/L (ref 3.5–5.1)
Sodium: 138 mmol/L (ref 136–145)
Total Bilirubin: 1.7 MG/DL — ABNORMAL HIGH (ref 0.2–1.0)
Total Protein: 7.1 g/dL (ref 6.4–8.2)

## 2022-02-26 LAB — LIPID PANEL
Chol/HDL Ratio: 2 (ref 0.0–5.0)
Cholesterol, Total: 127 MG/DL (ref <200)
HDL: 62 MG/DL
LDL Calculated: 51 MG/DL (ref 0–100)
Triglycerides: 70 MG/DL (ref <150)
VLDL Cholesterol Calculated: 14 MG/DL

## 2022-02-26 LAB — EKG 12-LEAD
Atrial Rate: 267 {beats}/min
Q-T Interval: 388 ms
QRS Duration: 98 ms
QTc Calculation (Bazett): 490 ms
R Axis: -28 degrees
T Axis: 106 degrees
Ventricular Rate: 96 {beats}/min

## 2022-02-26 LAB — CBC
Hematocrit: 33.9 % — ABNORMAL LOW (ref 36.6–50.3)
Hemoglobin: 11.1 g/dL — ABNORMAL LOW (ref 12.1–17.0)
MCH: 30.3 PG (ref 26.0–34.0)
MCHC: 32.7 g/dL (ref 30.0–36.5)
MCV: 92.6 FL (ref 80.0–99.0)
MPV: 10.2 FL (ref 8.9–12.9)
Nucleated RBCs: 0 PER 100 WBC
Platelets: 137 10*3/uL — ABNORMAL LOW (ref 150–400)
RBC: 3.66 M/uL — ABNORMAL LOW (ref 4.10–5.70)
RDW: 18 % — ABNORMAL HIGH (ref 11.5–14.5)
WBC: 6.2 10*3/uL (ref 4.1–11.1)
nRBC: 0 10*3/uL (ref 0.00–0.01)

## 2022-02-26 LAB — PROTIME-INR
INR: 2.5 — ABNORMAL HIGH (ref 0.9–1.1)
Protime: 24.6 s — ABNORMAL HIGH (ref 9.0–11.1)

## 2022-02-26 LAB — T4, FREE: T4 Free: 1.3 NG/DL (ref 0.8–1.5)

## 2022-02-26 LAB — VITAMIN B12 & FOLATE
Folate: 16.8 ng/mL (ref 5.0–21.0)
Vitamin B-12: 622 pg/mL (ref 193–986)

## 2022-02-26 LAB — TROPONIN
Troponin, High Sensitivity: 139 ng/L (ref 0–76)
Troponin, High Sensitivity: 122 ng/L (ref 0–76)
Troponin, High Sensitivity: 142 ng/L (ref 0–76)
Troponin, High Sensitivity: 159 ng/L (ref 0–76)

## 2022-02-26 LAB — HEMOGLOBIN A1C
Hemoglobin A1C: 5 % (ref 4.0–5.6)
eAG: 97 mg/dL

## 2022-02-26 LAB — VITAMIN D 25 HYDROXY: Vit D, 25-Hydroxy: 33.8 ng/mL (ref 30–100)

## 2022-02-26 LAB — MAGNESIUM: Magnesium: 1.8 mg/dL (ref 1.6–2.4)

## 2022-02-26 MED ORDER — GABAPENTIN 100 MG PO CAPS
100 MG | Freq: Three times a day (TID) | ORAL | Status: DC
Start: 2022-02-26 — End: 2022-02-26
  Administered 2022-02-26: 14:00:00 100 mg via ORAL

## 2022-02-26 MED ORDER — DULOXETINE HCL 30 MG PO CPEP
30 MG | Freq: Every day | ORAL | Status: DC
Start: 2022-02-26 — End: 2022-02-26
  Administered 2022-02-26: 14:00:00 30 mg via ORAL

## 2022-02-26 MED ORDER — LEVOTHYROXINE SODIUM 50 MCG PO TABS
50 MCG | Freq: Every day | ORAL | Status: DC
Start: 2022-02-26 — End: 2022-02-26
  Administered 2022-02-26: 14:00:00 75 ug via ORAL

## 2022-02-26 MED FILL — PANTOPRAZOLE SODIUM 40 MG IV SOLR: 40 MG | INTRAVENOUS | Qty: 40

## 2022-02-26 MED FILL — GABAPENTIN 100 MG PO CAPS: 100 MG | ORAL | Qty: 1

## 2022-02-26 MED FILL — POTASSIUM CHLORIDE 10 MEQ/100ML IV SOLN: 10 MEQ/0ML | INTRAVENOUS | Qty: 100

## 2022-02-26 MED FILL — DULOXETINE HCL 30 MG PO CPEP: 30 MG | ORAL | Qty: 1

## 2022-02-26 MED FILL — HEALTHYLAX 17 G PO PACK: 17 g | ORAL | Qty: 1

## 2022-02-26 MED FILL — LEVOTHYROXINE SODIUM 50 MCG PO TABS: 50 MCG | ORAL | Qty: 2

## 2022-02-26 NOTE — Progress Notes (Signed)
Physician Progress Note      PATIENT:               Mason Fields, Mason Fields  CSN #:                  [id number]  DOB:                       05/06/1929  ADMIT DATE:       02/25/2022 3:57 PM  DISCH DATE:        02/26/2022 3:38 PM  RESPONDING  PROVIDER #:        Salvadore Dom MD          QUERY TEXTPeri Jefferson afternoon  Patient admitted with troponemia, noted to have atrial fibrillation and is   maintained on Coumadin.    If possible, please document in progress notes and discharge summary if you   are evaluating and/or treating any of the following:    The medical record reflects the following:  Risk Factors: troponemia, Heart failure, Afib  Clinical Indicators: Patient presented with fatigue and inability to sleep.   Workup is mostly unrevealing except for troponin which is slightly above   baseline  Treatment: EKG, cardiology consult, daily labs, PO Coumadin    Thank you  Katina Degree RN CDI  [phone]  Options provided:  -- Secondary hypercoagulable state in a patient with atrial fibrillation  -- Secondary hypercoagulable state in a patient with atrial fibrillation not   present  -- Other - I will add my own diagnosis  -- Disagree - Not applicable / Not valid  -- Disagree - Clinically unable to determine / Unknown  -- Refer to Clinical Documentation Reviewer    PROVIDER RESPONSE TEXT:    This patient has secondary hypercoagulable state in a patient with atrial   fibrillation.    Query created by: Katina Degree on 03/14/2022 1:14 PM      Electronically signed by:  Salvadore Dom MD 03/19/2022 1:03 PM

## 2022-02-26 NOTE — ED Notes (Signed)
Trop 159, critical lab value given to primary rn sara by this rn.      Norva Riffle, RN  02/26/22 [phone]

## 2022-02-26 NOTE — Progress Notes (Signed)
[facility]                    Cardiology Care Note     [x] Initial Encounter     [] Follow-up    Patient Name: Mason Fields - DOB:11/12/1928 - ZOX:[id number]  Primary Cardiologist: none   Consulting Cardiologist: Victorino DecemberVikas K Rathi, MD     [facility] mild [facility] elevation     HPI:       Mason Fields is a 88 y.o. male with PMH significant for persistent a-fib on coumadin, CHF, HLD, hypothyroidism, neuropathy, CKD, memory impairment and frequent falls.     Pt is a poor historian, states he's here for rib pain. However other notes suggest he came in due to increasing fatigue and insomnia. Stated to ED physician that he has felt generally unwell since his prior admission in April and more unsteady on his feet. He denies any CP, palpitations, SOB/DOE or edema. Of note, he was seen by Dr. Welton FlakesKhan in April to discuss changing his coumadin to Eliquis however pt was discharged before this was changed. Did not follow up with our office.     Subjective:      Mason Fields reports none.     Assessment and Plan     A-fib: rate controlled, prev had recommended stopping coumadin and starting Eliquis 2.5 mg bid, however given his falls, multiple lacerations and wounds to his face need to look at risk vs benefit to continuing AC. Stop coumadin for now     2. Elevated troponin: flat, 120-150's. Pt without anginal symptoms. Likely would not pursue ischemic eval as IP, can follow OP. Would recommend addressing goals of care     3. Hx of CHF: listed in records, unknown EF. TTE ordered. Tbili elevated to 1.7      4. CKD stage III/IV: Cr 1.86    5. Memory impairment, frequent falls/debility          ____________________________________________________________    Cardiac testing  No results found for this or any previous visit.    No results found for this or any previous visit.    No results found for this or any previous visit.      Most recent HS troponins:  Recent Labs      02/26/22  0011 02/26/22  0414 02/26/22  0726   TROPHS 122* 142* 159*       ECG: atrial fibrillation, rate 80-90's    Review of Systems:    [] All other systems reviewed and all negative except as written in HPI    [x]  Patient unable to provide secondary to condition    Past Medical History:   Diagnosis Date    A-fib (HCC)     CHF (congestive heart failure) (HCC)     Chronic anticoagulation     CKD (chronic kidney disease), stage III (HCC)     Hypercholesteremia     Hypothyroid      Past Surgical History:   Procedure Laterality Date    CORONARY ANGIOPLASTY WITH STENT PLACEMENT      10 years ago     Social Hx:  reports that he has never smoked. He has never used smokeless tobacco. He reports that he does not use drugs.  Family Hx: family history is not on file.  No Known Allergies       OBJECTIVE:  Wt Readings from Last 3 Encounters:   02/25/22 74.8 kg (165 lb)     I/O last  3 completed shifts:  In: -   Out: 400 [Urine:400]  No intake/output data recorded.    Physical Exam:    Vitals:   Vitals:    02/26/22 0500 02/26/22 0600 02/26/22 0700 02/26/22 0753   BP: 110/76 124/87  119/78   Pulse: (!) 107 100 (!) 106 95   Resp: 20 13  18    Temp:    98.1 F (36.7 C)   TempSrc:    Oral   SpO2: 98% 98%  97%   Weight:       Height:         Telemetry: AFIB    Gen: Well-developed, well-nourished, in no acute distress. 2 ulcerations to face, oozing blood off/on   Neck: Supple, No JVD, No Carotid Bruit  Resp: No accessory muscle use, Clear breath sounds, No rales or rhonchi  Card: Irregular Rate,Rythm, Normal S1, S2, No murmurs, rubs or gallop. No thrills.   Abd:   Soft, non-tender, non-distended, BS+   MSK: No cyanosis  Skin: No rashes    Neuro: Moving all four extremities, follows commands appropriately  Psych: Poor insight, oriented to person, place, alert, Nml Affect  LE: No edema    Data Review:     Radiology:   XR Results (most recent):  Xray Result (most recent):  XR CHEST STANDARD TWO VW 01/13/2022    Narrative  Indication:   Eval for Infiltrate    Exam: PA and lateral views of the chest.    Direct comparison is made to prior CXR dated CT dated January 10, 2022.    Findings: Cardiomediastinal silhouette is mildly enlarged, but stable. There is  a small right pleural effusion and trace left pleural fluid. There is mild left  basilar atelectasis. There is no pneumothorax. There are T12 and L1 compression  deformities, unchanged compared to prior CT dated January 10, 2022. The osseous  structures are diffusely demineralized    Impression  Stable cardiomegaly. Right pleural effusion and trace left pleural  fluid. Mild left basilar atelectasis.      CT Result (most recent):  CT CHEST ABDOMEN PELVIS WO CONTRAST 01/10/2022    Narrative  CLINICAL HISTORY: fall onto head, syncope, on coumadin  INDICATION: fall onto head, syncope, on coumadin  COMPARISON: None.  CT dose reduction was achieved through use of a standardized protocol tailored  for this examination and automatic exposure control for dose modulation.  TECHNIQUE: Serial axial images with a collimation of 5 mm were obtained from the  skull base through the vertex  FINDINGS:  There is sulcal and ventricular prominence. Confluent periventricular and  scattered foci of hypodensity in the cerebral white matter. There is no evidence  of an acute infarction, hemorrhage, or mass-effect. There is no evidence of  midline shift or hydrocephalus. Posterior fossa structures are unremarkable. No  extra-axial collections are seen.  Mastoid air cells are well pneumatized and clear.  Vertebral vascular calcifications.    Impression  No acute intracranial process.    Imaging findings consistent with minimal chronic microvascular ischemic change.  There is a mild degree of cerebral atrophy.    Clinical history: fall onto head, syncope, on coumadin  INDICATION:   fall onto head, syncope, on coumadin  COMPARISON:  None    TECHNIQUE:  Thin axial images were obtained through the chest, abdomen and pelvis.  Coronal  and sagittal reconstructions were generated. Oral contrast was not administered.      CT dose reduction was achieved through use of a  standardized protocol tailored  for this examination and automatic exposure control for dose modulation.  Adaptive statistical iterative reconstruction (ASIR) was utilized.    FINDINGS:    SUPRACLAVICULAR REGION: No supraclavicular adenopathy.  MEDIASTINUM:    There is cardiomegaly. Aortic atherosclerotic change. Small hiatal hernia.  CORONARY ARTERY CALCIUM: Present.  No hilar or mediastinal lymphadenopathy. No  esophageal mass. No endotracheal or endobronchial mass.  PLEURA/LUNGS:: Right-sided greater than left-sided pleural effusion. Chronic  pleural convex on the right. Posterior rib fractures at T12 and T11.    SOFT TISSUE/ AXILLA: No axillary adenopathy. No chest wall mass.  LIVER/GALLBLADDER: Hepatic cyst There is no intrahepatic duct dilatation. The  CBD is not dilated.  SPLEEN/PANCREAS: No mass.. There is no pancreatic mass. There is no pancreatic  duct dilatation. There is no evidence of splenomegaly.  ADRENALS/KIDNEYS: No mass, calculus, or hydronephrosis.. There is no adrenal  mass. There is no hydroureter or hydronephrosis. There is no perinephric mass.  No ureteral or bladder calculus.  STOMACH, COLON AND SMALL BOWEL: Fecal stasis. Colonic diverticulosis. There is  no obstruction or free intraperitoneal air. There is no evidence of  incarceration or obstruction. No mesenteric adenopathy. No retroperitoneal  adenopathy.  APPENDIX: Unremarkable.  PERITONEUM/RETROPERITONEUM: No mass or calculus. There is no abdominal aortic  aneurysm.  URINARY BLADDER: Bladder distention with bladder diverticulum.  PELVIS: Unremarkable. There is no pelvic sidewall mass. There is no inguinal  adenopathy.  BONES: Right posterior rib fractures at T12 and T11. Vertebral compression  fractures at T12-L1 and L3 are age indeterminate. Left T8 rib fracture is likely  chronic in nature.. No  lytic or blastic lesions.    IMPRESSION:  Displaced posterior rib fractures on the right at T11 and T12 are associated  with a small right-sided pleural effusion.    Age indeterminate vertebral compression fractures at T12-L1 and L3.    Chronic right rib fracture.    No other fracture or dislocation.  Bladder distention with bladder diverticulum.  Incidental and/or nonemergent findings are as described in detail above.      Lab Results   Component Value Date/Time    NA 138 02/26/2022 04:14 AM    K 3.9 02/26/2022 04:14 AM    CL 104 02/26/2022 04:14 AM    CO2 28 02/26/2022 04:14 AM    BUN 22 02/26/2022 04:14 AM    CREATININE 1.86 02/26/2022 04:14 AM    GLUCOSE 92 02/26/2022 04:14 AM    CALCIUM 7.9 02/26/2022 04:14 AM    LABGLOM 34 02/26/2022 04:14 AM      Lab Results   Component Value Date    BNP 5,209 (H) 01/13/2022     Lab Results   Component Value Date    WBC 6.2 02/26/2022    HGB 11.1 (L) 02/26/2022    HCT 33.9 (L) 02/26/2022    MCV 92.6 02/26/2022    PLT 137 (L) 02/26/2022     Recent Labs     02/25/22  1600   CHOL 127       Current meds:    Current Facility-Administered Medications:     gabapentin (NEURONTIN) capsule 100 mg, 100 mg, Oral, TID, Salvadore Dom, MD, 100 mg at 02/26/22 0954    DULoxetine (CYMBALTA) extended release capsule 30 mg, 30 mg, Oral, Daily, Salvadore Dom, MD, 30 mg at 02/26/22 0954    levothyroxine (SYNTHROID) tablet 75 mcg, 75 mcg, Oral, Daily, Salvadore Dom, MD, 75 mcg at 02/26/22 0952    acetaminophen (TYLENOL)  tablet 650 mg, 650 mg, Oral, Q4H PRN, Tillie Rung, DO    polyethylene glycol (GLYCOLAX) packet 17 g, 17 g, Oral, BID, Tarun Srivastava, DO, 17 g at 02/26/22 0954    pantoprazole (PROTONIX) 40 mg in sodium chloride (PF) 0.9 % 10 mL injection, 40 mg, IntraVENous, Daily, Tillie Rung, DO, 40 mg at 02/26/22 0954    nitroGLYCERIN (NITROSTAT) SL tablet 0.4 mg, 0.4 mg, SubLINGual, Q5 Min PRN, Tillie Rung, DO    potassium chloride (KLOR-CON) extended release tablet 40  mEq, 40 mEq, Oral, Daily with breakfast, Tillie Rung, DO, 40 mEq at 02/26/22 1610    prochlorperazine (COMPAZINE) injection 2.5 mg, 2.5 mg, IntraVENous, Q6H PRN, Tillie Rung, DO    warfarin placeholder: dosing by pharmacy, , Other, RX Placeholder, Tillie Rung, DO  No current outpatient medications on file.    Greig Right, APRN - NP    [facility]  Call center: (P) [phone]  (F) [phone]      CC:GAL Sharmon Leyden, MD

## 2022-02-26 NOTE — Care Coordination-Inpatient (Addendum)
CARE MANAGEMENT NOTE      Pt has dc orders on chart. CM met with Pt, spouse, and daughter at bedside. Spouse confirms that Pt is currently open to [facility]. CM sent ROC orders to [facility] via [employer].     2:47 PM  CM received notification via [employer] that [facility] has accepted the referral. Info added to AVS.    _____________________________________  Alejandro Mulling, [facility]   Available via [facility]  02/26/2022   2:38 PM

## 2022-02-26 NOTE — Consults (Signed)
See cardiology initial encounter note

## 2022-02-26 NOTE — Discharge Instructions (Addendum)
Continuity of Care Form    Patient Name: Mason Fields   DOB:  [birth year]  MRN:  [id number]    Admit date:  02/25/2022  Discharge date:  ***    Code Status Order: Full Code   Advance Directives:     Admitting Physician:  Tillie Rung, DO  PCP: Ballard Russell, MD    Discharging Nurse: [facility] Unit/Room#: ER09/09  Discharging Unit Phone Number: ***    Emergency Contact:   Extended Emergency Contact Information  Primary Emergency Contact: Haberland,Lynn  Mobile Phone: [phone]  Relation: Spouse    Past Surgical History:  Past Surgical History:   Procedure Laterality Date    CORONARY ANGIOPLASTY WITH STENT PLACEMENT      10 years ago       Immunization History:   Immunization History   Administered Date(s) Administered    TD 5LF, TENIVAC, (age 7y+), IM, 0.2mL 01/10/2022       Active Problems:  Patient Active Problem List   Diagnosis Code    Rib fractures S22.49XA    Elevated troponin R77.8    Insomnia G47.00    Hypoalbuminemia E88.09    Anemia D64.9    Thrombocytopenia (HCC) D69.6    A-fib (HCC) I48.91    CHF (congestive heart failure) (HCC) I50.9    Chronic anticoagulation Z79.01    CKD (chronic kidney disease), stage III (HCC) N18.30    Hypercholesteremia E78.00    Hypothyroid E03.9    Sinus tachycardia R00.0    Fatigue R53.83    CHF (congestive heart failure), NYHA class II, chronic, systolic (HCC) I50.22    Right atrial dilation I51.7    Right heart failure (HCC) I50.810       Isolation/Infection:   Isolation            No Isolation          Patient Infection Status       None to display            Nurse Assessment:  Last Vital Signs: BP (!) 112/93   Pulse 97   Temp 98.1 F (36.7 C) (Oral)   Resp 18   Ht 1.676 m (5\' 6" )   Wt 74.8 kg (165 lb)   SpO2 97%   BMI 26.63 kg/m     Last documented pain score (0-10 scale):    Last Weight:   Wt Readings from Last 1 Encounters:   02/26/22 74.8 kg (165 lb)     Mental Status:  {IP PT MENTAL STATUS:20030}    IV Access:  {MH COC IV  ACCESS:304088262}    Nursing Mobility/ADLs:  Walking   {CHP DME 04/28/22  Transfer  {CHP DME TDSK:876811572}  Bathing  {CHP DME IOMB:559741638}  Dressing  {CHP DME GTXM:468032122}  Toileting  {CHP DME QMGN:003704888}  Feeding  {CHP DME BVQX:450388828}  Med Admin  {CHP DME MKLK:917915056}  Med Delivery   {MH COC MED Delivery:304088264}    Wound Care Documentation and Therapy:        Elimination:  Continence:   Bowel: {YES / PVXY:801655374}  Bladder: {YES / MO:70786}  Urinary Catheter: {Urinary Catheter:304088013}   Colostomy/Ileostomy/Ileal Conduit: {YES / LJ:44920}       Date of Last BM: ***    Intake/Output Summary (Last 24 hours) at 02/26/2022 1416  Last data filed at 02/25/2022 2311  Gross per 24 hour   Intake --   Output 400 ml   Net -400 ml     I/O last 3 completed  shifts:  In: -   Out: 400 [Urine:400]    Safety Concerns:     {MH COC Safety Concerns:304088272}    Impairments/Disabilities:      {MH COC Impairments/Disabilities:304088273}    Nutrition Therapy:  Current Nutrition Therapy:   {MH COC Diet List:304088271}    Routes of Feeding: {CHP DME Other Feedings:304088042}  Liquids: {Slp liquid thickness:30034}  Daily Fluid Restriction: {CHP DME Yes amt example:304088041}  Last Modified Barium Swallow with Video (Video Swallowing Test): {Done Not Done UXNA:355732202}    Treatments at the Time of Hospital Discharge:   Respiratory Treatments: ***  Oxygen Therapy:  {Therapy; copd oxygen:17808}  Ventilator:    {MH CC Vent RKYH:062376283}    Rehab Therapies: {THERAPEUTIC INTERVENTION:249-543-5914}  Weight Bearing Status/Restrictions: {MH CC Weight Bearing:304508812}  Other Medical Equipment (for information only, NOT a DME order):  {EQUIPMENT:304520077}  Other Treatments: ***    Patient's personal belongings (please select all that are sent with patient):  {CHP DME Belongings:304088044}    RN SIGNATURE:  {Esignature:304088025}    CASE MANAGEMENT/SOCIAL WORK SECTION    Inpatient Status Date: ***    Readmission Risk  Assessment Score:  Readmission Risk              Risk of Unplanned Readmission:  19           Discharging to Facility/ Agency   Name:   Address:  Phone:  Fax:    Dialysis Facility (if applicable)   Name:  Address:  Dialysis Schedule:  Phone:  Fax:    Case Manager/Social Worker signature: {Esignature:304088025}    PHYSICIAN SECTION    Prognosis: Fair    Condition at Discharge: Stable    Rehab Potential (if transferring to Rehab): Fair    Recommended Labs or Other Treatments After Discharge: none    Physician Certification: I certify the above information and transfer of Jarmarcus Wambold  is necessary for the continuing treatment of the diagnosis listed and that he requires Home Care for greater 30 days.     Update Admission H&P: No change in H&P    PHYSICIAN SIGNATURE:  Electronically signed by Esperanza Sheets, MD on 02/26/22 at 2:16 PM EDT

## 2022-02-26 NOTE — Progress Notes (Signed)
Physical Therapy Note:  Chart reviewed and PT orders acknowledged.  Pt received sitting on EOB with wife at bedside.  Pt and wife declining PT eval due to MD stating pt is being discharged at this time.  Pt willing to demonstrate Mod I gait with SPC for 25' with Mod I appearing stable.  PT order completed per pt and family request.   Serita Grit, PT

## 2022-02-26 NOTE — Discharge Instructions (Signed)
Patient Discharge Instructions    Mason Fields / [id number] DOB: [birth year]    Admitted 02/25/2022 Discharged: 02/26/2022     Primary Diagnoses  @Rprob @    Take Home Medications       It is important that you take the medication exactly as they are prescribed.   Keep your medication in the bottles provided by the pharmacist and keep a list of the medication names, dosages, and times to be taken in your wallet.   Do not take other medications without consulting your doctor.       What to do at Home    Recommended diet: regular diet    Recommended activity: activity as tolerated    If you experience worse symptoms, please follow up with your PCP or cardiology.    Follow-up with your PCP in a few weeks    @FOLLOWUPSECTION @     Information obtained by :  I understand that if any problems occur once I am at home I am to contact my physician.    I understand and acknowledge receipt of the instructions indicated above.                                                                                                                                           Physician's or R.N.'s Signature                                                                  Date/Time                                                                                                                                              Patient or                                                          Date/Time

## 2022-02-26 NOTE — Progress Notes (Signed)
1450  Patient discharge paperwork covered with patient and family.  All questions answered.  Patient taken out to front lobby in wheelchair by RN to awaiting family member.

## 2022-02-26 NOTE — ED Notes (Signed)
Change of shift report given to Sara RN     [place] Meeya Goldin, RN  02/26/22 [phone]

## 2022-02-26 NOTE — Progress Notes (Signed)
Occupational therapy note:  Orders received, chart reviewed.  Patient and wife declining therapy evaluation at this time.  Patient wife reports MD just informed of discharge shortly.  Patient quickly demonstrated ambulation with use of SPC and performs at MOD I level.  Patient and spouse appreciative but decline therapy evaluation.  Orders completed.  Malva Diesing C. Alastor Kneale, MS OTR/L

## 2022-02-26 NOTE — Discharge Summary (Signed)
Physician Discharge Summary     Patient ID:  Mason Fields  [id number]  88 y.o.  1929/05/08    Admit date: 02/25/2022    Discharge date of service and time: 02/26/2022  Greater than 30 minutes were spent providing discharge related services for this patient    Admission Diagnoses: Elevated troponin [R77.8]    Discharge Diagnoses:    Principal Diagnosis   Elevated troponin                                             Hospital Course and other diagnoses  Fatigue / Debilitated patient / Rib fractures / Insomnia - POA, stable. The patient did not make a recovery as hoped for, mainly due to age, but his combined right and left heart failure certainly contribute.  PT OT evaluated, resume HH PT.  Decrease gabapentin dose. Continue duloxetine.  Follow up with cardiology     Elevated troponin - POA, minimal and expected based on the age and CKD of patient.  No further workup warranted.     Systolic CHF (congestive heart failure), grade 2, chronic / Right heart failure / Right atrial dilation - POA, likely stable stable. LV EF 25% on ECHO. Consulted cardiology.  Follow  up with Dr Melvyn Novas.  Resume home lasix and lisinopril      A-fib / tachycardia / Chronic anticoagulation - POA, appear stable. Patient no longer taking amiodarone. Stop coumadin.     CKD (chronic kidney disease), stage III - POA, stable.     Hypoalbuminemia - POA, likely due to poor PO intake.      Anemia / Thrombocytopenia - POA, check serologies. No active bleeding     Hypercholesteremia - Check panel.  May not need to continue statin in this elderly man      Hypothyroid - TSH a bit high.  Resume synthroid at 77     PCP: GAL Sharmon Leyden, MD    Consults: cardiology    Significant Diagnostic Studies: See Hospital Course    Discharged home in improved condition.    Discharge Exam:      Vitals:     02/26/22 0753   BP: 119/78   Pulse: 95   Resp: 18   Temp: 98.1 F (36.7 C)   TempSrc: Oral   SpO2: 97%   Weight:  74.8 kg   Height:  167 cm           Gen:  Well-developed,  well-nourished, in no acute distress  HEENT:  Pink conjunctivae, PERRL, hearing intact to voice, moist mucous membranes  Neck:  Supple, without masses, thyroid non-tender  Resp:  No accessory muscle use, clear breath sounds without wheezes rales or rhonchi  Card:  No murmurs, normal S1, S2 without thrills, bruits or peripheral edema  Abd:  Soft, non-tender, non-distended, normoactive bowel sounds are present, no mass  Lymph:  No cervical or inguinal adenopathy  Musc:  No cyanosis or clubbing  Skin:  No rashes or ulcers, skin turgor is good  Neuro:  Cranial nerves are grossly intact, general motor weakness, follows commands appropriately  Psych:  Good insight, oriented to person, place and time, alert    Patient Instructions:   There are no discharge medications for this patient.    Activity: activity as tolerated  Diet: regular diet  Wound Care: none needed    Follow-up with your PCP  and cardiology in a few weeks.  Follow-up tests/labs - none    Signed:  Gara Kroner, MD  02/26/2022  2:11 PM

## 2022-02-26 NOTE — Progress Notes (Signed)
[facility]  [address] [place], [place]  [address]  [phone]    Hospitalist Progress Note      NAME: Mason Fields   DOB:  [birth year]  MRM:  [id number]    Date/Time of service 02/26/2022  8:25 AM    To assist coordination of care and communication with nursing and staff, this note may be preliminary early in the day, but finalized by end of the day.        Assessment and Plan:     Fatigue / Debilitated patient / Rib fractures / Insomnia - POA, stable. The patient did not make a recovery as hoped for, mainly due to age.  PT OT eval.  Decrease gabapentin dose. Continue duloxetine.    Elevated troponin - POA, minimal and expected based on the age and CKD of patient.  I do not think any further workup warranted.    CHF (congestive heart failure) - POA, appears stable. Check ECHO. Consult cardiology.      A-fib / tachycardia / Chronic anticoagulation - POA, appear stable. Patient no longer taking amiodarone. Continue coumadin.    CKD (chronic kidney disease), stage III - POA, stable.    Hypoalbuminemia - POA, likely due to poor PO intake.      Anemia / Thrombocytopenia - POA, check serologies. No active bleeding    Hypercholesteremia - Check panel.  May not need to continue statin in this elderly man      Hypothyroid - TSH a bit high.  Resume synthroid at 77       Subjective:     Chief Complaint:  weak    ROS:  (bold if positive, if negative)    Tolerating some PT  Tolerating Diet        Objective:     Last 24hrs VS reviewed since prior progress note. Most recent are:    Vitals:    02/26/22 0500 02/26/22 0600 02/26/22 0700 02/26/22 0753   BP: 110/76 124/87  119/78   Pulse: (!) 107 100 (!) 106 95   Resp: 20 13  18    Temp:    98.1 F (36.7 C)   TempSrc:    Oral   SpO2: 98% 98%  97%   Weight:       Height:          @lastspo2withflo @       Intake/Output Summary (Last 24 hours) at 02/26/2022 0825  Last data filed at 02/25/2022 2311  Gross per 24 hour   Intake --   Output 400 ml   Net  -400 ml        Physical Exam:    Gen:  Well-developed, well-nourished, in no acute distress  HEENT:  Pink conjunctivae, PERRL, hearing intact to voice, moist mucous membranes  Neck:  Supple, without masses, thyroid non-tender  Resp:  No accessory muscle use, clear breath sounds without wheezes rales or rhonchi  Card:  No murmurs, normal S1, S2 without thrills, bruits or peripheral edema  Abd:  Soft, non-tender, non-distended, normoactive bowel sounds are present, no mass  Lymph:  No cervical or inguinal adenopathy  Musc:  No cyanosis or clubbing  Skin:  No rashes or ulcers, skin turgor is good  Neuro:  Cranial nerves are grossly intact, general motor weakness, follows commands appropriately  Psych:  Good insight, oriented to person, place and time, alert    Telemetry reviewed:   normal sinus rhythm  __________________________________________________________________  Medications Reviewed: (see below)  Medications:     Current Facility-Administered Medications   Medication Dose Route Frequency    gabapentin (NEURONTIN) capsule 100 mg  100 mg Oral TID    DULoxetine (CYMBALTA) extended release capsule 30 mg  30 mg Oral Daily    levothyroxine (SYNTHROID) tablet 75 mcg  75 mcg Oral Daily    acetaminophen (TYLENOL) tablet 650 mg  650 mg Oral Q4H PRN    polyethylene glycol (GLYCOLAX) packet 17 g  17 g Oral BID    pantoprazole (PROTONIX) 40 mg in sodium chloride (PF) 0.9 % 10 mL injection  40 mg IntraVENous Daily    nitroGLYCERIN (NITROSTAT) SL tablet 0.4 mg  0.4 mg SubLINGual Q5 Min PRN    potassium chloride (KLOR-CON) extended release tablet 40 mEq  40 mEq Oral Daily with breakfast    prochlorperazine (COMPAZINE) injection 2.5 mg  2.5 mg IntraVENous Q6H PRN    warfarin placeholder: dosing by pharmacy   Other RX Placeholder     No current outpatient medications on file.        Lab Data Reviewed: (see below)  Lab Review:     Recent Labs     02/25/22  1600 02/26/22  0414   WBC 4.8 6.2   HGB 11.4* 11.1*   HCT 35.0* 33.9*   PLT  129* 137*     Recent Labs     02/25/22  1600 02/26/22  0414   NA 136 138   K 3.2* 3.9   CL 103 104   CO2 29 28   GLUCOSE 141* 92   BUN 23* 22*   CREATININE 1.90* 1.86*   CALCIUM 8.2* 7.9*   MG 1.8  2.0 1.8   LABALBU 3.0* 3.0*   AST 31 33   ALT 17 18     Lab Results   Component Value Date/Time    GLUCOSE 92 02/26/2022 04:14 AM    GLUCOSE 141 02/25/2022 04:00 PM    GLUCOSE 104 01/13/2022 01:35 PM    GLUCOSE 98 01/12/2022 02:15 AM    GLUCOSE 97 01/10/2022 09:02 PM     No results for input(s): PH, PCO2, PO2, HCO3, FIO2 in the last 72 hours.  Recent Labs     02/25/22  1600 02/26/22  0414   INR 2.6* 2.5*     Results       Procedure Component Value Units Date/Time    Culture, Urine [[id number]]     Order Status: Sent Specimen: Urine, clean catch             Other pertinent lab: none    Total time spent with patient: 30 Minutes I personally reviewed chart, notes, data and current medications in the medical record.  I have personally examined and treated the patient at bedside during this period.                  Care Plan discussed with: Patient, Care Manager, Nursing Staff, Consultant/Specialist, and >50% of time spent in counseling and coordination of care    Discussed:  Care Plan and D/C Planning    Prophylaxis:  Lovenox and H2B/PPI    Disposition:  HH PT, OT, RN           ___________________________________________________    Attending Physician: Esperanza Sheets, MD

## 2022-02-26 NOTE — ED Notes (Signed)
Provider notified about wounds on the pt's face     Martin[place] Oleta Gunnoe, RN  02/26/22 [phone]

## 2022-02-26 NOTE — Progress Notes (Signed)
Cardiology Update:     TTE w/ biventricular failure, EF 25-30%. Pt sees Dr. Melvyn Novas, has been on lasix and lisinopril at home. Discussed with pt's wife and daughter that we would recommend medical management only and cont his home therapy. Due to frequent falls, skin tears/lacerations would stop AC at this point until he can see his primary cardiologist. Pt ok for d/c since not having acute dyspnea/CP, appears comfortable and this is end stage disease. Will sign off.     02/25/22    TRANSTHORACIC ECHOCARDIOGRAM (TTE) COMPLETE (CONTRAST/BUBBLE/3D PRN) 02/26/2022  1:36 PM (Final)    Interpretation Summary    Left Ventricle: Severely reduced left ventricular systolic function with a visually estimated EF of 25 - 30%. Left ventricle size is normal. Moderately increased wall thickness. Severe global hypokinesis present. Grade II diastolic dysfunction with increased LAP.    Right Ventricle: Severely reduced systolic function.    Aortic Valve: Trileaflet valve. Mildly calcified cusp. Sclerosis of the aortic valve cusp. Mild stenosis of the aortic valve. AV mean gradient is 9 mmHg. AV area by continuity VTI is 1.7 cm2.    Mitral Valve: Mild regurgitation.    Right Atrium: Right atrium is severely dilated.    Signed by: Candyce Churn, DO on 02/26/2022  1:36 PM

## 2022-02-28 DIAGNOSIS — J90 Pleural effusion, not elsewhere classified: Secondary | ICD-10-CM | POA: Diagnosis not present

## 2022-02-28 DIAGNOSIS — E7800 Pure hypercholesterolemia, unspecified: Secondary | ICD-10-CM | POA: Diagnosis not present

## 2022-02-28 DIAGNOSIS — S2241XD Multiple fractures of ribs, right side, subsequent encounter for fracture with routine healing: Secondary | ICD-10-CM | POA: Diagnosis not present

## 2022-02-28 DIAGNOSIS — I480 Paroxysmal atrial fibrillation: Secondary | ICD-10-CM | POA: Diagnosis not present

## 2022-02-28 DIAGNOSIS — N179 Acute kidney failure, unspecified: Secondary | ICD-10-CM | POA: Diagnosis not present

## 2022-02-28 DIAGNOSIS — I10 Essential (primary) hypertension: Secondary | ICD-10-CM | POA: Diagnosis not present

## 2022-02-28 DIAGNOSIS — I110 Hypertensive heart disease with heart failure: Secondary | ICD-10-CM | POA: Diagnosis not present

## 2022-02-28 DIAGNOSIS — W010XXD Fall on same level from slipping, tripping and stumbling without subsequent striking against object, subsequent encounter: Secondary | ICD-10-CM | POA: Diagnosis not present

## 2022-02-28 DIAGNOSIS — R600 Localized edema: Secondary | ICD-10-CM | POA: Diagnosis not present

## 2022-02-28 DIAGNOSIS — M48061 Spinal stenosis, lumbar region without neurogenic claudication: Secondary | ICD-10-CM | POA: Diagnosis not present

## 2022-03-01 DIAGNOSIS — N179 Acute kidney failure, unspecified: Secondary | ICD-10-CM | POA: Diagnosis not present

## 2022-03-01 DIAGNOSIS — W010XXD Fall on same level from slipping, tripping and stumbling without subsequent striking against object, subsequent encounter: Secondary | ICD-10-CM | POA: Diagnosis not present

## 2022-03-01 DIAGNOSIS — I480 Paroxysmal atrial fibrillation: Secondary | ICD-10-CM | POA: Diagnosis not present

## 2022-03-01 DIAGNOSIS — E782 Mixed hyperlipidemia: Secondary | ICD-10-CM | POA: Diagnosis not present

## 2022-03-01 DIAGNOSIS — I509 Heart failure, unspecified: Secondary | ICD-10-CM | POA: Diagnosis not present

## 2022-03-01 DIAGNOSIS — J90 Pleural effusion, not elsewhere classified: Secondary | ICD-10-CM | POA: Diagnosis not present

## 2022-03-01 DIAGNOSIS — S2241XD Multiple fractures of ribs, right side, subsequent encounter for fracture with routine healing: Secondary | ICD-10-CM | POA: Diagnosis not present

## 2022-03-01 DIAGNOSIS — I110 Hypertensive heart disease with heart failure: Secondary | ICD-10-CM | POA: Diagnosis not present

## 2022-03-01 DIAGNOSIS — T148XXA Other injury of unspecified body region, initial encounter: Secondary | ICD-10-CM | POA: Diagnosis not present

## 2022-03-01 DIAGNOSIS — E039 Hypothyroidism, unspecified: Secondary | ICD-10-CM | POA: Diagnosis not present

## 2022-03-01 DIAGNOSIS — N189 Chronic kidney disease, unspecified: Secondary | ICD-10-CM | POA: Diagnosis not present

## 2022-03-01 DIAGNOSIS — M48061 Spinal stenosis, lumbar region without neurogenic claudication: Secondary | ICD-10-CM | POA: Diagnosis not present

## 2022-03-01 LAB — IRON AND TIBC
Iron % Saturation: 28 % (ref 20–50)
Iron: 86 ug/dL (ref 35–150)
TIBC: 307 ug/dL (ref 250–450)

## 2022-03-01 LAB — FERRITIN: Ferritin: 183 NG/ML (ref 26–388)

## 2022-03-01 LAB — T4: T4, Total: 8.8 ug/dL (ref 4.5–12.1)

## 2022-03-02 DIAGNOSIS — I110 Hypertensive heart disease with heart failure: Secondary | ICD-10-CM | POA: Diagnosis not present

## 2022-03-02 DIAGNOSIS — J90 Pleural effusion, not elsewhere classified: Secondary | ICD-10-CM | POA: Diagnosis not present

## 2022-03-02 DIAGNOSIS — S2241XD Multiple fractures of ribs, right side, subsequent encounter for fracture with routine healing: Secondary | ICD-10-CM | POA: Diagnosis not present

## 2022-03-02 DIAGNOSIS — N179 Acute kidney failure, unspecified: Secondary | ICD-10-CM | POA: Diagnosis not present

## 2022-03-02 DIAGNOSIS — W010XXD Fall on same level from slipping, tripping and stumbling without subsequent striking against object, subsequent encounter: Secondary | ICD-10-CM | POA: Diagnosis not present

## 2022-03-02 DIAGNOSIS — M48061 Spinal stenosis, lumbar region without neurogenic claudication: Secondary | ICD-10-CM | POA: Diagnosis not present

## 2022-03-05 DIAGNOSIS — N179 Acute kidney failure, unspecified: Secondary | ICD-10-CM | POA: Diagnosis not present

## 2022-03-05 DIAGNOSIS — M48061 Spinal stenosis, lumbar region without neurogenic claudication: Secondary | ICD-10-CM | POA: Diagnosis not present

## 2022-03-05 DIAGNOSIS — S2241XD Multiple fractures of ribs, right side, subsequent encounter for fracture with routine healing: Secondary | ICD-10-CM | POA: Diagnosis not present

## 2022-03-05 DIAGNOSIS — W010XXD Fall on same level from slipping, tripping and stumbling without subsequent striking against object, subsequent encounter: Secondary | ICD-10-CM | POA: Diagnosis not present

## 2022-03-05 DIAGNOSIS — I110 Hypertensive heart disease with heart failure: Secondary | ICD-10-CM | POA: Diagnosis not present

## 2022-03-05 DIAGNOSIS — J90 Pleural effusion, not elsewhere classified: Secondary | ICD-10-CM | POA: Diagnosis not present

## 2022-03-06 DIAGNOSIS — J90 Pleural effusion, not elsewhere classified: Secondary | ICD-10-CM | POA: Diagnosis not present

## 2022-03-06 DIAGNOSIS — M48061 Spinal stenosis, lumbar region without neurogenic claudication: Secondary | ICD-10-CM | POA: Diagnosis not present

## 2022-03-06 DIAGNOSIS — W010XXD Fall on same level from slipping, tripping and stumbling without subsequent striking against object, subsequent encounter: Secondary | ICD-10-CM | POA: Diagnosis not present

## 2022-03-06 DIAGNOSIS — N179 Acute kidney failure, unspecified: Secondary | ICD-10-CM | POA: Diagnosis not present

## 2022-03-06 DIAGNOSIS — I110 Hypertensive heart disease with heart failure: Secondary | ICD-10-CM | POA: Diagnosis not present

## 2022-03-06 DIAGNOSIS — S2241XD Multiple fractures of ribs, right side, subsequent encounter for fracture with routine healing: Secondary | ICD-10-CM | POA: Diagnosis not present

## 2022-03-07 DIAGNOSIS — W010XXD Fall on same level from slipping, tripping and stumbling without subsequent striking against object, subsequent encounter: Secondary | ICD-10-CM | POA: Diagnosis not present

## 2022-03-07 DIAGNOSIS — N179 Acute kidney failure, unspecified: Secondary | ICD-10-CM | POA: Diagnosis not present

## 2022-03-07 DIAGNOSIS — S2241XD Multiple fractures of ribs, right side, subsequent encounter for fracture with routine healing: Secondary | ICD-10-CM | POA: Diagnosis not present

## 2022-03-07 DIAGNOSIS — M48061 Spinal stenosis, lumbar region without neurogenic claudication: Secondary | ICD-10-CM | POA: Diagnosis not present

## 2022-03-07 DIAGNOSIS — J90 Pleural effusion, not elsewhere classified: Secondary | ICD-10-CM | POA: Diagnosis not present

## 2022-03-07 DIAGNOSIS — I110 Hypertensive heart disease with heart failure: Secondary | ICD-10-CM | POA: Diagnosis not present

## 2022-03-09 DIAGNOSIS — I110 Hypertensive heart disease with heart failure: Secondary | ICD-10-CM | POA: Diagnosis not present

## 2022-03-09 DIAGNOSIS — S2241XD Multiple fractures of ribs, right side, subsequent encounter for fracture with routine healing: Secondary | ICD-10-CM | POA: Diagnosis not present

## 2022-03-09 DIAGNOSIS — M48061 Spinal stenosis, lumbar region without neurogenic claudication: Secondary | ICD-10-CM | POA: Diagnosis not present

## 2022-03-09 DIAGNOSIS — W010XXD Fall on same level from slipping, tripping and stumbling without subsequent striking against object, subsequent encounter: Secondary | ICD-10-CM | POA: Diagnosis not present

## 2022-03-09 DIAGNOSIS — N179 Acute kidney failure, unspecified: Secondary | ICD-10-CM | POA: Diagnosis not present

## 2022-03-09 DIAGNOSIS — J90 Pleural effusion, not elsewhere classified: Secondary | ICD-10-CM | POA: Diagnosis not present

## 2022-03-13 DIAGNOSIS — W010XXD Fall on same level from slipping, tripping and stumbling without subsequent striking against object, subsequent encounter: Secondary | ICD-10-CM | POA: Diagnosis not present

## 2022-03-13 DIAGNOSIS — J90 Pleural effusion, not elsewhere classified: Secondary | ICD-10-CM | POA: Diagnosis not present

## 2022-03-13 DIAGNOSIS — M48061 Spinal stenosis, lumbar region without neurogenic claudication: Secondary | ICD-10-CM | POA: Diagnosis not present

## 2022-03-13 DIAGNOSIS — N179 Acute kidney failure, unspecified: Secondary | ICD-10-CM | POA: Diagnosis not present

## 2022-03-13 DIAGNOSIS — S2241XD Multiple fractures of ribs, right side, subsequent encounter for fracture with routine healing: Secondary | ICD-10-CM | POA: Diagnosis not present

## 2022-03-13 DIAGNOSIS — I110 Hypertensive heart disease with heart failure: Secondary | ICD-10-CM | POA: Diagnosis not present

## 2022-03-14 DIAGNOSIS — J90 Pleural effusion, not elsewhere classified: Secondary | ICD-10-CM | POA: Diagnosis not present

## 2022-03-14 DIAGNOSIS — I110 Hypertensive heart disease with heart failure: Secondary | ICD-10-CM | POA: Diagnosis not present

## 2022-03-14 DIAGNOSIS — M48061 Spinal stenosis, lumbar region without neurogenic claudication: Secondary | ICD-10-CM | POA: Diagnosis not present

## 2022-03-14 DIAGNOSIS — S2241XD Multiple fractures of ribs, right side, subsequent encounter for fracture with routine healing: Secondary | ICD-10-CM | POA: Diagnosis not present

## 2022-03-14 DIAGNOSIS — N179 Acute kidney failure, unspecified: Secondary | ICD-10-CM | POA: Diagnosis not present

## 2022-03-14 DIAGNOSIS — W010XXD Fall on same level from slipping, tripping and stumbling without subsequent striking against object, subsequent encounter: Secondary | ICD-10-CM | POA: Diagnosis not present

## 2022-03-15 DIAGNOSIS — N179 Acute kidney failure, unspecified: Secondary | ICD-10-CM | POA: Diagnosis not present

## 2022-03-15 DIAGNOSIS — J90 Pleural effusion, not elsewhere classified: Secondary | ICD-10-CM | POA: Diagnosis not present

## 2022-03-15 DIAGNOSIS — S2241XD Multiple fractures of ribs, right side, subsequent encounter for fracture with routine healing: Secondary | ICD-10-CM | POA: Diagnosis not present

## 2022-03-15 DIAGNOSIS — I110 Hypertensive heart disease with heart failure: Secondary | ICD-10-CM | POA: Diagnosis not present

## 2022-03-15 DIAGNOSIS — M48061 Spinal stenosis, lumbar region without neurogenic claudication: Secondary | ICD-10-CM | POA: Diagnosis not present

## 2022-03-15 DIAGNOSIS — W010XXD Fall on same level from slipping, tripping and stumbling without subsequent striking against object, subsequent encounter: Secondary | ICD-10-CM | POA: Diagnosis not present

## 2022-03-17 DIAGNOSIS — I130 Hypertensive heart and chronic kidney disease with heart failure and stage 1 through stage 4 chronic kidney disease, or unspecified chronic kidney disease: Secondary | ICD-10-CM | POA: Diagnosis not present

## 2022-03-17 DIAGNOSIS — R778 Other specified abnormalities of plasma proteins: Secondary | ICD-10-CM | POA: Diagnosis not present

## 2022-03-17 DIAGNOSIS — M48061 Spinal stenosis, lumbar region without neurogenic claudication: Secondary | ICD-10-CM | POA: Diagnosis not present

## 2022-03-17 DIAGNOSIS — I5022 Chronic systolic (congestive) heart failure: Secondary | ICD-10-CM | POA: Diagnosis not present

## 2022-03-17 DIAGNOSIS — E7800 Pure hypercholesterolemia, unspecified: Secondary | ICD-10-CM | POA: Diagnosis not present

## 2022-03-17 DIAGNOSIS — R296 Repeated falls: Secondary | ICD-10-CM | POA: Diagnosis not present

## 2022-03-17 DIAGNOSIS — I50810 Right heart failure, unspecified: Secondary | ICD-10-CM | POA: Diagnosis not present

## 2022-03-17 DIAGNOSIS — R4189 Other symptoms and signs involving cognitive functions and awareness: Secondary | ICD-10-CM | POA: Diagnosis not present

## 2022-03-17 DIAGNOSIS — D631 Anemia in chronic kidney disease: Secondary | ICD-10-CM | POA: Diagnosis not present

## 2022-03-17 DIAGNOSIS — E8809 Other disorders of plasma-protein metabolism, not elsewhere classified: Secondary | ICD-10-CM | POA: Diagnosis not present

## 2022-03-17 DIAGNOSIS — S32039D Unspecified fracture of third lumbar vertebra, subsequent encounter for fracture with routine healing: Secondary | ICD-10-CM | POA: Diagnosis not present

## 2022-03-17 DIAGNOSIS — G4700 Insomnia, unspecified: Secondary | ICD-10-CM | POA: Diagnosis not present

## 2022-03-17 DIAGNOSIS — G629 Polyneuropathy, unspecified: Secondary | ICD-10-CM | POA: Diagnosis not present

## 2022-03-17 DIAGNOSIS — I4891 Unspecified atrial fibrillation: Secondary | ICD-10-CM | POA: Diagnosis not present

## 2022-03-17 DIAGNOSIS — D696 Thrombocytopenia, unspecified: Secondary | ICD-10-CM | POA: Diagnosis not present

## 2022-03-17 DIAGNOSIS — R5383 Other fatigue: Secondary | ICD-10-CM | POA: Diagnosis not present

## 2022-03-17 DIAGNOSIS — E039 Hypothyroidism, unspecified: Secondary | ICD-10-CM | POA: Diagnosis not present

## 2022-03-17 DIAGNOSIS — I080 Rheumatic disorders of both mitral and aortic valves: Secondary | ICD-10-CM | POA: Diagnosis not present

## 2022-03-17 DIAGNOSIS — N183 Chronic kidney disease, stage 3 unspecified: Secondary | ICD-10-CM | POA: Diagnosis not present

## 2022-03-17 DIAGNOSIS — S2241XD Multiple fractures of ribs, right side, subsequent encounter for fracture with routine healing: Secondary | ICD-10-CM | POA: Diagnosis not present

## 2022-03-17 DIAGNOSIS — Z9181 History of falling: Secondary | ICD-10-CM | POA: Diagnosis not present

## 2022-03-17 DIAGNOSIS — Z955 Presence of coronary angioplasty implant and graft: Secondary | ICD-10-CM | POA: Diagnosis not present

## 2022-03-17 DIAGNOSIS — S32019D Unspecified fracture of first lumbar vertebra, subsequent encounter for fracture with routine healing: Secondary | ICD-10-CM | POA: Diagnosis not present

## 2022-03-19 DIAGNOSIS — I5022 Chronic systolic (congestive) heart failure: Secondary | ICD-10-CM | POA: Diagnosis not present

## 2022-03-19 DIAGNOSIS — N183 Chronic kidney disease, stage 3 unspecified: Secondary | ICD-10-CM | POA: Diagnosis not present

## 2022-03-19 DIAGNOSIS — S2241XD Multiple fractures of ribs, right side, subsequent encounter for fracture with routine healing: Secondary | ICD-10-CM | POA: Diagnosis not present

## 2022-03-19 DIAGNOSIS — I50810 Right heart failure, unspecified: Secondary | ICD-10-CM | POA: Diagnosis not present

## 2022-03-19 DIAGNOSIS — I130 Hypertensive heart and chronic kidney disease with heart failure and stage 1 through stage 4 chronic kidney disease, or unspecified chronic kidney disease: Secondary | ICD-10-CM | POA: Diagnosis not present

## 2022-03-19 DIAGNOSIS — G629 Polyneuropathy, unspecified: Secondary | ICD-10-CM | POA: Diagnosis not present

## 2022-03-21 DIAGNOSIS — N183 Chronic kidney disease, stage 3 unspecified: Secondary | ICD-10-CM | POA: Diagnosis not present

## 2022-03-21 DIAGNOSIS — I50810 Right heart failure, unspecified: Secondary | ICD-10-CM | POA: Diagnosis not present

## 2022-03-21 DIAGNOSIS — I130 Hypertensive heart and chronic kidney disease with heart failure and stage 1 through stage 4 chronic kidney disease, or unspecified chronic kidney disease: Secondary | ICD-10-CM | POA: Diagnosis not present

## 2022-03-21 DIAGNOSIS — S2241XD Multiple fractures of ribs, right side, subsequent encounter for fracture with routine healing: Secondary | ICD-10-CM | POA: Diagnosis not present

## 2022-03-21 DIAGNOSIS — I5022 Chronic systolic (congestive) heart failure: Secondary | ICD-10-CM | POA: Diagnosis not present

## 2022-03-21 DIAGNOSIS — G629 Polyneuropathy, unspecified: Secondary | ICD-10-CM | POA: Diagnosis not present

## 2022-03-23 DIAGNOSIS — I509 Heart failure, unspecified: Secondary | ICD-10-CM | POA: Diagnosis not present

## 2022-03-23 DIAGNOSIS — I2510 Atherosclerotic heart disease of native coronary artery without angina pectoris: Secondary | ICD-10-CM | POA: Diagnosis not present

## 2022-03-25 ENCOUNTER — Other Ambulatory Visit: Payer: Self-pay | Admitting: Family Medicine

## 2022-03-27 DIAGNOSIS — I130 Hypertensive heart and chronic kidney disease with heart failure and stage 1 through stage 4 chronic kidney disease, or unspecified chronic kidney disease: Secondary | ICD-10-CM | POA: Diagnosis not present

## 2022-03-27 DIAGNOSIS — I5022 Chronic systolic (congestive) heart failure: Secondary | ICD-10-CM | POA: Diagnosis not present

## 2022-03-27 DIAGNOSIS — S2241XD Multiple fractures of ribs, right side, subsequent encounter for fracture with routine healing: Secondary | ICD-10-CM | POA: Diagnosis not present

## 2022-03-27 DIAGNOSIS — I50810 Right heart failure, unspecified: Secondary | ICD-10-CM | POA: Diagnosis not present

## 2022-03-27 DIAGNOSIS — G629 Polyneuropathy, unspecified: Secondary | ICD-10-CM | POA: Diagnosis not present

## 2022-03-27 DIAGNOSIS — N183 Chronic kidney disease, stage 3 unspecified: Secondary | ICD-10-CM | POA: Diagnosis not present

## 2022-03-28 DIAGNOSIS — I5022 Chronic systolic (congestive) heart failure: Secondary | ICD-10-CM | POA: Diagnosis not present

## 2022-03-28 DIAGNOSIS — G629 Polyneuropathy, unspecified: Secondary | ICD-10-CM | POA: Diagnosis not present

## 2022-03-28 DIAGNOSIS — N183 Chronic kidney disease, stage 3 unspecified: Secondary | ICD-10-CM | POA: Diagnosis not present

## 2022-03-28 DIAGNOSIS — S2241XD Multiple fractures of ribs, right side, subsequent encounter for fracture with routine healing: Secondary | ICD-10-CM | POA: Diagnosis not present

## 2022-03-28 DIAGNOSIS — I130 Hypertensive heart and chronic kidney disease with heart failure and stage 1 through stage 4 chronic kidney disease, or unspecified chronic kidney disease: Secondary | ICD-10-CM | POA: Diagnosis not present

## 2022-03-28 DIAGNOSIS — I50810 Right heart failure, unspecified: Secondary | ICD-10-CM | POA: Diagnosis not present

## 2022-03-30 ENCOUNTER — Other Ambulatory Visit: Payer: Self-pay | Admitting: Family Medicine

## 2022-04-01 NOTE — Telephone Encounter (Signed)
I think they have moved.   Sent in one month refill.  Not seen here in > one year.

## 2022-04-03 ENCOUNTER — Inpatient Hospital Stay: Admit: 2022-04-03 | Payer: MEDICARE | Primary: Family Medicine

## 2022-04-03 ENCOUNTER — Emergency Department: Admit: 2022-04-03 | Payer: MEDICARE | Primary: Family Medicine

## 2022-04-03 ENCOUNTER — Inpatient Hospital Stay
Admit: 2022-04-03 | Discharge: 2022-04-03 | Disposition: A | Discharge status: Other Acute Inpatient Hospital | Payer: MEDICARE | Attending: Emergency Medicine

## 2022-04-03 DIAGNOSIS — I609 Nontraumatic subarachnoid hemorrhage, unspecified: Secondary | ICD-10-CM

## 2022-04-03 DIAGNOSIS — S066XAA Traumatic subarachnoid hemorrhage with loss of consciousness status unknown, initial encounter (HCC): Principal | ICD-10-CM

## 2022-04-03 DIAGNOSIS — S299XXA Unspecified injury of thorax, initial encounter: Secondary | ICD-10-CM | POA: Diagnosis not present

## 2022-04-03 DIAGNOSIS — E039 Hypothyroidism, unspecified: Secondary | ICD-10-CM | POA: Diagnosis not present

## 2022-04-03 DIAGNOSIS — S199XXA Unspecified injury of neck, initial encounter: Secondary | ICD-10-CM | POA: Diagnosis not present

## 2022-04-03 DIAGNOSIS — M25521 Pain in right elbow: Secondary | ICD-10-CM | POA: Diagnosis not present

## 2022-04-03 DIAGNOSIS — S40022A Contusion of left upper arm, initial encounter: Secondary | ICD-10-CM | POA: Diagnosis not present

## 2022-04-03 DIAGNOSIS — I608 Other nontraumatic subarachnoid hemorrhage: Secondary | ICD-10-CM | POA: Diagnosis not present

## 2022-04-03 DIAGNOSIS — I6523 Occlusion and stenosis of bilateral carotid arteries: Secondary | ICD-10-CM | POA: Diagnosis not present

## 2022-04-03 DIAGNOSIS — R809 Proteinuria, unspecified: Secondary | ICD-10-CM | POA: Diagnosis not present

## 2022-04-03 DIAGNOSIS — S79911A Unspecified injury of right hip, initial encounter: Secondary | ICD-10-CM | POA: Diagnosis not present

## 2022-04-03 DIAGNOSIS — Z043 Encounter for examination and observation following other accident: Secondary | ICD-10-CM | POA: Diagnosis not present

## 2022-04-03 DIAGNOSIS — I50810 Right heart failure, unspecified: Secondary | ICD-10-CM | POA: Diagnosis not present

## 2022-04-03 DIAGNOSIS — G629 Polyneuropathy, unspecified: Secondary | ICD-10-CM | POA: Diagnosis not present

## 2022-04-03 DIAGNOSIS — S0990XA Unspecified injury of head, initial encounter: Secondary | ICD-10-CM | POA: Diagnosis not present

## 2022-04-03 DIAGNOSIS — M25561 Pain in right knee: Secondary | ICD-10-CM | POA: Diagnosis not present

## 2022-04-03 DIAGNOSIS — S3991XA Unspecified injury of abdomen, initial encounter: Secondary | ICD-10-CM | POA: Diagnosis not present

## 2022-04-03 DIAGNOSIS — S066X0A Traumatic subarachnoid hemorrhage without loss of consciousness, initial encounter: Secondary | ICD-10-CM | POA: Diagnosis not present

## 2022-04-03 DIAGNOSIS — M25551 Pain in right hip: Secondary | ICD-10-CM | POA: Diagnosis not present

## 2022-04-03 DIAGNOSIS — I21A1 Myocardial infarction type 2: Secondary | ICD-10-CM | POA: Diagnosis not present

## 2022-04-03 DIAGNOSIS — I5022 Chronic systolic (congestive) heart failure: Secondary | ICD-10-CM | POA: Diagnosis not present

## 2022-04-03 DIAGNOSIS — S066X9A Traumatic subarachnoid hemorrhage with loss of consciousness of unspecified duration, initial encounter: Secondary | ICD-10-CM | POA: Diagnosis not present

## 2022-04-03 DIAGNOSIS — I6521 Occlusion and stenosis of right carotid artery: Secondary | ICD-10-CM | POA: Diagnosis not present

## 2022-04-03 DIAGNOSIS — R9431 Abnormal electrocardiogram [ECG] [EKG]: Secondary | ICD-10-CM | POA: Diagnosis not present

## 2022-04-03 DIAGNOSIS — I130 Hypertensive heart and chronic kidney disease with heart failure and stage 1 through stage 4 chronic kidney disease, or unspecified chronic kidney disease: Secondary | ICD-10-CM | POA: Diagnosis not present

## 2022-04-03 DIAGNOSIS — R402412 Glasgow coma scale score 13-15, at arrival to emergency department: Secondary | ICD-10-CM | POA: Diagnosis not present

## 2022-04-03 DIAGNOSIS — R7989 Other specified abnormal findings of blood chemistry: Secondary | ICD-10-CM | POA: Diagnosis not present

## 2022-04-03 DIAGNOSIS — D696 Thrombocytopenia, unspecified: Secondary | ICD-10-CM | POA: Diagnosis not present

## 2022-04-03 DIAGNOSIS — N183 Chronic kidney disease, stage 3 unspecified: Secondary | ICD-10-CM | POA: Diagnosis not present

## 2022-04-03 DIAGNOSIS — I4811 Longstanding persistent atrial fibrillation: Secondary | ICD-10-CM | POA: Diagnosis not present

## 2022-04-03 DIAGNOSIS — N139 Obstructive and reflux uropathy, unspecified: Secondary | ICD-10-CM | POA: Diagnosis not present

## 2022-04-03 DIAGNOSIS — S40021A Contusion of right upper arm, initial encounter: Secondary | ICD-10-CM | POA: Diagnosis not present

## 2022-04-03 DIAGNOSIS — I252 Old myocardial infarction: Secondary | ICD-10-CM | POA: Diagnosis not present

## 2022-04-03 DIAGNOSIS — E7800 Pure hypercholesterolemia, unspecified: Secondary | ICD-10-CM | POA: Diagnosis not present

## 2022-04-03 DIAGNOSIS — W1830XA Fall on same level, unspecified, initial encounter: Secondary | ICD-10-CM | POA: Diagnosis not present

## 2022-04-03 DIAGNOSIS — Z23 Encounter for immunization: Secondary | ICD-10-CM | POA: Diagnosis not present

## 2022-04-03 DIAGNOSIS — Z7901 Long term (current) use of anticoagulants: Secondary | ICD-10-CM | POA: Diagnosis not present

## 2022-04-03 DIAGNOSIS — S8001XA Contusion of right knee, initial encounter: Secondary | ICD-10-CM | POA: Diagnosis not present

## 2022-04-03 DIAGNOSIS — R600 Localized edema: Secondary | ICD-10-CM | POA: Diagnosis not present

## 2022-04-03 DIAGNOSIS — S8991XA Unspecified injury of right lower leg, initial encounter: Secondary | ICD-10-CM | POA: Diagnosis not present

## 2022-04-03 DIAGNOSIS — I4891 Unspecified atrial fibrillation: Secondary | ICD-10-CM | POA: Diagnosis not present

## 2022-04-03 DIAGNOSIS — I493 Ventricular premature depolarization: Secondary | ICD-10-CM | POA: Diagnosis not present

## 2022-04-03 DIAGNOSIS — R519 Headache, unspecified: Secondary | ICD-10-CM | POA: Diagnosis not present

## 2022-04-03 DIAGNOSIS — I5082 Biventricular heart failure: Secondary | ICD-10-CM | POA: Diagnosis present

## 2022-04-03 DIAGNOSIS — G8911 Acute pain due to trauma: Secondary | ICD-10-CM | POA: Diagnosis not present

## 2022-04-03 DIAGNOSIS — E876 Hypokalemia: Secondary | ICD-10-CM | POA: Diagnosis not present

## 2022-04-03 DIAGNOSIS — Z79899 Other long term (current) drug therapy: Secondary | ICD-10-CM | POA: Diagnosis not present

## 2022-04-03 DIAGNOSIS — S2241XD Multiple fractures of ribs, right side, subsequent encounter for fracture with routine healing: Secondary | ICD-10-CM | POA: Diagnosis not present

## 2022-04-03 DIAGNOSIS — S0292XA Unspecified fracture of facial bones, initial encounter for closed fracture: Secondary | ICD-10-CM | POA: Diagnosis not present

## 2022-04-03 DIAGNOSIS — Z79890 Hormone replacement therapy (postmenopausal): Secondary | ICD-10-CM | POA: Diagnosis not present

## 2022-04-03 DIAGNOSIS — I214 Non-ST elevation (NSTEMI) myocardial infarction: Secondary | ICD-10-CM | POA: Diagnosis not present

## 2022-04-03 LAB — CBC WITH AUTO DIFFERENTIAL
Absolute Immature Granulocyte: 0 10*3/uL (ref 0.00–0.04)
Basophils %: 1 % (ref 0–1)
Basophils Absolute: 0.1 10*3/uL (ref 0.0–0.1)
Eosinophils %: 0 % (ref 0–7)
Eosinophils Absolute: 0 10*3/uL (ref 0.0–0.4)
Hematocrit: 31.7 % — ABNORMAL LOW (ref 36.6–50.3)
Hemoglobin: 10.4 g/dL — ABNORMAL LOW (ref 12.1–17.0)
Immature Granulocytes: 0 % (ref 0.0–0.5)
Lymphocytes %: 20 % (ref 12–49)
Lymphocytes Absolute: 0.9 10*3/uL (ref 0.8–3.5)
MCH: 30.7 PG (ref 26.0–34.0)
MCHC: 32.8 g/dL (ref 30.0–36.5)
MCV: 93.5 FL (ref 80.0–99.0)
MPV: 10.8 FL (ref 8.9–12.9)
Monocytes %: 12 % (ref 5–13)
Monocytes Absolute: 0.5 10*3/uL (ref 0.0–1.0)
Neutrophils %: 67 % (ref 32–75)
Neutrophils Absolute: 3 10*3/uL (ref 1.8–8.0)
Nucleated RBCs: 0 PER 100 WBC
Platelets: 161 10*3/uL (ref 150–400)
RBC: 3.39 M/uL — ABNORMAL LOW (ref 4.10–5.70)
RDW: 18.4 % — ABNORMAL HIGH (ref 11.5–14.5)
WBC: 4.5 10*3/uL (ref 4.1–11.1)
nRBC: 0 10*3/uL (ref 0.00–0.01)

## 2022-04-03 LAB — TROPONIN: Troponin, High Sensitivity: 126 ng/L (ref 0–76)

## 2022-04-03 LAB — COMPREHENSIVE METABOLIC PANEL
ALT: 15 U/L (ref 12–78)
AST: 56 U/L — ABNORMAL HIGH (ref 15–37)
Albumin/Globulin Ratio: 0.7 — ABNORMAL LOW (ref 1.1–2.2)
Albumin: 2.7 g/dL — ABNORMAL LOW (ref 3.5–5.0)
Alk Phosphatase: 79 U/L (ref 45–117)
Anion Gap: 6 mmol/L (ref 5–15)
BUN: 23 MG/DL — ABNORMAL HIGH (ref 6–20)
Bun/Cre Ratio: 12 (ref 12–20)
CO2: 26 mmol/L (ref 21–32)
Calcium: 8.1 MG/DL — ABNORMAL LOW (ref 8.5–10.1)
Chloride: 104 mmol/L (ref 97–108)
Creatinine: 1.95 MG/DL — ABNORMAL HIGH (ref 0.70–1.30)
Est, Glom Filt Rate: 32 mL/min/{1.73_m2} — ABNORMAL LOW (ref >60)
Globulin: 3.9 g/dL (ref 2.0–4.0)
Glucose: 113 mg/dL — ABNORMAL HIGH (ref 65–100)
Potassium: 4 mmol/L (ref 3.5–5.1)
Sodium: 136 mmol/L (ref 136–145)
Total Bilirubin: 1.2 MG/DL — ABNORMAL HIGH (ref 0.2–1.0)
Total Protein: 6.6 g/dL (ref 6.4–8.2)

## 2022-04-03 LAB — PROTIME-INR
INR: 2.3 — ABNORMAL HIGH (ref 0.9–1.1)
Protime: 22.7 s — ABNORMAL HIGH (ref 9.0–11.1)

## 2022-04-03 MED ORDER — SODIUM CHLORIDE 0.9 % IV SOLN
0.9 % | Freq: Once | INTRAVENOUS | Status: DC
Start: 2022-04-03 — End: 2022-04-03

## 2022-04-03 MED ORDER — SODIUM CHLORIDE 0.9 % IV BOLUS
0.9 % | Freq: Once | INTRAVENOUS | Status: DC
Start: 2022-04-03 — End: 2022-04-03

## 2022-04-03 MED ORDER — PROTHROMBIN COMPLEX CONC HUMAN 500 UNITS IV KIT
500 units | Freq: Once | INTRAVENOUS | Status: DC
Start: 2022-04-03 — End: 2022-04-03

## 2022-04-03 MED FILL — PHYTONADIONE 10 MG/ML IJ SOLN: 10 MG/ML | INTRAMUSCULAR | Qty: 1

## 2022-04-03 MED FILL — SODIUM CHLORIDE 0.9 % IV SOLN: 0.9 % | INTRAVENOUS | Qty: 250

## 2022-04-03 NOTE — ED Provider Notes (Signed)
[facility] EMERGENCY DEPT  EMERGENCY DEPARTMENT ENCOUNTER      Pt Name: Mason Fields  MRN: [id number]  Birthdate [birth year]  Date of evaluation: 04/03/2022  Provider: Ellsworth Lennox, DO    CHIEF COMPLAINT       Chief Complaint   Patient presents with    Fall    Headache           Facial Pain    Arm Pain           Knee Pain                HISTORY OF PRESENT ILLNESS    HPI    Mason Fields is a 88 y.o. male with a history of atrial fibrillation on coumadin, HFrEF (EF 25-30%), HLD, hypothyroid who presents to the emergency department for evaluation of a fall.  Patient reports yesterday morning he fell in the bathroom, he is unsure what made him fall.  He believes he may have passed out.  Complains of headache, right elbow, hip and knee pain. Denies any chest pain, shortness of breath, lightheadedness or dizziness. Denies any recent illness including, fever, cough, nausea, vomiting or diarrhea.     Nursing Notes were reviewed.    REVIEW OF SYSTEMS       Review of Systems   Constitutional:  Negative for chills and fever.   HENT:  Negative for congestion and rhinorrhea.    Eyes:  Negative for discharge and redness.   Respiratory:  Negative for cough and shortness of breath.    Cardiovascular:  Positive for leg swelling. Negative for chest pain.   Gastrointestinal:  Negative for abdominal pain, diarrhea, nausea and vomiting.   Musculoskeletal:  Positive for arthralgias. Negative for back pain and neck pain.   Neurological:  Positive for headaches. Negative for speech difficulty.   Psychiatric/Behavioral:  Negative for agitation.          PAST MEDICAL HISTORY     Past Medical History:   Diagnosis Date    A-fib (HCC)     CHF (congestive heart failure), NYHA class II, chronic, systolic (HCC)     EF 25%    Chronic anticoagulation     CKD (chronic kidney disease), stage III (HCC)     Hypercholesteremia     Hypothyroid     Right atrial dilation     Right heart failure (HCC)          SURGICAL HISTORY       Past Surgical  History:   Procedure Laterality Date    CORONARY ANGIOPLASTY WITH STENT PLACEMENT      10 years ago         CURRENT MEDICATIONS       Previous Medications    No medications on file       ALLERGIES     Patient has no known allergies.    FAMILY HISTORY     No family history on file.       SOCIAL HISTORY       Social History     Socioeconomic History    Marital status: Married   Tobacco Use    Smoking status: Never    Smokeless tobacco: Never   Substance and Sexual Activity    Drug use: Never           PHYSICAL EXAM       ED Triage Vitals [04/03/22 1522]   BP Temp Temp Source Pulse Respirations SpO2 Height Weight - Scale   Marland Kitchen)  88/47 98.1 F (36.7 C) Oral 93 20 97 % 5\' 7"  (1.702 m) 181 lb (82.1 kg)       Body mass index is 28.35 kg/m.    Physical Exam  Vitals and nursing note reviewed.   Constitutional:       General: He is in acute distress.      Appearance: He is not toxic-appearing.      Comments: Chronically ill appearing    HENT:      Head: No raccoon eyes or Battle's sign.      Comments: +right eye periorbital ecchymosis, right lower face with abrasion vs laceration to the jaw not actively bleeding   Eyes:      General: No scleral icterus.        Right eye: No discharge.         Left eye: No discharge.      Conjunctiva/sclera: Conjunctivae normal.   Cardiovascular:      Rate and Rhythm: Normal rate.      Pulses: Normal pulses.   Pulmonary:      Effort: Pulmonary effort is normal. No respiratory distress.   Abdominal:      Tenderness: There is no abdominal tenderness. There is no guarding or rebound.   Musculoskeletal:      Cervical back: Normal range of motion. No tenderness.      Right knee: Effusion, ecchymosis and bony tenderness present. Decreased range of motion. Normal pulse.      Right lower leg: Edema present.      Left lower leg: Edema present.   Skin:     General: Skin is warm and dry.      Capillary Refill: Capillary refill takes less than 2 seconds.      Comments: Scattered bruising throughout arms,  right knee    Neurological:      General: No focal deficit present.      Mental Status: He is alert.   Psychiatric:         Mood and Affect: Mood normal.         Behavior: Behavior normal.       DIAGNOSTIC RESULTS     EKG: All EKG's are interpreted by the Emergency Department Physician who either signs or Co-signs this chart in the absence of a cardiologist.    ED Course as of 04/03/22 1653   Tue Apr 03, 2022   1651 EKG 12 Lead  ECG at 1639, interpreted by me: A fib rate 103 bpm. Occasional PVCs. LAD. Low voltage QRS. No ST elevations [SH]      ED Course User Index  [SH] Apr 05, 2022, DO       RADIOLOGY:   Non-plain film images such as CT, Ultrasound and MRI are read by the radiologist. Plain radiographic images are visualized and preliminarily interpreted by the emergency physician with the below findings:        Interpretation per the Radiologist below, if available at the time of this note:    CT Head W/O Contrast   Final Result   Subarachnoid hemorrhage.      Report called.      CT MAXILLOFACIAL WO CONTRAST   Final Result   No Maxillofacial Fracture.          XR KNEE RIGHT (3 VIEWS)    (Results Pending)   XR ELBOW RIGHT (2 VIEWS)    (Results Pending)   XR HIP 2-3 VW W PELVIS RIGHT    (Results Pending)   XR CHEST  PORTABLE    (Results Pending)        LABS:  Labs Reviewed   CBC WITH AUTO DIFFERENTIAL - Abnormal; Notable for the following components:       Result Value    RBC 3.39 (*)     Hemoglobin 10.4 (*)     Hematocrit 31.7 (*)     RDW 18.4 (*)     All other components within normal limits   COMPREHENSIVE METABOLIC PANEL - Abnormal; Notable for the following components:    Glucose 113 (*)     BUN 23 (*)     Creatinine 1.95 (*)     Est, Glom Filt Rate 32 (*)     Calcium 8.1 (*)     Total Bilirubin 1.2 (*)     AST 56 (*)     Albumin 2.7 (*)     Albumin/Globulin Ratio 0.7 (*)     All other components within normal limits   PROTIME-INR - Abnormal; Notable for the following components:    INR 2.3 (*)      Protime 22.7 (*)     All other components within normal limits   URINALYSIS WITH MICROSCOPIC   TROPONIN       All other labs were within normal range or not returned as of this dictation.    EMERGENCY DEPARTMENT COURSE and DIFFERENTIAL DIAGNOSIS/MDM:   Vitals:    Vitals:    04/03/22 1545 04/03/22 1557 04/03/22 1600 04/03/22 1615   BP: 124/61  (!) 113/55 (!) 124/91   Pulse:  98 76 95   Resp:   15 16   Temp:       TempSrc:       SpO2:   100% 100%   Weight:       Height:               Medical Decision Making      DECISION MAKING:  Tiernan Suto is a 88 y.o. male who comes in as above.  Blood pressure in triage is 88/47, patient was subsequently taken back to her room did normalize to 124/61. On exam, he has right periorbital ecchymosis, there is scattered bruising throughout the bilateral upper lower extremities but the right knee has significant effusion and ecchymosis.  He is able to range somewhat but has pain.  He is awake, alert and oriented x4.  Otherwise neurologically intact.  Differential diagnosis includes, but not limited to, intracranial hemorrhage, closed head injury, concussion, facial fracture, elbow contusion or fracture, knee fracture, knee effusion, knee contusion.    I reviewed CT imaging at time of CT report, there bilateral frontal lobe subarachnoid left greater than right without mass effect or shift.  Page out to transfer center for transfer to trauma facility.    Subsequently spoke with Dr. Melvyn Neth at [facility] for transfer, patient accepted for transfer to their facility.    Labs reviewed, notable for anemia with a hemoglobin of 10.4.  BUN and creatinine elevated at 23 and 1.95, respectively this is consistent with his baseline kidney function.  INR is 2.3. Spoke with teleneurology regarding patient's case, recommend reversal with [facility] and Vitamin K. Both were ordered, but transport arrived shortly afterwards and did not want to delay transfer therefore these were not given prior to transport.      Amount and/or Complexity of Data Reviewed  Labs: ordered. Decision-making details documented in ED Course.  Radiology: ordered. Decision-making details documented in ED Course.  ECG/medicine tests: ordered. Decision-making details documented  in ED Course.    Risk  Prescription drug management.        CONSULTS:  None    REASSESSMENT     16:30  Patient and family notified of results and plan for transfer, all questions answered.  Patient remains neurologically intact.  Blood pressure 115/98.    Total critical care time (not including time spent performing separately reportable procedures): 45 minutes     PROCEDURES:  Unless otherwise noted below, none     Procedures      FINAL IMPRESSION      1. SAH (subarachnoid hemorrhage) (HCC)    2. Contusion of right knee, initial encounter          DISPOSITION/PLAN   DISPOSITION Decision To Transfer 04/03/2022 04:23:53 PM  (Please note that portions of this note were completed with a voice recognition program.  Efforts were made to edit the dictations but occasionally words are mis-transcribed.)    Ellsworth Lennox, DO (electronically signed)  Emergency Attending Physician / Physician Assistant / Nurse Practitioner         Ellsworth Lennox, DO  04/03/22 1721

## 2022-04-03 NOTE — ED Notes (Signed)
TRANSFER - OUT REPORT:    Verbal report given to Kristen Loader on Mason Fields  being transferred to [facility] ER for urgent transfer       Report consisted of patient's Situation, Background, Assessment and   Recommendations(SBAR).     Information from the following report(s) Nurse Handoff Report, ED Encounter Summary, ED SBAR, Adult Overview, MAR, Recent Results, Cardiac Rhythm Afib, and Neuro Assessment was reviewed with the receiving nurse.    Kinder Fall Assessment:                           Lines:       Opportunity for questions and clarification was provided.      Patient transported with:  Monitor and Registered Nurse  [place], RN  04/03/22 [phone]

## 2022-04-03 NOTE — ED Triage Notes (Addendum)
Patient arrives in wheelchair with c/o head pain, facial pain, right elbow pain, right knee pain following GLF.    Patient states does not know what caused the fall. Fall was unwitnessed.    Neighbor states fell occurred at 830 yesterday morning. Patient states that that he fell in bathroom.     Reports LOC. States takes coumadin.    Dr. Sandi Mariscal in triage assessing patient.     Patient is A&O x 4. Presents with diffuse ecchymosis on extremities and face.

## 2022-04-04 DIAGNOSIS — I609 Nontraumatic subarachnoid hemorrhage, unspecified: Secondary | ICD-10-CM | POA: Diagnosis not present

## 2022-04-04 LAB — EKG 12-LEAD
Q-T Interval: 388 ms
QRS Duration: 98 ms
QTc Calculation (Bazett): 508 ms
R Axis: -37 degrees
T Axis: 166 degrees
Ventricular Rate: 103 {beats}/min

## 2022-04-05 DIAGNOSIS — R809 Proteinuria, unspecified: Secondary | ICD-10-CM | POA: Diagnosis not present

## 2022-04-06 DIAGNOSIS — I608 Other nontraumatic subarachnoid hemorrhage: Secondary | ICD-10-CM | POA: Diagnosis not present

## 2022-04-08 DIAGNOSIS — I50810 Right heart failure, unspecified: Secondary | ICD-10-CM | POA: Diagnosis not present

## 2022-04-08 DIAGNOSIS — I130 Hypertensive heart and chronic kidney disease with heart failure and stage 1 through stage 4 chronic kidney disease, or unspecified chronic kidney disease: Secondary | ICD-10-CM | POA: Diagnosis not present

## 2022-04-08 DIAGNOSIS — N183 Chronic kidney disease, stage 3 unspecified: Secondary | ICD-10-CM | POA: Diagnosis not present

## 2022-04-08 DIAGNOSIS — S2241XD Multiple fractures of ribs, right side, subsequent encounter for fracture with routine healing: Secondary | ICD-10-CM | POA: Diagnosis not present

## 2022-04-08 DIAGNOSIS — G629 Polyneuropathy, unspecified: Secondary | ICD-10-CM | POA: Diagnosis not present

## 2022-04-08 DIAGNOSIS — I5022 Chronic systolic (congestive) heart failure: Secondary | ICD-10-CM | POA: Diagnosis not present

## 2022-04-11 NOTE — Progress Notes (Signed)
Formatting of this note might be different from the original.  RPM - Declined    Placed call to patient's phone, unable to reach. Was able to reach spouse, Khalil Szczepanik who said patient was resting. Provided an overview of RPM. Spouse says that they would like to decline RPM at this time. Spouse says that patient's daughter indicated that they would decline RPM and that they would call if there were further questions about RPM.  Electronically signed by Rayne Du, RN Care Coordinator at 04/11/2022  4:12 PM EDT

## 2022-04-11 NOTE — Progress Notes (Signed)
Formatting of this note might be different from the original.  Reconsidering RPM    Mason Fields called because she and pt's dtr discussed program offered and decided this might be helpful. Wife thought program included visiting nurses. Pt does have HH ordered and would like to further discuss which services will do what.     Mason Fields would like a call back tomorrow to discuss her options further.  Electronically signed by Vedia Coffer, RN at 04/11/2022  5:47 PM EDT

## 2022-04-11 NOTE — Progress Notes (Signed)
Formatting of this note might be different from the original.  Placed call back to patient's spouse Hermes Wafer. Spouse says that they are having a Hospice Nurse come by to see patient and do not think RPM would be appropriate anymore.    Will keep referral closed.  Electronically signed by Rayne Du, RN Care Coordinator at 04/13/2022 10:30 AM EDT

## 2022-04-13 DIAGNOSIS — I5022 Chronic systolic (congestive) heart failure: Secondary | ICD-10-CM | POA: Diagnosis not present

## 2022-04-13 DIAGNOSIS — E039 Hypothyroidism, unspecified: Secondary | ICD-10-CM | POA: Diagnosis not present

## 2022-04-13 DIAGNOSIS — S32019D Unspecified fracture of first lumbar vertebra, subsequent encounter for fracture with routine healing: Secondary | ICD-10-CM | POA: Diagnosis not present

## 2022-04-13 DIAGNOSIS — Z515 Encounter for palliative care: Secondary | ICD-10-CM | POA: Diagnosis not present

## 2022-04-13 DIAGNOSIS — S2241XD Multiple fractures of ribs, right side, subsequent encounter for fracture with routine healing: Secondary | ICD-10-CM | POA: Diagnosis not present

## 2022-04-13 DIAGNOSIS — S066X0D Traumatic subarachnoid hemorrhage without loss of consciousness, subsequent encounter: Secondary | ICD-10-CM | POA: Diagnosis not present

## 2022-04-13 DIAGNOSIS — I50810 Right heart failure, unspecified: Secondary | ICD-10-CM | POA: Diagnosis not present

## 2022-04-13 DIAGNOSIS — Z9181 History of falling: Secondary | ICD-10-CM | POA: Diagnosis not present

## 2022-04-13 DIAGNOSIS — D696 Thrombocytopenia, unspecified: Secondary | ICD-10-CM | POA: Diagnosis not present

## 2022-04-13 DIAGNOSIS — S0292XD Unspecified fracture of facial bones, subsequent encounter for fracture with routine healing: Secondary | ICD-10-CM | POA: Diagnosis not present

## 2022-04-13 DIAGNOSIS — R4189 Other symptoms and signs involving cognitive functions and awareness: Secondary | ICD-10-CM | POA: Diagnosis not present

## 2022-04-13 DIAGNOSIS — I080 Rheumatic disorders of both mitral and aortic valves: Secondary | ICD-10-CM | POA: Diagnosis not present

## 2022-04-13 DIAGNOSIS — M48061 Spinal stenosis, lumbar region without neurogenic claudication: Secondary | ICD-10-CM | POA: Diagnosis not present

## 2022-04-13 DIAGNOSIS — N183 Chronic kidney disease, stage 3 unspecified: Secondary | ICD-10-CM | POA: Diagnosis not present

## 2022-04-14 DIAGNOSIS — N183 Chronic kidney disease, stage 3 unspecified: Secondary | ICD-10-CM | POA: Diagnosis not present

## 2022-04-14 DIAGNOSIS — I50810 Right heart failure, unspecified: Secondary | ICD-10-CM | POA: Diagnosis not present

## 2022-04-14 DIAGNOSIS — I5022 Chronic systolic (congestive) heart failure: Secondary | ICD-10-CM | POA: Diagnosis not present

## 2022-04-14 DIAGNOSIS — I080 Rheumatic disorders of both mitral and aortic valves: Secondary | ICD-10-CM | POA: Diagnosis not present

## 2022-04-14 DIAGNOSIS — S066X0D Traumatic subarachnoid hemorrhage without loss of consciousness, subsequent encounter: Secondary | ICD-10-CM | POA: Diagnosis not present

## 2022-04-14 DIAGNOSIS — Z9181 History of falling: Secondary | ICD-10-CM | POA: Diagnosis not present

## 2022-04-16 DIAGNOSIS — I50810 Right heart failure, unspecified: Secondary | ICD-10-CM | POA: Diagnosis not present

## 2022-04-16 DIAGNOSIS — I080 Rheumatic disorders of both mitral and aortic valves: Secondary | ICD-10-CM | POA: Diagnosis not present

## 2022-04-16 DIAGNOSIS — S066X0D Traumatic subarachnoid hemorrhage without loss of consciousness, subsequent encounter: Secondary | ICD-10-CM | POA: Diagnosis not present

## 2022-04-16 DIAGNOSIS — I5022 Chronic systolic (congestive) heart failure: Secondary | ICD-10-CM | POA: Diagnosis not present

## 2022-04-16 DIAGNOSIS — N183 Chronic kidney disease, stage 3 unspecified: Secondary | ICD-10-CM | POA: Diagnosis not present

## 2022-04-16 DIAGNOSIS — Z9181 History of falling: Secondary | ICD-10-CM | POA: Diagnosis not present

## 2022-04-17 DIAGNOSIS — S2241XD Multiple fractures of ribs, right side, subsequent encounter for fracture with routine healing: Secondary | ICD-10-CM | POA: Diagnosis not present

## 2022-04-17 DIAGNOSIS — M48061 Spinal stenosis, lumbar region without neurogenic claudication: Secondary | ICD-10-CM | POA: Diagnosis not present

## 2022-04-17 DIAGNOSIS — D696 Thrombocytopenia, unspecified: Secondary | ICD-10-CM | POA: Diagnosis not present

## 2022-04-17 DIAGNOSIS — I50810 Right heart failure, unspecified: Secondary | ICD-10-CM | POA: Diagnosis not present

## 2022-04-17 DIAGNOSIS — S066X0D Traumatic subarachnoid hemorrhage without loss of consciousness, subsequent encounter: Secondary | ICD-10-CM | POA: Diagnosis not present

## 2022-04-17 DIAGNOSIS — R4189 Other symptoms and signs involving cognitive functions and awareness: Secondary | ICD-10-CM | POA: Diagnosis not present

## 2022-04-17 DIAGNOSIS — I5022 Chronic systolic (congestive) heart failure: Secondary | ICD-10-CM | POA: Diagnosis not present

## 2022-04-17 DIAGNOSIS — I080 Rheumatic disorders of both mitral and aortic valves: Secondary | ICD-10-CM | POA: Diagnosis not present

## 2022-04-17 DIAGNOSIS — E039 Hypothyroidism, unspecified: Secondary | ICD-10-CM | POA: Diagnosis not present

## 2022-04-17 DIAGNOSIS — S32019D Unspecified fracture of first lumbar vertebra, subsequent encounter for fracture with routine healing: Secondary | ICD-10-CM | POA: Diagnosis not present

## 2022-04-17 DIAGNOSIS — Z515 Encounter for palliative care: Secondary | ICD-10-CM | POA: Diagnosis not present

## 2022-04-17 DIAGNOSIS — Z9181 History of falling: Secondary | ICD-10-CM | POA: Diagnosis not present

## 2022-04-17 DIAGNOSIS — S0292XD Unspecified fracture of facial bones, subsequent encounter for fracture with routine healing: Secondary | ICD-10-CM | POA: Diagnosis not present

## 2022-04-17 DIAGNOSIS — N183 Chronic kidney disease, stage 3 unspecified: Secondary | ICD-10-CM | POA: Diagnosis not present

## 2022-04-19 DIAGNOSIS — I080 Rheumatic disorders of both mitral and aortic valves: Secondary | ICD-10-CM | POA: Diagnosis not present

## 2022-04-19 DIAGNOSIS — I5022 Chronic systolic (congestive) heart failure: Secondary | ICD-10-CM | POA: Diagnosis not present

## 2022-04-19 DIAGNOSIS — S066X0D Traumatic subarachnoid hemorrhage without loss of consciousness, subsequent encounter: Secondary | ICD-10-CM | POA: Diagnosis not present

## 2022-04-19 DIAGNOSIS — Z9181 History of falling: Secondary | ICD-10-CM | POA: Diagnosis not present

## 2022-04-19 DIAGNOSIS — I50810 Right heart failure, unspecified: Secondary | ICD-10-CM | POA: Diagnosis not present

## 2022-04-19 DIAGNOSIS — N183 Chronic kidney disease, stage 3 unspecified: Secondary | ICD-10-CM | POA: Diagnosis not present

## 2022-04-20 DIAGNOSIS — I50810 Right heart failure, unspecified: Secondary | ICD-10-CM | POA: Diagnosis not present

## 2022-04-20 DIAGNOSIS — S066X0D Traumatic subarachnoid hemorrhage without loss of consciousness, subsequent encounter: Secondary | ICD-10-CM | POA: Diagnosis not present

## 2022-04-20 DIAGNOSIS — Z9181 History of falling: Secondary | ICD-10-CM | POA: Diagnosis not present

## 2022-04-20 DIAGNOSIS — I080 Rheumatic disorders of both mitral and aortic valves: Secondary | ICD-10-CM | POA: Diagnosis not present

## 2022-04-20 DIAGNOSIS — N183 Chronic kidney disease, stage 3 unspecified: Secondary | ICD-10-CM | POA: Diagnosis not present

## 2022-04-20 DIAGNOSIS — I5022 Chronic systolic (congestive) heart failure: Secondary | ICD-10-CM | POA: Diagnosis not present

## 2022-04-24 DIAGNOSIS — I5022 Chronic systolic (congestive) heart failure: Secondary | ICD-10-CM | POA: Diagnosis not present

## 2022-04-24 DIAGNOSIS — N183 Chronic kidney disease, stage 3 unspecified: Secondary | ICD-10-CM | POA: Diagnosis not present

## 2022-04-24 DIAGNOSIS — Z9181 History of falling: Secondary | ICD-10-CM | POA: Diagnosis not present

## 2022-04-24 DIAGNOSIS — I50810 Right heart failure, unspecified: Secondary | ICD-10-CM | POA: Diagnosis not present

## 2022-04-24 DIAGNOSIS — I080 Rheumatic disorders of both mitral and aortic valves: Secondary | ICD-10-CM | POA: Diagnosis not present

## 2022-04-24 DIAGNOSIS — S066X0D Traumatic subarachnoid hemorrhage without loss of consciousness, subsequent encounter: Secondary | ICD-10-CM | POA: Diagnosis not present

## 2022-04-27 DIAGNOSIS — N183 Chronic kidney disease, stage 3 unspecified: Secondary | ICD-10-CM | POA: Diagnosis not present

## 2022-04-27 DIAGNOSIS — I080 Rheumatic disorders of both mitral and aortic valves: Secondary | ICD-10-CM | POA: Diagnosis not present

## 2022-04-27 DIAGNOSIS — S066X0D Traumatic subarachnoid hemorrhage without loss of consciousness, subsequent encounter: Secondary | ICD-10-CM | POA: Diagnosis not present

## 2022-04-27 DIAGNOSIS — Z9181 History of falling: Secondary | ICD-10-CM | POA: Diagnosis not present

## 2022-04-27 DIAGNOSIS — I5022 Chronic systolic (congestive) heart failure: Secondary | ICD-10-CM | POA: Diagnosis not present

## 2022-04-27 DIAGNOSIS — I50810 Right heart failure, unspecified: Secondary | ICD-10-CM | POA: Diagnosis not present

## 2022-05-01 DIAGNOSIS — N183 Chronic kidney disease, stage 3 unspecified: Secondary | ICD-10-CM | POA: Diagnosis not present

## 2022-05-01 DIAGNOSIS — I5022 Chronic systolic (congestive) heart failure: Secondary | ICD-10-CM | POA: Diagnosis not present

## 2022-05-01 DIAGNOSIS — S066X0D Traumatic subarachnoid hemorrhage without loss of consciousness, subsequent encounter: Secondary | ICD-10-CM | POA: Diagnosis not present

## 2022-05-01 DIAGNOSIS — Z9181 History of falling: Secondary | ICD-10-CM | POA: Diagnosis not present

## 2022-05-01 DIAGNOSIS — I50810 Right heart failure, unspecified: Secondary | ICD-10-CM | POA: Diagnosis not present

## 2022-05-01 DIAGNOSIS — I080 Rheumatic disorders of both mitral and aortic valves: Secondary | ICD-10-CM | POA: Diagnosis not present

## 2022-05-03 DIAGNOSIS — S066X0D Traumatic subarachnoid hemorrhage without loss of consciousness, subsequent encounter: Secondary | ICD-10-CM | POA: Diagnosis not present

## 2022-05-03 DIAGNOSIS — I080 Rheumatic disorders of both mitral and aortic valves: Secondary | ICD-10-CM | POA: Diagnosis not present

## 2022-05-03 DIAGNOSIS — Z9181 History of falling: Secondary | ICD-10-CM | POA: Diagnosis not present

## 2022-05-03 DIAGNOSIS — N183 Chronic kidney disease, stage 3 unspecified: Secondary | ICD-10-CM | POA: Diagnosis not present

## 2022-05-03 DIAGNOSIS — I50810 Right heart failure, unspecified: Secondary | ICD-10-CM | POA: Diagnosis not present

## 2022-05-03 DIAGNOSIS — I5022 Chronic systolic (congestive) heart failure: Secondary | ICD-10-CM | POA: Diagnosis not present

## 2022-05-08 DIAGNOSIS — I50810 Right heart failure, unspecified: Secondary | ICD-10-CM | POA: Diagnosis not present

## 2022-05-08 DIAGNOSIS — Z9181 History of falling: Secondary | ICD-10-CM | POA: Diagnosis not present

## 2022-05-08 DIAGNOSIS — I080 Rheumatic disorders of both mitral and aortic valves: Secondary | ICD-10-CM | POA: Diagnosis not present

## 2022-05-08 DIAGNOSIS — I5022 Chronic systolic (congestive) heart failure: Secondary | ICD-10-CM | POA: Diagnosis not present

## 2022-05-08 DIAGNOSIS — S066X0D Traumatic subarachnoid hemorrhage without loss of consciousness, subsequent encounter: Secondary | ICD-10-CM | POA: Diagnosis not present

## 2022-05-08 DIAGNOSIS — N183 Chronic kidney disease, stage 3 unspecified: Secondary | ICD-10-CM | POA: Diagnosis not present

## 2022-05-09 DIAGNOSIS — I50810 Right heart failure, unspecified: Secondary | ICD-10-CM | POA: Diagnosis not present

## 2022-05-09 DIAGNOSIS — I5022 Chronic systolic (congestive) heart failure: Secondary | ICD-10-CM | POA: Diagnosis not present

## 2022-05-09 DIAGNOSIS — I080 Rheumatic disorders of both mitral and aortic valves: Secondary | ICD-10-CM | POA: Diagnosis not present

## 2022-05-09 DIAGNOSIS — S066X0D Traumatic subarachnoid hemorrhage without loss of consciousness, subsequent encounter: Secondary | ICD-10-CM | POA: Diagnosis not present

## 2022-05-09 DIAGNOSIS — Z9181 History of falling: Secondary | ICD-10-CM | POA: Diagnosis not present

## 2022-05-09 DIAGNOSIS — N183 Chronic kidney disease, stage 3 unspecified: Secondary | ICD-10-CM | POA: Diagnosis not present

## 2022-05-11 DIAGNOSIS — I080 Rheumatic disorders of both mitral and aortic valves: Secondary | ICD-10-CM | POA: Diagnosis not present

## 2022-05-11 DIAGNOSIS — Z9181 History of falling: Secondary | ICD-10-CM | POA: Diagnosis not present

## 2022-05-11 DIAGNOSIS — N183 Chronic kidney disease, stage 3 unspecified: Secondary | ICD-10-CM | POA: Diagnosis not present

## 2022-05-11 DIAGNOSIS — I50810 Right heart failure, unspecified: Secondary | ICD-10-CM | POA: Diagnosis not present

## 2022-05-11 DIAGNOSIS — S066X0D Traumatic subarachnoid hemorrhage without loss of consciousness, subsequent encounter: Secondary | ICD-10-CM | POA: Diagnosis not present

## 2022-05-11 DIAGNOSIS — I5022 Chronic systolic (congestive) heart failure: Secondary | ICD-10-CM | POA: Diagnosis not present

## 2022-05-18 DIAGNOSIS — I080 Rheumatic disorders of both mitral and aortic valves: Secondary | ICD-10-CM | POA: Diagnosis not present

## 2022-05-18 DIAGNOSIS — R4189 Other symptoms and signs involving cognitive functions and awareness: Secondary | ICD-10-CM | POA: Diagnosis not present

## 2022-05-18 DIAGNOSIS — Z9181 History of falling: Secondary | ICD-10-CM | POA: Diagnosis not present

## 2022-05-18 DIAGNOSIS — D696 Thrombocytopenia, unspecified: Secondary | ICD-10-CM | POA: Diagnosis not present

## 2022-05-18 DIAGNOSIS — S32019D Unspecified fracture of first lumbar vertebra, subsequent encounter for fracture with routine healing: Secondary | ICD-10-CM | POA: Diagnosis not present

## 2022-05-18 DIAGNOSIS — S066X0D Traumatic subarachnoid hemorrhage without loss of consciousness, subsequent encounter: Secondary | ICD-10-CM | POA: Diagnosis not present

## 2022-05-18 DIAGNOSIS — S0292XD Unspecified fracture of facial bones, subsequent encounter for fracture with routine healing: Secondary | ICD-10-CM | POA: Diagnosis not present

## 2022-05-18 DIAGNOSIS — I50810 Right heart failure, unspecified: Secondary | ICD-10-CM | POA: Diagnosis not present

## 2022-05-18 DIAGNOSIS — N183 Chronic kidney disease, stage 3 unspecified: Secondary | ICD-10-CM | POA: Diagnosis not present

## 2022-05-18 DIAGNOSIS — I5022 Chronic systolic (congestive) heart failure: Secondary | ICD-10-CM | POA: Diagnosis not present

## 2022-05-18 DIAGNOSIS — S2241XD Multiple fractures of ribs, right side, subsequent encounter for fracture with routine healing: Secondary | ICD-10-CM | POA: Diagnosis not present

## 2022-05-18 DIAGNOSIS — M48061 Spinal stenosis, lumbar region without neurogenic claudication: Secondary | ICD-10-CM | POA: Diagnosis not present

## 2022-05-18 DIAGNOSIS — Z515 Encounter for palliative care: Secondary | ICD-10-CM | POA: Diagnosis not present

## 2022-05-18 DIAGNOSIS — E039 Hypothyroidism, unspecified: Secondary | ICD-10-CM | POA: Diagnosis not present

## 2022-05-22 NOTE — Telephone Encounter (Signed)
Formatting of this note might be different from the original.  9/5 pts wife called to cancel 9/6 appt due to pt being on hospice.-  Electronically signed by Milana Obey at 05/22/2022  8:27 AM MDT

## 2022-05-23 DIAGNOSIS — I080 Rheumatic disorders of both mitral and aortic valves: Secondary | ICD-10-CM | POA: Diagnosis not present

## 2022-05-23 DIAGNOSIS — I50810 Right heart failure, unspecified: Secondary | ICD-10-CM | POA: Diagnosis not present

## 2022-05-23 DIAGNOSIS — I5022 Chronic systolic (congestive) heart failure: Secondary | ICD-10-CM | POA: Diagnosis not present

## 2022-05-23 DIAGNOSIS — S066X0D Traumatic subarachnoid hemorrhage without loss of consciousness, subsequent encounter: Secondary | ICD-10-CM | POA: Diagnosis not present

## 2022-05-23 DIAGNOSIS — Z9181 History of falling: Secondary | ICD-10-CM | POA: Diagnosis not present

## 2022-05-23 DIAGNOSIS — N183 Chronic kidney disease, stage 3 unspecified: Secondary | ICD-10-CM | POA: Diagnosis not present

## 2022-05-25 DIAGNOSIS — S066X0D Traumatic subarachnoid hemorrhage without loss of consciousness, subsequent encounter: Secondary | ICD-10-CM | POA: Diagnosis not present

## 2022-05-25 DIAGNOSIS — I5022 Chronic systolic (congestive) heart failure: Secondary | ICD-10-CM | POA: Diagnosis not present

## 2022-05-25 DIAGNOSIS — I50810 Right heart failure, unspecified: Secondary | ICD-10-CM | POA: Diagnosis not present

## 2022-05-25 DIAGNOSIS — I080 Rheumatic disorders of both mitral and aortic valves: Secondary | ICD-10-CM | POA: Diagnosis not present

## 2022-05-25 DIAGNOSIS — N183 Chronic kidney disease, stage 3 unspecified: Secondary | ICD-10-CM | POA: Diagnosis not present

## 2022-05-25 DIAGNOSIS — Z9181 History of falling: Secondary | ICD-10-CM | POA: Diagnosis not present

## 2022-05-29 DIAGNOSIS — S066X0D Traumatic subarachnoid hemorrhage without loss of consciousness, subsequent encounter: Secondary | ICD-10-CM | POA: Diagnosis not present

## 2022-05-29 DIAGNOSIS — I50810 Right heart failure, unspecified: Secondary | ICD-10-CM | POA: Diagnosis not present

## 2022-05-29 DIAGNOSIS — Z9181 History of falling: Secondary | ICD-10-CM | POA: Diagnosis not present

## 2022-05-29 DIAGNOSIS — N183 Chronic kidney disease, stage 3 unspecified: Secondary | ICD-10-CM | POA: Diagnosis not present

## 2022-05-29 DIAGNOSIS — I5022 Chronic systolic (congestive) heart failure: Secondary | ICD-10-CM | POA: Diagnosis not present

## 2022-05-29 DIAGNOSIS — I080 Rheumatic disorders of both mitral and aortic valves: Secondary | ICD-10-CM | POA: Diagnosis not present

## 2022-06-01 DIAGNOSIS — I5022 Chronic systolic (congestive) heart failure: Secondary | ICD-10-CM | POA: Diagnosis not present

## 2022-06-01 DIAGNOSIS — Z9181 History of falling: Secondary | ICD-10-CM | POA: Diagnosis not present

## 2022-06-01 DIAGNOSIS — N183 Chronic kidney disease, stage 3 unspecified: Secondary | ICD-10-CM | POA: Diagnosis not present

## 2022-06-01 DIAGNOSIS — S066X0D Traumatic subarachnoid hemorrhage without loss of consciousness, subsequent encounter: Secondary | ICD-10-CM | POA: Diagnosis not present

## 2022-06-01 DIAGNOSIS — I50810 Right heart failure, unspecified: Secondary | ICD-10-CM | POA: Diagnosis not present

## 2022-06-01 DIAGNOSIS — I080 Rheumatic disorders of both mitral and aortic valves: Secondary | ICD-10-CM | POA: Diagnosis not present

## 2022-06-05 DIAGNOSIS — I5022 Chronic systolic (congestive) heart failure: Secondary | ICD-10-CM | POA: Diagnosis not present

## 2022-06-05 DIAGNOSIS — N183 Chronic kidney disease, stage 3 unspecified: Secondary | ICD-10-CM | POA: Diagnosis not present

## 2022-06-05 DIAGNOSIS — Z9181 History of falling: Secondary | ICD-10-CM | POA: Diagnosis not present

## 2022-06-05 DIAGNOSIS — I50810 Right heart failure, unspecified: Secondary | ICD-10-CM | POA: Diagnosis not present

## 2022-06-05 DIAGNOSIS — I080 Rheumatic disorders of both mitral and aortic valves: Secondary | ICD-10-CM | POA: Diagnosis not present

## 2022-06-05 DIAGNOSIS — S066X0D Traumatic subarachnoid hemorrhage without loss of consciousness, subsequent encounter: Secondary | ICD-10-CM | POA: Diagnosis not present

## 2022-06-06 DIAGNOSIS — I080 Rheumatic disorders of both mitral and aortic valves: Secondary | ICD-10-CM | POA: Diagnosis not present

## 2022-06-06 DIAGNOSIS — S066X0D Traumatic subarachnoid hemorrhage without loss of consciousness, subsequent encounter: Secondary | ICD-10-CM | POA: Diagnosis not present

## 2022-06-06 DIAGNOSIS — I50810 Right heart failure, unspecified: Secondary | ICD-10-CM | POA: Diagnosis not present

## 2022-06-06 DIAGNOSIS — Z9181 History of falling: Secondary | ICD-10-CM | POA: Diagnosis not present

## 2022-06-06 DIAGNOSIS — I5022 Chronic systolic (congestive) heart failure: Secondary | ICD-10-CM | POA: Diagnosis not present

## 2022-06-06 DIAGNOSIS — N183 Chronic kidney disease, stage 3 unspecified: Secondary | ICD-10-CM | POA: Diagnosis not present

## 2022-06-08 DIAGNOSIS — I50810 Right heart failure, unspecified: Secondary | ICD-10-CM | POA: Diagnosis not present

## 2022-06-08 DIAGNOSIS — Z9181 History of falling: Secondary | ICD-10-CM | POA: Diagnosis not present

## 2022-06-08 DIAGNOSIS — N183 Chronic kidney disease, stage 3 unspecified: Secondary | ICD-10-CM | POA: Diagnosis not present

## 2022-06-08 DIAGNOSIS — I080 Rheumatic disorders of both mitral and aortic valves: Secondary | ICD-10-CM | POA: Diagnosis not present

## 2022-06-08 DIAGNOSIS — S066X0D Traumatic subarachnoid hemorrhage without loss of consciousness, subsequent encounter: Secondary | ICD-10-CM | POA: Diagnosis not present

## 2022-06-08 DIAGNOSIS — I5022 Chronic systolic (congestive) heart failure: Secondary | ICD-10-CM | POA: Diagnosis not present

## 2022-06-11 DIAGNOSIS — N183 Chronic kidney disease, stage 3 unspecified: Secondary | ICD-10-CM | POA: Diagnosis not present

## 2022-06-11 DIAGNOSIS — I50810 Right heart failure, unspecified: Secondary | ICD-10-CM | POA: Diagnosis not present

## 2022-06-11 DIAGNOSIS — Z9181 History of falling: Secondary | ICD-10-CM | POA: Diagnosis not present

## 2022-06-11 DIAGNOSIS — I080 Rheumatic disorders of both mitral and aortic valves: Secondary | ICD-10-CM | POA: Diagnosis not present

## 2022-06-11 DIAGNOSIS — I5022 Chronic systolic (congestive) heart failure: Secondary | ICD-10-CM | POA: Diagnosis not present

## 2022-06-11 DIAGNOSIS — S066X0D Traumatic subarachnoid hemorrhage without loss of consciousness, subsequent encounter: Secondary | ICD-10-CM | POA: Diagnosis not present

## 2022-06-12 DIAGNOSIS — N183 Chronic kidney disease, stage 3 unspecified: Secondary | ICD-10-CM | POA: Diagnosis not present

## 2022-06-12 DIAGNOSIS — I5022 Chronic systolic (congestive) heart failure: Secondary | ICD-10-CM | POA: Diagnosis not present

## 2022-06-12 DIAGNOSIS — I50810 Right heart failure, unspecified: Secondary | ICD-10-CM | POA: Diagnosis not present

## 2022-06-12 DIAGNOSIS — S066X0D Traumatic subarachnoid hemorrhage without loss of consciousness, subsequent encounter: Secondary | ICD-10-CM | POA: Diagnosis not present

## 2022-06-12 DIAGNOSIS — Z9181 History of falling: Secondary | ICD-10-CM | POA: Diagnosis not present

## 2022-06-12 DIAGNOSIS — I080 Rheumatic disorders of both mitral and aortic valves: Secondary | ICD-10-CM | POA: Diagnosis not present

## 2022-06-13 DIAGNOSIS — N183 Chronic kidney disease, stage 3 unspecified: Secondary | ICD-10-CM | POA: Diagnosis not present

## 2022-06-13 DIAGNOSIS — Z9181 History of falling: Secondary | ICD-10-CM | POA: Diagnosis not present

## 2022-06-13 DIAGNOSIS — I5022 Chronic systolic (congestive) heart failure: Secondary | ICD-10-CM | POA: Diagnosis not present

## 2022-06-13 DIAGNOSIS — I080 Rheumatic disorders of both mitral and aortic valves: Secondary | ICD-10-CM | POA: Diagnosis not present

## 2022-06-13 DIAGNOSIS — I50810 Right heart failure, unspecified: Secondary | ICD-10-CM | POA: Diagnosis not present

## 2022-06-13 DIAGNOSIS — S066X0D Traumatic subarachnoid hemorrhage without loss of consciousness, subsequent encounter: Secondary | ICD-10-CM | POA: Diagnosis not present

## 2022-06-15 DIAGNOSIS — S066X0D Traumatic subarachnoid hemorrhage without loss of consciousness, subsequent encounter: Secondary | ICD-10-CM | POA: Diagnosis not present

## 2022-06-15 DIAGNOSIS — I5022 Chronic systolic (congestive) heart failure: Secondary | ICD-10-CM | POA: Diagnosis not present

## 2022-06-15 DIAGNOSIS — I080 Rheumatic disorders of both mitral and aortic valves: Secondary | ICD-10-CM | POA: Diagnosis not present

## 2022-06-15 DIAGNOSIS — I50810 Right heart failure, unspecified: Secondary | ICD-10-CM | POA: Diagnosis not present

## 2022-06-15 DIAGNOSIS — N183 Chronic kidney disease, stage 3 unspecified: Secondary | ICD-10-CM | POA: Diagnosis not present

## 2022-06-15 DIAGNOSIS — Z9181 History of falling: Secondary | ICD-10-CM | POA: Diagnosis not present

## 2022-06-17 DIAGNOSIS — I50810 Right heart failure, unspecified: Secondary | ICD-10-CM | POA: Diagnosis not present

## 2022-06-17 DIAGNOSIS — D696 Thrombocytopenia, unspecified: Secondary | ICD-10-CM | POA: Diagnosis not present

## 2022-06-17 DIAGNOSIS — Z9181 History of falling: Secondary | ICD-10-CM | POA: Diagnosis not present

## 2022-06-17 DIAGNOSIS — E039 Hypothyroidism, unspecified: Secondary | ICD-10-CM | POA: Diagnosis not present

## 2022-06-17 DIAGNOSIS — N183 Chronic kidney disease, stage 3 unspecified: Secondary | ICD-10-CM | POA: Diagnosis not present

## 2022-06-17 DIAGNOSIS — M48061 Spinal stenosis, lumbar region without neurogenic claudication: Secondary | ICD-10-CM | POA: Diagnosis not present

## 2022-06-17 DIAGNOSIS — S066X0D Traumatic subarachnoid hemorrhage without loss of consciousness, subsequent encounter: Secondary | ICD-10-CM | POA: Diagnosis not present

## 2022-06-17 DIAGNOSIS — I080 Rheumatic disorders of both mitral and aortic valves: Secondary | ICD-10-CM | POA: Diagnosis not present

## 2022-06-17 DIAGNOSIS — S2241XD Multiple fractures of ribs, right side, subsequent encounter for fracture with routine healing: Secondary | ICD-10-CM | POA: Diagnosis not present

## 2022-06-17 DIAGNOSIS — R4189 Other symptoms and signs involving cognitive functions and awareness: Secondary | ICD-10-CM | POA: Diagnosis not present

## 2022-06-17 DIAGNOSIS — S0292XD Unspecified fracture of facial bones, subsequent encounter for fracture with routine healing: Secondary | ICD-10-CM | POA: Diagnosis not present

## 2022-06-17 DIAGNOSIS — S32019D Unspecified fracture of first lumbar vertebra, subsequent encounter for fracture with routine healing: Secondary | ICD-10-CM | POA: Diagnosis not present

## 2022-06-17 DIAGNOSIS — I5022 Chronic systolic (congestive) heart failure: Secondary | ICD-10-CM | POA: Diagnosis not present

## 2022-06-17 DIAGNOSIS — Z515 Encounter for palliative care: Secondary | ICD-10-CM | POA: Diagnosis not present

## 2022-06-18 DIAGNOSIS — Z9181 History of falling: Secondary | ICD-10-CM | POA: Diagnosis not present

## 2022-06-18 DIAGNOSIS — I080 Rheumatic disorders of both mitral and aortic valves: Secondary | ICD-10-CM | POA: Diagnosis not present

## 2022-06-18 DIAGNOSIS — N183 Chronic kidney disease, stage 3 unspecified: Secondary | ICD-10-CM | POA: Diagnosis not present

## 2022-06-18 DIAGNOSIS — S066X0D Traumatic subarachnoid hemorrhage without loss of consciousness, subsequent encounter: Secondary | ICD-10-CM | POA: Diagnosis not present

## 2022-06-18 DIAGNOSIS — I5022 Chronic systolic (congestive) heart failure: Secondary | ICD-10-CM | POA: Diagnosis not present

## 2022-06-18 DIAGNOSIS — I50810 Right heart failure, unspecified: Secondary | ICD-10-CM | POA: Diagnosis not present

## 2022-06-19 DIAGNOSIS — I5022 Chronic systolic (congestive) heart failure: Secondary | ICD-10-CM | POA: Diagnosis not present

## 2022-06-19 DIAGNOSIS — Z9181 History of falling: Secondary | ICD-10-CM | POA: Diagnosis not present

## 2022-06-19 DIAGNOSIS — I080 Rheumatic disorders of both mitral and aortic valves: Secondary | ICD-10-CM | POA: Diagnosis not present

## 2022-06-19 DIAGNOSIS — S066X0D Traumatic subarachnoid hemorrhage without loss of consciousness, subsequent encounter: Secondary | ICD-10-CM | POA: Diagnosis not present

## 2022-06-19 DIAGNOSIS — N183 Chronic kidney disease, stage 3 unspecified: Secondary | ICD-10-CM | POA: Diagnosis not present

## 2022-06-19 DIAGNOSIS — I50810 Right heart failure, unspecified: Secondary | ICD-10-CM | POA: Diagnosis not present

## 2022-06-20 DIAGNOSIS — S066X0D Traumatic subarachnoid hemorrhage without loss of consciousness, subsequent encounter: Secondary | ICD-10-CM | POA: Diagnosis not present

## 2022-06-20 DIAGNOSIS — N183 Chronic kidney disease, stage 3 unspecified: Secondary | ICD-10-CM | POA: Diagnosis not present

## 2022-06-20 DIAGNOSIS — I50810 Right heart failure, unspecified: Secondary | ICD-10-CM | POA: Diagnosis not present

## 2022-06-20 DIAGNOSIS — I080 Rheumatic disorders of both mitral and aortic valves: Secondary | ICD-10-CM | POA: Diagnosis not present

## 2022-06-20 DIAGNOSIS — I5022 Chronic systolic (congestive) heart failure: Secondary | ICD-10-CM | POA: Diagnosis not present

## 2022-06-20 DIAGNOSIS — Z9181 History of falling: Secondary | ICD-10-CM | POA: Diagnosis not present

## 2022-06-21 ENCOUNTER — Other Ambulatory Visit: Payer: Self-pay | Admitting: Family Medicine

## 2022-06-22 DIAGNOSIS — Z9181 History of falling: Secondary | ICD-10-CM | POA: Diagnosis not present

## 2022-06-22 DIAGNOSIS — I50810 Right heart failure, unspecified: Secondary | ICD-10-CM | POA: Diagnosis not present

## 2022-06-22 DIAGNOSIS — S066X0D Traumatic subarachnoid hemorrhage without loss of consciousness, subsequent encounter: Secondary | ICD-10-CM | POA: Diagnosis not present

## 2022-06-22 DIAGNOSIS — I080 Rheumatic disorders of both mitral and aortic valves: Secondary | ICD-10-CM | POA: Diagnosis not present

## 2022-06-22 DIAGNOSIS — N183 Chronic kidney disease, stage 3 unspecified: Secondary | ICD-10-CM | POA: Diagnosis not present

## 2022-06-22 DIAGNOSIS — I5022 Chronic systolic (congestive) heart failure: Secondary | ICD-10-CM | POA: Diagnosis not present

## 2022-06-25 DIAGNOSIS — I080 Rheumatic disorders of both mitral and aortic valves: Secondary | ICD-10-CM | POA: Diagnosis not present

## 2022-06-25 DIAGNOSIS — I50810 Right heart failure, unspecified: Secondary | ICD-10-CM | POA: Diagnosis not present

## 2022-06-25 DIAGNOSIS — I5022 Chronic systolic (congestive) heart failure: Secondary | ICD-10-CM | POA: Diagnosis not present

## 2022-06-25 DIAGNOSIS — Z9181 History of falling: Secondary | ICD-10-CM | POA: Diagnosis not present

## 2022-06-25 DIAGNOSIS — S066X0D Traumatic subarachnoid hemorrhage without loss of consciousness, subsequent encounter: Secondary | ICD-10-CM | POA: Diagnosis not present

## 2022-06-25 DIAGNOSIS — N183 Chronic kidney disease, stage 3 unspecified: Secondary | ICD-10-CM | POA: Diagnosis not present

## 2022-06-27 DIAGNOSIS — I5022 Chronic systolic (congestive) heart failure: Secondary | ICD-10-CM | POA: Diagnosis not present

## 2022-06-27 DIAGNOSIS — I50810 Right heart failure, unspecified: Secondary | ICD-10-CM | POA: Diagnosis not present

## 2022-06-27 DIAGNOSIS — S066X0D Traumatic subarachnoid hemorrhage without loss of consciousness, subsequent encounter: Secondary | ICD-10-CM | POA: Diagnosis not present

## 2022-06-27 DIAGNOSIS — I080 Rheumatic disorders of both mitral and aortic valves: Secondary | ICD-10-CM | POA: Diagnosis not present

## 2022-06-27 DIAGNOSIS — N183 Chronic kidney disease, stage 3 unspecified: Secondary | ICD-10-CM | POA: Diagnosis not present

## 2022-06-27 DIAGNOSIS — Z9181 History of falling: Secondary | ICD-10-CM | POA: Diagnosis not present

## 2022-06-29 DIAGNOSIS — Z9181 History of falling: Secondary | ICD-10-CM | POA: Diagnosis not present

## 2022-06-29 DIAGNOSIS — I080 Rheumatic disorders of both mitral and aortic valves: Secondary | ICD-10-CM | POA: Diagnosis not present

## 2022-06-29 DIAGNOSIS — N183 Chronic kidney disease, stage 3 unspecified: Secondary | ICD-10-CM | POA: Diagnosis not present

## 2022-06-29 DIAGNOSIS — I50810 Right heart failure, unspecified: Secondary | ICD-10-CM | POA: Diagnosis not present

## 2022-06-29 DIAGNOSIS — I5022 Chronic systolic (congestive) heart failure: Secondary | ICD-10-CM | POA: Diagnosis not present

## 2022-06-29 DIAGNOSIS — S066X0D Traumatic subarachnoid hemorrhage without loss of consciousness, subsequent encounter: Secondary | ICD-10-CM | POA: Diagnosis not present

## 2022-07-02 DIAGNOSIS — I080 Rheumatic disorders of both mitral and aortic valves: Secondary | ICD-10-CM | POA: Diagnosis not present

## 2022-07-02 DIAGNOSIS — I50810 Right heart failure, unspecified: Secondary | ICD-10-CM | POA: Diagnosis not present

## 2022-07-02 DIAGNOSIS — Z9181 History of falling: Secondary | ICD-10-CM | POA: Diagnosis not present

## 2022-07-02 DIAGNOSIS — I5022 Chronic systolic (congestive) heart failure: Secondary | ICD-10-CM | POA: Diagnosis not present

## 2022-07-02 DIAGNOSIS — S066X0D Traumatic subarachnoid hemorrhage without loss of consciousness, subsequent encounter: Secondary | ICD-10-CM | POA: Diagnosis not present

## 2022-07-02 DIAGNOSIS — N183 Chronic kidney disease, stage 3 unspecified: Secondary | ICD-10-CM | POA: Diagnosis not present

## 2022-07-04 DIAGNOSIS — I080 Rheumatic disorders of both mitral and aortic valves: Secondary | ICD-10-CM | POA: Diagnosis not present

## 2022-07-04 DIAGNOSIS — I5022 Chronic systolic (congestive) heart failure: Secondary | ICD-10-CM | POA: Diagnosis not present

## 2022-07-04 DIAGNOSIS — Z9181 History of falling: Secondary | ICD-10-CM | POA: Diagnosis not present

## 2022-07-04 DIAGNOSIS — S066X0D Traumatic subarachnoid hemorrhage without loss of consciousness, subsequent encounter: Secondary | ICD-10-CM | POA: Diagnosis not present

## 2022-07-04 DIAGNOSIS — N183 Chronic kidney disease, stage 3 unspecified: Secondary | ICD-10-CM | POA: Diagnosis not present

## 2022-07-04 DIAGNOSIS — I50810 Right heart failure, unspecified: Secondary | ICD-10-CM | POA: Diagnosis not present

## 2022-07-06 DIAGNOSIS — N183 Chronic kidney disease, stage 3 unspecified: Secondary | ICD-10-CM | POA: Diagnosis not present

## 2022-07-06 DIAGNOSIS — Z9181 History of falling: Secondary | ICD-10-CM | POA: Diagnosis not present

## 2022-07-06 DIAGNOSIS — S066X0D Traumatic subarachnoid hemorrhage without loss of consciousness, subsequent encounter: Secondary | ICD-10-CM | POA: Diagnosis not present

## 2022-07-06 DIAGNOSIS — I080 Rheumatic disorders of both mitral and aortic valves: Secondary | ICD-10-CM | POA: Diagnosis not present

## 2022-07-06 DIAGNOSIS — I50810 Right heart failure, unspecified: Secondary | ICD-10-CM | POA: Diagnosis not present

## 2022-07-06 DIAGNOSIS — I5022 Chronic systolic (congestive) heart failure: Secondary | ICD-10-CM | POA: Diagnosis not present

## 2022-07-08 DIAGNOSIS — Z9181 History of falling: Secondary | ICD-10-CM | POA: Diagnosis not present

## 2022-07-08 DIAGNOSIS — I080 Rheumatic disorders of both mitral and aortic valves: Secondary | ICD-10-CM | POA: Diagnosis not present

## 2022-07-08 DIAGNOSIS — S066X0D Traumatic subarachnoid hemorrhage without loss of consciousness, subsequent encounter: Secondary | ICD-10-CM | POA: Diagnosis not present

## 2022-07-08 DIAGNOSIS — I50810 Right heart failure, unspecified: Secondary | ICD-10-CM | POA: Diagnosis not present

## 2022-07-08 DIAGNOSIS — I5022 Chronic systolic (congestive) heart failure: Secondary | ICD-10-CM | POA: Diagnosis not present

## 2022-07-08 DIAGNOSIS — N183 Chronic kidney disease, stage 3 unspecified: Secondary | ICD-10-CM | POA: Diagnosis not present

## 2022-07-09 DIAGNOSIS — I5022 Chronic systolic (congestive) heart failure: Secondary | ICD-10-CM | POA: Diagnosis not present

## 2022-07-09 DIAGNOSIS — N183 Chronic kidney disease, stage 3 unspecified: Secondary | ICD-10-CM | POA: Diagnosis not present

## 2022-07-09 DIAGNOSIS — I080 Rheumatic disorders of both mitral and aortic valves: Secondary | ICD-10-CM | POA: Diagnosis not present

## 2022-07-09 DIAGNOSIS — Z9181 History of falling: Secondary | ICD-10-CM | POA: Diagnosis not present

## 2022-07-09 DIAGNOSIS — S066X0D Traumatic subarachnoid hemorrhage without loss of consciousness, subsequent encounter: Secondary | ICD-10-CM | POA: Diagnosis not present

## 2022-07-09 DIAGNOSIS — I50810 Right heart failure, unspecified: Secondary | ICD-10-CM | POA: Diagnosis not present

## 2022-07-10 DIAGNOSIS — S066X0D Traumatic subarachnoid hemorrhage without loss of consciousness, subsequent encounter: Secondary | ICD-10-CM | POA: Diagnosis not present

## 2022-07-10 DIAGNOSIS — N183 Chronic kidney disease, stage 3 unspecified: Secondary | ICD-10-CM | POA: Diagnosis not present

## 2022-07-10 DIAGNOSIS — I5022 Chronic systolic (congestive) heart failure: Secondary | ICD-10-CM | POA: Diagnosis not present

## 2022-07-10 DIAGNOSIS — I080 Rheumatic disorders of both mitral and aortic valves: Secondary | ICD-10-CM | POA: Diagnosis not present

## 2022-07-10 DIAGNOSIS — I50810 Right heart failure, unspecified: Secondary | ICD-10-CM | POA: Diagnosis not present

## 2022-07-10 DIAGNOSIS — Z9181 History of falling: Secondary | ICD-10-CM | POA: Diagnosis not present

## 2022-07-11 DIAGNOSIS — S066X0D Traumatic subarachnoid hemorrhage without loss of consciousness, subsequent encounter: Secondary | ICD-10-CM | POA: Diagnosis not present

## 2022-07-11 DIAGNOSIS — I5022 Chronic systolic (congestive) heart failure: Secondary | ICD-10-CM | POA: Diagnosis not present

## 2022-07-11 DIAGNOSIS — I50810 Right heart failure, unspecified: Secondary | ICD-10-CM | POA: Diagnosis not present

## 2022-07-11 DIAGNOSIS — Z9181 History of falling: Secondary | ICD-10-CM | POA: Diagnosis not present

## 2022-07-11 DIAGNOSIS — I080 Rheumatic disorders of both mitral and aortic valves: Secondary | ICD-10-CM | POA: Diagnosis not present

## 2022-07-11 DIAGNOSIS — N183 Chronic kidney disease, stage 3 unspecified: Secondary | ICD-10-CM | POA: Diagnosis not present

## 2022-07-13 DIAGNOSIS — I5022 Chronic systolic (congestive) heart failure: Secondary | ICD-10-CM | POA: Diagnosis not present

## 2022-07-13 DIAGNOSIS — I50810 Right heart failure, unspecified: Secondary | ICD-10-CM | POA: Diagnosis not present

## 2022-07-13 DIAGNOSIS — I080 Rheumatic disorders of both mitral and aortic valves: Secondary | ICD-10-CM | POA: Diagnosis not present

## 2022-07-13 DIAGNOSIS — S066X0D Traumatic subarachnoid hemorrhage without loss of consciousness, subsequent encounter: Secondary | ICD-10-CM | POA: Diagnosis not present

## 2022-07-13 DIAGNOSIS — N183 Chronic kidney disease, stage 3 unspecified: Secondary | ICD-10-CM | POA: Diagnosis not present

## 2022-07-13 DIAGNOSIS — Z9181 History of falling: Secondary | ICD-10-CM | POA: Diagnosis not present

## 2022-07-16 ENCOUNTER — Inpatient Hospital Stay: Admit: 2022-07-16 | Discharge: 2022-07-17 | Disposition: A | Discharge status: Home / Self Care | Payer: MEDICARE

## 2022-07-16 DIAGNOSIS — R410 Disorientation, unspecified: Secondary | ICD-10-CM

## 2022-07-16 DIAGNOSIS — I499 Cardiac arrhythmia, unspecified: Secondary | ICD-10-CM | POA: Diagnosis not present

## 2022-07-16 DIAGNOSIS — Z743 Need for continuous supervision: Secondary | ICD-10-CM | POA: Diagnosis not present

## 2022-07-16 DIAGNOSIS — R531 Weakness: Secondary | ICD-10-CM | POA: Diagnosis not present

## 2022-07-16 DIAGNOSIS — F05 Delirium due to known physiological condition: Secondary | ICD-10-CM | POA: Diagnosis not present

## 2022-07-16 LAB — COMPREHENSIVE METABOLIC PANEL
ALT: 59 U/L (ref 12–78)
AST: 69 U/L — ABNORMAL HIGH (ref 15–37)
Albumin/Globulin Ratio: 0.6 — ABNORMAL LOW (ref 1.1–2.2)
Albumin: 2.4 g/dL — ABNORMAL LOW (ref 3.5–5.0)
Alk Phosphatase: 106 U/L (ref 45–117)
Anion Gap: 7 mmol/L (ref 5–15)
BUN: 58 MG/DL — ABNORMAL HIGH (ref 6–20)
Bun/Cre Ratio: 15 (ref 12–20)
CO2: 30 mmol/L (ref 21–32)
Calcium: 8.4 MG/DL — ABNORMAL LOW (ref 8.5–10.1)
Chloride: 99 mmol/L (ref 97–108)
Creatinine: 3.94 MG/DL — ABNORMAL HIGH (ref 0.70–1.30)
Est, Glom Filt Rate: 14 mL/min/{1.73_m2} — ABNORMAL LOW (ref >60)
Globulin: 4.3 g/dL — ABNORMAL HIGH (ref 2.0–4.0)
Glucose: 90 mg/dL (ref 65–100)
Potassium: 3.7 mmol/L (ref 3.5–5.1)
Sodium: 136 mmol/L (ref 136–145)
Total Bilirubin: 4 MG/DL — ABNORMAL HIGH (ref 0.2–1.0)
Total Protein: 6.7 g/dL (ref 6.4–8.2)

## 2022-07-16 LAB — CBC WITH AUTO DIFFERENTIAL
Absolute Immature Granulocyte: 0 10*3/uL (ref 0.00–0.04)
Basophils %: 1 % (ref 0–1)
Basophils Absolute: 0 10*3/uL (ref 0.0–0.1)
Eosinophils %: 0 % (ref 0–7)
Eosinophils Absolute: 0 10*3/uL (ref 0.0–0.4)
Hematocrit: 39.7 % (ref 36.6–50.3)
Hemoglobin: 13.1 g/dL (ref 12.1–17.0)
Immature Granulocytes: 1 % — ABNORMAL HIGH (ref 0.0–0.5)
Lymphocytes %: 16 % (ref 12–49)
Lymphocytes Absolute: 0.8 10*3/uL (ref 0.8–3.5)
MCH: 31.5 PG (ref 26.0–34.0)
MCHC: 33 g/dL (ref 30.0–36.5)
MCV: 95.4 FL (ref 80.0–99.0)
MPV: 11.3 FL (ref 8.9–12.9)
Monocytes %: 9 % (ref 5–13)
Monocytes Absolute: 0.4 10*3/uL (ref 0.0–1.0)
Neutrophils %: 73 % (ref 32–75)
Neutrophils Absolute: 3.5 10*3/uL (ref 1.8–8.0)
Nucleated RBCs: 0 PER 100 WBC
Platelets: 99 10*3/uL — ABNORMAL LOW (ref 150–400)
RBC: 4.16 M/uL (ref 4.10–5.70)
RDW: 19.4 % — ABNORMAL HIGH (ref 11.5–14.5)
WBC: 4.7 10*3/uL (ref 4.1–11.1)
nRBC: 0 10*3/uL (ref 0.00–0.01)

## 2022-07-16 LAB — TROPONIN: Troponin, High Sensitivity: 421 ng/L (ref 0–76)

## 2022-07-16 LAB — EXTRA TUBES HOLD

## 2022-07-16 LAB — BRAIN NATRIURETIC PEPTIDE: NT Pro-BNP: >35000 PG/ML — ABNORMAL HIGH (ref <450)

## 2022-07-16 MED ORDER — QUETIAPINE FUMARATE 25 MG PO TABS
25 MG | ORAL_TABLET | Freq: Every evening | ORAL | 0 refills | Status: AC | PRN
Start: 2022-07-16 — End: ?

## 2022-07-16 NOTE — ED Provider Notes (Incomplete)
[facility] EMERGENCY DEPT  EMERGENCY DEPARTMENT ENCOUNTER      Pt Name: Mason Fields  MRN: [id number]  Birthdate [birth year]  Date of evaluation: 07/16/2022  Provider: Mosie Epstein, MD    CHIEF COMPLAINT       Chief Complaint   Patient presents with    Irregular Heart Beat    Altered Mental Status         HISTORY OF PRESENT ILLNESS   (Location/Symptom, Timing/Onset, Context/Setting, Quality, Duration, Modifying Factors, Severity)  Note limiting factors.   88 year old male with a history of heart failure, CKD, A-fib, squamous of carcinoma who presents via EMS from home.  Patient is currently on home hospice.  Family was concerned because they were trying to get the patient back in the bed, felt that he was unresponsive and called 911.  They have medications at home for symptom management but has not been using them.  They state his sleep cycle has been off, has been getting up in the middle the night.  They report difficulty with keeping the patient asleep at night.  Currently there is no family at bedside.  Will speak with him when they are available.  The patient has no complaints.    The history is provided by the patient and the EMS personnel.         Review of External Medical Records:     Nursing Notes were reviewed.    REVIEW OF SYSTEMS    (2-9 systems for level 4, 10 or more for level 5)     Review of Systems    Except as noted above the remainder of the review of systems was reviewed and negative.       PAST MEDICAL HISTORY     Past Medical History:   Diagnosis Date    A-fib (HCC)     CHF (congestive heart failure), NYHA class II, chronic, systolic (HCC)     EF 25%    Chronic anticoagulation     CKD (chronic kidney disease), stage III (HCC)     Hypercholesteremia     Hypothyroid     Right atrial dilation     Right heart failure (HCC)          SURGICAL HISTORY       Past Surgical History:   Procedure Laterality Date    CORONARY ANGIOPLASTY WITH STENT PLACEMENT      10 years ago         CURRENT  MEDICATIONS       Previous Medications    No medications on file       ALLERGIES     Patient has no known allergies.    FAMILY HISTORY     No family history on file.       SOCIAL HISTORY       Social History     Socioeconomic History    Marital status: Married   Tobacco Use    Smoking status: Never    Smokeless tobacco: Never   Substance and Sexual Activity    Drug use: Never           PHYSICAL EXAM    (up to 7 for level 4, 8 or more for level 5)     ED Triage Vitals [07/16/22 1250]   BP Temp Temp Source Pulse Respirations SpO2 Height Weight   90/61 97.5 F (36.4 C) Oral (!) 107 16 94 % 1.702 m (5\' 7" ) --       Body mass  index is 28.35 kg/m.    Physical Exam    DIAGNOSTIC RESULTS     EKG: All EKG's are interpreted by the Emergency Department Physician who either signs or Co-signs this chart in the absence of a cardiologist.        RADIOLOGY:   Non-plain film images such as CT, Ultrasound and MRI are read by the radiologist. Plain radiographic images are visualized and preliminarily interpreted by the emergency physician with the below findings:        Interpretation per the Radiologist below, if available at the time of this note:    No orders to display        LABS:  Labs Reviewed   TROPONIN - Abnormal; Notable for the following components:       Result Value    Troponin, High Sensitivity 421 (*)     All other components within normal limits   BRAIN NATRIURETIC PEPTIDE - Abnormal; Notable for the following components:    NT Pro-BNP >35,000 (*)     All other components within normal limits   CBC WITH AUTO DIFFERENTIAL - Abnormal; Notable for the following components:    RDW 19.4 (*)     Platelets 99 (*)     Immature Granulocytes 1 (*)     All other components within normal limits   COMPREHENSIVE METABOLIC PANEL - Abnormal; Notable for the following components:    BUN 58 (*)     Creatinine 3.94 (*)     Est, Glom Filt Rate 14 (*)     Calcium 8.4 (*)     Total Bilirubin 4.0 (*)     AST 69 (*)     Albumin 2.4 (*)      Globulin 4.3 (*)     Albumin/Globulin Ratio 0.6 (*)     All other components within normal limits   EXTRA TUBES HOLD       All other labs were within normal range or not returned as of this dictation.    EMERGENCY DEPARTMENT COURSE and DIFFERENTIAL DIAGNOSIS/MDM:   Vitals:    Vitals:    07/16/22 1500 07/16/22 1515 07/16/22 1530 07/16/22 1545   BP: 103/77 109/82 98/67 90/66    Pulse: (!) 112 (!) 106 (!) 121 (!) 119   Resp: 13 22 17 15    Temp:       TempSrc:       SpO2:       Height:               Medical Decision Making                440pm  Spoke with the patient's daughter and wife.  We discussed management of symptoms at home including using the prescribed morphine, Ativan and Haldol for symptom management.  I also offered to inquire about inpatient hospice.  Unfortunately patient does not meet requirements for inpatient hospice.  We will discuss this with the family.  Will discharge home with instructions to use these medications to help with his symptoms.  Offered to prescribe him a dose of Seroquel to take at night prior to bed to help with sleep so the patient does not intend to get up.    Amount and/or Complexity of Data Reviewed  Labs: ordered.  ECG/medicine tests: ordered.            REASSESSMENT            CONSULTS:  None    PROCEDURES:  Unless otherwise noted  below, none     Procedures      FINAL IMPRESSION    No diagnosis found.      DISPOSITION/PLAN   DISPOSITION        PATIENT REFERRED TO:  No follow-up provider specified.    DISCHARGE MEDICATIONS:  New Prescriptions    No medications on file         (Please note that portions of this note were completed with a voice recognition program.  Efforts were made to edit the dictations but occasionally words are mis-transcribed.)    Mosie Epstein, MD (electronically signed)  Emergency Attending Physician / Physician Assistant / Nurse Practitioner

## 2022-07-16 NOTE — ED Notes (Signed)
Case Management informed primary RN that patient is on hospice at home.      Franco Nones, RN  07/16/22 1558

## 2022-07-16 NOTE — ED Notes (Signed)
Report given to [facility] as well as paperwork. Discharge instructions reviewed with patient who verbalized understanding.      Caprice Red, RN  07/16/22 2136

## 2022-07-16 NOTE — Care Coordination-Inpatient (Signed)
CARE MANAGEMENT NOTE      CM received a call from Jardine with [facility] reporting that Pt is a home hospice Pt of [facility]. Per Judson Roch, admitting hospice dx is CHF. CM updated RN and ER provider.     _____________________________________  Alejandro Mulling, [place] Management   Available via [facility]  07/16/2022   4:23 PM

## 2022-07-16 NOTE — ED Triage Notes (Signed)
Pt arrives via EMS from home for complaints of weakness and disorientation. EMS states that patient is typically able to take care of himself but reports that today patient had leaned up against a piece of furniture and was unable to stand back up by himself.     EMS reports that wife stated on scene that patient has seemed confused over the last few days. Pt currently oriented to self, place and time.     Pt has wounds to left side of face on arrival to ER, pt unsure of hoe he got them.     Pt also has bilateral leg swelling.

## 2022-07-16 NOTE — ED Notes (Signed)
Received a call from [facility] the patients hospice nurse- she reports if the patient and his wife decides to be discharged and continue hospice that they will resume hospice care. Patient wife to call hospice with an update if they do decide to return home. Primary RN and Dr Zenovia Jordan made aware.     Candis Schatz, RN  07/16/22 [phone]

## 2022-07-17 DIAGNOSIS — I50810 Right heart failure, unspecified: Secondary | ICD-10-CM | POA: Diagnosis not present

## 2022-07-17 DIAGNOSIS — S066X0D Traumatic subarachnoid hemorrhage without loss of consciousness, subsequent encounter: Secondary | ICD-10-CM | POA: Diagnosis not present

## 2022-07-17 DIAGNOSIS — N183 Chronic kidney disease, stage 3 unspecified: Secondary | ICD-10-CM | POA: Diagnosis not present

## 2022-07-17 DIAGNOSIS — I5022 Chronic systolic (congestive) heart failure: Secondary | ICD-10-CM | POA: Diagnosis not present

## 2022-07-17 DIAGNOSIS — Z9181 History of falling: Secondary | ICD-10-CM | POA: Diagnosis not present

## 2022-07-17 DIAGNOSIS — I080 Rheumatic disorders of both mitral and aortic valves: Secondary | ICD-10-CM | POA: Diagnosis not present

## 2022-07-17 LAB — EKG 12-LEAD
Q-T Interval: 358 ms
QRS Duration: 94 ms
QTc Calculation (Bazett): 486 ms
R Axis: -14 degrees
T Axis: 196 degrees
Ventricular Rate: 111 {beats}/min

## 2022-07-18 DIAGNOSIS — I080 Rheumatic disorders of both mitral and aortic valves: Secondary | ICD-10-CM | POA: Diagnosis not present

## 2022-07-18 DIAGNOSIS — S2241XD Multiple fractures of ribs, right side, subsequent encounter for fracture with routine healing: Secondary | ICD-10-CM | POA: Diagnosis not present

## 2022-07-18 DIAGNOSIS — Z9181 History of falling: Secondary | ICD-10-CM | POA: Diagnosis not present

## 2022-07-18 DIAGNOSIS — N183 Chronic kidney disease, stage 3 unspecified: Secondary | ICD-10-CM | POA: Diagnosis not present

## 2022-07-18 DIAGNOSIS — S0292XD Unspecified fracture of facial bones, subsequent encounter for fracture with routine healing: Secondary | ICD-10-CM | POA: Diagnosis not present

## 2022-07-18 DIAGNOSIS — M48061 Spinal stenosis, lumbar region without neurogenic claudication: Secondary | ICD-10-CM | POA: Diagnosis not present

## 2022-07-18 DIAGNOSIS — Z515 Encounter for palliative care: Secondary | ICD-10-CM | POA: Diagnosis not present

## 2022-07-18 DIAGNOSIS — R4189 Other symptoms and signs involving cognitive functions and awareness: Secondary | ICD-10-CM | POA: Diagnosis not present

## 2022-07-18 DIAGNOSIS — S32019D Unspecified fracture of first lumbar vertebra, subsequent encounter for fracture with routine healing: Secondary | ICD-10-CM | POA: Diagnosis not present

## 2022-07-18 DIAGNOSIS — I5022 Chronic systolic (congestive) heart failure: Secondary | ICD-10-CM | POA: Diagnosis not present

## 2022-07-18 DIAGNOSIS — E039 Hypothyroidism, unspecified: Secondary | ICD-10-CM | POA: Diagnosis not present

## 2022-07-18 DIAGNOSIS — S066X0D Traumatic subarachnoid hemorrhage without loss of consciousness, subsequent encounter: Secondary | ICD-10-CM | POA: Diagnosis not present

## 2022-07-18 DIAGNOSIS — D696 Thrombocytopenia, unspecified: Secondary | ICD-10-CM | POA: Diagnosis not present

## 2022-07-18 DIAGNOSIS — I50810 Right heart failure, unspecified: Secondary | ICD-10-CM | POA: Diagnosis not present

## 2022-07-19 DIAGNOSIS — I080 Rheumatic disorders of both mitral and aortic valves: Secondary | ICD-10-CM | POA: Diagnosis not present

## 2022-07-19 DIAGNOSIS — I5022 Chronic systolic (congestive) heart failure: Secondary | ICD-10-CM | POA: Diagnosis not present

## 2022-07-19 DIAGNOSIS — I50810 Right heart failure, unspecified: Secondary | ICD-10-CM | POA: Diagnosis not present

## 2022-07-19 DIAGNOSIS — N183 Chronic kidney disease, stage 3 unspecified: Secondary | ICD-10-CM | POA: Diagnosis not present

## 2022-07-19 DIAGNOSIS — S066X0D Traumatic subarachnoid hemorrhage without loss of consciousness, subsequent encounter: Secondary | ICD-10-CM | POA: Diagnosis not present

## 2022-07-19 DIAGNOSIS — Z9181 History of falling: Secondary | ICD-10-CM | POA: Diagnosis not present

## 2022-07-20 DIAGNOSIS — I080 Rheumatic disorders of both mitral and aortic valves: Secondary | ICD-10-CM | POA: Diagnosis not present

## 2022-07-20 DIAGNOSIS — Z9181 History of falling: Secondary | ICD-10-CM | POA: Diagnosis not present

## 2022-07-20 DIAGNOSIS — I50810 Right heart failure, unspecified: Secondary | ICD-10-CM | POA: Diagnosis not present

## 2022-07-20 DIAGNOSIS — S066X0D Traumatic subarachnoid hemorrhage without loss of consciousness, subsequent encounter: Secondary | ICD-10-CM | POA: Diagnosis not present

## 2022-07-20 DIAGNOSIS — N183 Chronic kidney disease, stage 3 unspecified: Secondary | ICD-10-CM | POA: Diagnosis not present

## 2022-07-20 DIAGNOSIS — I5022 Chronic systolic (congestive) heart failure: Secondary | ICD-10-CM | POA: Diagnosis not present

## 2022-07-21 DIAGNOSIS — I50810 Right heart failure, unspecified: Secondary | ICD-10-CM | POA: Diagnosis not present

## 2022-07-21 DIAGNOSIS — Z9181 History of falling: Secondary | ICD-10-CM | POA: Diagnosis not present

## 2022-07-21 DIAGNOSIS — I5022 Chronic systolic (congestive) heart failure: Secondary | ICD-10-CM | POA: Diagnosis not present

## 2022-07-21 DIAGNOSIS — I080 Rheumatic disorders of both mitral and aortic valves: Secondary | ICD-10-CM | POA: Diagnosis not present

## 2022-07-21 DIAGNOSIS — S066X0D Traumatic subarachnoid hemorrhage without loss of consciousness, subsequent encounter: Secondary | ICD-10-CM | POA: Diagnosis not present

## 2022-07-21 DIAGNOSIS — N183 Chronic kidney disease, stage 3 unspecified: Secondary | ICD-10-CM | POA: Diagnosis not present

## 2022-07-22 DIAGNOSIS — Z9181 History of falling: Secondary | ICD-10-CM | POA: Diagnosis not present

## 2022-07-22 DIAGNOSIS — I5022 Chronic systolic (congestive) heart failure: Secondary | ICD-10-CM | POA: Diagnosis not present

## 2022-07-22 DIAGNOSIS — N183 Chronic kidney disease, stage 3 unspecified: Secondary | ICD-10-CM | POA: Diagnosis not present

## 2022-07-22 DIAGNOSIS — I080 Rheumatic disorders of both mitral and aortic valves: Secondary | ICD-10-CM | POA: Diagnosis not present

## 2022-07-22 DIAGNOSIS — I50810 Right heart failure, unspecified: Secondary | ICD-10-CM | POA: Diagnosis not present

## 2022-07-22 DIAGNOSIS — S066X0D Traumatic subarachnoid hemorrhage without loss of consciousness, subsequent encounter: Secondary | ICD-10-CM | POA: Diagnosis not present

## 2022-07-23 ENCOUNTER — Telehealth: Payer: Self-pay | Admitting: Licensed Clinical Social Worker

## 2022-07-23 DIAGNOSIS — S066X0D Traumatic subarachnoid hemorrhage without loss of consciousness, subsequent encounter: Secondary | ICD-10-CM | POA: Diagnosis not present

## 2022-07-23 DIAGNOSIS — I5022 Chronic systolic (congestive) heart failure: Secondary | ICD-10-CM | POA: Diagnosis not present

## 2022-07-23 DIAGNOSIS — N183 Chronic kidney disease, stage 3 unspecified: Secondary | ICD-10-CM | POA: Diagnosis not present

## 2022-07-23 DIAGNOSIS — I080 Rheumatic disorders of both mitral and aortic valves: Secondary | ICD-10-CM | POA: Diagnosis not present

## 2022-07-23 DIAGNOSIS — I50810 Right heart failure, unspecified: Secondary | ICD-10-CM | POA: Diagnosis not present

## 2022-07-23 DIAGNOSIS — Z9181 History of falling: Secondary | ICD-10-CM | POA: Diagnosis not present

## 2022-07-23 NOTE — Patient Outreach (Signed)
  Care Coordination   07/23/2022 Name: Ricardo Valencia MRN: [id number] DOB: [birth year]   Care Coordination Outreach Attempts:  An unsuccessful telephone outreach was attempted today to offer the patient information about available care coordination services as a benefit of their health plan. The numbers on file are not in service. LCSW was unable to leave a message for a return call  Follow Up Plan:  No further outreach attempts will be made at this time. We have been unable to contact the patient to offer or enroll patient in care coordination services  Encounter Outcome:  No Answer  Care Coordination Interventions Activated:  No   Care Coordination Interventions:  No, not indicated    Christa See, MSW, [email or web address] Phone [phone] 1:29 PM

## 2022-07-24 DIAGNOSIS — Z9181 History of falling: Secondary | ICD-10-CM | POA: Diagnosis not present

## 2022-07-24 DIAGNOSIS — I5022 Chronic systolic (congestive) heart failure: Secondary | ICD-10-CM | POA: Diagnosis not present

## 2022-07-24 DIAGNOSIS — S066X0D Traumatic subarachnoid hemorrhage without loss of consciousness, subsequent encounter: Secondary | ICD-10-CM | POA: Diagnosis not present

## 2022-07-24 DIAGNOSIS — I080 Rheumatic disorders of both mitral and aortic valves: Secondary | ICD-10-CM | POA: Diagnosis not present

## 2022-07-24 DIAGNOSIS — N183 Chronic kidney disease, stage 3 unspecified: Secondary | ICD-10-CM | POA: Diagnosis not present

## 2022-07-24 DIAGNOSIS — I50810 Right heart failure, unspecified: Secondary | ICD-10-CM | POA: Diagnosis not present

## 2022-07-25 DIAGNOSIS — I080 Rheumatic disorders of both mitral and aortic valves: Secondary | ICD-10-CM | POA: Diagnosis not present

## 2022-07-25 DIAGNOSIS — N183 Chronic kidney disease, stage 3 unspecified: Secondary | ICD-10-CM | POA: Diagnosis not present

## 2022-07-25 DIAGNOSIS — I50810 Right heart failure, unspecified: Secondary | ICD-10-CM | POA: Diagnosis not present

## 2022-07-25 DIAGNOSIS — S066X0D Traumatic subarachnoid hemorrhage without loss of consciousness, subsequent encounter: Secondary | ICD-10-CM | POA: Diagnosis not present

## 2022-07-25 DIAGNOSIS — Z9181 History of falling: Secondary | ICD-10-CM | POA: Diagnosis not present

## 2022-07-25 DIAGNOSIS — I5022 Chronic systolic (congestive) heart failure: Secondary | ICD-10-CM | POA: Diagnosis not present

## 2022-07-26 DIAGNOSIS — I080 Rheumatic disorders of both mitral and aortic valves: Secondary | ICD-10-CM | POA: Diagnosis not present

## 2022-07-26 DIAGNOSIS — Z9181 History of falling: Secondary | ICD-10-CM | POA: Diagnosis not present

## 2022-07-26 DIAGNOSIS — I50810 Right heart failure, unspecified: Secondary | ICD-10-CM | POA: Diagnosis not present

## 2022-07-26 DIAGNOSIS — N183 Chronic kidney disease, stage 3 unspecified: Secondary | ICD-10-CM | POA: Diagnosis not present

## 2022-07-26 DIAGNOSIS — S066X0D Traumatic subarachnoid hemorrhage without loss of consciousness, subsequent encounter: Secondary | ICD-10-CM | POA: Diagnosis not present

## 2022-07-26 DIAGNOSIS — I5022 Chronic systolic (congestive) heart failure: Secondary | ICD-10-CM | POA: Diagnosis not present

## 2022-07-27 DIAGNOSIS — N183 Chronic kidney disease, stage 3 unspecified: Secondary | ICD-10-CM | POA: Diagnosis not present

## 2022-07-27 DIAGNOSIS — I50810 Right heart failure, unspecified: Secondary | ICD-10-CM | POA: Diagnosis not present

## 2022-07-27 DIAGNOSIS — I080 Rheumatic disorders of both mitral and aortic valves: Secondary | ICD-10-CM | POA: Diagnosis not present

## 2022-07-27 DIAGNOSIS — S066X0D Traumatic subarachnoid hemorrhage without loss of consciousness, subsequent encounter: Secondary | ICD-10-CM | POA: Diagnosis not present

## 2022-07-27 DIAGNOSIS — I5022 Chronic systolic (congestive) heart failure: Secondary | ICD-10-CM | POA: Diagnosis not present

## 2022-07-27 DIAGNOSIS — Z9181 History of falling: Secondary | ICD-10-CM | POA: Diagnosis not present

## 2022-08-17 DEATH — deceased
